# Patient Record
Sex: Male | Born: 1940
Health system: Southern US, Community
[De-identification: ages and names within clinical notes are randomized; demographics above are authoritative.]

## PROBLEM LIST (undated history)

## (undated) DIAGNOSIS — E785 Hyperlipidemia, unspecified: Secondary | ICD-10-CM

## (undated) DIAGNOSIS — I2510 Atherosclerotic heart disease of native coronary artery without angina pectoris: Secondary | ICD-10-CM

## (undated) DIAGNOSIS — Z9221 Personal history of antineoplastic chemotherapy: Secondary | ICD-10-CM

## (undated) DIAGNOSIS — Z9289 Personal history of other medical treatment: Secondary | ICD-10-CM

## (undated) DIAGNOSIS — C3490 Malignant neoplasm of unspecified part of unspecified bronchus or lung: Secondary | ICD-10-CM

## (undated) DIAGNOSIS — Z923 Personal history of irradiation: Secondary | ICD-10-CM

## (undated) DIAGNOSIS — J439 Emphysema, unspecified: Secondary | ICD-10-CM

## (undated) DIAGNOSIS — Z8551 Personal history of malignant neoplasm of bladder: Secondary | ICD-10-CM

## (undated) DIAGNOSIS — M1990 Unspecified osteoarthritis, unspecified site: Secondary | ICD-10-CM

## (undated) HISTORY — PX: OTHER SURGICAL HISTORY: SHX169

## (undated) HISTORY — PX: SHOULDER SURGERY: SHX246

## (undated) HISTORY — DX: Emphysema, unspecified: J43.9

## (undated) HISTORY — PX: MINOR HEMORRHOIDECTOMY: SHX6238

---

## 2003-11-16 DIAGNOSIS — C4492 Squamous cell carcinoma of skin, unspecified: Secondary | ICD-10-CM

## 2003-11-16 HISTORY — DX: Squamous cell carcinoma of skin, unspecified: C44.92

## 2004-05-28 DIAGNOSIS — C4492 Squamous cell carcinoma of skin, unspecified: Secondary | ICD-10-CM

## 2004-05-28 HISTORY — DX: Squamous cell carcinoma of skin, unspecified: C44.92

## 2006-05-13 ENCOUNTER — Encounter (INDEPENDENT_AMBULATORY_CARE_PROVIDER_SITE_OTHER): Payer: Self-pay | Admitting: *Deleted

## 2006-05-13 ENCOUNTER — Ambulatory Visit (HOSPITAL_BASED_OUTPATIENT_CLINIC_OR_DEPARTMENT_OTHER): Admission: RE | Admit: 2006-05-13 | Discharge: 2006-05-13 | Payer: Self-pay | Admitting: Orthopedic Surgery

## 2009-09-26 ENCOUNTER — Encounter: Admission: RE | Admit: 2009-09-26 | Discharge: 2009-09-26 | Payer: Self-pay | Admitting: Internal Medicine

## 2010-05-04 ENCOUNTER — Encounter: Payer: Self-pay | Admitting: Internal Medicine

## 2010-08-29 NOTE — Op Note (Signed)
NAME:  Kevin [name], Kevin [name]              ACCOUNT NO.:  [id number]   MEDICAL RECORD NO.:  [id number]          PATIENT TYPE:  AMB   LOCATION:  DSC                          FACILITY:  [facility]   PHYSICIAN:  Katy Fitch. Sypher, M.D. DATE OF BIRTH:  10-Nov-1940   DATE OF PROCEDURE:  05/13/2006  DATE OF DISCHARGE:                               OPERATIVE REPORT   PREOPERATIVE DIAGNOSIS:  Progressive Dupuytren's contracture involving  left hand in the palm, in the pretendinous fibers to the small finger  and extensive nodular disease of the small finger, the thumb-index web  space and pretendinous fibers to the index finger.   POSTOPERATIVE DIAGNOSIS:  Progressive Dupuytren's contracture involving  left hand in the palm, in the pretendinous fibers to the small finger  and extensive nodular disease of the small finger, the thumb-index web  space and pretendinous fibers to the index finger.   OPERATION:  1. Resection of complex Dupuytren's contracture from left palm and      small finger.  2. Resection of Dupuytren's contracture from left thumb-index webspace      with release of the index finger pretendinous fibers.   OPERATING SURGEON:  Katy Fitch. Sypher, M.D.   ASSISTANT:  Annye Rusk, P.A.-C.   ANESTHESIA:  General by LMA.   SUPERVISING ANESTHESIOLOGIST:  Zenon Mayo, M.D.   INDICATIONS:  Kevin [name] is a 70 year old right-hand-dominant  gentleman referred by Dr. Celine Mans of [place], [place].   He has a history of progressive Dupuytren's palmar fibromatosis  affecting both hands, left worse than right.  He had an extensive  involvement of the pretendinous fibers and nodular disease of the left  palm and small finger and a thumb-index web space contracture with  increased tension in the natatory ligaments and the pretendinous fibers  to his left index finger.   He sought a Hand Surgery consult and requested resection of his  pathologic fascia.   Preoperatively, he was  carefully advised that this is a palliative  surgery.  We do not have the ability to control the biology of  Dupuytren's disease with surgery.   The understands he may have difficult keloid scars following surgery,  could have a sympathetic dystrophy or regional pain-type syndrome  response and may require prolonged therapy.  He understands he will need  to wear a splint for 8 weeks minimum postoperatively while sleeping  after surgery.  Preoperatively, questions were invited and answered.  He  is brought to the operating at this time.   PROCEDURE:  Kevin [name] was brought to the operating room and placed  in a supine position upon the operating table.   Following the induction of general anesthesia by LMA technique, the left  arm was prepped with Betadine soap and solution and sterilely draped.  One gram of Ancef was provided as a prophylactic antibiotic  preoperatively in the holding area.   The left arm was prepped with Betadine soap and solution and sterilely  draped.  Following exsanguination of the left arm with an Esmarch  bandage, an arterial tourniquet on the proximal brachium was inflated to  240  mmHg.  The procedure commenced with planning a Brunner zigzag  incision in the palm and small finger and in the thumb-index webspace, a  transverse incision was fashioned at the midsection of the web.   The skin incisions were taken sharply and skin flaps elevated,  separating the dermis from the pathologic fascia.   After elevation of skin flaps, the complex palmar fibromatosis pathology  was removed from the palm and small finger including resection of the  pretendinous fibers, a lateral fascial sheet on the ulnar aspect of the  small finger, nodular disease and a spiral band that was retrovascular  on the ulnar aspect of the small finger and a central cord distally.  The natatory ligaments were resected between the small finger and ring  finger.   After the fascia was  resected, the MP joint could be hyperextended 30  degrees and the PIP and DIP joint extended to neutral.  There were no  apparent complications.  There were no impairments of the flexor sheath  noted.   Hemostasis was achieved with bipolar cautery and direct compression.  Attention was then directed to the thumb-index webspace.  A transverse  incision was fashioned at the web space, followed by elevation of skin  flaps.  The natatory ligaments were resected and the pretendinous fibers  to the index finger were released.  This relieved the thumb-index web  space contracture.   The wound was then inspected for bleeding points, which were  electrocauterized with bipolar current followed by repair of the skin  with an intradermal 4-0 Prolene at the thumb-index web space and corner  sutures and mattress sutures of 5-0 nylon in the small finger and palm.   There were no apparent complications.   Kevin [name] tolerated surgery and anesthesia well.  He was awakened from  his anesthesia and transferred to the recovery room with stable signs.  He will be discharged with prescriptions for Percocet 5 mg one or two  tablets p.o. q.4-6 h. p.r.n. pain, 30 tablets without refill, also  Keflex 500 mg one p.o. q.8 h. for 4 days as a prophylactic antibiotic.     Dr. Jiles Harold I [place].      Katy Fitch Sypher, M.D.  Electronically Signed     RVS/MEDQ  D:  05/13/2006  T:  05/13/2006  Job:  [id number]   cc:   Jonita Albee, [place] Dr. Celine Mans

## 2012-04-13 HISTORY — PX: TRANSURETHRAL RESECTION OF BLADDER TUMOR: SHX2575

## 2013-09-13 ENCOUNTER — Other Ambulatory Visit: Payer: Self-pay | Admitting: Dermatology

## 2013-12-08 ENCOUNTER — Encounter (INDEPENDENT_AMBULATORY_CARE_PROVIDER_SITE_OTHER): Payer: Self-pay | Admitting: *Deleted

## 2013-12-12 ENCOUNTER — Encounter (INDEPENDENT_AMBULATORY_CARE_PROVIDER_SITE_OTHER): Payer: Self-pay

## 2013-12-13 ENCOUNTER — Telehealth (INDEPENDENT_AMBULATORY_CARE_PROVIDER_SITE_OTHER): Payer: Self-pay | Admitting: *Deleted

## 2013-12-13 NOTE — Telephone Encounter (Signed)
If he is not having any symptoms, he can delay next exam until 06/02/2016

## 2013-12-13 NOTE — Telephone Encounter (Signed)
Patient last TCS was 12/2006, had tubular adenoma removed (both are scnnaed in EPIC)-- he was on my recall and I mailed him letter asking him to call to schedule repeat TCS, patient states Dr Woody Seller told him he wouldn't need one for 10 years after that one -- tried to explain to patient with that type of polyp he needed to have it repeated in 5-7 years --  He wants you to review this again and let him know when he is really due -- very difficult to talk to

## 2013-12-14 NOTE — Telephone Encounter (Signed)
Spoke to patient, he isn't having any problems at this time and wants to put off to 2018, advised if he developes issues to contact his PCP

## 2016-08-04 ENCOUNTER — Ambulatory Visit (INDEPENDENT_AMBULATORY_CARE_PROVIDER_SITE_OTHER): Payer: Medicare Other | Admitting: Urology

## 2016-08-04 DIAGNOSIS — C678 Malignant neoplasm of overlapping sites of bladder: Secondary | ICD-10-CM

## 2016-08-04 DIAGNOSIS — N401 Enlarged prostate with lower urinary tract symptoms: Secondary | ICD-10-CM

## 2016-10-06 ENCOUNTER — Other Ambulatory Visit: Payer: Self-pay | Admitting: Dermatology

## 2016-11-18 ENCOUNTER — Other Ambulatory Visit: Payer: Self-pay | Admitting: Internal Medicine

## 2016-11-18 DIAGNOSIS — G8929 Other chronic pain: Secondary | ICD-10-CM

## 2016-11-18 DIAGNOSIS — M545 Low back pain: Principal | ICD-10-CM

## 2016-11-25 ENCOUNTER — Ambulatory Visit
Admission: RE | Admit: 2016-11-25 | Discharge: 2016-11-25 | Disposition: A | Discharge status: Home / Self Care | Payer: Medicare Other | Source: Ambulatory Visit | Attending: Internal Medicine | Admitting: Internal Medicine

## 2016-11-25 ENCOUNTER — Other Ambulatory Visit: Payer: Self-pay | Admitting: Internal Medicine

## 2016-11-25 DIAGNOSIS — M545 Low back pain: Principal | ICD-10-CM

## 2016-11-25 DIAGNOSIS — G8929 Other chronic pain: Secondary | ICD-10-CM

## 2016-11-25 MED ORDER — METHYLPREDNISOLONE ACETATE 40 MG/ML INJ SUSP (RADIOLOG
120.0000 mg | Freq: Once | INTRAMUSCULAR | Status: AC
  Administered 2016-11-25: 120 mg via EPIDURAL

## 2016-11-25 MED ORDER — IOPAMIDOL (ISOVUE-M 200) INJECTION 41%
1.0000 mL | Freq: Once | INTRAMUSCULAR | Status: AC
  Administered 2016-11-25: 1 mL via EPIDURAL

## 2016-11-25 NOTE — Discharge Instructions (Signed)

## 2016-12-04 ENCOUNTER — Encounter (INDEPENDENT_AMBULATORY_CARE_PROVIDER_SITE_OTHER): Payer: Self-pay | Admitting: *Deleted

## 2017-02-02 ENCOUNTER — Ambulatory Visit (INDEPENDENT_AMBULATORY_CARE_PROVIDER_SITE_OTHER): Payer: Medicare Other | Admitting: Urology

## 2017-02-02 DIAGNOSIS — C678 Malignant neoplasm of overlapping sites of bladder: Secondary | ICD-10-CM | POA: Diagnosis not present

## 2017-02-02 DIAGNOSIS — R351 Nocturia: Secondary | ICD-10-CM | POA: Diagnosis not present

## 2017-02-02 DIAGNOSIS — N401 Enlarged prostate with lower urinary tract symptoms: Secondary | ICD-10-CM

## 2017-04-26 DIAGNOSIS — R69 Illness, unspecified: Secondary | ICD-10-CM | POA: Diagnosis not present

## 2017-05-10 DIAGNOSIS — M159 Polyosteoarthritis, unspecified: Secondary | ICD-10-CM | POA: Diagnosis not present

## 2017-05-10 DIAGNOSIS — E7800 Pure hypercholesterolemia, unspecified: Secondary | ICD-10-CM | POA: Diagnosis not present

## 2017-06-09 DIAGNOSIS — E7800 Pure hypercholesterolemia, unspecified: Secondary | ICD-10-CM | POA: Diagnosis not present

## 2017-06-09 DIAGNOSIS — M159 Polyosteoarthritis, unspecified: Secondary | ICD-10-CM | POA: Diagnosis not present

## 2017-06-24 DIAGNOSIS — E7800 Pure hypercholesterolemia, unspecified: Secondary | ICD-10-CM | POA: Diagnosis not present

## 2017-06-24 DIAGNOSIS — Z1331 Encounter for screening for depression: Secondary | ICD-10-CM | POA: Diagnosis not present

## 2017-06-24 DIAGNOSIS — Z0000 Encounter for general adult medical examination without abnormal findings: Secondary | ICD-10-CM | POA: Diagnosis not present

## 2017-06-24 DIAGNOSIS — C679 Malignant neoplasm of bladder, unspecified: Secondary | ICD-10-CM | POA: Diagnosis not present

## 2017-06-24 DIAGNOSIS — Z6828 Body mass index (BMI) 28.0-28.9, adult: Secondary | ICD-10-CM | POA: Diagnosis not present

## 2017-06-24 DIAGNOSIS — Z1211 Encounter for screening for malignant neoplasm of colon: Secondary | ICD-10-CM | POA: Diagnosis not present

## 2017-06-24 DIAGNOSIS — Z79899 Other long term (current) drug therapy: Secondary | ICD-10-CM | POA: Diagnosis not present

## 2017-06-24 DIAGNOSIS — Z1339 Encounter for screening examination for other mental health and behavioral disorders: Secondary | ICD-10-CM | POA: Diagnosis not present

## 2017-06-24 DIAGNOSIS — Z299 Encounter for prophylactic measures, unspecified: Secondary | ICD-10-CM | POA: Diagnosis not present

## 2017-06-24 DIAGNOSIS — N400 Enlarged prostate without lower urinary tract symptoms: Secondary | ICD-10-CM | POA: Diagnosis not present

## 2017-06-24 DIAGNOSIS — Z7189 Other specified counseling: Secondary | ICD-10-CM | POA: Diagnosis not present

## 2017-06-24 DIAGNOSIS — R5383 Other fatigue: Secondary | ICD-10-CM | POA: Diagnosis not present

## 2017-06-28 DIAGNOSIS — Z87891 Personal history of nicotine dependence: Secondary | ICD-10-CM | POA: Diagnosis not present

## 2017-06-28 DIAGNOSIS — N400 Enlarged prostate without lower urinary tract symptoms: Secondary | ICD-10-CM | POA: Diagnosis not present

## 2017-06-28 DIAGNOSIS — H6120 Impacted cerumen, unspecified ear: Secondary | ICD-10-CM | POA: Diagnosis not present

## 2017-06-28 DIAGNOSIS — Z299 Encounter for prophylactic measures, unspecified: Secondary | ICD-10-CM | POA: Diagnosis not present

## 2017-06-28 DIAGNOSIS — Z6828 Body mass index (BMI) 28.0-28.9, adult: Secondary | ICD-10-CM | POA: Diagnosis not present

## 2017-07-05 DIAGNOSIS — H6120 Impacted cerumen, unspecified ear: Secondary | ICD-10-CM | POA: Diagnosis not present

## 2017-07-05 DIAGNOSIS — Z6828 Body mass index (BMI) 28.0-28.9, adult: Secondary | ICD-10-CM | POA: Diagnosis not present

## 2017-07-05 DIAGNOSIS — Z299 Encounter for prophylactic measures, unspecified: Secondary | ICD-10-CM | POA: Diagnosis not present

## 2017-07-20 ENCOUNTER — Ambulatory Visit (INDEPENDENT_AMBULATORY_CARE_PROVIDER_SITE_OTHER): Payer: Medicare HMO | Admitting: Urology

## 2017-07-20 DIAGNOSIS — R351 Nocturia: Secondary | ICD-10-CM

## 2017-07-20 DIAGNOSIS — N401 Enlarged prostate with lower urinary tract symptoms: Secondary | ICD-10-CM

## 2017-07-20 DIAGNOSIS — C678 Malignant neoplasm of overlapping sites of bladder: Secondary | ICD-10-CM | POA: Diagnosis not present

## 2017-08-03 DIAGNOSIS — M159 Polyosteoarthritis, unspecified: Secondary | ICD-10-CM | POA: Diagnosis not present

## 2017-08-03 DIAGNOSIS — E7800 Pure hypercholesterolemia, unspecified: Secondary | ICD-10-CM | POA: Diagnosis not present

## 2017-08-30 DIAGNOSIS — N401 Enlarged prostate with lower urinary tract symptoms: Secondary | ICD-10-CM | POA: Diagnosis not present

## 2017-08-30 DIAGNOSIS — R351 Nocturia: Secondary | ICD-10-CM | POA: Diagnosis not present

## 2017-09-07 DIAGNOSIS — M159 Polyosteoarthritis, unspecified: Secondary | ICD-10-CM | POA: Diagnosis not present

## 2017-09-07 DIAGNOSIS — E7800 Pure hypercholesterolemia, unspecified: Secondary | ICD-10-CM | POA: Diagnosis not present

## 2017-09-20 DIAGNOSIS — L821 Other seborrheic keratosis: Secondary | ICD-10-CM | POA: Diagnosis not present

## 2017-09-20 DIAGNOSIS — D229 Melanocytic nevi, unspecified: Secondary | ICD-10-CM | POA: Diagnosis not present

## 2017-10-08 DIAGNOSIS — E7800 Pure hypercholesterolemia, unspecified: Secondary | ICD-10-CM | POA: Diagnosis not present

## 2017-10-08 DIAGNOSIS — M159 Polyosteoarthritis, unspecified: Secondary | ICD-10-CM | POA: Diagnosis not present

## 2017-10-12 ENCOUNTER — Ambulatory Visit (INDEPENDENT_AMBULATORY_CARE_PROVIDER_SITE_OTHER): Payer: Medicare HMO | Admitting: Urology

## 2017-10-12 DIAGNOSIS — N400 Enlarged prostate without lower urinary tract symptoms: Secondary | ICD-10-CM

## 2017-11-05 DIAGNOSIS — M159 Polyosteoarthritis, unspecified: Secondary | ICD-10-CM | POA: Diagnosis not present

## 2017-11-05 DIAGNOSIS — E7800 Pure hypercholesterolemia, unspecified: Secondary | ICD-10-CM | POA: Diagnosis not present

## 2017-11-08 DIAGNOSIS — E7800 Pure hypercholesterolemia, unspecified: Secondary | ICD-10-CM | POA: Diagnosis not present

## 2017-11-08 DIAGNOSIS — Z6827 Body mass index (BMI) 27.0-27.9, adult: Secondary | ICD-10-CM | POA: Diagnosis not present

## 2017-11-08 DIAGNOSIS — Z299 Encounter for prophylactic measures, unspecified: Secondary | ICD-10-CM | POA: Diagnosis not present

## 2017-11-08 DIAGNOSIS — Z713 Dietary counseling and surveillance: Secondary | ICD-10-CM | POA: Diagnosis not present

## 2017-11-08 DIAGNOSIS — C679 Malignant neoplasm of bladder, unspecified: Secondary | ICD-10-CM | POA: Diagnosis not present

## 2017-12-02 DIAGNOSIS — E7800 Pure hypercholesterolemia, unspecified: Secondary | ICD-10-CM | POA: Diagnosis not present

## 2017-12-02 DIAGNOSIS — M159 Polyosteoarthritis, unspecified: Secondary | ICD-10-CM | POA: Diagnosis not present

## 2017-12-09 DIAGNOSIS — Z713 Dietary counseling and surveillance: Secondary | ICD-10-CM | POA: Diagnosis not present

## 2017-12-09 DIAGNOSIS — M79672 Pain in left foot: Secondary | ICD-10-CM | POA: Diagnosis not present

## 2017-12-09 DIAGNOSIS — Z299 Encounter for prophylactic measures, unspecified: Secondary | ICD-10-CM | POA: Diagnosis not present

## 2017-12-09 DIAGNOSIS — Z6827 Body mass index (BMI) 27.0-27.9, adult: Secondary | ICD-10-CM | POA: Diagnosis not present

## 2017-12-28 DIAGNOSIS — M159 Polyosteoarthritis, unspecified: Secondary | ICD-10-CM | POA: Diagnosis not present

## 2017-12-28 DIAGNOSIS — E7800 Pure hypercholesterolemia, unspecified: Secondary | ICD-10-CM | POA: Diagnosis not present

## 2018-01-17 DIAGNOSIS — R69 Illness, unspecified: Secondary | ICD-10-CM | POA: Diagnosis not present

## 2018-01-28 DIAGNOSIS — E7800 Pure hypercholesterolemia, unspecified: Secondary | ICD-10-CM | POA: Diagnosis not present

## 2018-01-28 DIAGNOSIS — M159 Polyosteoarthritis, unspecified: Secondary | ICD-10-CM | POA: Diagnosis not present

## 2018-02-03 DIAGNOSIS — M549 Dorsalgia, unspecified: Secondary | ICD-10-CM | POA: Diagnosis not present

## 2018-02-03 DIAGNOSIS — Z299 Encounter for prophylactic measures, unspecified: Secondary | ICD-10-CM | POA: Diagnosis not present

## 2018-02-03 DIAGNOSIS — Z713 Dietary counseling and surveillance: Secondary | ICD-10-CM | POA: Diagnosis not present

## 2018-02-03 DIAGNOSIS — Z6828 Body mass index (BMI) 28.0-28.9, adult: Secondary | ICD-10-CM | POA: Diagnosis not present

## 2018-03-08 DIAGNOSIS — E7800 Pure hypercholesterolemia, unspecified: Secondary | ICD-10-CM | POA: Diagnosis not present

## 2018-03-08 DIAGNOSIS — M159 Polyosteoarthritis, unspecified: Secondary | ICD-10-CM | POA: Diagnosis not present

## 2018-04-07 DIAGNOSIS — E7800 Pure hypercholesterolemia, unspecified: Secondary | ICD-10-CM | POA: Diagnosis not present

## 2018-04-07 DIAGNOSIS — M159 Polyosteoarthritis, unspecified: Secondary | ICD-10-CM | POA: Diagnosis not present

## 2018-04-14 DIAGNOSIS — Z6827 Body mass index (BMI) 27.0-27.9, adult: Secondary | ICD-10-CM | POA: Diagnosis not present

## 2018-04-14 DIAGNOSIS — J320 Chronic maxillary sinusitis: Secondary | ICD-10-CM | POA: Diagnosis not present

## 2018-04-14 DIAGNOSIS — Z299 Encounter for prophylactic measures, unspecified: Secondary | ICD-10-CM | POA: Diagnosis not present

## 2018-04-26 ENCOUNTER — Ambulatory Visit (INDEPENDENT_AMBULATORY_CARE_PROVIDER_SITE_OTHER): Payer: Medicare HMO | Admitting: Urology

## 2018-04-26 DIAGNOSIS — N401 Enlarged prostate with lower urinary tract symptoms: Secondary | ICD-10-CM | POA: Diagnosis not present

## 2018-04-26 DIAGNOSIS — C679 Malignant neoplasm of bladder, unspecified: Secondary | ICD-10-CM | POA: Diagnosis not present

## 2018-04-26 DIAGNOSIS — R351 Nocturia: Secondary | ICD-10-CM | POA: Diagnosis not present

## 2018-05-05 DIAGNOSIS — M159 Polyosteoarthritis, unspecified: Secondary | ICD-10-CM | POA: Diagnosis not present

## 2018-05-05 DIAGNOSIS — E7800 Pure hypercholesterolemia, unspecified: Secondary | ICD-10-CM | POA: Diagnosis not present

## 2018-05-09 DIAGNOSIS — J309 Allergic rhinitis, unspecified: Secondary | ICD-10-CM | POA: Diagnosis not present

## 2018-05-09 DIAGNOSIS — Z299 Encounter for prophylactic measures, unspecified: Secondary | ICD-10-CM | POA: Diagnosis not present

## 2018-05-09 DIAGNOSIS — Z87891 Personal history of nicotine dependence: Secondary | ICD-10-CM | POA: Diagnosis not present

## 2018-05-09 DIAGNOSIS — E7800 Pure hypercholesterolemia, unspecified: Secondary | ICD-10-CM | POA: Diagnosis not present

## 2018-05-09 DIAGNOSIS — Z6827 Body mass index (BMI) 27.0-27.9, adult: Secondary | ICD-10-CM | POA: Diagnosis not present

## 2018-05-09 DIAGNOSIS — C679 Malignant neoplasm of bladder, unspecified: Secondary | ICD-10-CM | POA: Diagnosis not present

## 2018-05-11 ENCOUNTER — Other Ambulatory Visit: Payer: Self-pay | Admitting: Urology

## 2018-05-13 NOTE — Patient Instructions (Signed)
Your procedure is scheduled on: 05/19/2018  Report to [facility] at    6;30 AM.  Call this number if you have problems the morning of surgery: [phone]   Remember:   Do not drink or eat food:After Midnight.  :  Take these medicines the morning of surgery with A SIP OF WATER: Claritin   Do not wear jewelry, make-up or nail polish.  Do not wear lotions, powders, or perfumes. You may wear deodorant.  Do not shave 48 hours prior to surgery. Men may shave face and neck.  Do not bring valuables to the hospital.  Contacts, dentures or bridgework may not be worn into surgery.  Leave suitcase in the car. After surgery it may be brought to your room.  For patients admitted to the hospital, checkout time is 11:00 AM the day of discharge.   Patients discharged the day of surgery will not be allowed to drive home.    Special Instructions: Shower using CHG night before surgery and shower the day of surgery use CHG.  Use special wash - you have one bottle of CHG for all showers.  You should use approximately 1/2 of the bottle for each shower.  Transurethral Resection of Bladder Tumor  Transurethral resection of a bladder tumor is the removal (resection) of a cancerous growth (tumor) on the inside wall of the bladder. The bladder is the organ that holds urine. The tumor is removed through the tube that carries urine out of the body (urethra). In a transurethral resection, a thin telescope with a light, a tiny camera, and an electric cutting edge (resectoscope) is passed through the urethra. In men, the opening of the urethra is at the end of the penis. In women, it is just above the opening of the vagina. Tell a health care provider about:  Any allergies you have.  All medicines you are taking, including vitamins, herbs, eye drops, creams, and over-the-counter medicines.  Any problems you or family members have had with anesthetic medicines.  Any blood disorders you have.  Any surgeries you  have had.  Any medical conditions you have.  Any recent urinary tract infections you have had.  Whether you are pregnant or may be pregnant. What are the risks? Generally, this is a safe procedure. However, problems may occur, including:  Infection.  Bleeding.  Allergic reactions to medicines.  Damage to nearby structures or organs, such as: ? The urethra. ? The tubes that drain urine from the kidneys into the bladder (ureters).  Pain and burning during urination.  Difficulty urinating due to partial blockage of the urethra.  Inability to urinate (urinary retention). What happens before the procedure? Staying hydrated Follow instructions from your health care provider about hydration, which may include:  Up to 2 hours before the procedure - you may continue to drink clear liquids, such as water, clear fruit juice, black coffee, and plain tea.  Eating and drinking restrictions Follow instructions from your health care provider about eating and drinking, which may include:  8 hours before the procedure - stop eating heavy meals or foods, such as meat, fried foods, or fatty foods.  6 hours before the procedure - stop eating light meals or foods, such as toast or cereal.  6 hours before the procedure - stop drinking milk or drinks that contain milk.  2 hours before the procedure - stop drinking clear liquids. Medicines Ask your health care provider about:  Changing or stopping your regular medicines. This is especially  important if you are taking diabetes medicines or blood thinners.  Taking medicines such as aspirin and ibuprofen. These medicines can thin your blood. Do not take these medicines unless your health care provider tells you to take them.  Taking over-the-counter medicines, vitamins, herbs, and supplements. Tests You may have exams or tests, including:  Physical exam.  Blood tests.  Urine tests.  Electrocardiogram (ECG). This test measures the  electrical activity of the heart. General instructions  Plan to have someone take you home from the hospital or clinic.  Ask your health care provider how your surgical site will be marked or identified.  Ask your health care provider what steps will be taken to help prevent infection. These may include: ? Washing skin with a germ-killing soap. ? Taking antibiotic medicine. What happens during the procedure?  An IV will be inserted into one of your veins.  You will be given one or more of the following: ? A medicine to help you relax (sedative). ? A medicine to make you fall asleep (general anesthetic). ? A medicine that is injected into your spine to numb the area below and slightly above the injection site (spinal anesthetic).  Your legs will be placed in foot rests (stirrups) so that your legs are apart and your knees are bent.  The resectoscope will be passed through your urethra and into your bladder.  The part of your bladder that is affected by the tumor will be resected using the cutting edge of the resectoscope.  The resectoscope will be removed.  A thin, flexible tube (catheter) will be passed through your urethra and into your bladder. The catheter will drain urine into a bag outside of your body. ? Fluid may be passed through the catheter to keep the catheter open. The procedure may vary among health care providers and hospitals. What happens after the procedure?  Your blood pressure, heart rate, breathing rate, and blood oxygen level will be monitored until you leave the hospital or clinic.  You may continue to receive fluids and medicines through an IV.  You will have some pain. You will be given pain medicine to relieve pain.  You will have a catheter to drain your urine. ? You will have blood in your urine. Your catheter may be kept in until your urine is clear. ? The amount of urine will be monitored. If necessary, your bladder may be rinsed out (irrigated) by  passing fluid through your catheter.  You will be encouraged to walk around as soon as possible.  You may have to wear compression stockings. These stockings help to prevent blood clots and reduce swelling in your legs.  Do not drive for 24 hours if you were given a sedative during your procedure. Summary  Transurethral resection of a bladder tumor is the removal (resection) of a cancerous growth (tumor) on the inside wall of the bladder.  To do this procedure, your health care provider uses a thin telescope with a light, a tiny camera, and an electric cutting edge (resectoscope).  Follow your health care provider's instructions. You may need to stop or change certain medicines, and you may be told to stop eating and drinking several hours before the procedure.  Your blood pressure, heart rate, breathing rate, and blood oxygen level will be monitored until you leave the hospital or clinic.  You may have to wear compression stockings. These stockings help to prevent blood clots and reduce swelling in your legs. This information is not intended  to replace advice given to you by your health care provider. Make sure you discuss any questions you have with your health care provider. Document Released: 01/24/2009 Document Revised: 10/29/2017 Document Reviewed: 10/29/2017 Elsevier Interactive Patient Education  2019 [employer]  Transurethral Resection of Bladder Tumor, Care After This sheet gives you information about how to care for yourself after your procedure. Your health care provider may also give you more specific instructions. If you have problems or questions, contact your health care provider. What can I expect after the procedure? After the procedure, it is common to have:  A small amount of blood in your urine for up to 2 weeks.  Soreness or mild pain from your catheter. After your catheter is removed, you may have mild soreness, especially when urinating.  Pain in your lower  abdomen. Follow these instructions at home: Medicines   Take over-the-counter and prescription medicines only as told by your health care provider.  If you were prescribed an antibiotic medicine, take it as told by your health care provider. Do not stop taking the antibiotic even if you start to feel better.  Do not drive for 24 hours if you were given a sedative during your procedure.  Ask your health care provider if the medicine prescribed to you: ? Requires you to avoid driving or using heavy machinery. ? Can cause constipation. You may need to take these actions to prevent or treat constipation:  Take over-the-counter or prescription medicines.  Eat foods that are high in fiber, such as beans, whole grains, and fresh fruits and vegetables.  Limit foods that are high in fat and processed sugars, such as fried or sweet foods. Activity  Return to your normal activities as told by your health care provider. Ask your health care provider what activities are safe for you.  Do not lift anything that is heavier than 10 lb (4.5 kg), or the limit that you are told, until your health care provider says that it is safe.  Avoid intense physical activity for as long as told by your health care provider.  Rest as told by your health care provider.  Avoid sitting for a long time without moving. Get up to take short walks every 1-2 hours. This is important to improve blood flow and breathing. Ask for help if you feel weak or unsteady. General instructions   Do not drink alcohol for as long as told by your health care provider. This is especially important if you are taking prescription pain medicines.  Do not take baths, swim, or use a hot tub until your health care provider approves. Ask your health care provider if you may take showers. You may only be allowed to take sponge baths.  If you have a catheter, follow instructions from your health care provider about caring for your catheter  and your drainage bag.  Drink enough fluid to keep your urine pale yellow.  Wear compression stockings as told by your health care provider. These stockings help to prevent blood clots and reduce swelling in your legs.  Keep all follow-up visits as told by your health care provider. This is important. ? You will need to be followed closely with regular checks of your bladder and urethra (cystoscopies) to make sure that the cancer does not come back. Contact a health care provider if:  You have pain that gets worse or does not improve with medicine.  You have blood in your urine for more than 2 weeks.  You  have cloudy or bad-smelling urine.  You become constipated. Signs of constipation may include having: ? Fewer than three bowel movements in a week. ? Difficulty having a bowel movement. ? Stools that are dry, hard, or larger than normal.  You have a fever. Get help right away if:  You have: ? Severe pain. ? Bright red blood in your urine. ? Blood clots in your urine. ? A lot of blood in your urine.  Your catheter has been removed and you are not able to urinate.  You have a catheter in place and the catheter is not draining urine. Summary  After your procedure, it is common to have a small amount of blood in your urine, soreness or mild pain from your catheter, and pain in your lower abdomen.  Take over-the-counter and prescription medicines only as told by your health care provider.  Rest as told by your health care provider. Follow your health care provider's instructions about returning to normal activities. Ask what activities are safe for you.  If you have a catheter, follow instructions from your health care provider about caring for your catheter and your drainage bag.  Get help right away if you cannot urinate, you have severe pain, or you have bright red blood or blood clots in your urine. This information is not intended to replace advice given to you by your  health care provider. Make sure you discuss any questions you have with your health care provider. Document Released: 03/11/2015 Document Revised: 10/28/2017 Document Reviewed: 10/28/2017  General Anesthesia, Adult, Care After This sheet gives you information about how to care for yourself after your procedure. Your health care provider may also give you more specific instructions. If you have problems or questions, contact your health care provider. What can I expect after the procedure? After the procedure, the following side effects are common:  Pain or discomfort at the IV site.  Nausea.  Vomiting.  Sore throat.  Trouble concentrating.  Feeling cold or chills.  Weak or tired.  Sleepiness and fatigue.  Soreness and body aches. These side effects can affect parts of the body that were not involved in surgery. Follow these instructions at home:  For at least 24 hours after the procedure:  Have a responsible adult stay with you. It is important to have someone help care for you until you are awake and alert.  Rest as needed.  Do not: ? Participate in activities in which you could fall or become injured. ? Drive. ? Use heavy machinery. ? Drink alcohol. ? Take sleeping pills or medicines that cause drowsiness. ? Make important decisions or sign legal documents. ? Take care of children on your own. Eating and drinking  Follow any instructions from your health care provider about eating or drinking restrictions.  When you feel hungry, start by eating small amounts of foods that are soft and easy to digest (bland), such as toast. Gradually return to your regular diet.  Drink enough fluid to keep your urine pale yellow.  If you vomit, rehydrate by drinking water, juice, or clear broth. General instructions  If you have sleep apnea, surgery and certain medicines can increase your risk for breathing problems. Follow instructions from your health care provider about wearing  your sleep device: ? Anytime you are sleeping, including during daytime naps. ? While taking prescription pain medicines, sleeping medicines, or medicines that make you drowsy.  Return to your normal activities as told by your health care provider. Ask your  health care provider what activities are safe for you.  Take over-the-counter and prescription medicines only as told by your health care provider.  If you smoke, do not smoke without supervision.  Keep all follow-up visits as told by your health care provider. This is important. Contact a health care provider if:  You have nausea or vomiting that does not get better with medicine.  You cannot eat or drink without vomiting.  You have pain that does not get better with medicine.  You are unable to pass urine.  You develop a skin rash.  You have a fever.  You have redness around your IV site that gets worse. Get help right away if:  You have difficulty breathing.  You have chest pain.  You have blood in your urine or stool, or you vomit blood. Summary  After the procedure, it is common to have a sore throat or nausea. It is also common to feel tired.  Have a responsible adult stay with you for the first 24 hours after general anesthesia. It is important to have someone help care for you until you are awake and alert.  When you feel hungry, start by eating small amounts of foods that are soft and easy to digest (bland), such as toast. Gradually return to your regular diet.  Drink enough fluid to keep your urine pale yellow.  Return to your normal activities as told by your health care provider. Ask your health care provider what activities are safe for you. This information is not intended to replace advice given to you by your health care provider. Make sure you discuss any questions you have with your health care provider. Document Released: 07/06/2000 Document Revised: 11/13/2016 Document Reviewed: 11/13/2016 Elsevier  Interactive Patient Education  2019 [employer].  Chartered certified accountant Patient Education  [employer].

## 2018-05-17 ENCOUNTER — Other Ambulatory Visit: Payer: Self-pay

## 2018-05-17 ENCOUNTER — Encounter (HOSPITAL_COMMUNITY): Payer: Self-pay

## 2018-05-17 ENCOUNTER — Encounter (HOSPITAL_COMMUNITY)
Admission: RE | Admit: 2018-05-17 | Discharge: 2018-05-17 | Disposition: A | Discharge status: Home / Self Care | Payer: Medicare HMO | Source: Ambulatory Visit | Attending: Urology | Admitting: Urology

## 2018-05-17 DIAGNOSIS — C674 Malignant neoplasm of posterior wall of bladder: Secondary | ICD-10-CM | POA: Diagnosis not present

## 2018-05-17 DIAGNOSIS — Z8551 Personal history of malignant neoplasm of bladder: Secondary | ICD-10-CM | POA: Diagnosis not present

## 2018-05-17 DIAGNOSIS — Z01818 Encounter for other preprocedural examination: Secondary | ICD-10-CM | POA: Insufficient documentation

## 2018-05-17 DIAGNOSIS — D494 Neoplasm of unspecified behavior of bladder: Secondary | ICD-10-CM | POA: Diagnosis present

## 2018-05-17 DIAGNOSIS — E785 Hyperlipidemia, unspecified: Secondary | ICD-10-CM | POA: Diagnosis not present

## 2018-05-17 HISTORY — DX: Hyperlipidemia, unspecified: E78.5

## 2018-05-17 LAB — CBC WITH DIFFERENTIAL/PLATELET
ABS IMMATURE GRANULOCYTES: 0.06 10*3/uL (ref 0.00–0.07)
BASOS ABS: 0 10*3/uL (ref 0.0–0.1)
Basophils Relative: 1 %
EOS PCT: 4 %
Eosinophils Absolute: 0.3 10*3/uL (ref 0.0–0.5)
HEMATOCRIT: 44.3 % (ref 39.0–52.0)
Hemoglobin: 14.5 g/dL (ref 13.0–17.0)
Immature Granulocytes: 1 %
LYMPHS ABS: 1.7 10*3/uL (ref 0.7–4.0)
Lymphocytes Relative: 20 %
MCH: 31.8 pg (ref 26.0–34.0)
MCHC: 32.7 g/dL (ref 30.0–36.0)
MCV: 97.1 fL (ref 80.0–100.0)
Monocytes Absolute: 0.9 10*3/uL (ref 0.1–1.0)
Monocytes Relative: 11 %
NEUTROS ABS: 5.3 10*3/uL (ref 1.7–7.7)
NRBC: 0 % (ref 0.0–0.2)
Neutrophils Relative %: 63 %
Platelets: 223 10*3/uL (ref 150–400)
RBC: 4.56 MIL/uL (ref 4.22–5.81)
RDW: 12.9 % (ref 11.5–15.5)
WBC: 8.3 10*3/uL (ref 4.0–10.5)

## 2018-05-17 LAB — BASIC METABOLIC PANEL
ANION GAP: 8 (ref 5–15)
BUN: 21 mg/dL (ref 8–23)
CO2: 25 mmol/L (ref 22–32)
Calcium: 8.9 mg/dL (ref 8.9–10.3)
Chloride: 103 mmol/L (ref 98–111)
Creatinine, Ser: 1.2 mg/dL (ref 0.61–1.24)
GFR calc non Af Amer: 59 mL/min — ABNORMAL LOW (ref >60)
Glucose, Bld: 107 mg/dL — ABNORMAL HIGH (ref 70–99)
POTASSIUM: 3.6 mmol/L (ref 3.5–5.1)
SODIUM: 136 mmol/L (ref 135–145)

## 2018-05-18 NOTE — H&P (Signed)
H&P  Chief Complaint: Bladder tumor  History of Present Illness: This man presents for TURBT and placement of intravesical gemcitobine for mgmt of a recurrent BT,  Past Medical History:  Diagnosis Date  . Hyperlipidemia       Home Medications:  Allergies as of 05/18/2018   No Known Allergies     Medication List    Notice   Cannot display discharge medications because the patient has not yet been admitted.     Allergies: No Known Allergies  No family history on file.  Social History:  reports that he has never smoked. He has never used smokeless tobacco. He reports that he does not drink alcohol or use drugs.  ROS: A complete review of systems was performed.  All systems are negative except for pertinent findings as noted.  Physical Exam:  Vital signs in last 24 hours:   Constitutional:  Alert and oriented, No acute distress Cardiovascular: Regular rate  Respiratory: Normal respiratory effort GI: Abdomen is soft, nontender, nondistended, no abdominal masses. No CVAT.  Genitourinary: Normal male phallus, testes are descended bilaterally and non-tender and without masses, scrotum is normal in appearance without lesions or masses, perineum is normal on inspection. Lymphatic: No lymphadenopathy Neurologic: Grossly intact, no focal deficits Psychiatric: Normal mood and affect  Laboratory Data:  Recent Labs    05/17/18 1525  WBC 8.3  HGB 14.5  HCT 44.3  PLT 223    Recent Labs    05/17/18 1525  NA 136  K 3.6  CL 103  GLUCOSE 107*  BUN 21  CALCIUM 8.9  CREATININE 1.20     No results found for this or any previous visit (from the past 24 hour(s)). No results found for this or any previous visit (from the past 240 hour(s)).  Renal Function: Recent Labs    05/17/18 1525  CREATININE 1.20   Estimated Creatinine Clearance: 59.6 mL/min (by C-G formula based on SCr of 1.2 mg/dL).  Radiologic Imaging: No results found.  Impression/Assessment:  Recurrent  BT   Plan:  TURBT/intravesical gemcitobine

## 2018-05-19 ENCOUNTER — Encounter (HOSPITAL_COMMUNITY): Payer: Self-pay | Admitting: *Deleted

## 2018-05-19 ENCOUNTER — Encounter (HOSPITAL_COMMUNITY)
Admission: RE | Disposition: A | Discharge status: Home / Self Care | Payer: Self-pay | Source: Home / Self Care | Attending: Urology

## 2018-05-19 ENCOUNTER — Ambulatory Visit (HOSPITAL_COMMUNITY)
Admission: RE | Admit: 2018-05-19 | Discharge: 2018-05-19 | Disposition: A | Discharge status: Home / Self Care | Payer: Medicare HMO | Attending: Urology | Admitting: Urology

## 2018-05-19 ENCOUNTER — Ambulatory Visit (HOSPITAL_COMMUNITY): Payer: Medicare HMO | Admitting: Anesthesiology

## 2018-05-19 DIAGNOSIS — C674 Malignant neoplasm of posterior wall of bladder: Secondary | ICD-10-CM | POA: Diagnosis not present

## 2018-05-19 DIAGNOSIS — Z8551 Personal history of malignant neoplasm of bladder: Secondary | ICD-10-CM | POA: Diagnosis not present

## 2018-05-19 DIAGNOSIS — E785 Hyperlipidemia, unspecified: Secondary | ICD-10-CM | POA: Diagnosis not present

## 2018-05-19 DIAGNOSIS — C679 Malignant neoplasm of bladder, unspecified: Secondary | ICD-10-CM | POA: Diagnosis not present

## 2018-05-19 DIAGNOSIS — D494 Neoplasm of unspecified behavior of bladder: Secondary | ICD-10-CM | POA: Diagnosis not present

## 2018-05-19 HISTORY — PX: TRANSURETHRAL RESECTION OF BLADDER TUMOR: SHX2575

## 2018-05-19 SURGERY — TURBT (TRANSURETHRAL RESECTION OF BLADDER TUMOR)
Anesthesia: General | Site: Bladder

## 2018-05-19 MED ORDER — FENTANYL CITRATE (PF) 100 MCG/2ML IJ SOLN
INTRAMUSCULAR | Status: AC
  Filled 2018-05-19: qty 2

## 2018-05-19 MED ORDER — CEFAZOLIN SODIUM-DEXTROSE 2-4 GM/100ML-% IV SOLN
2.0000 g | INTRAVENOUS | Status: AC
  Administered 2018-05-19: 2 g via INTRAVENOUS
  Filled 2018-05-19: qty 100

## 2018-05-19 MED ORDER — KETOROLAC TROMETHAMINE 30 MG/ML IJ SOLN
30.0000 mg | Freq: Once | INTRAMUSCULAR | Status: DC | PRN
  Filled 2018-05-19: qty 1

## 2018-05-19 MED ORDER — FENTANYL CITRATE (PF) 100 MCG/2ML IJ SOLN
INTRAMUSCULAR | Status: DC | PRN
  Administered 2018-05-19 (×2): 25 ug via INTRAVENOUS

## 2018-05-19 MED ORDER — KETOROLAC TROMETHAMINE 30 MG/ML IJ SOLN
15.0000 mg | Freq: Once | INTRAMUSCULAR | Status: AC | PRN
  Administered 2018-05-19: 15 mg via INTRAVENOUS

## 2018-05-19 MED ORDER — PROPOFOL 10 MG/ML IV BOLUS
INTRAVENOUS | Status: AC
  Filled 2018-05-19: qty 20

## 2018-05-19 MED ORDER — ONDANSETRON HCL 4 MG/2ML IJ SOLN: 4.0000 mg | Freq: Once | INTRAMUSCULAR | Status: DC | PRN

## 2018-05-19 MED ORDER — STERILE WATER FOR IRRIGATION IR SOLN
Status: DC | PRN
  Administered 2018-05-19: 500 mL
  Administered 2018-05-19: 3000 mL

## 2018-05-19 MED ORDER — LIDOCAINE HCL 1 % IJ SOLN
INTRAMUSCULAR | Status: DC | PRN
  Administered 2018-05-19: 50 mg via INTRADERMAL

## 2018-05-19 MED ORDER — MIDAZOLAM HCL 2 MG/2ML IJ SOLN
2.0000 mg | INTRAMUSCULAR | Status: DC | PRN
  Administered 2018-05-19: 2 mg via INTRAVENOUS
  Filled 2018-05-19: qty 2

## 2018-05-19 MED ORDER — GEMCITABINE CHEMO FOR BLADDER INSTILLATION 2000 MG
2000.0000 mg | Freq: Once | INTRAVENOUS | Status: AC
  Administered 2018-05-19: 2000 mg via INTRAVESICAL
  Filled 2018-05-19 (×2): qty 52.6

## 2018-05-19 MED ORDER — HYDROCODONE-ACETAMINOPHEN 7.5-325 MG PO TABS: 1.0000 | ORAL_TABLET | Freq: Once | ORAL | Status: DC | PRN

## 2018-05-19 MED ORDER — MIDAZOLAM HCL 2 MG/2ML IJ SOLN
INTRAMUSCULAR | Status: AC
  Filled 2018-05-19: qty 2

## 2018-05-19 MED ORDER — MEPERIDINE HCL 50 MG/ML IJ SOLN: 6.2500 mg | INTRAMUSCULAR | Status: DC | PRN

## 2018-05-19 MED ORDER — PROPOFOL 10 MG/ML IV BOLUS
INTRAVENOUS | Status: DC | PRN
  Administered 2018-05-19: 100 mg via INTRAVENOUS

## 2018-05-19 MED ORDER — HYDROMORPHONE HCL 1 MG/ML IJ SOLN
0.2500 mg | INTRAMUSCULAR | Status: DC | PRN
  Administered 2018-05-19: 0.5 mg via INTRAVENOUS
  Filled 2018-05-19: qty 0.5

## 2018-05-19 MED ORDER — LACTATED RINGERS IV SOLN
INTRAVENOUS | Status: DC
  Administered 2018-05-19: 1000 mL via INTRAVENOUS

## 2018-05-19 MED ORDER — MIDAZOLAM HCL 5 MG/5ML IJ SOLN
INTRAMUSCULAR | Status: DC | PRN
  Administered 2018-05-19: 1 mg via INTRAVENOUS

## 2018-05-19 SURGICAL SUPPLY — 23 items
BAG DRAIN URO TABLE W/ADPT NS (BAG) ×3 IMPLANT
BAG DRN 8 ADPR NS SKTRN CSTL (BAG) ×1
BAG URINE LEG 500ML (DRAIN) ×3 IMPLANT
CATH FOLEY 2WAY SLVR  5CC 18FR (CATHETERS) ×2
CATH FOLEY 2WAY SLVR 5CC 18FR (CATHETERS) IMPLANT
CLOTH BEACON ORANGE TIMEOUT ST (SAFETY) ×3 IMPLANT
ELECT REM PT RETURN 9FT ADLT (ELECTROSURGICAL) ×3
ELECTRODE REM PT RTRN 9FT ADLT (ELECTROSURGICAL) ×1 IMPLANT
GLOVE BIO SURGEON STRL SZ8 (GLOVE) ×3 IMPLANT
GLOVE BIOGEL M 7.0 STRL (GLOVE) ×2 IMPLANT
GLOVE BIOGEL PI IND STRL 7.0 (GLOVE) ×2 IMPLANT
GLOVE BIOGEL PI INDICATOR 7.0 (GLOVE) ×4
GOWN STRL REUS W/TWL LRG LVL3 (GOWN DISPOSABLE) ×3 IMPLANT
GOWN STRL REUS W/TWL XL LVL3 (GOWN DISPOSABLE) ×3 IMPLANT
KIT CHEMO SPILL (MISCELLANEOUS) ×3 IMPLANT
KIT TURNOVER CYSTO (KITS) ×3 IMPLANT
LOOP MONOPOLAR YLW (ELECTROSURGICAL) ×3 IMPLANT
MANIFOLD NEPTUNE II (INSTRUMENTS) ×3 IMPLANT
PACK CYSTO (CUSTOM PROCEDURE TRAY) ×3 IMPLANT
PAD ARMBOARD 7.5X6 YLW CONV (MISCELLANEOUS) ×3 IMPLANT
PLUG CATH AND CAP STER (CATHETERS) ×3 IMPLANT
WATER STERILE IRR 1000ML POUR (IV SOLUTION) ×3 IMPLANT
WATER STERILE IRR 3000ML UROMA (IV SOLUTION) ×3 IMPLANT

## 2018-05-19 NOTE — Anesthesia Procedure Notes (Signed)
Procedure Name: LMA Insertion Date/Time: 05/19/2018 8:21 AM Performed by: Charmaine Downs, CRNA Pre-anesthesia Checklist: Patient identified, Patient being monitored, Emergency Drugs available, Timeout performed and Suction available Patient Re-evaluated:Patient Re-evaluated prior to induction Oxygen Delivery Method: Circle System Utilized Preoxygenation: Pre-oxygenation with 100% oxygen Induction Type: IV induction Ventilation: Mask ventilation without difficulty LMA: LMA inserted LMA Size: 4.0 Number of attempts: 1 Placement Confirmation: positive ETCO2 and breath sounds checked- equal and bilateral Tube secured with: Tape Dental Injury: Teeth and Oropharynx as per pre-operative assessment

## 2018-05-19 NOTE — Transfer of Care (Signed)
Immediate Anesthesia Transfer of Care Note  Patient: Chartered loss adjuster  Procedure(s) Performed: TRANSURETHRAL RESECTION OF BLADDER TUMOR (TURBT) AND (INTRAVESICAL GEMCITABINE INSTILLED IN BLADDER IN PACU)  (N/A Bladder)  Patient Location: PACU  Anesthesia Type:General  Level of Consciousness: drowsy and patient cooperative  Airway & Oxygen Therapy: Patient Spontanous Breathing  Post-op Assessment: Report given to RN, Post -op Vital signs reviewed and stable and Patient moving all extremities  Post vital signs: Reviewed and stable  Last Vitals:  Vitals Value Taken Time  BP 132/78 05/19/2018  8:53 AM  Temp    Pulse 72 05/19/2018  8:55 AM  Resp 14 05/19/2018  8:55 AM  SpO2 95 % 05/19/2018  8:55 AM  Vitals shown include unvalidated device data.  Last Pain:  Vitals:   05/19/18 0733  TempSrc: Oral  PainSc: 0-No pain      Patients Stated Pain Goal: 8 (12/81/18 8677)  Complications: No apparent anesthesia complications

## 2018-05-19 NOTE — Anesthesia Preprocedure Evaluation (Signed)
Anesthesia Evaluation  Patient identified by MRN, date of birth, ID band Patient awake    Reviewed: Allergy & Precautions, H&P , NPO status , Patient's Chart, lab work & pertinent test results  Airway Mallampati: II  TM Distance: >3 FB Neck ROM: full    Dental no notable dental hx.    Pulmonary neg pulmonary ROS,    Pulmonary exam normal breath sounds clear to auscultation       Cardiovascular Exercise Tolerance: Good negative cardio ROS   Rhythm:regular Rate:Normal     Neuro/Psych negative neurological ROS  negative psych ROS   GI/Hepatic negative GI ROS, Neg liver ROS,   Endo/Other  negative endocrine ROS  Renal/GU negative Renal ROS  negative genitourinary   Musculoskeletal   Abdominal   Peds  Hematology negative hematology ROS (+)   Anesthesia Other Findings   Reproductive/Obstetrics negative OB ROS                             Anesthesia Physical Anesthesia Plan  ASA: III  Anesthesia Plan: General   Post-op Pain Management:    Induction:   PONV Risk Score and Plan:   Airway Management Planned:   Additional Equipment:   Intra-op Plan:   Post-operative Plan:   Informed Consent: I have reviewed the patients History and Physical, chart, labs and discussed the procedure including the risks, benefits and alternatives for the proposed anesthesia with the patient or authorized representative who has indicated his/her understanding and acceptance.     Dental Advisory Given  Plan Discussed with: CRNA  Anesthesia Plan Comments:         Anesthesia Quick Evaluation

## 2018-05-19 NOTE — Op Note (Signed)
Preoperative diagnosis: 11 mm right posterior wall bladder tumor  Postoperative diagnosis: 15 mm right posterior bladder wall tumor  Principal procedure: TURBT 15 mm right posterior bladder wall tumor, placement of intravesical gemcitabine  Surgeon: Shaneeka Scarboro  Anesthesia: General with LMA  Complications: None  Drains: 56 French Foley, to leg bag  Specimen: Bladder tumor check pathology  Indications: 78 year old male with recurrent urothelial carcinoma the bladder upon recent cystoscopy.  He presents at this time for TURBT as well as placement of intravesical gemcitabine following the procedure.  I discussed the procedure as well as risks and complications with the patient.  These include but not limited to infection, bleeding, need for catheterization, anesthetic complications, bladder perforation.  He understands these and desires to proceed.  Description of procedure: The patient was properly identified in the holding area.  He received preoperative IV antibiotics.  Was taken to the operating room where general anesthetic was administered with the LMA.  He was placed in the dorsolithotomy position.  Genitalia and perineum were prepped and draped.  Proper timeout was performed.  54 French resectoscope sheath passed using the visual obturator.  Urethra was normal, prostate nonobstructive (status post UroLift in the past).  The bladder was inspected circumferentially.  Ureteral orifices were normal in their location and configuration.  Several areas of the urothelium were consistent with prior resection with scarring.  There was low papillary lesion in the right posterior bladder wall superior to the right ureteral orifice.  This was approximately 15 mm in size.  No other lesions were seen within the bladder.  I then placed the cutting loop.  The lesion was resected into the muscular layers.  3-4 fragments were obtained.  These were sent for pathologic inspection.  The base of the bladder tumor  resection site was cauterized.  There was no evidence of perforation.  There was adequate hemostasis.  The scope was removed.  61 French Foley catheter was placed.  The patient was then awakened and taken to the PACU in stable condition.  He tolerated the procedure well.  In the PACU, 2 g of gemcitabine was then placed within the bladder and appropriate diluent and left indwelling for 1 hour and then drained.

## 2018-05-19 NOTE — Anesthesia Postprocedure Evaluation (Signed)
Anesthesia Post Note  Patient: Chartered loss adjuster  Procedure(s) Performed: TRANSURETHRAL RESECTION OF BLADDER TUMOR (TURBT) AND (INTRAVESICAL GEMCITABINE INSTILLED IN BLADDER IN PACU)  (N/A Bladder)  Patient location during evaluation: PACU Anesthesia Type: General Level of consciousness: awake and alert and patient cooperative Pain management: pain level controlled Vital Signs Assessment: post-procedure vital signs reviewed and stable Respiratory status: spontaneous breathing, nonlabored ventilation and respiratory function stable Cardiovascular status: blood pressure returned to baseline Postop Assessment: no apparent nausea or vomiting Anesthetic complications: no     Last Vitals:  Vitals:   05/19/18 0852 05/19/18 0900  BP: 132/78 (!) 131/92  Pulse: (!) 15 71  Resp: 18 17  Temp: 36.9 C   SpO2: 96% 96%    Last Pain:  Vitals:   05/19/18 0913  TempSrc:   PainSc: 0-No pain                 Ethelbert Thain J

## 2018-05-19 NOTE — Discharge Instructions (Signed)
1. You may see some blood in the urine and may have some burning with urination for 48-72 hours. You also may notice that you have to urinate more frequently or urgently after your procedure which is normal.  2. You should call should you develop an inability urinate, fever > 101, persistent nausea and vomiting that prevents you from eating or drinking to stay hydrated.  3. If you have a catheter, you will be taught how to take care of the catheter by the nursing staff prior to discharge from the hospital.  You may periodically feel a strong urge to void with the catheter in place.  This is a bladder spasm and most often can occur when having a bowel movement or moving around. It is typically self-limited and usually will stop after a few minutes.  You may use some Vaseline or Neosporin around the tip of the catheter to reduce friction at the tip of the penis. You may also see some blood in the urine.  A very small amount of blood can make the urine look quite red.  As long as the catheter is draining well, there usually is not a problem.  However, if the catheter is not draining well and is bloody, you should call the office [phone]) to notify us.  It is okay to remove the catheter on Friday morning as instructed by the nurses.    Transurethral Resection of Bladder Tumor, Care After This sheet gives you information about how to care for yourself after your procedure. Your health care provider may also give you more specific instructions. If you have problems or questions, contact your health care provider. What can I expect after the procedure? After the procedure, it is common to have:  A small amount of blood in your urine for up to 2 weeks.  Soreness or mild pain from your catheter. After your catheter is removed, you may have mild soreness, especially when urinating.  Pain in your lower abdomen. Follow these instructions at home: Medicines   Take over-the-counter and prescription  medicines only as told by your health care provider.  If you were prescribed an antibiotic medicine, take it as told by your health care provider. Do not stop taking the antibiotic even if you start to feel better.  Do not drive for 24 hours if you were given a sedative during your procedure.  Ask your health care provider if the medicine prescribed to you: ? Requires you to avoid driving or using heavy machinery. ? Can cause constipation. You may need to take these actions to prevent or treat constipation:  Take over-the-counter or prescription medicines.  Eat foods that are high in fiber, such as beans, whole grains, and fresh fruits and vegetables.  Limit foods that are high in fat and processed sugars, such as fried or sweet foods. Activity  Return to your normal activities as told by your health care provider. Ask your health care provider what activities are safe for you.  Do not lift anything that is heavier than 10 lb (4.5 kg), or the limit that you are told, until your health care provider says that it is safe.  Avoid intense physical activity for as long as told by your health care provider.  Rest as told by your health care provider.  Avoid sitting for a long time without moving. Get up to take short walks every 1-2 hours. This is important to improve blood flow and breathing. Ask for help if you feel weak  or unsteady. General instructions   Do not drink alcohol for as long as told by your health care provider. This is especially important if you are taking prescription pain medicines.  Do not take baths, swim, or use a hot tub until your health care provider approves. Ask your health care provider if you may take showers. You may only be allowed to take sponge baths.  If you have a catheter, follow instructions from your health care provider about caring for your catheter and your drainage bag.  Drink enough fluid to keep your urine pale yellow.  Wear compression  stockings as told by your health care provider. These stockings help to prevent blood clots and reduce swelling in your legs.  Keep all follow-up visits as told by your health care provider. This is important. ? You will need to be followed closely with regular checks of your bladder and urethra (cystoscopies) to make sure that the cancer does not come back. Contact a health care provider if:  You have pain that gets worse or does not improve with medicine.  You have blood in your urine for more than 2 weeks.  You have cloudy or bad-smelling urine.  You become constipated. Signs of constipation may include having: ? Fewer than three bowel movements in a week. ? Difficulty having a bowel movement. ? Stools that are dry, hard, or larger than normal.  You have a fever. Get help right away if:  You have: ? Severe pain. ? Bright red blood in your urine. ? Blood clots in your urine. ? A lot of blood in your urine.  Your catheter has been removed and you are not able to urinate.  You have a catheter in place and the catheter is not draining urine. Summary  After your procedure, it is common to have a small amount of blood in your urine, soreness or mild pain from your catheter, and pain in your lower abdomen.  Take over-the-counter and prescription medicines only as told by your health care provider.  Rest as told by your health care provider. Follow your health care provider's instructions about returning to normal activities. Ask what activities are safe for you.  If you have a catheter, follow instructions from your health care provider about caring for your catheter and your drainage bag.  Get help right away if you cannot urinate, you have severe pain, or you have bright red blood or blood clots in your urine. This information is not intended to replace advice given to you by your health care provider. Make sure you discuss any questions you have with your health care  provider. Document Released: 03/11/2015 Document Revised: 10/28/2017 Document Reviewed: 10/28/2017 Elsevier Interactive Patient Education  2019 [employer] Anesthesia, Adult, Care After This sheet gives you information about how to care for yourself after your procedure. Your health care provider may also give you more specific instructions. If you have problems or questions, contact your health care provider. What can I expect after the procedure? After the procedure, the following side effects are common:  Pain or discomfort at the IV site.  Nausea.  Vomiting.  Sore throat.  Trouble concentrating.  Feeling cold or chills.  Weak or tired.  Sleepiness and fatigue.  Soreness and body aches. These side effects can affect parts of the body that were not involved in surgery. Follow these instructions at home:  For at least 24 hours after the procedure:  Have a responsible  adult stay with you. It is important to have someone help care for you until you are awake and alert.  Rest as needed.  Do not: ? Participate in activities in which you could fall or become injured. ? Drive. ? Use heavy machinery. ? Drink alcohol. ? Take sleeping pills or medicines that cause drowsiness. ? Make important decisions or sign legal documents. ? Take care of children on your own. Eating and drinking  Follow any instructions from your health care provider about eating or drinking restrictions.  When you feel hungry, start by eating small amounts of foods that are soft and easy to digest (bland), such as toast. Gradually return to your regular diet.  Drink enough fluid to keep your urine pale yellow.  If you vomit, rehydrate by drinking water, juice, or clear broth. General instructions  If you have sleep apnea, surgery and certain medicines can increase your risk for breathing problems. Follow instructions from your health care provider about wearing your sleep  device: ? Anytime you are sleeping, including during daytime naps. ? While taking prescription pain medicines, sleeping medicines, or medicines that make you drowsy.  Return to your normal activities as told by your health care provider. Ask your health care provider what activities are safe for you.  Take over-the-counter and prescription medicines only as told by your health care provider.  If you smoke, do not smoke without supervision.  Keep all follow-up visits as told by your health care provider. This is important. Contact a health care provider if:  You have nausea or vomiting that does not get better with medicine.  You cannot eat or drink without vomiting.  You have pain that does not get better with medicine.  You are unable to pass urine.  You develop a skin rash.  You have a fever.  You have redness around your IV site that gets worse. Get help right away if:  You have difficulty breathing.  You have chest pain.  You have blood in your urine or stool, or you vomit blood. Summary  After the procedure, it is common to have a sore throat or nausea. It is also common to feel tired.  Have a responsible adult stay with you for the first 24 hours after general anesthesia. It is important to have someone help care for you until you are awake and alert.  When you feel hungry, start by eating small amounts of foods that are soft and easy to digest (bland), such as toast. Gradually return to your regular diet.  Drink enough fluid to keep your urine pale yellow.  Return to your normal activities as told by your health care provider. Ask your health care provider what activities are safe for you. This information is not intended to replace advice given to you by your health care provider. Make sure you discuss any questions you have with your health care provider. Document Released: 07/06/2000 Document Revised: 11/13/2016 Document Reviewed: 11/13/2016 Elsevier  Interactive Patient Education  2019 [employer].

## 2018-05-20 ENCOUNTER — Encounter (HOSPITAL_COMMUNITY): Payer: Self-pay | Admitting: Urology

## 2018-05-23 DIAGNOSIS — J069 Acute upper respiratory infection, unspecified: Secondary | ICD-10-CM | POA: Diagnosis not present

## 2018-05-23 DIAGNOSIS — M545 Low back pain: Secondary | ICD-10-CM | POA: Diagnosis not present

## 2018-05-23 DIAGNOSIS — Z6827 Body mass index (BMI) 27.0-27.9, adult: Secondary | ICD-10-CM | POA: Diagnosis not present

## 2018-05-23 DIAGNOSIS — Z299 Encounter for prophylactic measures, unspecified: Secondary | ICD-10-CM | POA: Diagnosis not present

## 2018-05-23 DIAGNOSIS — N400 Enlarged prostate without lower urinary tract symptoms: Secondary | ICD-10-CM | POA: Diagnosis not present

## 2018-05-23 DIAGNOSIS — R413 Other amnesia: Secondary | ICD-10-CM | POA: Diagnosis not present

## 2018-06-06 DIAGNOSIS — E7800 Pure hypercholesterolemia, unspecified: Secondary | ICD-10-CM | POA: Diagnosis not present

## 2018-06-06 DIAGNOSIS — M159 Polyosteoarthritis, unspecified: Secondary | ICD-10-CM | POA: Diagnosis not present

## 2018-06-15 DIAGNOSIS — R69 Illness, unspecified: Secondary | ICD-10-CM | POA: Diagnosis not present

## 2018-06-16 DIAGNOSIS — R413 Other amnesia: Secondary | ICD-10-CM | POA: Diagnosis not present

## 2018-06-23 DIAGNOSIS — R413 Other amnesia: Secondary | ICD-10-CM | POA: Diagnosis not present

## 2018-06-23 DIAGNOSIS — Z299 Encounter for prophylactic measures, unspecified: Secondary | ICD-10-CM | POA: Diagnosis not present

## 2018-06-23 DIAGNOSIS — Z6827 Body mass index (BMI) 27.0-27.9, adult: Secondary | ICD-10-CM | POA: Diagnosis not present

## 2018-06-28 DIAGNOSIS — Z6827 Body mass index (BMI) 27.0-27.9, adult: Secondary | ICD-10-CM | POA: Diagnosis not present

## 2018-06-28 DIAGNOSIS — Z79899 Other long term (current) drug therapy: Secondary | ICD-10-CM | POA: Diagnosis not present

## 2018-06-28 DIAGNOSIS — Z1339 Encounter for screening examination for other mental health and behavioral disorders: Secondary | ICD-10-CM | POA: Diagnosis not present

## 2018-06-28 DIAGNOSIS — Z7189 Other specified counseling: Secondary | ICD-10-CM | POA: Diagnosis not present

## 2018-06-28 DIAGNOSIS — Z1211 Encounter for screening for malignant neoplasm of colon: Secondary | ICD-10-CM | POA: Diagnosis not present

## 2018-06-28 DIAGNOSIS — Z1331 Encounter for screening for depression: Secondary | ICD-10-CM | POA: Diagnosis not present

## 2018-06-28 DIAGNOSIS — Z125 Encounter for screening for malignant neoplasm of prostate: Secondary | ICD-10-CM | POA: Diagnosis not present

## 2018-06-28 DIAGNOSIS — Z299 Encounter for prophylactic measures, unspecified: Secondary | ICD-10-CM | POA: Diagnosis not present

## 2018-06-28 DIAGNOSIS — E7800 Pure hypercholesterolemia, unspecified: Secondary | ICD-10-CM | POA: Diagnosis not present

## 2018-06-28 DIAGNOSIS — R5383 Other fatigue: Secondary | ICD-10-CM | POA: Diagnosis not present

## 2018-06-28 DIAGNOSIS — Z0000 Encounter for general adult medical examination without abnormal findings: Secondary | ICD-10-CM | POA: Diagnosis not present

## 2018-07-04 DIAGNOSIS — E7800 Pure hypercholesterolemia, unspecified: Secondary | ICD-10-CM | POA: Diagnosis not present

## 2018-07-04 DIAGNOSIS — M159 Polyosteoarthritis, unspecified: Secondary | ICD-10-CM | POA: Diagnosis not present

## 2018-08-01 DIAGNOSIS — M159 Polyosteoarthritis, unspecified: Secondary | ICD-10-CM | POA: Diagnosis not present

## 2018-08-01 DIAGNOSIS — E7800 Pure hypercholesterolemia, unspecified: Secondary | ICD-10-CM | POA: Diagnosis not present

## 2018-08-23 ENCOUNTER — Ambulatory Visit (INDEPENDENT_AMBULATORY_CARE_PROVIDER_SITE_OTHER): Payer: Medicare HMO | Admitting: Urology

## 2018-08-23 DIAGNOSIS — N401 Enlarged prostate with lower urinary tract symptoms: Secondary | ICD-10-CM | POA: Diagnosis not present

## 2018-08-23 DIAGNOSIS — C678 Malignant neoplasm of overlapping sites of bladder: Secondary | ICD-10-CM

## 2018-08-23 DIAGNOSIS — R351 Nocturia: Secondary | ICD-10-CM | POA: Diagnosis not present

## 2018-09-02 DIAGNOSIS — M159 Polyosteoarthritis, unspecified: Secondary | ICD-10-CM | POA: Diagnosis not present

## 2018-09-02 DIAGNOSIS — E7800 Pure hypercholesterolemia, unspecified: Secondary | ICD-10-CM | POA: Diagnosis not present

## 2018-09-06 ENCOUNTER — Other Ambulatory Visit: Payer: Self-pay | Admitting: Dermatology

## 2018-09-06 DIAGNOSIS — D229 Melanocytic nevi, unspecified: Secondary | ICD-10-CM | POA: Diagnosis not present

## 2018-09-06 DIAGNOSIS — L720 Epidermal cyst: Secondary | ICD-10-CM | POA: Diagnosis not present

## 2018-09-06 DIAGNOSIS — D0462 Carcinoma in situ of skin of left upper limb, including shoulder: Secondary | ICD-10-CM | POA: Diagnosis not present

## 2018-09-06 DIAGNOSIS — L821 Other seborrheic keratosis: Secondary | ICD-10-CM | POA: Diagnosis not present

## 2018-10-03 DIAGNOSIS — Z0389 Encounter for observation for other suspected diseases and conditions ruled out: Secondary | ICD-10-CM | POA: Diagnosis not present

## 2018-10-03 DIAGNOSIS — I6529 Occlusion and stenosis of unspecified carotid artery: Secondary | ICD-10-CM | POA: Diagnosis not present

## 2018-10-03 DIAGNOSIS — M159 Polyosteoarthritis, unspecified: Secondary | ICD-10-CM | POA: Diagnosis not present

## 2018-10-03 DIAGNOSIS — E7800 Pure hypercholesterolemia, unspecified: Secondary | ICD-10-CM | POA: Diagnosis not present

## 2018-10-31 DIAGNOSIS — E7800 Pure hypercholesterolemia, unspecified: Secondary | ICD-10-CM | POA: Diagnosis not present

## 2018-10-31 DIAGNOSIS — M159 Polyosteoarthritis, unspecified: Secondary | ICD-10-CM | POA: Diagnosis not present

## 2018-12-06 DIAGNOSIS — M159 Polyosteoarthritis, unspecified: Secondary | ICD-10-CM | POA: Diagnosis not present

## 2018-12-06 DIAGNOSIS — E7800 Pure hypercholesterolemia, unspecified: Secondary | ICD-10-CM | POA: Diagnosis not present

## 2018-12-20 DIAGNOSIS — R69 Illness, unspecified: Secondary | ICD-10-CM | POA: Diagnosis not present

## 2018-12-27 ENCOUNTER — Ambulatory Visit (INDEPENDENT_AMBULATORY_CARE_PROVIDER_SITE_OTHER): Payer: Medicare HMO | Admitting: Urology

## 2018-12-27 DIAGNOSIS — C678 Malignant neoplasm of overlapping sites of bladder: Secondary | ICD-10-CM

## 2018-12-27 DIAGNOSIS — N401 Enlarged prostate with lower urinary tract symptoms: Secondary | ICD-10-CM | POA: Diagnosis not present

## 2018-12-27 DIAGNOSIS — R351 Nocturia: Secondary | ICD-10-CM

## 2019-01-04 DIAGNOSIS — R69 Illness, unspecified: Secondary | ICD-10-CM | POA: Diagnosis not present

## 2019-01-17 DIAGNOSIS — M1711 Unilateral primary osteoarthritis, right knee: Secondary | ICD-10-CM | POA: Diagnosis not present

## 2019-01-18 DIAGNOSIS — M159 Polyosteoarthritis, unspecified: Secondary | ICD-10-CM | POA: Diagnosis not present

## 2019-01-18 DIAGNOSIS — E7800 Pure hypercholesterolemia, unspecified: Secondary | ICD-10-CM | POA: Diagnosis not present

## 2019-01-26 ENCOUNTER — Other Ambulatory Visit: Payer: Self-pay | Admitting: Dermatology

## 2019-01-26 DIAGNOSIS — L720 Epidermal cyst: Secondary | ICD-10-CM | POA: Diagnosis not present

## 2019-02-14 DIAGNOSIS — E7800 Pure hypercholesterolemia, unspecified: Secondary | ICD-10-CM | POA: Diagnosis not present

## 2019-02-14 DIAGNOSIS — M159 Polyosteoarthritis, unspecified: Secondary | ICD-10-CM | POA: Diagnosis not present

## 2019-03-14 ENCOUNTER — Other Ambulatory Visit: Payer: Self-pay

## 2019-03-14 DIAGNOSIS — R69 Illness, unspecified: Secondary | ICD-10-CM | POA: Diagnosis not present

## 2019-03-14 DIAGNOSIS — Z20822 Contact with and (suspected) exposure to covid-19: Secondary | ICD-10-CM

## 2019-03-16 LAB — NOVEL CORONAVIRUS, NAA: SARS-CoV-2, NAA: NOT DETECTED

## 2019-03-17 ENCOUNTER — Telehealth: Payer: Self-pay | Admitting: General Practice

## 2019-03-17 NOTE — Telephone Encounter (Signed)
Negative COVID results given. Patient results "NOT Detected." Caller expressed understanding. ° °

## 2019-04-06 DIAGNOSIS — E7800 Pure hypercholesterolemia, unspecified: Secondary | ICD-10-CM | POA: Diagnosis not present

## 2019-04-06 DIAGNOSIS — M159 Polyosteoarthritis, unspecified: Secondary | ICD-10-CM | POA: Diagnosis not present

## 2019-05-05 DIAGNOSIS — M159 Polyosteoarthritis, unspecified: Secondary | ICD-10-CM | POA: Diagnosis not present

## 2019-05-05 DIAGNOSIS — E7800 Pure hypercholesterolemia, unspecified: Secondary | ICD-10-CM | POA: Diagnosis not present

## 2019-06-02 DIAGNOSIS — E7800 Pure hypercholesterolemia, unspecified: Secondary | ICD-10-CM | POA: Diagnosis not present

## 2019-06-02 DIAGNOSIS — M159 Polyosteoarthritis, unspecified: Secondary | ICD-10-CM | POA: Diagnosis not present

## 2019-07-03 DIAGNOSIS — Z1211 Encounter for screening for malignant neoplasm of colon: Secondary | ICD-10-CM | POA: Diagnosis not present

## 2019-07-03 DIAGNOSIS — Z0000 Encounter for general adult medical examination without abnormal findings: Secondary | ICD-10-CM | POA: Diagnosis not present

## 2019-07-03 DIAGNOSIS — E0590 Thyrotoxicosis, unspecified without thyrotoxic crisis or storm: Secondary | ICD-10-CM | POA: Diagnosis not present

## 2019-07-03 DIAGNOSIS — Z1331 Encounter for screening for depression: Secondary | ICD-10-CM | POA: Diagnosis not present

## 2019-07-03 DIAGNOSIS — Z1339 Encounter for screening examination for other mental health and behavioral disorders: Secondary | ICD-10-CM | POA: Diagnosis not present

## 2019-07-03 DIAGNOSIS — Z79899 Other long term (current) drug therapy: Secondary | ICD-10-CM | POA: Diagnosis not present

## 2019-07-03 DIAGNOSIS — E7800 Pure hypercholesterolemia, unspecified: Secondary | ICD-10-CM | POA: Diagnosis not present

## 2019-07-03 DIAGNOSIS — Z7189 Other specified counseling: Secondary | ICD-10-CM | POA: Diagnosis not present

## 2019-07-03 DIAGNOSIS — G4700 Insomnia, unspecified: Secondary | ICD-10-CM | POA: Diagnosis not present

## 2019-07-03 DIAGNOSIS — Z299 Encounter for prophylactic measures, unspecified: Secondary | ICD-10-CM | POA: Diagnosis not present

## 2019-07-03 DIAGNOSIS — Z6827 Body mass index (BMI) 27.0-27.9, adult: Secondary | ICD-10-CM | POA: Diagnosis not present

## 2019-07-03 DIAGNOSIS — Z125 Encounter for screening for malignant neoplasm of prostate: Secondary | ICD-10-CM | POA: Diagnosis not present

## 2019-08-08 DIAGNOSIS — M25531 Pain in right wrist: Secondary | ICD-10-CM | POA: Diagnosis not present

## 2019-08-10 DIAGNOSIS — M159 Polyosteoarthritis, unspecified: Secondary | ICD-10-CM | POA: Diagnosis not present

## 2019-08-10 DIAGNOSIS — E7800 Pure hypercholesterolemia, unspecified: Secondary | ICD-10-CM | POA: Diagnosis not present

## 2019-08-16 DIAGNOSIS — Z299 Encounter for prophylactic measures, unspecified: Secondary | ICD-10-CM | POA: Diagnosis not present

## 2019-08-16 DIAGNOSIS — H6121 Impacted cerumen, right ear: Secondary | ICD-10-CM | POA: Diagnosis not present

## 2019-08-24 DIAGNOSIS — R69 Illness, unspecified: Secondary | ICD-10-CM | POA: Diagnosis not present

## 2019-09-19 DIAGNOSIS — M1711 Unilateral primary osteoarthritis, right knee: Secondary | ICD-10-CM | POA: Diagnosis not present

## 2019-10-06 DIAGNOSIS — R1031 Right lower quadrant pain: Secondary | ICD-10-CM | POA: Diagnosis not present

## 2019-10-06 DIAGNOSIS — K5730 Diverticulosis of large intestine without perforation or abscess without bleeding: Secondary | ICD-10-CM | POA: Diagnosis not present

## 2019-10-06 DIAGNOSIS — I700 Atherosclerosis of aorta: Secondary | ICD-10-CM | POA: Diagnosis not present

## 2019-10-06 DIAGNOSIS — N132 Hydronephrosis with renal and ureteral calculous obstruction: Secondary | ICD-10-CM | POA: Diagnosis not present

## 2019-10-06 DIAGNOSIS — K5792 Diverticulitis of intestine, part unspecified, without perforation or abscess without bleeding: Secondary | ICD-10-CM | POA: Diagnosis not present

## 2019-10-06 DIAGNOSIS — K4090 Unilateral inguinal hernia, without obstruction or gangrene, not specified as recurrent: Secondary | ICD-10-CM | POA: Diagnosis not present

## 2019-10-06 DIAGNOSIS — M4319 Spondylolisthesis, multiple sites in spine: Secondary | ICD-10-CM | POA: Diagnosis not present

## 2019-10-06 DIAGNOSIS — R7989 Other specified abnormal findings of blood chemistry: Secondary | ICD-10-CM | POA: Diagnosis not present

## 2019-10-06 DIAGNOSIS — K8020 Calculus of gallbladder without cholecystitis without obstruction: Secondary | ICD-10-CM | POA: Diagnosis not present

## 2019-10-06 DIAGNOSIS — R110 Nausea: Secondary | ICD-10-CM | POA: Diagnosis not present

## 2019-10-11 DIAGNOSIS — E7800 Pure hypercholesterolemia, unspecified: Secondary | ICD-10-CM | POA: Diagnosis not present

## 2019-10-11 DIAGNOSIS — M159 Polyosteoarthritis, unspecified: Secondary | ICD-10-CM | POA: Diagnosis not present

## 2019-10-17 DIAGNOSIS — M19031 Primary osteoarthritis, right wrist: Secondary | ICD-10-CM | POA: Diagnosis not present

## 2019-10-18 DIAGNOSIS — R69 Illness, unspecified: Secondary | ICD-10-CM | POA: Diagnosis not present

## 2019-11-01 DIAGNOSIS — E7800 Pure hypercholesterolemia, unspecified: Secondary | ICD-10-CM | POA: Diagnosis not present

## 2019-11-01 DIAGNOSIS — E0590 Thyrotoxicosis, unspecified without thyrotoxic crisis or storm: Secondary | ICD-10-CM | POA: Diagnosis not present

## 2019-11-01 DIAGNOSIS — R0683 Snoring: Secondary | ICD-10-CM | POA: Diagnosis not present

## 2019-11-01 DIAGNOSIS — I779 Disorder of arteries and arterioles, unspecified: Secondary | ICD-10-CM | POA: Diagnosis not present

## 2019-11-01 DIAGNOSIS — C679 Malignant neoplasm of bladder, unspecified: Secondary | ICD-10-CM | POA: Diagnosis not present

## 2019-11-10 DIAGNOSIS — M159 Polyosteoarthritis, unspecified: Secondary | ICD-10-CM | POA: Diagnosis not present

## 2019-11-10 DIAGNOSIS — E7800 Pure hypercholesterolemia, unspecified: Secondary | ICD-10-CM | POA: Diagnosis not present

## 2019-12-05 DIAGNOSIS — M159 Polyosteoarthritis, unspecified: Secondary | ICD-10-CM | POA: Diagnosis not present

## 2019-12-05 DIAGNOSIS — E7800 Pure hypercholesterolemia, unspecified: Secondary | ICD-10-CM | POA: Diagnosis not present

## 2020-01-02 DIAGNOSIS — R69 Illness, unspecified: Secondary | ICD-10-CM | POA: Diagnosis not present

## 2020-01-11 DIAGNOSIS — E7800 Pure hypercholesterolemia, unspecified: Secondary | ICD-10-CM | POA: Diagnosis not present

## 2020-01-11 DIAGNOSIS — M159 Polyosteoarthritis, unspecified: Secondary | ICD-10-CM | POA: Diagnosis not present

## 2020-02-06 ENCOUNTER — Encounter: Payer: Self-pay | Admitting: Urology

## 2020-02-06 ENCOUNTER — Other Ambulatory Visit: Payer: Self-pay

## 2020-02-06 ENCOUNTER — Ambulatory Visit (INDEPENDENT_AMBULATORY_CARE_PROVIDER_SITE_OTHER): Payer: Medicare HMO | Admitting: Urology

## 2020-02-06 VITALS — BP 150/65 | HR 77 | Temp 97.9°F

## 2020-02-06 DIAGNOSIS — N401 Enlarged prostate with lower urinary tract symptoms: Secondary | ICD-10-CM | POA: Diagnosis not present

## 2020-02-06 DIAGNOSIS — N138 Other obstructive and reflux uropathy: Secondary | ICD-10-CM

## 2020-02-06 DIAGNOSIS — R351 Nocturia: Secondary | ICD-10-CM

## 2020-02-06 DIAGNOSIS — N210 Calculus in bladder: Secondary | ICD-10-CM

## 2020-02-06 DIAGNOSIS — C678 Malignant neoplasm of overlapping sites of bladder: Secondary | ICD-10-CM | POA: Diagnosis not present

## 2020-02-06 LAB — MICROSCOPIC EXAMINATION
Bacteria, UA: NONE SEEN
Epithelial Cells (non renal): NONE SEEN /hpf (ref 0–10)
Renal Epithel, UA: NONE SEEN /hpf
WBC, UA: NONE SEEN /hpf (ref 0–5)

## 2020-02-06 LAB — URINALYSIS, ROUTINE W REFLEX MICROSCOPIC
Bilirubin, UA: NEGATIVE
Glucose, UA: NEGATIVE
Leukocytes,UA: NEGATIVE
Nitrite, UA: NEGATIVE
Protein,UA: NEGATIVE
Specific Gravity, UA: >1.03 — ABNORMAL HIGH (ref 1.005–1.030)
Urobilinogen, Ur: 0.2 mg/dL (ref 0.2–1.0)
pH, UA: 5.5 (ref 5.0–7.5)

## 2020-02-06 MED ORDER — CIPROFLOXACIN HCL 500 MG PO TABS: 500.0000 mg | ORAL_TABLET | Freq: Once | ORAL | Status: DC

## 2020-02-06 NOTE — Progress Notes (Signed)
History of Present Illness: This man is here today for follow-up of bladder cancer.   4.24.2017: TURBT of a 2 cm low-grade nonmuscle invasive bladder tumor by Dr. Exie Parody.  Repeat resection for recurrence in August 2017.  Mitomycin was placed after each procedure.  2.6.2020: Repeat TURBT/gemcitabine for 15 mm right posterior bladder wall lesion, low-grade nonmuscle invasive on pathology review.  5.19.2019--he underwent UroLift for BPH symptoms.  6 implants were placed.  9.15.2020: Cystoscopy revealed no evident recurrence  10.26.2021: He denies recent hematuria.  Overall, lower urinary tract symptoms tolerable.    Past Medical History:  Diagnosis Date  . Hyperlipidemia     Past Surgical History:  Procedure Laterality Date  . epidural     back pain  . MINOR HEMORRHOIDECTOMY    . SHOULDER SURGERY    . TRANSURETHRAL RESECTION OF BLADDER TUMOR  2014  . TRANSURETHRAL RESECTION OF BLADDER TUMOR N/A 05/19/2018   Procedure: TRANSURETHRAL RESECTION OF BLADDER TUMOR (TURBT) AND (INTRAVESICAL GEMCITABINE INSTILLED IN BLADDER IN PACU) ;  Surgeon: Franchot Gallo, MD;  Location: [facility];  Service: Urology;  Laterality: N/A;  30 MINS    Home Medications:  Allergies as of 02/06/2020   No Known Allergies     Medication List       Accurate as of February 06, 2020 12:46 PM. If you have any questions, ask your nurse or doctor.        aspirin EC 81 MG tablet Take 81 mg by mouth daily.   atorvastatin 20 MG tablet Commonly known as: LIPITOR Take 20 mg by mouth daily.   loratadine 10 MG tablet Commonly known as: CLARITIN Take 10 mg by mouth daily.       Allergies: No Known Allergies  No family history on file.  Social History:  reports that he has never smoked. He has never used smokeless tobacco. He reports that he does not drink alcohol and does not use drugs.  ROS: A complete review of systems was performed.  All systems are negative except for pertinent findings as  noted.  Physical Exam:  Vital signs in last 24 hours: There were no vitals taken for this visit. Constitutional:  Alert and oriented, No acute distress Cardiovascular: Regular rate  Respiratory: Normal respiratory effort Genitourinary: Normal male phallus, testes are descended bilaterally and non-tender and without masses, scrotum is normal in appearance without lesions or masses, perineum is normal on inspection.  Uncircumcised. Lymphatic: No lymphadenopathy Neurologic: Grossly intact, no focal deficits Psychiatric: Normal mood and affect  Cystoscopy Procedure Note:  Indication: Followup of bladder cancer  After informed consent and discussion of the procedure and its risks, Eshaan Hottenstein was positioned and prepped in the standard fashion.  Cystoscopy was performed with a flexible cystoscope.   Findings: Urethra: No lesions/stricture Prostate: Minimally obstructing Bladder neck: No abnormalities Ureteral orifices: Normal bilaterally Bladder: No urothelial abnormalities.  Small bladder calculus, possibly on an implant, I think at the left bladder neck.  Perhaps 3 mm in size.  The patient tolerated the procedure well.  I have reviewed prior pt notes  I have reviewed notes from referring/previous physicians  I have reviewed urinalysis results  I have reviewed prior pathology results   Impression/Assessment:  Urothelial carcinoma the bladder, low-grade, nonmuscle invasive with history of recurrences  BPH, status post UroLift procedure about 2 years ago, tolerable symptoms  Small bladder calculus, asymptomatic  Plan:  I will see him back in a year for next cystoscopy.  I do not think  we need to do anything about the small bladder neck calculus

## 2020-02-06 NOTE — Progress Notes (Signed)
Urological Symptom Review  Patient is experiencing the following symptoms: Get up at night to urinate Stream starts and stops   Review of Systems  Gastrointestinal (upper)  : Negative for upper GI symptoms  Gastrointestinal (lower) : Negative for lower GI symptoms  Constitutional : Negative for symptoms  Skin: Negative for skin symptoms  Eyes: Negative for eye symptoms  Ear/Nose/Throat : Negative for Ear/Nose/Throat symptoms  Hematologic/Lymphatic: Negative for Hematologic/Lymphatic symptoms  Cardiovascular : Negative for cardiovascular symptoms  Respiratory : Negative for respiratory symptoms  Endocrine: Negative for endocrine symptoms  Musculoskeletal: Negative for musculoskeletal symptoms  Neurological: Negative for neurological symptoms  Psychologic: Negative for psychiatric symptoms

## 2020-02-09 DIAGNOSIS — E7800 Pure hypercholesterolemia, unspecified: Secondary | ICD-10-CM | POA: Diagnosis not present

## 2020-02-09 DIAGNOSIS — M159 Polyosteoarthritis, unspecified: Secondary | ICD-10-CM | POA: Diagnosis not present

## 2020-02-13 DIAGNOSIS — S63501D Unspecified sprain of right wrist, subsequent encounter: Secondary | ICD-10-CM | POA: Diagnosis not present

## 2020-02-13 DIAGNOSIS — G5631 Lesion of radial nerve, right upper limb: Secondary | ICD-10-CM | POA: Diagnosis not present

## 2020-02-20 DIAGNOSIS — M25532 Pain in left wrist: Secondary | ICD-10-CM | POA: Diagnosis not present

## 2020-02-20 DIAGNOSIS — R531 Weakness: Secondary | ICD-10-CM | POA: Diagnosis not present

## 2020-02-20 DIAGNOSIS — M79642 Pain in left hand: Secondary | ICD-10-CM | POA: Diagnosis not present

## 2020-02-20 DIAGNOSIS — M25521 Pain in right elbow: Secondary | ICD-10-CM | POA: Diagnosis not present

## 2020-02-20 DIAGNOSIS — M79641 Pain in right hand: Secondary | ICD-10-CM | POA: Diagnosis not present

## 2020-02-20 DIAGNOSIS — M25531 Pain in right wrist: Secondary | ICD-10-CM | POA: Diagnosis not present

## 2020-02-29 DIAGNOSIS — R69 Illness, unspecified: Secondary | ICD-10-CM | POA: Diagnosis not present

## 2020-03-05 DIAGNOSIS — M25531 Pain in right wrist: Secondary | ICD-10-CM | POA: Diagnosis not present

## 2020-03-05 DIAGNOSIS — M79642 Pain in left hand: Secondary | ICD-10-CM | POA: Diagnosis not present

## 2020-03-05 DIAGNOSIS — M79641 Pain in right hand: Secondary | ICD-10-CM | POA: Diagnosis not present

## 2020-03-05 DIAGNOSIS — M7701 Medial epicondylitis, right elbow: Secondary | ICD-10-CM | POA: Diagnosis not present

## 2020-03-12 DIAGNOSIS — E7800 Pure hypercholesterolemia, unspecified: Secondary | ICD-10-CM | POA: Diagnosis not present

## 2020-03-12 DIAGNOSIS — M159 Polyosteoarthritis, unspecified: Secondary | ICD-10-CM | POA: Diagnosis not present

## 2020-03-14 DIAGNOSIS — Z299 Encounter for prophylactic measures, unspecified: Secondary | ICD-10-CM | POA: Diagnosis not present

## 2020-03-14 DIAGNOSIS — R059 Cough, unspecified: Secondary | ICD-10-CM | POA: Diagnosis not present

## 2020-03-14 DIAGNOSIS — J069 Acute upper respiratory infection, unspecified: Secondary | ICD-10-CM | POA: Diagnosis not present

## 2020-03-19 DIAGNOSIS — Z299 Encounter for prophylactic measures, unspecified: Secondary | ICD-10-CM | POA: Diagnosis not present

## 2020-03-19 DIAGNOSIS — G8929 Other chronic pain: Secondary | ICD-10-CM | POA: Diagnosis not present

## 2020-03-19 DIAGNOSIS — G4700 Insomnia, unspecified: Secondary | ICD-10-CM | POA: Diagnosis not present

## 2020-03-19 DIAGNOSIS — M545 Low back pain, unspecified: Secondary | ICD-10-CM | POA: Diagnosis not present

## 2020-03-19 DIAGNOSIS — E0590 Thyrotoxicosis, unspecified without thyrotoxic crisis or storm: Secondary | ICD-10-CM | POA: Diagnosis not present

## 2020-04-04 DIAGNOSIS — Z299 Encounter for prophylactic measures, unspecified: Secondary | ICD-10-CM | POA: Diagnosis not present

## 2020-04-04 DIAGNOSIS — I779 Disorder of arteries and arterioles, unspecified: Secondary | ICD-10-CM | POA: Diagnosis not present

## 2020-04-04 DIAGNOSIS — C679 Malignant neoplasm of bladder, unspecified: Secondary | ICD-10-CM | POA: Diagnosis not present

## 2020-04-04 DIAGNOSIS — N1831 Chronic kidney disease, stage 3a: Secondary | ICD-10-CM | POA: Diagnosis not present

## 2020-04-11 DIAGNOSIS — E7800 Pure hypercholesterolemia, unspecified: Secondary | ICD-10-CM | POA: Diagnosis not present

## 2020-04-11 DIAGNOSIS — M159 Polyosteoarthritis, unspecified: Secondary | ICD-10-CM | POA: Diagnosis not present

## 2020-04-17 DIAGNOSIS — E0590 Thyrotoxicosis, unspecified without thyrotoxic crisis or storm: Secondary | ICD-10-CM | POA: Diagnosis not present

## 2020-04-17 DIAGNOSIS — M545 Low back pain, unspecified: Secondary | ICD-10-CM | POA: Diagnosis not present

## 2020-04-17 DIAGNOSIS — Z299 Encounter for prophylactic measures, unspecified: Secondary | ICD-10-CM | POA: Diagnosis not present

## 2020-04-17 DIAGNOSIS — Z87891 Personal history of nicotine dependence: Secondary | ICD-10-CM | POA: Diagnosis not present

## 2020-04-17 DIAGNOSIS — G8929 Other chronic pain: Secondary | ICD-10-CM | POA: Diagnosis not present

## 2020-04-17 DIAGNOSIS — Z6827 Body mass index (BMI) 27.0-27.9, adult: Secondary | ICD-10-CM | POA: Diagnosis not present

## 2020-04-17 DIAGNOSIS — C679 Malignant neoplasm of bladder, unspecified: Secondary | ICD-10-CM | POA: Diagnosis not present

## 2020-05-01 DIAGNOSIS — M545 Low back pain, unspecified: Secondary | ICD-10-CM | POA: Diagnosis not present

## 2020-05-01 DIAGNOSIS — X58XXXA Exposure to other specified factors, initial encounter: Secondary | ICD-10-CM | POA: Diagnosis not present

## 2020-05-01 DIAGNOSIS — S32049A Unspecified fracture of fourth lumbar vertebra, initial encounter for closed fracture: Secondary | ICD-10-CM | POA: Diagnosis not present

## 2020-05-02 DIAGNOSIS — S32040A Wedge compression fracture of fourth lumbar vertebra, initial encounter for closed fracture: Secondary | ICD-10-CM | POA: Diagnosis not present

## 2020-05-02 DIAGNOSIS — Z299 Encounter for prophylactic measures, unspecified: Secondary | ICD-10-CM | POA: Diagnosis not present

## 2020-05-02 DIAGNOSIS — I779 Disorder of arteries and arterioles, unspecified: Secondary | ICD-10-CM | POA: Diagnosis not present

## 2020-05-02 DIAGNOSIS — C679 Malignant neoplasm of bladder, unspecified: Secondary | ICD-10-CM | POA: Diagnosis not present

## 2020-06-11 ENCOUNTER — Other Ambulatory Visit: Payer: Self-pay | Admitting: Internal Medicine

## 2020-06-14 ENCOUNTER — Other Ambulatory Visit: Payer: Self-pay | Admitting: Internal Medicine

## 2020-06-14 ENCOUNTER — Other Ambulatory Visit (HOSPITAL_COMMUNITY): Payer: Self-pay | Admitting: Interventional Radiology

## 2020-06-14 DIAGNOSIS — S32040A Wedge compression fracture of fourth lumbar vertebra, initial encounter for closed fracture: Secondary | ICD-10-CM

## 2020-06-17 ENCOUNTER — Other Ambulatory Visit: Payer: Self-pay | Admitting: Radiology

## 2020-06-18 ENCOUNTER — Other Ambulatory Visit: Payer: Self-pay

## 2020-06-18 ENCOUNTER — Other Ambulatory Visit (HOSPITAL_COMMUNITY): Payer: Self-pay | Admitting: Interventional Radiology

## 2020-06-18 ENCOUNTER — Encounter (HOSPITAL_COMMUNITY): Payer: Self-pay

## 2020-06-18 ENCOUNTER — Ambulatory Visit (HOSPITAL_COMMUNITY)
Admission: RE | Admit: 2020-06-18 | Discharge: 2020-06-18 | Disposition: A | Discharge status: Home / Self Care | Payer: Medicare HMO | Source: Ambulatory Visit | Attending: Interventional Radiology | Admitting: Interventional Radiology

## 2020-06-18 DIAGNOSIS — S32040K Wedge compression fracture of fourth lumbar vertebra, subsequent encounter for fracture with nonunion: Secondary | ICD-10-CM | POA: Diagnosis not present

## 2020-06-18 DIAGNOSIS — X58XXXA Exposure to other specified factors, initial encounter: Secondary | ICD-10-CM | POA: Diagnosis not present

## 2020-06-18 DIAGNOSIS — M4856XA Collapsed vertebra, not elsewhere classified, lumbar region, initial encounter for fracture: Secondary | ICD-10-CM | POA: Diagnosis not present

## 2020-06-18 DIAGNOSIS — S32040A Wedge compression fracture of fourth lumbar vertebra, initial encounter for closed fracture: Secondary | ICD-10-CM | POA: Diagnosis not present

## 2020-06-18 DIAGNOSIS — Z7982 Long term (current) use of aspirin: Secondary | ICD-10-CM | POA: Insufficient documentation

## 2020-06-18 DIAGNOSIS — Z79899 Other long term (current) drug therapy: Secondary | ICD-10-CM | POA: Insufficient documentation

## 2020-06-18 DIAGNOSIS — S32049A Unspecified fracture of fourth lumbar vertebra, initial encounter for closed fracture: Secondary | ICD-10-CM | POA: Diagnosis not present

## 2020-06-18 HISTORY — PX: IR KYPHO LUMBAR INC FX REDUCE BONE BX UNI/BIL CANNULATION INC/IMAGING: IMG5519

## 2020-06-18 HISTORY — PX: IR CT SPINE LTD: IMG2387

## 2020-06-18 LAB — CBC
HCT: 39.7 % (ref 39.0–52.0)
Hemoglobin: 13.8 g/dL (ref 13.0–17.0)
MCH: 32.4 pg (ref 26.0–34.0)
MCHC: 34.8 g/dL (ref 30.0–36.0)
MCV: 93.2 fL (ref 80.0–100.0)
Platelets: 231 10*3/uL (ref 150–400)
RBC: 4.26 MIL/uL (ref 4.22–5.81)
RDW: 12.4 % (ref 11.5–15.5)
WBC: 6.3 10*3/uL (ref 4.0–10.5)
nRBC: 0 % (ref 0.0–0.2)

## 2020-06-18 LAB — BASIC METABOLIC PANEL
Anion gap: 9 (ref 5–15)
BUN: 13 mg/dL (ref 8–23)
CO2: 21 mmol/L — ABNORMAL LOW (ref 22–32)
Calcium: 9 mg/dL (ref 8.9–10.3)
Chloride: 103 mmol/L (ref 98–111)
Creatinine, Ser: 1.36 mg/dL — ABNORMAL HIGH (ref 0.61–1.24)
GFR, Estimated: 54 mL/min — ABNORMAL LOW (ref >60)
Glucose, Bld: 98 mg/dL (ref 70–99)
Potassium: 4.3 mmol/L (ref 3.5–5.1)
Sodium: 133 mmol/L — ABNORMAL LOW (ref 135–145)

## 2020-06-18 LAB — PROTIME-INR
INR: 1 (ref 0.8–1.2)
Prothrombin Time: 13 seconds (ref 11.4–15.2)

## 2020-06-18 MED ORDER — IOHEXOL 300 MG/ML  SOLN: 50.0000 mL | Freq: Once | INTRAMUSCULAR | Status: DC | PRN

## 2020-06-18 MED ORDER — MIDAZOLAM HCL 2 MG/2ML IJ SOLN
INTRAMUSCULAR | Status: AC | PRN
Start: 2020-06-18 — End: 2020-06-18
  Administered 2020-06-18: 1 mg via INTRAVENOUS
  Administered 2020-06-18: 0.5 mg via INTRAVENOUS

## 2020-06-18 MED ORDER — FENTANYL CITRATE (PF) 100 MCG/2ML IJ SOLN
INTRAMUSCULAR | Status: AC
  Filled 2020-06-18: qty 2

## 2020-06-18 MED ORDER — SODIUM CHLORIDE 0.9 % IV SOLN: INTRAVENOUS | Status: DC

## 2020-06-18 MED ORDER — BUPIVACAINE HCL (PF) 0.5 % IJ SOLN
INTRAMUSCULAR | Status: AC | PRN
  Administered 2020-06-18: 20 mL

## 2020-06-18 MED ORDER — SODIUM CHLORIDE 0.9 % IV SOLN: INTRAVENOUS | Status: AC

## 2020-06-18 MED ORDER — MIDAZOLAM HCL 2 MG/2ML IJ SOLN
INTRAMUSCULAR | Status: AC
  Filled 2020-06-18: qty 2

## 2020-06-18 MED ORDER — BUPIVACAINE HCL (PF) 0.5 % IJ SOLN
INTRAMUSCULAR | Status: AC
  Filled 2020-06-18: qty 30

## 2020-06-18 MED ORDER — FENTANYL CITRATE (PF) 100 MCG/2ML IJ SOLN
INTRAMUSCULAR | Status: AC | PRN
  Administered 2020-06-18: 12.5 ug via INTRAVENOUS
  Administered 2020-06-18: 25 ug via INTRAVENOUS

## 2020-06-18 MED ORDER — TOBRAMYCIN SULFATE 1.2 G IJ SOLR
INTRAMUSCULAR | Status: AC
  Filled 2020-06-18: qty 1.2

## 2020-06-18 MED ORDER — CEFAZOLIN SODIUM-DEXTROSE 2-4 GM/100ML-% IV SOLN: 2.0000 g | INTRAVENOUS | Status: AC

## 2020-06-18 MED ORDER — CEFAZOLIN SODIUM-DEXTROSE 2-4 GM/100ML-% IV SOLN
INTRAVENOUS | Status: AC
  Administered 2020-06-18: 2 g via INTRAVENOUS
  Filled 2020-06-18: qty 100

## 2020-06-18 NOTE — Discharge Instructions (Signed)
KYPHOPLASTY/VERTEBROPLASTY DISCHARGE INSTRUCTIONS  Medications: (check all that apply)     Resume all home medications as before procedure.                  Continue your pain medications as prescribed as needed.  Over the next 3-5 days, decrease your pain medication as tolerated.  Over the counter medications (i.e. Tylenol, ibuprofen, and aleve) may be substituted once severe/moderate pain symptoms have subsided.   Wound Care: - Bandages may be removed the day following your procedure.  You may get your incision wet once bandages are removed.  Bandaids may be used to cover the incisions until scab formation.  Topical ointments are optional.  - If you develop a fever greater than 101 degrees, have increased skin redness at the incision sites or pus-like oozing from incisions occurring within 1 week of the procedure, contact radiology at [phone] or [phone].  - Ice pack to back for 15-20 minutes 2-3 time per day for first 2-3 days post procedure.  The ice will expedite muscle healing and help with the pain from the incisions.   Activity: - Bedrest today with limited activity for 24 hours post procedure.  - No driving for 48 hours.  - Increase your activity as tolerated after bedrest (with assistance if necessary).  - Refrain from any strenuous activity or heavy lifting (greater than 10 lbs.).   Follow up: - Contact radiology at [phone] or [phone] if any questions/concerns.  - A physician assistant from radiology will contact you in approximately 1 week.  - If a biopsy was performed at the time of your procedure, your referring physician should receive the results in usually 2-3 days.        1. No stooping,bending or  lifting weights above 10 LBs for 2 weeks. 2. Use walker to ambulate for 2 weeks  3.No driving for 2 weeks. 4.See PRN 2 weeks. 5.Call referring MD for biopsy results.

## 2020-06-18 NOTE — H&P (Signed)
Chief Complaint: Patient was seen in consultation today for Lumbar 4 Kyphoplasty at the request of Dr Francesco Runner   Supervising Physician: Luanne Bras  Patient Status: [facility] - Out-pt  History of Present Illness: Kevin [name] is a 80 y.o. male   Pt has had off and on back pain at least 2 yrs Has more recently been working on deck at home Notice increased back pain MRI 05/01/20 performed at [facility] revealed acute fracture of Lumbar 4 with 30% loss of height Pain meds no real relief Pain mostly with bending- especially to make bed  Referred to Dr Estanislado Pandy for evaluation and management of acute fx L4.  Pt here today for KP procedure  Past Medical History:  Diagnosis Date  . Hyperlipidemia     Past Surgical History:  Procedure Laterality Date  . epidural     back pain  . MINOR HEMORRHOIDECTOMY    . SHOULDER SURGERY    . TRANSURETHRAL RESECTION OF BLADDER TUMOR  2014  . TRANSURETHRAL RESECTION OF BLADDER TUMOR N/A 05/19/2018   Procedure: TRANSURETHRAL RESECTION OF BLADDER TUMOR (TURBT) AND (INTRAVESICAL GEMCITABINE INSTILLED IN BLADDER IN PACU) ;  Surgeon: Franchot Gallo, MD;  Location: [facility];  Service: Urology;  Laterality: N/A;  30 MINS    Allergies: Patient has no known allergies.  Medications: Prior to Admission medications   Medication Sig Start Date End Date Taking? Authorizing Provider  aspirin EC 81 MG tablet Take 81 mg by mouth daily.   Yes [provider]  atorvastatin (LIPITOR) 20 MG tablet Take 20 mg by mouth daily.   Yes [provider]  ibuprofen (ADVIL) 200 MG tablet Take 200 mg by mouth daily as needed for moderate pain.   Yes [provider]     History reviewed. No pertinent family history.  Social History   Socioeconomic History  . Marital status: Married    Spouse name: Not on file  . Number of children: Not on file  . Years of education: Not on file  . Highest education level: Not on file   Occupational History  . Not on file  Tobacco Use  . Smoking status: Never Smoker  . Smokeless tobacco: Never Used  Substance and Sexual Activity  . Alcohol use: Never  . Drug use: Never  . Sexual activity: Not on file  Other Topics Concern  . Not on file  Social History Narrative  . Not on file   Social Determinants of Health   Financial Resource Strain: Not on file  Food Insecurity: Not on file  Transportation Needs: Not on file  Physical Activity: Not on file  Stress: Not on file  Social Connections: Not on file     Review of Systems: A 12 point ROS discussed and pertinent positives are indicated in the HPI above.  All other systems are negative.  Review of Systems  Constitutional: Positive for activity change. Negative for fatigue and fever.  Respiratory: Negative for cough and shortness of breath.   Cardiovascular: Negative for chest pain.  Gastrointestinal: Negative for abdominal pain.  Musculoskeletal: Positive for back pain. Negative for gait problem.  Neurological: Negative for weakness.  Psychiatric/Behavioral: Negative for behavioral problems and confusion.    Vital Signs: BP 136/76   Pulse 77   Temp 97.6 F (36.4 C) (Oral)   Ht 5\' 10"  (1.778 m)   Wt 190 lb (86.2 kg)   SpO2 99%   BMI 27.26 kg/m   Physical Exam Vitals reviewed.  HENT:  Mouth/Throat:     Mouth: Mucous membranes are moist.  Cardiovascular:     Rate and Rhythm: Normal rate and regular rhythm.     Heart sounds: Normal heart sounds.  Pulmonary:     Effort: Pulmonary effort is normal.     Breath sounds: Normal breath sounds.  Abdominal:     Palpations: Abdomen is soft.  Musculoskeletal:        General: Normal range of motion.     Comments: Low back pain  Skin:    General: Skin is warm.  Neurological:     Mental Status: He is alert and oriented to person, place, and time.  Psychiatric:        Behavior: Behavior normal.     Imaging: No results found.  Labs:  CBC: No  results for input(s): WBC, HGB, HCT, PLT in the last 8760 hours.  COAGS: No results for input(s): INR, APTT in the last 8760 hours.  BMP: No results for input(s): NA, K, CL, CO2, GLUCOSE, BUN, CALCIUM, CREATININE, GFRNONAA, GFRAA in the last 8760 hours.  Invalid input(s): CMP  LIVER FUNCTION TESTS: No results for input(s): BILITOT, AST, ALT, ALKPHOS, PROT, ALBUMIN in the last 8760 hours.  TUMOR MARKERS: No results for input(s): AFPTM, CEA, CA199, CHROMGRNA in the last 8760 hours.  Assessment and Plan:  Low back pain- worsened in last few months Acute L4 fracture seen on MRI 04/2020 Pain meds with no relief of pain Scheduled now for Lumbar 4 Kyphoplasty Risks and benefits of Lumbar 4 Kyphoplasty were discussed with the patient including, but not limited to education regarding the natural healing process of compression fractures without intervention, bleeding, infection, cement migration which may cause spinal cord damage, paralysis, pulmonary embolism or even death.  This interventional procedure involves the use of X-rays and because of the nature of the planned procedure, it is possible that we will have prolonged use of X-ray fluoroscopy.  Potential radiation risks to you include (but are not limited to) the following: - A slightly elevated risk for cancer  several years later in life. This risk is typically less than 0.5% percent. This risk is low in comparison to the normal incidence of human cancer, which is 33% for women and 50% for men according to the [employer]. - Radiation induced injury can include skin redness, resembling a rash, tissue breakdown / ulcers and hair loss (which can be temporary or permanent).   The likelihood of either of these occurring depends on the difficulty of the procedure and whether you are sensitive to radiation due to previous procedures, disease, or genetic conditions.   IF your procedure requires a prolonged use of radiation, you  will be notified and given written instructions for further action.  It is your responsibility to monitor the irradiated area for the 2 weeks following the procedure and to notify your physician if you are concerned that you have suffered a radiation induced injury.    All of the patient's questions were answered, patient is agreeable to proceed.  Consent signed and in chart.  Thank you for this interesting consult.  I greatly enjoyed meeting Kevin [name] and look forward to participating in their care.  A copy of this report was sent to the requesting provider on this date.  Electronically Signed: Lavonia Drafts, PA-C 06/18/2020, 10:13 AM   I spent a total of  30 Minutes   in face to face in clinical consultation, greater than 50% of which was counseling/coordinating care  for L4 KP

## 2020-06-18 NOTE — Procedures (Signed)
S/P L4  Balloon KP . ( slight amount of MMA extrusion noted at the ant border of L4). S.Rusti Arizmendi MD

## 2020-06-21 DIAGNOSIS — I779 Disorder of arteries and arterioles, unspecified: Secondary | ICD-10-CM | POA: Diagnosis not present

## 2020-06-21 DIAGNOSIS — Z299 Encounter for prophylactic measures, unspecified: Secondary | ICD-10-CM | POA: Diagnosis not present

## 2020-06-21 DIAGNOSIS — S32040A Wedge compression fracture of fourth lumbar vertebra, initial encounter for closed fracture: Secondary | ICD-10-CM | POA: Diagnosis not present

## 2020-06-21 DIAGNOSIS — C679 Malignant neoplasm of bladder, unspecified: Secondary | ICD-10-CM | POA: Diagnosis not present

## 2020-06-21 LAB — SURGICAL PATHOLOGY

## 2020-06-25 DIAGNOSIS — E2839 Other primary ovarian failure: Secondary | ICD-10-CM | POA: Diagnosis not present

## 2020-06-25 DIAGNOSIS — S32040A Wedge compression fracture of fourth lumbar vertebra, initial encounter for closed fracture: Secondary | ICD-10-CM | POA: Diagnosis not present

## 2020-07-05 DIAGNOSIS — Z1339 Encounter for screening examination for other mental health and behavioral disorders: Secondary | ICD-10-CM | POA: Diagnosis not present

## 2020-07-05 DIAGNOSIS — E7800 Pure hypercholesterolemia, unspecified: Secondary | ICD-10-CM | POA: Diagnosis not present

## 2020-07-05 DIAGNOSIS — E0590 Thyrotoxicosis, unspecified without thyrotoxic crisis or storm: Secondary | ICD-10-CM | POA: Diagnosis not present

## 2020-07-05 DIAGNOSIS — Z0000 Encounter for general adult medical examination without abnormal findings: Secondary | ICD-10-CM | POA: Diagnosis not present

## 2020-07-05 DIAGNOSIS — Z7189 Other specified counseling: Secondary | ICD-10-CM | POA: Diagnosis not present

## 2020-07-05 DIAGNOSIS — Z125 Encounter for screening for malignant neoplasm of prostate: Secondary | ICD-10-CM | POA: Diagnosis not present

## 2020-07-05 DIAGNOSIS — Z79899 Other long term (current) drug therapy: Secondary | ICD-10-CM | POA: Diagnosis not present

## 2020-07-05 DIAGNOSIS — Z299 Encounter for prophylactic measures, unspecified: Secondary | ICD-10-CM | POA: Diagnosis not present

## 2020-07-05 DIAGNOSIS — Z1331 Encounter for screening for depression: Secondary | ICD-10-CM | POA: Diagnosis not present

## 2020-07-30 ENCOUNTER — Ambulatory Visit: Payer: Medicare HMO | Admitting: Dermatology

## 2020-08-08 DIAGNOSIS — Z23 Encounter for immunization: Secondary | ICD-10-CM | POA: Diagnosis not present

## 2020-09-25 ENCOUNTER — Other Ambulatory Visit: Payer: Self-pay

## 2020-09-25 ENCOUNTER — Ambulatory Visit (INDEPENDENT_AMBULATORY_CARE_PROVIDER_SITE_OTHER): Payer: Medicare HMO | Admitting: Dermatology

## 2020-09-25 DIAGNOSIS — L738 Other specified follicular disorders: Secondary | ICD-10-CM

## 2020-09-25 DIAGNOSIS — L729 Follicular cyst of the skin and subcutaneous tissue, unspecified: Secondary | ICD-10-CM

## 2020-09-25 DIAGNOSIS — L918 Other hypertrophic disorders of the skin: Secondary | ICD-10-CM

## 2020-09-25 DIAGNOSIS — D485 Neoplasm of uncertain behavior of skin: Secondary | ICD-10-CM

## 2020-09-25 DIAGNOSIS — D1801 Hemangioma of skin and subcutaneous tissue: Secondary | ICD-10-CM | POA: Diagnosis not present

## 2020-09-25 DIAGNOSIS — L821 Other seborrheic keratosis: Secondary | ICD-10-CM | POA: Diagnosis not present

## 2020-09-25 DIAGNOSIS — L570 Actinic keratosis: Secondary | ICD-10-CM

## 2020-09-25 DIAGNOSIS — Z1283 Encounter for screening for malignant neoplasm of skin: Secondary | ICD-10-CM | POA: Diagnosis not present

## 2020-09-25 NOTE — Patient Instructions (Signed)

## 2020-09-30 ENCOUNTER — Encounter: Payer: Self-pay | Admitting: Dermatology

## 2020-09-30 NOTE — Progress Notes (Signed)
   Follow-Up Visit   Subjective  Kevin [name] is a 80 y.o. male who presents for the following: Annual Exam (Here for annual skin exam. No concerns.).  Annual examination Location:  Duration:  Quality:  Associated Signs/Symptoms: Modifying Factors:  Severity:  Timing: Context:   Objective  Well appearing patient in no apparent distress; mood and affect are within normal limits. Waist Up No signs of atypical moles, or melanoma.   Torso - Posterior (Back) Brown textured 3 to 6 mm flattopped papules  Right Lower Back 1.5 cm deep dermal noninflamed nodule  Left Upper Arm - Anterior 2 mm pedunculated papule  Right Forearm - Posterior 9 mm waxy pink papule       Left Malar Cheek Delled 62mm flesh-colored papule.  Typical dermoscopy    All skin waist up examined.   Assessment & Plan    Skin exam for malignant neoplasm Waist Up  Yearly skin check  Seborrheic keratosis Torso - Posterior (Back)  Leave if stable  Cyst of skin Right Lower Back  Benign okay to leave unless patient wants removed.   Hemangioma of skin Left Flank  Benign okay to leave.  Skin tag Left Upper Arm - Anterior  Patient may choose to have removed anytime  Neoplasm of uncertain behavior of skin Right Forearm - Posterior  Skin / nail biopsy Type of biopsy: tangential   Informed consent: discussed and consent obtained   Timeout: patient name, date of birth, surgical site, and procedure verified   Procedure prep:  Patient was prepped and draped in usual sterile fashion (Non sterile) Prep type:  Chlorhexidine Anesthesia: the lesion was anesthetized in a standard fashion   Anesthetic:  1% lidocaine w/ epinephrine 1-100,000 local infiltration Instrument used: flexible razor blade   Hemostasis achieved with: ferric subsulfate   Outcome: patient tolerated procedure well   Post-procedure details: sterile dressing applied and wound care instructions given   Dressing type:  bandage and petrolatum    Specimen 1 - Surgical pathology Differential Diagnosis: R/O BCC vs SCC  Check Margins: No  Sebaceous hyperplasia Left Malar Cheek  Leave if stable but told of similar appearance of an early basal cell, so if there is growth or bleeding return for biopsy      I, Lavonna Monarch, MD, have reviewed all documentation for this visit.  The documentation on 09/30/20 for the exam, diagnosis, procedures, and orders are all accurate and complete.

## 2020-10-01 ENCOUNTER — Telehealth: Payer: Self-pay | Admitting: Dermatology

## 2020-10-01 NOTE — Telephone Encounter (Signed)
Left message requesting results, ST

## 2020-10-02 ENCOUNTER — Telehealth: Payer: Self-pay | Admitting: Dermatology

## 2020-10-02 NOTE — Telephone Encounter (Signed)
Unhappy no one called him back yesterday with results. Please do so. (He left message)

## 2020-10-02 NOTE — Telephone Encounter (Signed)
Phone call to patient to let him know pathology report is not back yet.

## 2020-10-15 DIAGNOSIS — Z299 Encounter for prophylactic measures, unspecified: Secondary | ICD-10-CM | POA: Diagnosis not present

## 2020-10-15 DIAGNOSIS — H6122 Impacted cerumen, left ear: Secondary | ICD-10-CM | POA: Diagnosis not present

## 2020-10-29 DIAGNOSIS — S63502A Unspecified sprain of left wrist, initial encounter: Secondary | ICD-10-CM | POA: Diagnosis not present

## 2020-11-06 DIAGNOSIS — N1831 Chronic kidney disease, stage 3a: Secondary | ICD-10-CM | POA: Diagnosis not present

## 2020-11-06 DIAGNOSIS — B351 Tinea unguium: Secondary | ICD-10-CM | POA: Diagnosis not present

## 2020-11-06 DIAGNOSIS — E7800 Pure hypercholesterolemia, unspecified: Secondary | ICD-10-CM | POA: Diagnosis not present

## 2020-11-06 DIAGNOSIS — M8580 Other specified disorders of bone density and structure, unspecified site: Secondary | ICD-10-CM | POA: Diagnosis not present

## 2020-11-06 DIAGNOSIS — Z299 Encounter for prophylactic measures, unspecified: Secondary | ICD-10-CM | POA: Diagnosis not present

## 2020-12-31 DIAGNOSIS — M79671 Pain in right foot: Secondary | ICD-10-CM | POA: Diagnosis not present

## 2020-12-31 DIAGNOSIS — M79672 Pain in left foot: Secondary | ICD-10-CM | POA: Diagnosis not present

## 2020-12-31 DIAGNOSIS — L609 Nail disorder, unspecified: Secondary | ICD-10-CM | POA: Diagnosis not present

## 2020-12-31 DIAGNOSIS — B351 Tinea unguium: Secondary | ICD-10-CM | POA: Diagnosis not present

## 2020-12-31 DIAGNOSIS — M79674 Pain in right toe(s): Secondary | ICD-10-CM | POA: Diagnosis not present

## 2020-12-31 DIAGNOSIS — M79675 Pain in left toe(s): Secondary | ICD-10-CM | POA: Diagnosis not present

## 2021-01-14 DIAGNOSIS — R413 Other amnesia: Secondary | ICD-10-CM | POA: Diagnosis not present

## 2021-01-14 DIAGNOSIS — N400 Enlarged prostate without lower urinary tract symptoms: Secondary | ICD-10-CM | POA: Diagnosis not present

## 2021-01-14 DIAGNOSIS — R351 Nocturia: Secondary | ICD-10-CM | POA: Diagnosis not present

## 2021-01-14 DIAGNOSIS — R059 Cough, unspecified: Secondary | ICD-10-CM | POA: Diagnosis not present

## 2021-01-14 DIAGNOSIS — Z299 Encounter for prophylactic measures, unspecified: Secondary | ICD-10-CM | POA: Diagnosis not present

## 2021-01-15 DIAGNOSIS — Z299 Encounter for prophylactic measures, unspecified: Secondary | ICD-10-CM | POA: Diagnosis not present

## 2021-01-15 DIAGNOSIS — N1831 Chronic kidney disease, stage 3a: Secondary | ICD-10-CM | POA: Diagnosis not present

## 2021-01-15 DIAGNOSIS — I779 Disorder of arteries and arterioles, unspecified: Secondary | ICD-10-CM | POA: Diagnosis not present

## 2021-01-15 DIAGNOSIS — E7800 Pure hypercholesterolemia, unspecified: Secondary | ICD-10-CM | POA: Diagnosis not present

## 2021-01-15 DIAGNOSIS — R21 Rash and other nonspecific skin eruption: Secondary | ICD-10-CM | POA: Diagnosis not present

## 2021-01-15 DIAGNOSIS — Z6826 Body mass index (BMI) 26.0-26.9, adult: Secondary | ICD-10-CM | POA: Diagnosis not present

## 2021-01-15 DIAGNOSIS — B351 Tinea unguium: Secondary | ICD-10-CM | POA: Diagnosis not present

## 2021-01-29 DIAGNOSIS — R413 Other amnesia: Secondary | ICD-10-CM | POA: Diagnosis not present

## 2021-01-30 DIAGNOSIS — M79674 Pain in right toe(s): Secondary | ICD-10-CM | POA: Diagnosis not present

## 2021-01-30 DIAGNOSIS — M79675 Pain in left toe(s): Secondary | ICD-10-CM | POA: Diagnosis not present

## 2021-01-30 DIAGNOSIS — M79671 Pain in right foot: Secondary | ICD-10-CM | POA: Diagnosis not present

## 2021-01-30 DIAGNOSIS — Z23 Encounter for immunization: Secondary | ICD-10-CM | POA: Diagnosis not present

## 2021-01-30 DIAGNOSIS — L609 Nail disorder, unspecified: Secondary | ICD-10-CM | POA: Diagnosis not present

## 2021-01-30 DIAGNOSIS — B351 Tinea unguium: Secondary | ICD-10-CM | POA: Diagnosis not present

## 2021-01-30 DIAGNOSIS — M79672 Pain in left foot: Secondary | ICD-10-CM | POA: Diagnosis not present

## 2021-02-04 ENCOUNTER — Encounter: Payer: Self-pay | Admitting: Urology

## 2021-02-04 ENCOUNTER — Other Ambulatory Visit: Payer: Self-pay

## 2021-02-04 ENCOUNTER — Ambulatory Visit (INDEPENDENT_AMBULATORY_CARE_PROVIDER_SITE_OTHER): Payer: Medicare HMO | Admitting: Urology

## 2021-02-04 VITALS — BP 131/56 | HR 88 | Temp 97.4°F

## 2021-02-04 DIAGNOSIS — R351 Nocturia: Secondary | ICD-10-CM | POA: Diagnosis not present

## 2021-02-04 DIAGNOSIS — N401 Enlarged prostate with lower urinary tract symptoms: Secondary | ICD-10-CM | POA: Diagnosis not present

## 2021-02-04 DIAGNOSIS — N138 Other obstructive and reflux uropathy: Secondary | ICD-10-CM

## 2021-02-04 DIAGNOSIS — Z8551 Personal history of malignant neoplasm of bladder: Secondary | ICD-10-CM

## 2021-02-04 DIAGNOSIS — N210 Calculus in bladder: Secondary | ICD-10-CM

## 2021-02-04 NOTE — Progress Notes (Signed)
History of Present Illness: Here for bladder cancer surveillance.    4.24.2017: TURBT of a 2 cm low-grade nonmuscle invasive bladder tumor by Dr. Exie Parody.  Repeat resection for recurrence in August 2017.  Mitomycin was placed after each procedure.   2.6.2020: Repeat TURBT/gemcitabine for 15 mm right posterior bladder wall lesion, low-grade nonmuscle invasive on pathology review.   5.19.2019--he underwent UroLift for BPH symptoms.  6 implants were placed.  10.25.2022: Now back on tamsulosin because of nocturia x3-4.  No gross hematuria.  Tamsulosin has improved his nighttime voiding.  He does drink coffee in the evening.     Past Medical History:  Diagnosis Date   Hyperlipidemia     Past Surgical History:  Procedure Laterality Date   epidural     back pain   IR CT SPINE LTD  06/18/2020   IR KYPHO LUMBAR INC FX REDUCE BONE BX UNI/BIL CANNULATION INC/IMAGING  06/18/2020   MINOR HEMORRHOIDECTOMY     SHOULDER SURGERY     TRANSURETHRAL RESECTION OF BLADDER TUMOR  2014   TRANSURETHRAL RESECTION OF BLADDER TUMOR N/A 05/19/2018   Procedure: TRANSURETHRAL RESECTION OF BLADDER TUMOR (TURBT) AND (INTRAVESICAL GEMCITABINE INSTILLED IN BLADDER IN PACU) ;  Surgeon: Franchot Gallo, MD;  Location: [facility];  Service: Urology;  Laterality: N/A;  30 MINS    Home Medications:  Allergies as of 02/04/2021   No Known Allergies      Medication List        Accurate as of February 04, 2021  8:47 AM. If you have any questions, ask your nurse or doctor.          aspirin EC 81 MG tablet Take 81 mg by mouth daily.   atorvastatin 20 MG tablet Commonly known as: LIPITOR Take 20 mg by mouth daily.   ibuprofen 200 MG tablet Commonly known as: ADVIL Take 200 mg by mouth daily as needed for moderate pain.        Allergies: No Known Allergies  No family history on file.  Social History:  reports that he has never smoked. He has never used smokeless tobacco. He reports that he does not drink  alcohol and does not use drugs.  ROS: A complete review of systems was performed.  All systems are negative except for pertinent findings as noted.  Physical Exam:  Vital signs in last 24 hours: There were no vitals taken for this visit. Constitutional:  Alert and oriented, No acute distress Cardiovascular: Regular rate  Respiratory: Normal respiratory effort GI: Abdomen is soft, nontender, nondistended, no abdominal masses. No CVAT.  Genitourinary: Normal male phallus, testes are descended bilaterally and non-tender and without masses, scrotum is normal in appearance without lesions or masses, perineum is normal on inspection. Lymphatic: No lymphadenopathy Neurologic: Grossly intact, no focal deficits Psychiatric: Normal mood and affect  I have reviewed prior pt notes  I have reviewed notes from referring/previous physicians  I have reviewed urinalysis results  Cystoscopy Procedure Note:  Indication: bladder cancer surveillance  After informed consent and discussion of the procedure and its risks, Kevin [name] was positioned and prepped in the standard fashion.  Cystoscopy was performed with a flexible cystoscope.   Findings: Urethra: No stricture/lesions Prostate: Bilobar hyperplasia.  Lateral lobes do not coapt. Bladder neck: Open Ureteral orifices: Normal in location, configuration Bladder: Resection scars noted.  No lesions.  Small calculus at the bladder neck on the right  The patient tolerated the procedure well.      Impression/Assessment:  1.  Low-grade, nonmuscle invasive bladder cancer, status post initial resection in 2017.  Repeat resection in 2020.  No evidence of recurrence today  2.  BPH, status post UroLift 2 years ago.  He does have nocturia that may well be secondary to caffeine intake  Plan:  1.  I recommended that he limit his nighttime caffeine.  If that helps his nocturia, come off his tamsulosin  2.  I will see back in 1 year for repeat  cystoscopy  3.  For now, I do not think we need to treat the small stone in his bladder

## 2021-02-04 NOTE — Progress Notes (Signed)
Urological Symptom Review  Patient is experiencing the following symptoms: Get up at night to urinate Stream starts and stops   Review of Systems  Gastrointestinal (upper)  : Negative for upper GI symptoms  Gastrointestinal (lower) : Negative for lower GI symptoms  Constitutional : Negative for symptoms  Skin: Negative for skin symptoms  Eyes: Negative for eye symptoms  Ear/Nose/Throat : Negative for Ear/Nose/Throat symptoms  Hematologic/Lymphatic: Negative for Hematologic/Lymphatic symptoms  Cardiovascular : Negative for cardiovascular symptoms  Respiratory : Negative for respiratory symptoms  Endocrine: Negative for endocrine symptoms  Musculoskeletal: Negative for musculoskeletal symptoms  Neurological: Negative for neurological symptoms  Psychologic: Negative for psychiatric symptoms

## 2021-02-05 LAB — URINALYSIS, ROUTINE W REFLEX MICROSCOPIC
Bilirubin, UA: NEGATIVE
Glucose, UA: NEGATIVE
Ketones, UA: NEGATIVE
Leukocytes,UA: NEGATIVE
Nitrite, UA: NEGATIVE
Protein,UA: NEGATIVE
RBC, UA: NEGATIVE
Specific Gravity, UA: 1.025 (ref 1.005–1.030)
Urobilinogen, Ur: 0.2 mg/dL (ref 0.2–1.0)
pH, UA: 5.5 (ref 5.0–7.5)

## 2021-02-12 DIAGNOSIS — N529 Male erectile dysfunction, unspecified: Secondary | ICD-10-CM | POA: Diagnosis not present

## 2021-02-12 DIAGNOSIS — J320 Chronic maxillary sinusitis: Secondary | ICD-10-CM | POA: Diagnosis not present

## 2021-02-12 DIAGNOSIS — R413 Other amnesia: Secondary | ICD-10-CM | POA: Diagnosis not present

## 2021-02-12 DIAGNOSIS — Z87891 Personal history of nicotine dependence: Secondary | ICD-10-CM | POA: Diagnosis not present

## 2021-02-12 DIAGNOSIS — I6529 Occlusion and stenosis of unspecified carotid artery: Secondary | ICD-10-CM | POA: Diagnosis not present

## 2021-02-12 DIAGNOSIS — Z299 Encounter for prophylactic measures, unspecified: Secondary | ICD-10-CM | POA: Diagnosis not present

## 2021-02-27 DIAGNOSIS — M79674 Pain in right toe(s): Secondary | ICD-10-CM | POA: Diagnosis not present

## 2021-02-27 DIAGNOSIS — M79672 Pain in left foot: Secondary | ICD-10-CM | POA: Diagnosis not present

## 2021-02-27 DIAGNOSIS — M79671 Pain in right foot: Secondary | ICD-10-CM | POA: Diagnosis not present

## 2021-02-27 DIAGNOSIS — B351 Tinea unguium: Secondary | ICD-10-CM | POA: Diagnosis not present

## 2021-02-27 DIAGNOSIS — M79675 Pain in left toe(s): Secondary | ICD-10-CM | POA: Diagnosis not present

## 2021-04-09 DIAGNOSIS — J309 Allergic rhinitis, unspecified: Secondary | ICD-10-CM | POA: Diagnosis not present

## 2021-04-09 DIAGNOSIS — R5383 Other fatigue: Secondary | ICD-10-CM | POA: Diagnosis not present

## 2021-04-09 DIAGNOSIS — R079 Chest pain, unspecified: Secondary | ICD-10-CM | POA: Diagnosis not present

## 2021-04-09 DIAGNOSIS — Z6826 Body mass index (BMI) 26.0-26.9, adult: Secondary | ICD-10-CM | POA: Diagnosis not present

## 2021-04-09 DIAGNOSIS — Z299 Encounter for prophylactic measures, unspecified: Secondary | ICD-10-CM | POA: Diagnosis not present

## 2021-04-21 DIAGNOSIS — R079 Chest pain, unspecified: Secondary | ICD-10-CM | POA: Diagnosis not present

## 2021-05-01 ENCOUNTER — Ambulatory Visit: Payer: Medicare HMO | Admitting: Cardiology

## 2021-05-01 ENCOUNTER — Other Ambulatory Visit: Payer: Self-pay

## 2021-05-01 ENCOUNTER — Encounter: Payer: Self-pay | Admitting: Cardiology

## 2021-05-01 VITALS — BP 133/68 | HR 73 | Temp 98.4°F | Resp 17 | Ht 70.0 in | Wt 184.6 lb

## 2021-05-01 DIAGNOSIS — E785 Hyperlipidemia, unspecified: Secondary | ICD-10-CM | POA: Diagnosis not present

## 2021-05-01 DIAGNOSIS — R9431 Abnormal electrocardiogram [ECG] [EKG]: Secondary | ICD-10-CM | POA: Diagnosis not present

## 2021-05-01 DIAGNOSIS — N1831 Chronic kidney disease, stage 3a: Secondary | ICD-10-CM | POA: Diagnosis not present

## 2021-05-01 DIAGNOSIS — E7800 Pure hypercholesterolemia, unspecified: Secondary | ICD-10-CM | POA: Diagnosis not present

## 2021-05-01 DIAGNOSIS — I200 Unstable angina: Secondary | ICD-10-CM

## 2021-05-01 MED ORDER — METOPROLOL TARTRATE 25 MG PO TABS: 25.0000 mg | ORAL_TABLET | Freq: Two times a day (BID) | ORAL | 2 refills | Status: DC

## 2021-05-01 MED ORDER — ATORVASTATIN CALCIUM 80 MG PO TABS: 80.0000 mg | ORAL_TABLET | Freq: Every day | ORAL | 3 refills | Status: DC

## 2021-05-01 MED ORDER — ISOSORBIDE MONONITRATE ER 60 MG PO TB24: 30.0000 mg | ORAL_TABLET | Freq: Every day | ORAL | 2 refills | Status: DC

## 2021-05-01 NOTE — Progress Notes (Signed)
Primary Physician/Referring:  [name], MD  Patient ID: Kevin [name], male    DOB: 1940/06/13, 81 y.o.   MRN: [id number]  Chief Complaint  Patient presents with   New Patient (Initial Visit)   Chest Pain   HPI:    Kevin [name]  is a 81 y.o. Caucasian male patient referred to me for evaluation of chest pain and abnormal EKG.  Past medical history significant for hyperlipidemia.  Symptoms started 3 weeks ago to 4 weeks ago, with minimal exertional activity, EKG revealed new ST segment depression in the inferolateral leads on 04/09/2021.  Is accompanied by his wife, states that he went last evening while he was just walking few steps uphill he had chest tightness in the form of chest burning in the retrosternal region.  She thinks he is also having shortness of breath although patient denies.  No other associated symptoms.  Past Medical History:  Diagnosis Date   Hyperlipidemia    Past Surgical History:  Procedure Laterality Date   epidural     back pain   IR CT SPINE LTD  06/18/2020   IR KYPHO LUMBAR INC FX REDUCE BONE BX UNI/BIL CANNULATION INC/IMAGING  06/18/2020   MINOR HEMORRHOIDECTOMY     SHOULDER SURGERY     TRANSURETHRAL RESECTION OF BLADDER TUMOR  2014   TRANSURETHRAL RESECTION OF BLADDER TUMOR N/A 05/19/2018   Procedure: TRANSURETHRAL RESECTION OF BLADDER TUMOR (TURBT) AND (INTRAVESICAL GEMCITABINE INSTILLED IN BLADDER IN PACU) ;  Surgeon: Franchot Gallo, MD;  Location: [facility];  Service: Urology;  Laterality: N/A;  58 MINS   Family History  Problem Relation Age of Onset   Alzheimer's disease Mother 2   Congestive Heart Failure Father 59   Heart disease Brother 16   Heart failure Brother 76   Heart failure Brother 57    Social History   Tobacco Use   Smoking status: Former    Packs/day: 1.00    Years: 30.00    Pack years: 30.00    Types: Cigarettes    Quit date: 04/14/1995    Years since quitting: 26.0   Smokeless tobacco: Never  Substance Use Topics    Alcohol use: Never   Marital Status: Married  ROS  Review of Systems  Cardiovascular:  Positive for chest pain. Negative for dyspnea on exertion and leg swelling.  Gastrointestinal:  Negative for melena.  Objective  Blood pressure 133/68, pulse 73, temperature 98.4 F (36.9 C), temperature source Temporal, resp. rate 17, height 5\' 10"  (1.778 m), weight 184 lb 9.6 oz (83.7 kg), SpO2 96 %. Body mass index is 26.49 kg/m.  Vitals with BMI 05/01/2021 02/04/2021 06/18/2020  Height 5\' 10"  - -  Weight 184 lbs 10 oz - -  BMI 37.85 - -  Systolic 885 027 741  Diastolic 68 56 85  Pulse 73 88 65    Physical Exam Neck:     Vascular: No carotid bruit or JVD.  Cardiovascular:     Rate and Rhythm: Normal rate and regular rhythm.     Pulses: Intact distal pulses.     Heart sounds: Normal heart sounds. No murmur heard.   No gallop.  Pulmonary:     Effort: Pulmonary effort is normal.     Breath sounds: Normal breath sounds.  Abdominal:     General: Bowel sounds are normal.     Palpations: Abdomen is soft.  Musculoskeletal:        General: No swelling.     Laboratory examination:  Recent Labs    06/18/20 0957  NA 133*  K 4.3  CL 103  CO2 21*  GLUCOSE 98  BUN 13  CREATININE 1.36*  CALCIUM 9.0  GFRNONAA 54*   CrCl cannot be calculated (Patient's most recent lab result is older than the maximum 21 days allowed.).  CMP Latest Ref Rng & Units 06/18/2020 05/17/2018  Glucose 70 - 99 mg/dL 98 107(H)  BUN 8 - 23 mg/dL 13 21  Creatinine 0.61 - 1.24 mg/dL 1.36(H) 1.20  Sodium 135 - 145 mmol/L 133(L) 136  Potassium 3.5 - 5.1 mmol/L 4.3 3.6  Chloride 98 - 111 mmol/L 103 103  CO2 22 - 32 mmol/L 21(L) 25  Calcium 8.9 - 10.3 mg/dL 9.0 8.9   CBC Latest Ref Rng & Units 06/18/2020 05/17/2018  WBC 4.0 - 10.5 K/uL 6.3 8.3  Hemoglobin 13.0 - 17.0 g/dL 13.8 14.5  Hematocrit 39.0 - 52.0 % 39.7 44.3  Platelets 150 - 400 K/uL 231 223    Lipid Panel No results for input(s): CHOL, TRIG, LDLCALC, VLDL,  HDL, CHOLHDL, LDLDIRECT in the last 8760 hours. Lipid Panel  No results found for: CHOL, TRIG, HDL, CHOLHDL, VLDL, LDLCALC, LDLDIRECT, LABVLDL   HEMOGLOBIN A1C No results found for: HGBA1C, MPG TSH No results for input(s): TSH in the last 8760 hours.  External labs:   Labs 07/05/2020:  Total cholesterol 149, triglycerides 134, HDL 40, LDL 85. Medications and allergies  No Known Allergies   Medication prior to this encounter:   Outpatient Medications Prior to Visit  Medication Sig Dispense Refill   aspirin EC 81 MG tablet Take 81 mg by mouth daily.     Calcium Carbonate-Vit D-Min (CALCIUM 1200 PO) Take 1 tablet by mouth daily.     Cholecalciferol (D-3-5) 125 MCG (5000 UT) capsule Take 5,000 Units by mouth daily.     ibuprofen (ADVIL) 200 MG tablet Take 200 mg by mouth daily as needed for moderate pain.     nitroGLYCERIN (NITROSTAT) 0.4 MG SL tablet Place 1 tablet under the tongue as needed.     atorvastatin (LIPITOR) 20 MG tablet Take 20 mg by mouth daily.     ciprofloxacin (CIPRO) tablet 500 mg      No facility-administered medications prior to visit.     Medication list after today's encounter   Current Outpatient Medications  Medication Instructions   aspirin EC 81 mg, Oral, Daily   atorvastatin (LIPITOR) 80 mg, Oral, Daily   Calcium Carbonate-Vit D-Min (CALCIUM 1200 PO) 1 tablet, Oral, Daily   D-3-5 5,000 Units, Oral, Daily   ibuprofen (ADVIL) 200 mg, Oral, Daily PRN   isosorbide mononitrate (IMDUR) 30 mg, Oral, Daily   metoprolol tartrate (LOPRESSOR) 25 mg, Oral, 2 times daily   nitroGLYCERIN (NITROSTAT) 0.4 MG SL tablet 1 tablet, Sublingual, As needed    Radiology:   No results found.  Cardiac Studies:   PCP echocardiogram 04/21/2021: Normal LV size, LV thickness and diastolic dysfunction noted.  LVEF low normal.  No wall motion abnormality. Mild left atrial enlargement. Mild mitral regurgitation. Pericardium is normal.  No pericardial effusion.  EKG:    EKG 05/01/2021: Normal sinus rhythm at rate of 64 bpm, normal axis, nonspecific T abnormality.  PACs (2).  Compared to 04/09/2021, inferior and lateral ST segment depression suggestive of ischemia no longer present.  Assessment     ICD-10-CM   1. Unstable angina (HCC)  I20.0 EKG 12-Lead    isosorbide mononitrate (IMDUR) 60 MG 24 hr tablet  metoprolol tartrate (LOPRESSOR) 25 MG tablet    CBC    Basic metabolic panel    LDL cholesterol, direct    2. Abnormal EKG  R94.31     3. Hypercholesteremia  E78.00 atorvastatin (LIPITOR) 80 MG tablet    4. Stage 3a chronic kidney disease (HCC)  N18.31        Medications Discontinued During This Encounter  Medication Reason   ciprofloxacin (CIPRO) tablet 500 mg    atorvastatin (LIPITOR) 20 MG tablet Reorder    Meds ordered this encounter  Medications   atorvastatin (LIPITOR) 80 MG tablet    Sig: Take 1 tablet (80 mg total) by mouth daily.    Dispense:  90 tablet    Refill:  3   isosorbide mononitrate (IMDUR) 60 MG 24 hr tablet    Sig: Take 0.5 tablets (30 mg total) by mouth daily.    Dispense:  15 tablet    Refill:  2   metoprolol tartrate (LOPRESSOR) 25 MG tablet    Sig: Take 1 tablet (25 mg total) by mouth 2 (two) times daily.    Dispense:  60 tablet    Refill:  2   Orders Placed This Encounter  Procedures   CBC   Basic metabolic panel   LDL cholesterol, direct   EKG 12-Lead   Recommendations:   Kevin [name] is a 81 y.o. Caucasian male patient referred to me for evaluation of chest pain and abnormal EKG.  Past medical history significant for hyperlipidemia.  Symptoms started 3 weeks ago to 4 weeks ago, with minimal exertional activity, EKG revealed new ST segment depression in the inferolateral leads on 04/09/2021.  Symptoms are very typical for unstable angina pectoris.  Fortunately symptoms are improved and stabilized.  Still very concerned for precipitation of unstable angina and acute coronary event.  Increase  atorvastatin to 80 mg daily, I have added metoprolol tartrate 25 mg p.o. twice daily, advised him to use sublingual nitroglycerin if he has recurrence of chest pain.  I will also add Imdur 60 mg daily. Patient instructed not to do heavy lifting, heavy exertional activity, swimming until evaluation is complete.  Patient instructed to call if symptoms worse or to go to the ED for further evaluation.  Schedule for cardiac catheterization, and possible angioplasty. We discussed regarding risks, benefits, alternatives to this including stress testing, CTA and continued medical therapy. Patient wants to proceed. Understands <1-2% risk of death, stroke, MI, urgent CABG, bleeding, infection, renal failure but not limited to these.  His wife is present at the bedside.  I reviewed the echocardiogram performed by his PCP, no wall motion abnormality although LVEF is low normal.  I will make further recommendation after the test.  He does have underlying chronic kidney disease with stage IIIa, will hydrate him prior to cardiac catheterization.  With preprocedural labs, I will also obtain a direct LDL today as patient has already eaten this morning.  This was a >60-minute office visit encounter with additional 10 minutes discussions regarding catheterization, coordination of care, review of external labs.    [name], MD, [facility] 05/01/2021, 11:24 AM Office: [phone]

## 2021-05-01 NOTE — H&P (View-Only) (Signed)
Primary Physician/Referring:  [name], MD  Patient ID: Kevin [name], male    DOB: 12-13-1940, 81 y.o.   MRN: [id number]  Chief Complaint  Patient presents with   New Patient (Initial Visit)   Chest Pain   HPI:    Kevin [name]  is a 81 y.o. Caucasian male patient referred to me for evaluation of chest pain and abnormal EKG.  Past medical history significant for hyperlipidemia.  Symptoms started 3 weeks ago to 4 weeks ago, with minimal exertional activity, EKG revealed new ST segment depression in the inferolateral leads on 04/09/2021.  Is accompanied by his wife, states that he went last evening while he was just walking few steps uphill he had chest tightness in the form of chest burning in the retrosternal region.  She thinks he is also having shortness of breath although patient denies.  No other associated symptoms.  Past Medical History:  Diagnosis Date   Hyperlipidemia    Past Surgical History:  Procedure Laterality Date   epidural     back pain   IR CT SPINE LTD  06/18/2020   IR KYPHO LUMBAR INC FX REDUCE BONE BX UNI/BIL CANNULATION INC/IMAGING  06/18/2020   MINOR HEMORRHOIDECTOMY     SHOULDER SURGERY     TRANSURETHRAL RESECTION OF BLADDER TUMOR  2014   TRANSURETHRAL RESECTION OF BLADDER TUMOR N/A 05/19/2018   Procedure: TRANSURETHRAL RESECTION OF BLADDER TUMOR (TURBT) AND (INTRAVESICAL GEMCITABINE INSTILLED IN BLADDER IN PACU) ;  Surgeon: Franchot Gallo, MD;  Location: [facility];  Service: Urology;  Laterality: N/A;  39 MINS   Family History  Problem Relation Age of Onset   Alzheimer's disease Mother 45   Congestive Heart Failure Father 55   Heart disease Brother 35   Heart failure Brother 35   Heart failure Brother 54    Social History   Tobacco Use   Smoking status: Former    Packs/day: 1.00    Years: 30.00    Pack years: 30.00    Types: Cigarettes    Quit date: 04/14/1995    Years since quitting: 26.0   Smokeless tobacco: Never  Substance Use Topics    Alcohol use: Never   Marital Status: Married  ROS  Review of Systems  Cardiovascular:  Positive for chest pain. Negative for dyspnea on exertion and leg swelling.  Gastrointestinal:  Negative for melena.  Objective  Blood pressure 133/68, pulse 73, temperature 98.4 F (36.9 C), temperature source Temporal, resp. rate 17, height 5\' 10"  (1.778 m), weight 184 lb 9.6 oz (83.7 kg), SpO2 96 %. Body mass index is 26.49 kg/m.  Vitals with BMI 05/01/2021 02/04/2021 06/18/2020  Height 5\' 10"  - -  Weight 184 lbs 10 oz - -  BMI 25.95 - -  Systolic [phone]  Diastolic 68 56 85  Pulse 73 88 65    Physical Exam Neck:     Vascular: No carotid bruit or JVD.  Cardiovascular:     Rate and Rhythm: Normal rate and regular rhythm.     Pulses: Intact distal pulses.     Heart sounds: Normal heart sounds. No murmur heard.   No gallop.  Pulmonary:     Effort: Pulmonary effort is normal.     Breath sounds: Normal breath sounds.  Abdominal:     General: Bowel sounds are normal.     Palpations: Abdomen is soft.  Musculoskeletal:        General: No swelling.     Laboratory examination:  Recent Labs    06/18/20 0957  NA 133*  K 4.3  CL 103  CO2 21*  GLUCOSE 98  BUN 13  CREATININE 1.36*  CALCIUM 9.0  GFRNONAA 54*   CrCl cannot be calculated (Patient's most recent lab result is older than the maximum 21 days allowed.).  CMP Latest Ref Rng & Units 06/18/2020 05/17/2018  Glucose 70 - 99 mg/dL 98 107(H)  BUN 8 - 23 mg/dL 13 21  Creatinine 0.61 - 1.24 mg/dL 1.36(H) 1.20  Sodium 135 - 145 mmol/L 133(L) 136  Potassium 3.5 - 5.1 mmol/L 4.3 3.6  Chloride 98 - 111 mmol/L 103 103  CO2 22 - 32 mmol/L 21(L) 25  Calcium 8.9 - 10.3 mg/dL 9.0 8.9   CBC Latest Ref Rng & Units 06/18/2020 05/17/2018  WBC 4.0 - 10.5 K/uL 6.3 8.3  Hemoglobin 13.0 - 17.0 g/dL 13.8 14.5  Hematocrit 39.0 - 52.0 % 39.7 44.3  Platelets 150 - 400 K/uL 231 223    Lipid Panel No results for input(s): CHOL, TRIG, LDLCALC, VLDL,  HDL, CHOLHDL, LDLDIRECT in the last 8760 hours. Lipid Panel  No results found for: CHOL, TRIG, HDL, CHOLHDL, VLDL, LDLCALC, LDLDIRECT, LABVLDL   HEMOGLOBIN A1C No results found for: HGBA1C, MPG TSH No results for input(s): TSH in the last 8760 hours.  External labs:   Labs 07/05/2020:  Total cholesterol 149, triglycerides 134, HDL 40, LDL 85. Medications and allergies  No Known Allergies   Medication prior to this encounter:   Outpatient Medications Prior to Visit  Medication Sig Dispense Refill   aspirin EC 81 MG tablet Take 81 mg by mouth daily.     Calcium Carbonate-Vit D-Min (CALCIUM 1200 PO) Take 1 tablet by mouth daily.     Cholecalciferol (D-3-5) 125 MCG (5000 UT) capsule Take 5,000 Units by mouth daily.     ibuprofen (ADVIL) 200 MG tablet Take 200 mg by mouth daily as needed for moderate pain.     nitroGLYCERIN (NITROSTAT) 0.4 MG SL tablet Place 1 tablet under the tongue as needed.     atorvastatin (LIPITOR) 20 MG tablet Take 20 mg by mouth daily.     ciprofloxacin (CIPRO) tablet 500 mg      No facility-administered medications prior to visit.     Medication list after today's encounter   Current Outpatient Medications  Medication Instructions   aspirin EC 81 mg, Oral, Daily   atorvastatin (LIPITOR) 80 mg, Oral, Daily   Calcium Carbonate-Vit D-Min (CALCIUM 1200 PO) 1 tablet, Oral, Daily   D-3-5 5,000 Units, Oral, Daily   ibuprofen (ADVIL) 200 mg, Oral, Daily PRN   isosorbide mononitrate (IMDUR) 30 mg, Oral, Daily   metoprolol tartrate (LOPRESSOR) 25 mg, Oral, 2 times daily   nitroGLYCERIN (NITROSTAT) 0.4 MG SL tablet 1 tablet, Sublingual, As needed    Radiology:   No results found.  Cardiac Studies:   PCP echocardiogram 04/21/2021: Normal LV size, LV thickness and diastolic dysfunction noted.  LVEF low normal.  No wall motion abnormality. Mild left atrial enlargement. Mild mitral regurgitation. Pericardium is normal.  No pericardial effusion.  EKG:    EKG 05/01/2021: Normal sinus rhythm at rate of 64 bpm, normal axis, nonspecific T abnormality.  PACs (2).  Compared to 04/09/2021, inferior and lateral ST segment depression suggestive of ischemia no longer present.  Assessment     ICD-10-CM   1. Unstable angina (HCC)  I20.0 EKG 12-Lead    isosorbide mononitrate (IMDUR) 60 MG 24 hr tablet  metoprolol tartrate (LOPRESSOR) 25 MG tablet    CBC    Basic metabolic panel    LDL cholesterol, direct    2. Abnormal EKG  R94.31     3. Hypercholesteremia  E78.00 atorvastatin (LIPITOR) 80 MG tablet    4. Stage 3a chronic kidney disease (HCC)  N18.31        Medications Discontinued During This Encounter  Medication Reason   ciprofloxacin (CIPRO) tablet 500 mg    atorvastatin (LIPITOR) 20 MG tablet Reorder    Meds ordered this encounter  Medications   atorvastatin (LIPITOR) 80 MG tablet    Sig: Take 1 tablet (80 mg total) by mouth daily.    Dispense:  90 tablet    Refill:  3   isosorbide mononitrate (IMDUR) 60 MG 24 hr tablet    Sig: Take 0.5 tablets (30 mg total) by mouth daily.    Dispense:  15 tablet    Refill:  2   metoprolol tartrate (LOPRESSOR) 25 MG tablet    Sig: Take 1 tablet (25 mg total) by mouth 2 (two) times daily.    Dispense:  60 tablet    Refill:  2   Orders Placed This Encounter  Procedures   CBC   Basic metabolic panel   LDL cholesterol, direct   EKG 12-Lead   Recommendations:   Jacky Dross is a 81 y.o. Caucasian male patient referred to me for evaluation of chest pain and abnormal EKG.  Past medical history significant for hyperlipidemia.  Symptoms started 3 weeks ago to 4 weeks ago, with minimal exertional activity, EKG revealed new ST segment depression in the inferolateral leads on 04/09/2021.  Symptoms are very typical for unstable angina pectoris.  Fortunately symptoms are improved and stabilized.  Still very concerned for precipitation of unstable angina and acute coronary event.  Increase  atorvastatin to 80 mg daily, I have added metoprolol tartrate 25 mg p.o. twice daily, advised him to use sublingual nitroglycerin if he has recurrence of chest pain.  I will also add Imdur 60 mg daily. Patient instructed not to do heavy lifting, heavy exertional activity, swimming until evaluation is complete.  Patient instructed to call if symptoms worse or to go to the ED for further evaluation.  Schedule for cardiac catheterization, and possible angioplasty. We discussed regarding risks, benefits, alternatives to this including stress testing, CTA and continued medical therapy. Patient wants to proceed. Understands <1-2% risk of death, stroke, MI, urgent CABG, bleeding, infection, renal failure but not limited to these.  His wife is present at the bedside.  I reviewed the echocardiogram performed by his PCP, no wall motion abnormality although LVEF is low normal.  I will make further recommendation after the test.  He does have underlying chronic kidney disease with stage IIIa, will hydrate him prior to cardiac catheterization.  With preprocedural labs, I will also obtain a direct LDL today as patient has already eaten this morning.  This was a >60-minute office visit encounter with additional 10 minutes discussions regarding catheterization, coordination of care, review of external labs.    [name], MD, [facility] 05/01/2021, 11:24 AM Office: [phone]

## 2021-05-02 LAB — BASIC METABOLIC PANEL
BUN/Creatinine Ratio: 14 (ref 10–24)
BUN: 20 mg/dL (ref 8–27)
CO2: 23 mmol/L (ref 20–29)
Calcium: 9.7 mg/dL (ref 8.6–10.2)
Chloride: 102 mmol/L (ref 96–106)
Creatinine, Ser: 1.39 mg/dL — ABNORMAL HIGH (ref 0.76–1.27)
Glucose: 90 mg/dL (ref 70–99)
Potassium: 4.6 mmol/L (ref 3.5–5.2)
Sodium: 139 mmol/L (ref 134–144)
eGFR: 52 mL/min/{1.73_m2} — ABNORMAL LOW (ref >59)

## 2021-05-02 LAB — LDL CHOLESTEROL, DIRECT: LDL Direct: 80 mg/dL (ref 0–99)

## 2021-05-02 LAB — CBC
Hematocrit: 41.2 % (ref 37.5–51.0)
Hemoglobin: 14.5 g/dL (ref 13.0–17.7)
MCH: 31.7 pg (ref 26.6–33.0)
MCHC: 35.2 g/dL (ref 31.5–35.7)
MCV: 90 fL (ref 79–97)
Platelets: 236 10*3/uL (ref 150–450)
RBC: 4.57 x10E6/uL (ref 4.14–5.80)
RDW: 11.8 % (ref 11.6–15.4)
WBC: 7.3 10*3/uL (ref 3.4–10.8)

## 2021-05-02 NOTE — Progress Notes (Signed)
Labs stable for coronary angiogram

## 2021-05-05 DIAGNOSIS — I2510 Atherosclerotic heart disease of native coronary artery without angina pectoris: Secondary | ICD-10-CM | POA: Diagnosis present

## 2021-05-05 DIAGNOSIS — I25110 Atherosclerotic heart disease of native coronary artery with unstable angina pectoris: Secondary | ICD-10-CM | POA: Diagnosis present

## 2021-05-06 ENCOUNTER — Other Ambulatory Visit: Payer: Self-pay

## 2021-05-06 ENCOUNTER — Encounter (HOSPITAL_COMMUNITY)
Admission: RE | Disposition: A | Discharge status: Home / Self Care | Payer: Self-pay | Source: Home / Self Care | Attending: Cardiology

## 2021-05-06 ENCOUNTER — Inpatient Hospital Stay (HOSPITAL_COMMUNITY)
Admission: RE | Admit: 2021-05-06 | Discharge: 2021-05-17 | DRG: 234 | Disposition: A | Discharge status: Home / Self Care | Payer: Medicare HMO | Attending: Thoracic Surgery (Cardiothoracic Vascular Surgery) | Admitting: Thoracic Surgery (Cardiothoracic Vascular Surgery)

## 2021-05-06 DIAGNOSIS — R001 Bradycardia, unspecified: Secondary | ICD-10-CM | POA: Diagnosis not present

## 2021-05-06 DIAGNOSIS — E871 Hypo-osmolality and hyponatremia: Secondary | ICD-10-CM | POA: Diagnosis not present

## 2021-05-06 DIAGNOSIS — I2510 Atherosclerotic heart disease of native coronary artery without angina pectoris: Secondary | ICD-10-CM | POA: Diagnosis present

## 2021-05-06 DIAGNOSIS — Z820 Family history of epilepsy and other diseases of the nervous system: Secondary | ICD-10-CM

## 2021-05-06 DIAGNOSIS — Z79899 Other long term (current) drug therapy: Secondary | ICD-10-CM

## 2021-05-06 DIAGNOSIS — D62 Acute posthemorrhagic anemia: Secondary | ICD-10-CM | POA: Diagnosis not present

## 2021-05-06 DIAGNOSIS — N179 Acute kidney failure, unspecified: Secondary | ICD-10-CM | POA: Diagnosis not present

## 2021-05-06 DIAGNOSIS — I517 Cardiomegaly: Secondary | ICD-10-CM | POA: Diagnosis not present

## 2021-05-06 DIAGNOSIS — R000 Tachycardia, unspecified: Secondary | ICD-10-CM | POA: Diagnosis not present

## 2021-05-06 DIAGNOSIS — Z09 Encounter for follow-up examination after completed treatment for conditions other than malignant neoplasm: Secondary | ICD-10-CM

## 2021-05-06 DIAGNOSIS — Z20822 Contact with and (suspected) exposure to covid-19: Secondary | ICD-10-CM | POA: Diagnosis not present

## 2021-05-06 DIAGNOSIS — C3411 Malignant neoplasm of upper lobe, right bronchus or lung: Secondary | ICD-10-CM | POA: Diagnosis not present

## 2021-05-06 DIAGNOSIS — N1831 Chronic kidney disease, stage 3a: Secondary | ICD-10-CM | POA: Diagnosis not present

## 2021-05-06 DIAGNOSIS — E8770 Fluid overload, unspecified: Secondary | ICD-10-CM | POA: Diagnosis not present

## 2021-05-06 DIAGNOSIS — I10 Essential (primary) hypertension: Secondary | ICD-10-CM | POA: Diagnosis not present

## 2021-05-06 DIAGNOSIS — Z9911 Dependence on respirator [ventilator] status: Secondary | ICD-10-CM | POA: Diagnosis not present

## 2021-05-06 DIAGNOSIS — E785 Hyperlipidemia, unspecified: Secondary | ICD-10-CM

## 2021-05-06 DIAGNOSIS — R918 Other nonspecific abnormal finding of lung field: Secondary | ICD-10-CM

## 2021-05-06 DIAGNOSIS — Z7982 Long term (current) use of aspirin: Secondary | ICD-10-CM

## 2021-05-06 DIAGNOSIS — K5900 Constipation, unspecified: Secondary | ICD-10-CM | POA: Diagnosis not present

## 2021-05-06 DIAGNOSIS — Z4682 Encounter for fitting and adjustment of non-vascular catheter: Secondary | ICD-10-CM | POA: Diagnosis not present

## 2021-05-06 DIAGNOSIS — J438 Other emphysema: Secondary | ICD-10-CM | POA: Diagnosis not present

## 2021-05-06 DIAGNOSIS — I700 Atherosclerosis of aorta: Secondary | ICD-10-CM | POA: Diagnosis present

## 2021-05-06 DIAGNOSIS — R0600 Dyspnea, unspecified: Secondary | ICD-10-CM

## 2021-05-06 DIAGNOSIS — J439 Emphysema, unspecified: Secondary | ICD-10-CM | POA: Diagnosis not present

## 2021-05-06 DIAGNOSIS — C3492 Malignant neoplasm of unspecified part of left bronchus or lung: Secondary | ICD-10-CM | POA: Diagnosis not present

## 2021-05-06 DIAGNOSIS — D696 Thrombocytopenia, unspecified: Secondary | ICD-10-CM | POA: Diagnosis not present

## 2021-05-06 DIAGNOSIS — R0609 Other forms of dyspnea: Secondary | ICD-10-CM | POA: Diagnosis present

## 2021-05-06 DIAGNOSIS — I25110 Atherosclerotic heart disease of native coronary artery with unstable angina pectoris: Principal | ICD-10-CM | POA: Diagnosis present

## 2021-05-06 DIAGNOSIS — J9602 Acute respiratory failure with hypercapnia: Secondary | ICD-10-CM | POA: Diagnosis not present

## 2021-05-06 DIAGNOSIS — I6522 Occlusion and stenosis of left carotid artery: Secondary | ICD-10-CM | POA: Diagnosis not present

## 2021-05-06 DIAGNOSIS — J90 Pleural effusion, not elsewhere classified: Secondary | ICD-10-CM

## 2021-05-06 DIAGNOSIS — Z951 Presence of aortocoronary bypass graft: Secondary | ICD-10-CM | POA: Diagnosis not present

## 2021-05-06 DIAGNOSIS — Z01818 Encounter for other preprocedural examination: Secondary | ICD-10-CM | POA: Diagnosis not present

## 2021-05-06 DIAGNOSIS — Z902 Acquired absence of lung [part of]: Secondary | ICD-10-CM

## 2021-05-06 DIAGNOSIS — I129 Hypertensive chronic kidney disease with stage 1 through stage 4 chronic kidney disease, or unspecified chronic kidney disease: Secondary | ICD-10-CM | POA: Diagnosis present

## 2021-05-06 DIAGNOSIS — I25118 Atherosclerotic heart disease of native coronary artery with other forms of angina pectoris: Secondary | ICD-10-CM | POA: Diagnosis not present

## 2021-05-06 DIAGNOSIS — E872 Acidosis, unspecified: Secondary | ICD-10-CM | POA: Diagnosis not present

## 2021-05-06 DIAGNOSIS — J811 Chronic pulmonary edema: Secondary | ICD-10-CM | POA: Diagnosis not present

## 2021-05-06 DIAGNOSIS — E7800 Pure hypercholesterolemia, unspecified: Secondary | ICD-10-CM | POA: Diagnosis not present

## 2021-05-06 DIAGNOSIS — I080 Rheumatic disorders of both mitral and aortic valves: Secondary | ICD-10-CM | POA: Diagnosis not present

## 2021-05-06 DIAGNOSIS — Z8249 Family history of ischemic heart disease and other diseases of the circulatory system: Secondary | ICD-10-CM | POA: Diagnosis not present

## 2021-05-06 DIAGNOSIS — Z87891 Personal history of nicotine dependence: Secondary | ICD-10-CM | POA: Diagnosis not present

## 2021-05-06 DIAGNOSIS — R079 Chest pain, unspecified: Secondary | ICD-10-CM | POA: Diagnosis not present

## 2021-05-06 DIAGNOSIS — R0602 Shortness of breath: Secondary | ICD-10-CM | POA: Diagnosis not present

## 2021-05-06 DIAGNOSIS — E7849 Other hyperlipidemia: Secondary | ICD-10-CM | POA: Diagnosis not present

## 2021-05-06 HISTORY — PX: LEFT HEART CATH AND CORONARY ANGIOGRAPHY: CATH118249

## 2021-05-06 LAB — CBC
HCT: 36.8 % — ABNORMAL LOW (ref 39.0–52.0)
Hemoglobin: 12.9 g/dL — ABNORMAL LOW (ref 13.0–17.0)
MCH: 32.3 pg (ref 26.0–34.0)
MCHC: 35.1 g/dL (ref 30.0–36.0)
MCV: 92.2 fL (ref 80.0–100.0)
Platelets: 191 10*3/uL (ref 150–400)
RBC: 3.99 MIL/uL — ABNORMAL LOW (ref 4.22–5.81)
RDW: 11.9 % (ref 11.5–15.5)
WBC: 6.6 10*3/uL (ref 4.0–10.5)
nRBC: 0 % (ref 0.0–0.2)

## 2021-05-06 LAB — CREATININE, SERUM
Creatinine, Ser: 1.37 mg/dL — ABNORMAL HIGH (ref 0.61–1.24)
GFR, Estimated: 53 mL/min — ABNORMAL LOW (ref >60)

## 2021-05-06 SURGERY — LEFT HEART CATH AND CORONARY ANGIOGRAPHY
Anesthesia: LOCAL

## 2021-05-06 MED ORDER — ATORVASTATIN CALCIUM 80 MG PO TABS
80.0000 mg | ORAL_TABLET | Freq: Every day | ORAL | Status: DC
  Administered 2021-05-07 – 2021-05-17 (×10): 80 mg via ORAL
  Filled 2021-05-06 (×10): qty 1

## 2021-05-06 MED ORDER — NITROGLYCERIN 0.4 MG SL SUBL: 0.4000 mg | SUBLINGUAL_TABLET | SUBLINGUAL | Status: DC | PRN

## 2021-05-06 MED ORDER — METOPROLOL TARTRATE 25 MG PO TABS
25.0000 mg | ORAL_TABLET | Freq: Two times a day (BID) | ORAL | Status: DC
  Administered 2021-05-06 – 2021-05-12 (×11): 25 mg via ORAL
  Filled 2021-05-06 (×12): qty 1

## 2021-05-06 MED ORDER — ASPIRIN EC 81 MG PO TBEC
81.0000 mg | DELAYED_RELEASE_TABLET | Freq: Every day | ORAL | Status: DC
  Administered 2021-05-07 – 2021-05-12 (×6): 81 mg via ORAL
  Filled 2021-05-06 (×6): qty 1

## 2021-05-06 MED ORDER — VERAPAMIL HCL 2.5 MG/ML IV SOLN
INTRAVENOUS | Status: AC
  Filled 2021-05-06: qty 2

## 2021-05-06 MED ORDER — ASPIRIN 81 MG PO CHEW: 81.0000 mg | CHEWABLE_TABLET | ORAL | Status: DC

## 2021-05-06 MED ORDER — HEPARIN SODIUM (PORCINE) 5000 UNIT/ML IJ SOLN
5000.0000 [IU] | Freq: Three times a day (TID) | INTRAMUSCULAR | Status: DC
  Administered 2021-05-06 – 2021-05-12 (×18): 5000 [IU] via SUBCUTANEOUS
  Filled 2021-05-06 (×18): qty 1

## 2021-05-06 MED ORDER — LIDOCAINE HCL (PF) 1 % IJ SOLN
INTRAMUSCULAR | Status: DC | PRN
  Administered 2021-05-06: 2 mL

## 2021-05-06 MED ORDER — LIDOCAINE HCL (PF) 1 % IJ SOLN
INTRAMUSCULAR | Status: AC
  Filled 2021-05-06: qty 30

## 2021-05-06 MED ORDER — IOHEXOL 350 MG/ML SOLN
INTRAVENOUS | Status: DC | PRN
  Administered 2021-05-06: 17:00:00 50 mL

## 2021-05-06 MED ORDER — HEPARIN SODIUM (PORCINE) 1000 UNIT/ML IJ SOLN
INTRAMUSCULAR | Status: DC | PRN
  Administered 2021-05-06: 6000 [IU] via INTRAVENOUS

## 2021-05-06 MED ORDER — SODIUM CHLORIDE 0.9% FLUSH: 3.0000 mL | INTRAVENOUS | Status: DC | PRN

## 2021-05-06 MED ORDER — SODIUM CHLORIDE 0.9% FLUSH
3.0000 mL | Freq: Two times a day (BID) | INTRAVENOUS | Status: DC
Start: 2021-05-06 — End: 2021-05-13
  Administered 2021-05-06 – 2021-05-12 (×4): 3 mL via INTRAVENOUS

## 2021-05-06 MED ORDER — FENTANYL CITRATE (PF) 100 MCG/2ML IJ SOLN
INTRAMUSCULAR | Status: AC
  Filled 2021-05-06: qty 2

## 2021-05-06 MED ORDER — SODIUM CHLORIDE 0.9 % WEIGHT BASED INFUSION: 1.0000 mL/kg/h | INTRAVENOUS | Status: AC

## 2021-05-06 MED ORDER — VERAPAMIL HCL 2.5 MG/ML IV SOLN
INTRAVENOUS | Status: DC | PRN
  Administered 2021-05-06: 17:00:00 5 mL via INTRA_ARTERIAL

## 2021-05-06 MED ORDER — ONDANSETRON HCL 4 MG/2ML IJ SOLN: 4.0000 mg | Freq: Four times a day (QID) | INTRAMUSCULAR | Status: DC | PRN

## 2021-05-06 MED ORDER — SODIUM CHLORIDE 0.9 % IV SOLN: 250.0000 mL | INTRAVENOUS | Status: DC | PRN

## 2021-05-06 MED ORDER — SODIUM CHLORIDE 0.9% FLUSH
3.0000 mL | Freq: Two times a day (BID) | INTRAVENOUS | Status: DC
  Administered 2021-05-07 – 2021-05-12 (×11): 3 mL via INTRAVENOUS

## 2021-05-06 MED ORDER — HEPARIN (PORCINE) IN NACL 1000-0.9 UT/500ML-% IV SOLN
INTRAVENOUS | Status: DC | PRN
  Administered 2021-05-06 (×2): 500 mL

## 2021-05-06 MED ORDER — FENTANYL CITRATE (PF) 100 MCG/2ML IJ SOLN
INTRAMUSCULAR | Status: DC | PRN
  Administered 2021-05-06: 25 ug via INTRAVENOUS

## 2021-05-06 MED ORDER — HEPARIN SODIUM (PORCINE) 1000 UNIT/ML IJ SOLN
INTRAMUSCULAR | Status: AC
  Filled 2021-05-06: qty 10

## 2021-05-06 MED ORDER — SODIUM CHLORIDE 0.9 % WEIGHT BASED INFUSION
3.0000 mL/kg/h | INTRAVENOUS | Status: DC
  Administered 2021-05-06: 12:00:00 3 mL/kg/h via INTRAVENOUS

## 2021-05-06 MED ORDER — ACETAMINOPHEN 325 MG PO TABS: 650.0000 mg | ORAL_TABLET | ORAL | Status: DC | PRN

## 2021-05-06 MED ORDER — ISOSORBIDE MONONITRATE ER 30 MG PO TB24
30.0000 mg | ORAL_TABLET | Freq: Every day | ORAL | Status: DC
  Administered 2021-05-07 – 2021-05-12 (×6): 30 mg via ORAL
  Filled 2021-05-06 (×6): qty 1

## 2021-05-06 MED ORDER — SODIUM CHLORIDE 0.9 % WEIGHT BASED INFUSION: 1.0000 mL/kg/h | INTRAVENOUS | Status: DC

## 2021-05-06 MED ORDER — MIDAZOLAM HCL 2 MG/2ML IJ SOLN
INTRAMUSCULAR | Status: DC | PRN
  Administered 2021-05-06: 1 mg via INTRAVENOUS

## 2021-05-06 MED ORDER — MIDAZOLAM HCL 2 MG/2ML IJ SOLN
INTRAMUSCULAR | Status: AC
  Filled 2021-05-06: qty 2

## 2021-05-06 MED ORDER — HEPARIN (PORCINE) IN NACL 1000-0.9 UT/500ML-% IV SOLN
INTRAVENOUS | Status: AC
  Filled 2021-05-06: qty 1000

## 2021-05-06 SURGICAL SUPPLY — 8 items
CATH OPTITORQUE TIG 4.0 5F (CATHETERS) ×1 IMPLANT
GLIDESHEATH SLEND A-KIT 6F 22G (SHEATH) ×1 IMPLANT
GUIDEWIRE INQWIRE 1.5J.035X260 (WIRE) IMPLANT
INQWIRE 1.5J .035X260CM (WIRE) ×2
KIT HEART LEFT (KITS) ×2 IMPLANT
PACK CARDIAC CATHETERIZATION (CUSTOM PROCEDURE TRAY) ×2 IMPLANT
TRANSDUCER W/STOPCOCK (MISCELLANEOUS) ×2 IMPLANT
TUBING CIL FLEX 10 FLL-RA (TUBING) ×2 IMPLANT

## 2021-05-06 NOTE — Interval H&P Note (Signed)
History and Physical Interval Note:  05/06/2021 4:55 PM  Kevin [name]  has presented today for surgery, with the diagnosis of UNSTABLE ANGINA, ABNORMAL EKG.  The various methods of treatment have been discussed with the patient and family. After consideration of risks, benefits and other options for treatment, the patient has consented to  Procedure(s): LEFT HEART CATH AND CORONARY ANGIOGRAPHY (N/A)  and possible PCI as a surgical intervention.  The patient's history has been reviewed, patient examined, no change in status, stable for surgery.  I have reviewed the patient's chart and labs.  Questions were answered to the patient's satisfaction.    Cath Lab Visit (complete for each Cath Lab visit)  Clinical Evaluation Leading to the Procedure:   ACS: Yes.    Non-ACS:    Anginal Classification: CCS IV  Anti-ischemic medical therapy: Minimal Therapy (1 class of medications)  Non-Invasive Test Results: No non-invasive testing performed  Prior CABG: No previous CABG   Kevin [name]

## 2021-05-06 NOTE — Progress Notes (Signed)
Received pt to room from Altha. Right radial site CDI with TR band in place. PPt with no c/o at this time. Bed alarm applied, call light in reach and family at bedside.

## 2021-05-06 NOTE — Plan of Care (Signed)

## 2021-05-06 NOTE — CV Procedure (Signed)
Left Heart Catheterization 05/06/21:            [name], MD, [facility] 05/06/2021, 5:26 PM Office: [phone] Fax: [phone] Pager: [phone]

## 2021-05-07 ENCOUNTER — Inpatient Hospital Stay (HOSPITAL_COMMUNITY): Payer: Medicare HMO

## 2021-05-07 ENCOUNTER — Encounter (HOSPITAL_COMMUNITY): Payer: Self-pay | Admitting: Cardiology

## 2021-05-07 DIAGNOSIS — I25110 Atherosclerotic heart disease of native coronary artery with unstable angina pectoris: Secondary | ICD-10-CM

## 2021-05-07 LAB — RESP PANEL BY RT-PCR (FLU A&B, COVID) ARPGX2
Influenza A by PCR: NEGATIVE
Influenza B by PCR: NEGATIVE
SARS Coronavirus 2 by RT PCR: NEGATIVE

## 2021-05-07 LAB — ECHOCARDIOGRAM COMPLETE
AR max vel: 2.84 cm2
AV Area VTI: 3.25 cm2
AV Area mean vel: 2.79 cm2
AV Mean grad: 3 mmHg
AV Peak grad: 5.2 mmHg
Ao pk vel: 1.14 m/s
Area-P 1/2: 3.5 cm2
Height: 68 in
S' Lateral: 4.07 cm
Weight: 2867.2 oz

## 2021-05-07 LAB — TSH: TSH: 3.714 u[IU]/mL (ref 0.350–4.500)

## 2021-05-07 MED ORDER — SODIUM CHLORIDE 0.9 % IV SOLN: INTRAVENOUS | Status: DC

## 2021-05-07 MED ORDER — IOHEXOL 300 MG/ML  SOLN
75.0000 mL | Freq: Once | INTRAMUSCULAR | Status: AC | PRN
  Administered 2021-05-07: 75 mL via INTRAVENOUS

## 2021-05-07 NOTE — TOC Progression Note (Signed)
Transition of Care [facility]) - Progression Note    Patient Details  Name: Kevin [name] MRN: [id number] Date of Birth: Jun 01, 1940  Transition of Care [facility]) CM/SW Contact  Zenon Mayo, RN Phone Number: 05/07/2021, 11:55 AM  Clinical Narrative:     Transition of Care [facility]) Screening Note   Patient Details  Name: Kevin [name] Date of Birth: 08/29/1940   Transition of Care [facility]) CM/SW Contact:    Zenon Mayo, RN Phone Number: 05/07/2021, 11:56 AM    Transition of Care Department [facility]) has reviewed patient and no TOC needs have been identified at this time. We will continue to monitor patient advancement through interdisciplinary progression rounds. If new patient transition needs arise, please place a TOC consult.          Expected Discharge Plan and Services                                                 Social Determinants of Health (SDOH) Interventions    Readmission Risk Interventions No flowsheet data found.

## 2021-05-07 NOTE — Consult Note (Signed)
[address]       [place] [address]             [id number]        [place] Medical Record #[id number] Date of Birth: 1940-07-13  Referring: No ref. provider found Primary Care: [name], MD Primary Cardiologist:None  Chief Complaint:   No chief complaint on file.   History of Present Illness:     81 yo male admitted after elective LHC with severe LM disease.  He has been symptomatic over the last several weeks with anginal symptoms with mild exertion.  He currently is chest pain free.    Past Medical History:  Diagnosis Date   Hyperlipidemia     Past Surgical History:  Procedure Laterality Date   epidural     back pain   IR CT SPINE LTD  06/18/2020   IR KYPHO LUMBAR INC FX REDUCE BONE BX UNI/BIL CANNULATION INC/IMAGING  06/18/2020   LEFT HEART CATH AND CORONARY ANGIOGRAPHY N/A 05/06/2021   Procedure: LEFT HEART CATH AND CORONARY ANGIOGRAPHY;  Surgeon: [name], MD;  Location: [place] [facility];  Service: Cardiovascular;  Laterality: N/A;   MINOR HEMORRHOIDECTOMY     SHOULDER SURGERY     TRANSURETHRAL RESECTION OF BLADDER TUMOR  2014   TRANSURETHRAL RESECTION OF BLADDER TUMOR N/A 05/19/2018   Procedure: TRANSURETHRAL RESECTION OF BLADDER TUMOR (TURBT) AND (INTRAVESICAL GEMCITABINE INSTILLED IN BLADDER IN PACU) ;  Surgeon: Franchot Gallo, MD;  Location: [facility];  Service: Urology;  Laterality: N/A;  30 MINS    Social History: Support: lives with wife  Social History   Tobacco Use  Smoking Status Former   Packs/day: 1.00   Years: 30.00   Pack years: 30.00   Types: Cigarettes   Quit date: 04/14/1995   Years since quitting: 26.0  Smokeless Tobacco Never    Social History   Substance and Sexual Activity  Alcohol Use Never     No Known Allergies    Current Facility-Administered Medications  Medication Dose Route Frequency Provider Last Rate Last Admin   0.9 %  sodium chloride infusion  250 mL Intravenous PRN  [name], MD       acetaminophen (TYLENOL) tablet 650 mg  650 mg Oral Q4H PRN [name], MD       aspirin EC tablet 81 mg  81 mg Oral Daily [name], MD       atorvastatin (LIPITOR) tablet 80 mg  80 mg Oral Daily [name], MD       heparin injection 5,000 Units  5,000 Units Subcutaneous Q8H [name], MD   5,000 Units at 05/07/21 0405   isosorbide mononitrate (IMDUR) 24 hr tablet 30 mg  30 mg Oral Daily [name], MD       metoprolol tartrate (LOPRESSOR) tablet 25 mg  25 mg Oral BID [name], MD   25 mg at 05/06/21 2037   nitroGLYCERIN (NITROSTAT) SL tablet 0.4 mg  0.4 mg Sublingual Q5 min PRN [name], MD       ondansetron [facility]) injection 4 mg  4 mg Intravenous Q6H PRN [name], MD       sodium chloride flush (NS) 0.9 % injection 3 mL  3 mL Intravenous Q12H [name], MD   3 mL at 05/06/21 2040   sodium chloride flush (NS) 0.9 % injection 3 mL  3 mL Intravenous Q12H [name], MD   3 mL at  05/07/21 0409   sodium chloride flush (NS) 0.9 % injection 3 mL  3 mL Intravenous PRN [name], MD        Medications Prior to Admission  Medication Sig Dispense Refill Last Dose   aspirin EC 81 MG tablet Take 81 mg by mouth daily.   05/06/2021 at 0700   atorvastatin (LIPITOR) 80 MG tablet Take 1 tablet (80 mg total) by mouth daily. 90 tablet 3 05/06/2021 at 0700   isosorbide mononitrate (IMDUR) 60 MG 24 hr tablet Take 0.5 tablets (30 mg total) by mouth daily. 15 tablet 2 05/06/2021 at 0700   metoprolol tartrate (LOPRESSOR) 25 MG tablet Take 1 tablet (25 mg total) by mouth 2 (two) times daily. 60 tablet 2 05/06/2021 at 0700   nitroGLYCERIN (NITROSTAT) 0.4 MG SL tablet Place 0.4 mg under the tongue every 5 (five) minutes as needed for chest pain.       Family History  Problem Relation Age of Onset   Alzheimer's disease Mother 19   Congestive Heart Failure Father 22   Heart disease Brother 69   Heart failure Brother 2   Heart failure Brother 60     Review of Systems:   Review of  Systems  Constitutional:  Negative for malaise/fatigue.  Respiratory:  Positive for shortness of breath.   Cardiovascular:  Positive for chest pain. Negative for palpitations and leg swelling.     Physical Exam: BP 137/71 (BP Location: Left Arm)    Pulse 68    Temp 97.6 F (36.4 C) (Oral)    Resp 16    Ht 5\' 8"  (1.727 m)    Wt 81.3 kg Comment: scale c   SpO2 98%    BMI 27.25 kg/m  Physical Exam Constitutional:      General: He is not in acute distress.    Appearance: Normal appearance. He is normal weight. He is not ill-appearing.  Cardiovascular:     Rate and Rhythm: Normal rate.  Pulmonary:     Effort: Pulmonary effort is normal. No respiratory distress.  Abdominal:     General: Abdomen is flat. There is no distension.  Musculoskeletal:     Cervical back: Normal range of motion.  Skin:    General: Skin is warm and dry.  Neurological:     General: No focal deficit present.     Mental Status: He is alert and oriented to person, place, and time.      Diagnostic Studies & Laboratory data:    Left Heart Catherization: LM CAD.  Good distal targets.  Non-dominant right Echo: Unable to view EKG: sinue I have independently reviewed the above radiologic studies and discussed with the patient   Recent Lab Findings: Lab Results  Component Value Date   WBC 6.6 05/06/2021   HGB 12.9 (L) 05/06/2021   HCT 36.8 (L) 05/06/2021   PLT 191 05/06/2021   GLUCOSE 90 05/01/2021   LDLDIRECT 80 05/01/2021   NA 139 05/01/2021   K 4.6 05/01/2021   CL 102 05/01/2021   CREATININE 1.37 (H) 05/06/2021   BUN 20 05/01/2021   CO2 23 05/01/2021   INR 1.0 06/18/2020      Assessment / Plan:   81yo male with LM CAD.  Will need to view the echo to assess for valvular disease.  Will plan for 2V CAD.  Will adjust schedule to accomodate.     I  spent 40 minutes counseling the patient face to face.   Lajuana Matte 05/07/2021 7:41  AM

## 2021-05-07 NOTE — Progress Notes (Signed)
Subjective:  Has not had any chest pain.  No complication from cardiac catheterization yesterday.  Intake/Output from previous day:  I/O last 3 completed shifts: In: 434.4 [P.O.:240; I.V.:194.4] Out: 2400 [Urine:2400] No intake/output data recorded.  Blood pressure 137/71, pulse 68, temperature 97.6 F (36.4 C), temperature source Oral, resp. rate 16, height 5\' 8"  (1.727 m), weight 81.3 kg, SpO2 98 %. Physical Exam Constitutional:      Appearance: Normal appearance.  HENT:     Mouth/Throat:     Mouth: Mucous membranes are moist.  Eyes:     Extraocular Movements: Extraocular movements intact.  Neck:     Vascular: No carotid bruit or JVD.  Cardiovascular:     Rate and Rhythm: Normal rate and regular rhythm.     Pulses: Intact distal pulses.     Heart sounds: Normal heart sounds. No murmur heard.   No gallop.  Pulmonary:     Effort: Pulmonary effort is normal.     Breath sounds: Normal breath sounds.  Abdominal:     General: Bowel sounds are normal.     Palpations: Abdomen is soft.  Musculoskeletal:        General: No swelling.  Skin:    Capillary Refill: Capillary refill takes less than 2 seconds.  Neurological:     General: No focal deficit present.     Mental Status: He is alert and oriented to person, place, and time.  Psychiatric:        Mood and Affect: Mood normal.    Lab Results: BMP BNP (last 3 results) No results for input(s): BNP in the last 8760 hours.  ProBNP (last 3 results) No results for input(s): PROBNP in the last 8760 hours. BMP Latest Ref Rng & Units 05/06/2021 05/01/2021 06/18/2020  Glucose 70 - 99 mg/dL - 90 98  BUN 8 - 27 mg/dL - 20 13  Creatinine 0.61 - 1.24 mg/dL 1.37(H) 1.39(H) 1.36(H)  BUN/Creat Ratio 10 - 24 - 14 -  Sodium 134 - 144 mmol/L - 139 133(L)  Potassium 3.5 - 5.2 mmol/L - 4.6 4.3  Chloride 96 - 106 mmol/L - 102 103  CO2 20 - 29 mmol/L - 23 21(L)  Calcium 8.6 - 10.2 mg/dL - 9.7 9.0   No flowsheet data found. CBC Latest Ref  Rng & Units 05/06/2021 05/01/2021 06/18/2020  WBC 4.0 - 10.5 K/uL 6.6 7.3 6.3  Hemoglobin 13.0 - 17.0 g/dL 12.9(L) 14.5 13.8  Hematocrit 39.0 - 52.0 % 36.8(L) 41.2 39.7  Platelets 150 - 400 K/uL 191 236 231   Lipid Panel     Component Value Date/Time   LDLDIRECT 80 05/01/2021 1146   Cardiac Panel (last 3 results) No results for input(s): CKTOTAL, CKMB, TROPONINI, RELINDX in the last 72 hours.  HEMOGLOBIN A1C No results found for: HGBA1C, MPG TSH No results for input(s): TSH in the last 8760 hours. Imaging: CARDIAC CATHETERIZATION  Result Date: 05/06/2021 Left Heart Catheterization 05/06/21: LV: 126/4, EDP 11 mmHg.  Ao 123/46, mean 33 mmHg.  No pressure gradient across aortic valve. LVEF: 40 to 45% with global hypokinesis.  LV is mildly dilated. LM: High-grade, complex, calcified 95% distal left main stenosis. LAD: Proximal segment mild to moderately calcified.  Proximal to mid 60% calcific stenosis.  Gives origin to 3 large diagonals and a fourth large diagonal from the mid to distal segment, mild disease.  No significant calcification mid to distal LAD. CX: Gives origin to high OM1 which is got mild disease, after that there is  a 60% calcific focal stenosis.  Mid to distal circumflex is large, dominant with very mild noncalcific disease. RCA: Nondominant, normal. Recommendation: Patient will need to be admitted to the hospital in view of unstable anginal symptoms and a critical left main disease.  I have already contacted Dr. Melodie Bouillon to consider for CABG.  50 mL contrast utilized.    Cardiac Studies:  Left Heart Catheterization 05/06/21:  LV: 126/4, EDP 11 mmHg.  Ao 123/46, mean 33 mmHg.  No pressure gradient across aortic valve. LVEF: 40 to 45% with global hypokinesis.  LV is mildly dilated. LM: High-grade, complex, calcified 95% distal left main stenosis. LAD: Proximal segment mild to moderately calcified.  Proximal to mid 60% calcific stenosis.  Gives origin to 3 large  diagonals and a fourth large diagonal from the mid to distal segment, mild disease.  No significant calcification mid to distal LAD. CX: Gives origin to high OM1 which is got mild disease, after that there is a 60% calcific focal stenosis.  Mid to distal circumflex is large, dominant with very mild noncalcific disease. RCA: Nondominant, normal.       EKG:   EKG 05/01/2021: Normal sinus rhythm at rate of 64 bpm, normal axis, nonspecific T abnormality.  PACs (2).  Compared to 04/09/2021, inferior and lateral ST segment depression suggestive of ischemia no longer present.   No results found for this or any previous visit (from the past 43800 hour(s)).  Scheduled Meds:  aspirin EC  81 mg Oral Daily   atorvastatin  80 mg Oral Daily   heparin  5,000 Units Subcutaneous Q8H   isosorbide mononitrate  30 mg Oral Daily   metoprolol tartrate  25 mg Oral BID   sodium chloride flush  3 mL Intravenous Q12H   sodium chloride flush  3 mL Intravenous Q12H   Continuous Infusions:  sodium chloride     PRN Meds:.sodium chloride, acetaminophen, nitroGLYCERIN, ondansetron (ZOFRAN) IV, sodium chloride flush  Assessment/Plan:  1.  Unstable angina pectoris with critical left main disease 2.  Primary hypertension 3.  Hypercholesterolemia  Recommendation: We will obtain chest x-ray PA and lateral view as a baseline for his mild dyspnea and also underlying coronary artery disease prior to his CABG.  We will also repeat echocardiogram as the echocardiogram performed in PCP office probably suboptimal and also EF was deemed to be normal however by angiogram, EF appears to be mild to moderately depressed.  Fortunately at bedrest and minimal activity, he has not had any further chest pain, is presently on subcu heparin only.  Continue present medications, awaiting CABG evaluation.   [name], M.D. 05/07/2021, 7:43 AM [facility] Pager: [phone] Office: [phone] If no answer:  [phone]

## 2021-05-07 NOTE — Plan of Care (Signed)
°  Problem: Clinical Measurements: Goal: Will remain free from infection Outcome: Progressing Goal: Diagnostic test results will improve Outcome: Progressing Goal: Respiratory complications will improve Outcome: Progressing Goal: Cardiovascular complication will be avoided Outcome: Progressing   Problem: Coping: Goal: Level of anxiety will decrease Outcome: Progressing   Problem: Elimination: Goal: Will not experience complications related to urinary retention Outcome: Progressing   Problem: Pain Managment: Goal: General experience of comfort will improve Outcome: Progressing   Problem: Safety: Goal: Ability to remain free from injury will improve Outcome: Progressing

## 2021-05-07 NOTE — Progress Notes (Signed)
°  Echocardiogram 2D Echocardiogram has been performed.  Kevin [name] 05/07/2021, 9:56 AM

## 2021-05-08 ENCOUNTER — Other Ambulatory Visit (HOSPITAL_COMMUNITY): Payer: Medicare HMO

## 2021-05-08 NOTE — Progress Notes (Signed)
Pre-operative CABG education completed.  Patient able to is IS correctly @ 1264ml.  Able to demonstrate getting OOB and out of the chair keeping his arms in the tube.  Answered questions from patient and wife. [phone]

## 2021-05-08 NOTE — Plan of Care (Signed)
°  Problem: Education: Goal: Knowledge of General Education information will improve Description: Including pain rating scale, medication(s)/side effects and non-pharmacologic comfort measures Outcome: Progressing   Problem: Clinical Measurements: Goal: Will remain free from infection Outcome: Progressing Goal: Respiratory complications will improve Outcome: Progressing Goal: Cardiovascular complication will be avoided Outcome: Progressing   Problem: Activity: Goal: Risk for activity intolerance will decrease Outcome: Progressing   Problem: Nutrition: Goal: Adequate nutrition will be maintained Outcome: Progressing   Problem: Coping: Goal: Level of anxiety will decrease Outcome: Progressing   Problem: Elimination: Goal: Will not experience complications related to urinary retention Outcome: Progressing   Problem: Pain Managment: Goal: General experience of comfort will improve Outcome: Progressing   Problem: Safety: Goal: Ability to remain free from injury will improve Outcome: Progressing

## 2021-05-08 NOTE — Progress Notes (Signed)
Subjective:  Has not had any chest pain.  No complication from cardiac catheterization yesterday.  Intake/Output from previous day:  I/O last 3 completed shifts: In: 3997.4 [P.O.:1800; I.V.:2197.4] Out: 4000 [Urine:4000] No intake/output data recorded.  Blood pressure (!) 146/81, pulse 62, temperature 97.9 F (36.6 C), temperature source Oral, resp. rate 14, height 5\' 8"  (1.727 m), weight 81.4 kg, SpO2 96 %. Physical Exam Constitutional:      Appearance: Normal appearance.  HENT:     Mouth/Throat:     Mouth: Mucous membranes are moist.  Eyes:     Extraocular Movements: Extraocular movements intact.  Neck:     Vascular: No carotid bruit or JVD.  Cardiovascular:     Rate and Rhythm: Normal rate and regular rhythm.     Pulses: Intact distal pulses.     Heart sounds: Normal heart sounds. No murmur heard.   No gallop.  Pulmonary:     Effort: Pulmonary effort is normal.     Breath sounds: Normal breath sounds.  Abdominal:     General: Bowel sounds are normal.     Palpations: Abdomen is soft.  Musculoskeletal:        General: No swelling.  Skin:    Capillary Refill: Capillary refill takes less than 2 seconds.  Neurological:     General: No focal deficit present.     Mental Status: He is alert and oriented to person, place, and time.  Psychiatric:        Mood and Affect: Mood normal.    Lab Results: BMP BNP (last 3 results) No results for input(s): BNP in the last 8760 hours.  ProBNP (last 3 results) No results for input(s): PROBNP in the last 8760 hours. BMP Latest Ref Rng & Units 05/06/2021 05/01/2021 06/18/2020  Glucose 70 - 99 mg/dL - 90 98  BUN 8 - 27 mg/dL - 20 13  Creatinine 0.61 - 1.24 mg/dL 1.37(H) 1.39(H) 1.36(H)  BUN/Creat Ratio 10 - 24 - 14 -  Sodium 134 - 144 mmol/L - 139 133(L)  Potassium 3.5 - 5.2 mmol/L - 4.6 4.3  Chloride 96 - 106 mmol/L - 102 103  CO2 20 - 29 mmol/L - 23 21(L)  Calcium 8.6 - 10.2 mg/dL - 9.7 9.0   No flowsheet data found. CBC  Latest Ref Rng & Units 05/06/2021 05/01/2021 06/18/2020  WBC 4.0 - 10.5 K/uL 6.6 7.3 6.3  Hemoglobin 13.0 - 17.0 g/dL 12.9(L) 14.5 13.8  Hematocrit 39.0 - 52.0 % 36.8(L) 41.2 39.7  Platelets 150 - 400 K/uL 191 236 231   Lipid Panel     Component Value Date/Time   LDLDIRECT 80 05/01/2021 1146   Cardiac Panel (last 3 results) No results for input(s): CKTOTAL, CKMB, TROPONINI, RELINDX in the last 72 hours.  HEMOGLOBIN A1C No results found for: HGBA1C, MPG TSH Recent Labs    05/07/21 0915  TSH 3.714   Imaging:  CT angiogram chest 05/07/2021: 1. Right upper lobe spiculated lesions consistent with malignancy. Multidisciplinary consult is advised.  2. No evidence of metastatic disease.  3. Aortic Atherosclerosis (ICD10-I70.0) and Emphysema (ICD10-J43.9).   CXR 05/07/2021: 1. 4.5 cm opacity posteriorly and superiorly in the right lung. While this may represent atelectasis, infection or malignancy is not excluded. Recommend CT chest with contrast for further evaluation. 2. Aortic atherosclerosis   Cardiac Studies:  Left Heart Catheterization 05/06/21:  LV: 126/4, EDP 11 mmHg.  Ao 123/46, mean 33 mmHg.  No pressure gradient across aortic valve. LVEF: 40 to 45% with  global hypokinesis.  LV is mildly dilated. LM: High-grade, complex, calcified 95% distal left main stenosis. LAD: Proximal segment mild to moderately calcified.  Proximal to mid 60% calcific stenosis.  Gives origin to 3 large diagonals and a fourth large diagonal from the mid to distal segment, mild disease.  No significant calcification mid to distal LAD. CX: Gives origin to high OM1 which is got mild disease, after that there is a 60% calcific focal stenosis.  Mid to distal circumflex is large, dominant with very mild noncalcific disease. RCA: Nondominant, normal.   LM calcific 95% stenosis and Dominant Cx mid 60% after OM-1  Proximal to mid LAD calcific 60% stenosis after D-1  Echocardiogram 05/07/2021:   1. Left  ventricular ejection fraction, by estimation, is 40 to 45%. The left ventricle has mildly decreased function. The left ventricle  demonstrates global hypokinesis. Left ventricular diastolic parameters were normal.   2. Right ventricular systolic function is normal. The right ventricular size is normal.   3. The mitral valve is normal in structure. Trivial mitral valve regurgitation. No evidence of mitral stenosis.   4. The aortic valve is normal in structure. Aortic valve regurgitation is not visualized. No aortic stenosis is present.    EKG:   EKG 05/01/2021: Normal sinus rhythm at rate of 64 bpm, normal axis, nonspecific T abnormality.  PACs (2).  Compared to 04/09/2021, inferior and lateral ST segment depression suggestive of ischemia no longer present.  Scheduled Meds:  aspirin EC  81 mg Oral Daily   atorvastatin  80 mg Oral Daily   heparin  5,000 Units Subcutaneous Q8H   isosorbide mononitrate  30 mg Oral Daily   metoprolol tartrate  25 mg Oral BID   sodium chloride flush  3 mL Intravenous Q12H   sodium chloride flush  3 mL Intravenous Q12H   Continuous Infusions:  sodium chloride     sodium chloride Stopped (05/08/21 0534)   PRN Meds:.sodium chloride, acetaminophen, nitroGLYCERIN, ondansetron (ZOFRAN) IV, sodium chloride flush  Assessment/Plan:  1.  Unstable angina pectoris with critical left main disease 2.  Primary hypertension 3.  Hypercholesterolemia 4. Primary lung cancer  Recommendation:   I have placed urgent consultation with oncology regarding management of the tumor.  Patient from cardiac standpoint fortunately has remained stable without recurrence of angina pectoris, presently on subcu heparin for DVT prophylaxis and aspirin along with low-dose beta-blocker and Imdur.  Will increase metoprolol tartrate to 50 mg twice daily as his blood pressure is up.  Continue high intensity, high-dose statin.  Will request hospitalist service to manage the patient while he  is in-house as he is remained stable from cardiac standpoint and I can continue to follow him on the sidelines until CABG which is planned for next week is performed.   [name], MD, [facility] 05/08/2021, 7:11 AM Office: [phone] Fax: [phone] Pager: [phone]

## 2021-05-08 NOTE — TOC Progression Note (Signed)
Transition of Care [facility]) - Progression Note    Patient Details  Name: Kevin [name] MRN: [id number] Date of Birth: 1940-11-07  Transition of Care [facility]) CM/SW Contact  Zenon Mayo, RN Phone Number: 05/08/2021, 3:42 PM  Clinical Narrative:    from home with wife, dyspnea, cxr shows opacity, (malignant area), oncology consulted. For CABG on 1/31 and needle bx.  TOC will continue to follow for dc needs.         Expected Discharge Plan and Services                                                 Social Determinants of Health (SDOH) Interventions    Readmission Risk Interventions No flowsheet data found.

## 2021-05-08 NOTE — Progress Notes (Signed)
°   °  [address]       Kevin [name],[name] [address]             [phone]       CT chest reviewed RUL pulmonary mass and satellite lesion concerning to primary lung cancer.  There is also significant emphysematous changes throughout both lung fields.  Will plan for CABG 2 on 1/31, and needle biopsy of the RUL mass.  [name]

## 2021-05-09 ENCOUNTER — Inpatient Hospital Stay (HOSPITAL_COMMUNITY): Payer: Medicare HMO

## 2021-05-09 DIAGNOSIS — E7849 Other hyperlipidemia: Secondary | ICD-10-CM | POA: Diagnosis not present

## 2021-05-09 NOTE — Care Management Important Message (Signed)
Important Message  Patient Details  Name: Jayant Kriz MRN: [id number] Date of Birth: 1940/11/05   Medicare Important Message Given:  Yes     Shelda Altes 05/09/2021, 10:07 AM

## 2021-05-09 NOTE — Progress Notes (Signed)
Pre-CABG study completed.   Please see CV Proc for preliminary results.   Darlin Coco, RDMS, RVT

## 2021-05-09 NOTE — Progress Notes (Signed)
Subjective:  Patient seen and examined at approximately 8:15 AM. Patient resting comfortably in bed.  Denies chest pain, shortness of breath. Has ambulated in the room without issue   Intake/Output from previous day:  I/O last 3 completed shifts: In: 2751.3 [P.O.:1062; I.V.:1689.3] Out: 3100 [Urine:3100] Total I/O In: 240 [P.O.:240] Out: 350 [Urine:350]  Blood pressure 131/66, pulse 64, temperature 97.9 F (36.6 C), temperature source Oral, resp. rate 17, height 5\' 8"  (1.727 m), weight 81.1 kg, SpO2 98 %. Physical Exam Vitals reviewed.  Constitutional:      Appearance: Normal appearance.  HENT:     Mouth/Throat:     Mouth: Mucous membranes are moist.  Neck:     Vascular: No carotid bruit or JVD.  Cardiovascular:     Rate and Rhythm: Normal rate and regular rhythm.     Pulses: Intact distal pulses.     Heart sounds: Normal heart sounds. No murmur heard.   No gallop.  Pulmonary:     Effort: Pulmonary effort is normal.     Breath sounds: Normal breath sounds.  Abdominal:     General: Bowel sounds are normal.     Palpations: Abdomen is soft.  Musculoskeletal:     Right lower leg: No edema.     Left lower leg: No edema.  Skin:    Capillary Refill: Capillary refill takes less than 2 seconds.  Neurological:     General: No focal deficit present.     Mental Status: He is alert and oriented to person, place, and time.  Psychiatric:        Mood and Affect: Mood normal.    Lab Results: BMP BNP (last 3 results) No results for input(s): BNP in the last 8760 hours.  ProBNP (last 3 results) No results for input(s): PROBNP in the last 8760 hours. BMP Latest Ref Rng & Units 05/06/2021 05/01/2021 06/18/2020  Glucose 70 - 99 mg/dL - 90 98  BUN 8 - 27 mg/dL - 20 13  Creatinine 0.61 - 1.24 mg/dL 1.37(H) 1.39(H) 1.36(H)  BUN/Creat Ratio 10 - 24 - 14 -  Sodium 134 - 144 mmol/L - 139 133(L)  Potassium 3.5 - 5.2 mmol/L - 4.6 4.3  Chloride 96 - 106 mmol/L - 102 103  CO2 20 - 29 mmol/L  - 23 21(L)  Calcium 8.6 - 10.2 mg/dL - 9.7 9.0   No flowsheet data found. CBC Latest Ref Rng & Units 05/06/2021 05/01/2021 06/18/2020  WBC 4.0 - 10.5 K/uL 6.6 7.3 6.3  Hemoglobin 13.0 - 17.0 g/dL 12.9(L) 14.5 13.8  Hematocrit 39.0 - 52.0 % 36.8(L) 41.2 39.7  Platelets 150 - 400 K/uL 191 236 231   Lipid Panel     Component Value Date/Time   LDLDIRECT 80 05/01/2021 1146   Cardiac Panel (last 3 results) No results for input(s): CKTOTAL, CKMB, TROPONINI, RELINDX in the last 72 hours.  HEMOGLOBIN A1C No results found for: HGBA1C, MPG TSH Recent Labs    05/07/21 0915  TSH 3.714   Imaging:  CT CHEST W CONTRAST 05/07/2021 1. Right upper lobe spiculated lesions consistent with malignancy. Multidisciplinary consult is advised.  2. No evidence of metastatic disease.  3. Aortic Atherosclerosis (ICD10-I70.0) and Emphysema (ICD10-J43.9).  Chest x-ray 05/07/2021:  1. 4.5 cm opacity posteriorly and superiorly in the right lung. While this may represent atelectasis, infection or malignancy is not excluded. Recommend CT chest with contrast for further evaluation. 2. Aortic atherosclerosis  Cardiac Studies: Echocardiogram 05/07/2021:   1. Left ventricular ejection fraction, by  estimation, is 40 to 45%. The  left ventricle has mildly decreased function. The left ventricle  demonstrates global hypokinesis. Left ventricular diastolic parameters  were normal.   2. Right ventricular systolic function is normal. The right ventricular  size is normal.   3. The mitral valve is normal in structure. Trivial mitral valve  regurgitation. No evidence of mitral stenosis.   4. The aortic valve is normal in structure. Aortic valve regurgitation is  not visualized. No aortic stenosis is present.  Left Heart Catheterization 05/06/21:  LV: 126/4, EDP 11 mmHg.  Ao 123/46, mean 33 mmHg.  No pressure gradient across aortic valve. LVEF: 40 to 45% with global hypokinesis.  LV is mildly dilated. LM: High-grade,  complex, calcified 95% distal left main stenosis. LAD: Proximal segment mild to moderately calcified.  Proximal to mid 60% calcific stenosis.  Gives origin to 3 large diagonals and a fourth large diagonal from the mid to distal segment, mild disease.  No significant calcification mid to distal LAD. CX: Gives origin to high OM1 which is got mild disease, after that there is a 60% calcific focal stenosis.  Mid to distal circumflex is large, dominant with very mild noncalcific disease. RCA: Nondominant, normal.       EKG:   05/01/2021: Normal sinus rhythm at rate of 64 bpm, normal axis, nonspecific T abnormality.  PACs (2).  Compared to 04/09/2021, inferior and lateral ST segment depression suggestive of ischemia no longer present.   Scheduled Meds:  aspirin EC  81 mg Oral Daily   atorvastatin  80 mg Oral Daily   heparin  5,000 Units Subcutaneous Q8H   isosorbide mononitrate  30 mg Oral Daily   metoprolol tartrate  25 mg Oral BID   sodium chloride flush  3 mL Intravenous Q12H   sodium chloride flush  3 mL Intravenous Q12H   Continuous Infusions:  sodium chloride     sodium chloride Stopped (05/08/21 0534)   PRN Meds:.sodium chloride, acetaminophen, nitroGLYCERIN, ondansetron (ZOFRAN) IV, sodium chloride flush  Assessment/Plan:  1.  Unstable angina pectoris with critical left main disease 2.  Primary hypertension 3.  Hypercholesterolemia 4.  Right lung upper lobe lesion - consistent with malignancy   Recommendation:  Patient has remained stable form a cardiac standpoint without recurrence of angina or dyspnea.  Repeat echocardiogram during admission revealed LVEF 40-45%, global hypokinesis, no significant valvular abnormality  Continue aspirin, atorvastatin, Imdur, Lopressor, and PRN nitroglycerin  Continue subcutaneous heparin for DVT prophylaxis  In regard to right lung lesions, oncology consult is pending   Patient is awaiting CABG which is planned for next week.   Patient  was seen in collaboration with Dr. Einar Gip. He also reviewed patient's chart and examined the patient. Dr. Einar Gip is in agreement of the plan.    Alethia Berthold, PA-C 05/09/2021, 9:59 AM Office: [phone]

## 2021-05-09 NOTE — Progress Notes (Signed)
°   °  [address]       [place] [address]             [phone]       No events. Doing well. Discussed results with patient's wife. No significant carotid disease on ultrasound. For CABG with right lung biopsy this coming Tuesday  Kevin [name]

## 2021-05-10 LAB — SURGICAL PCR SCREEN
MRSA, PCR: NEGATIVE
Staphylococcus aureus: NEGATIVE

## 2021-05-10 NOTE — Plan of Care (Signed)
  Problem: Education: Goal: Knowledge of General Education information will improve Description: Including pain rating scale, medication(s)/side effects and non-pharmacologic comfort measures Outcome: Progressing   Problem: Clinical Measurements: Goal: Ability to maintain clinical measurements within normal limits will improve Outcome: Progressing Goal: Diagnostic test results will improve Outcome: Progressing   

## 2021-05-10 NOTE — Progress Notes (Signed)
Obtained surgical PCR for CABG pre-op. Surgical PCR sent to lab.

## 2021-05-11 ENCOUNTER — Encounter (HOSPITAL_COMMUNITY): Payer: Self-pay | Admitting: Cardiology

## 2021-05-11 LAB — COMPREHENSIVE METABOLIC PANEL
ALT: 16 U/L (ref 0–44)
AST: 15 U/L (ref 15–41)
Albumin: 3.6 g/dL (ref 3.5–5.0)
Alkaline Phosphatase: 63 U/L (ref 38–126)
Anion gap: 6 (ref 5–15)
BUN: 15 mg/dL (ref 8–23)
CO2: 26 mmol/L (ref 22–32)
Calcium: 8.8 mg/dL — ABNORMAL LOW (ref 8.9–10.3)
Chloride: 104 mmol/L (ref 98–111)
Creatinine, Ser: 1.57 mg/dL — ABNORMAL HIGH (ref 0.61–1.24)
GFR, Estimated: 45 mL/min — ABNORMAL LOW (ref >60)
Glucose, Bld: 109 mg/dL — ABNORMAL HIGH (ref 70–99)
Potassium: 4.4 mmol/L (ref 3.5–5.1)
Sodium: 136 mmol/L (ref 135–145)
Total Bilirubin: 1.1 mg/dL (ref 0.3–1.2)
Total Protein: 6.5 g/dL (ref 6.5–8.1)

## 2021-05-11 LAB — CBC
HCT: 38.7 % — ABNORMAL LOW (ref 39.0–52.0)
Hemoglobin: 13.4 g/dL (ref 13.0–17.0)
MCH: 32.1 pg (ref 26.0–34.0)
MCHC: 34.6 g/dL (ref 30.0–36.0)
MCV: 92.8 fL (ref 80.0–100.0)
Platelets: 204 10*3/uL (ref 150–400)
RBC: 4.17 MIL/uL — ABNORMAL LOW (ref 4.22–5.81)
RDW: 12.3 % (ref 11.5–15.5)
WBC: 4.9 10*3/uL (ref 4.0–10.5)
nRBC: 0 % (ref 0.0–0.2)

## 2021-05-11 NOTE — Plan of Care (Signed)
°  Problem: Education: Goal: Knowledge of General Education information will improve Description: Including pain rating scale, medication(s)/side effects and non-pharmacologic comfort measures Outcome: Progressing   Problem: Clinical Measurements: Goal: Ability to maintain clinical measurements within normal limits will improve Outcome: Progressing   Problem: Activity: Goal: Risk for activity intolerance will decrease Outcome: Progressing   Problem: Clinical Measurements: Goal: Cardiovascular complication will be avoided Outcome: Progressing

## 2021-05-11 NOTE — Progress Notes (Addendum)
Subjective:  Patient seen and examined at bedside at approximately 10:45 AM.  Patient resting comfortably in bed.  He has had no recurrence of angina. Denies shortness of breath, orthopnea, PND.  Preoperative evaluation did reveal left CIA stenosis 40-59% and the left subclavian artery reports stenosis.   Intake/Output from previous day:  I/O last 3 completed shifts: In: 580 [P.O.:580] Out: 1250 [Urine:1250] Total I/O In: 240 [P.O.:240] Out: -   Blood pressure 131/62, pulse 66, temperature 97.6 F (36.4 C), resp. rate 18, height 5\' 8"  (1.727 m), weight 81.2 kg, SpO2 96 %. Physical Exam Vitals reviewed.  Constitutional:      Appearance: Normal appearance.  HENT:     Mouth/Throat:     Mouth: Mucous membranes are moist.  Neck:     Vascular: No carotid bruit or JVD.  Cardiovascular:     Rate and Rhythm: Normal rate and regular rhythm.     Pulses: Intact distal pulses.     Heart sounds: Normal heart sounds. No murmur heard.   No gallop.  Pulmonary:     Effort: Pulmonary effort is normal.     Breath sounds: Normal breath sounds.  Abdominal:     General: Bowel sounds are normal.     Palpations: Abdomen is soft.  Musculoskeletal:     Right lower leg: No edema.     Left lower leg: No edema.  Skin:    Capillary Refill: Capillary refill takes less than 2 seconds.  Neurological:     General: No focal deficit present.     Mental Status: He is alert and oriented to person, place, and time.  Psychiatric:        Mood and Affect: Mood normal.    Lab Results: BMP BNP (last 3 results) No results for input(s): BNP in the last 8760 hours.  ProBNP (last 3 results) No results for input(s): PROBNP in the last 8760 hours. BMP Latest Ref Rng & Units 05/06/2021 05/01/2021 06/18/2020  Glucose 70 - 99 mg/dL - 90 98  BUN 8 - 27 mg/dL - 20 13  Creatinine 0.61 - 1.24 mg/dL 1.37(H) 1.39(H) 1.36(H)  BUN/Creat Ratio 10 - 24 - 14 -  Sodium 134 - 144 mmol/L - 139 133(L)  Potassium 3.5 - 5.2  mmol/L - 4.6 4.3  Chloride 96 - 106 mmol/L - 102 103  CO2 20 - 29 mmol/L - 23 21(L)  Calcium 8.6 - 10.2 mg/dL - 9.7 9.0   No flowsheet data found. CBC Latest Ref Rng & Units 05/06/2021 05/01/2021 06/18/2020  WBC 4.0 - 10.5 K/uL 6.6 7.3 6.3  Hemoglobin 13.0 - 17.0 g/dL 12.9(L) 14.5 13.8  Hematocrit 39.0 - 52.0 % 36.8(L) 41.2 39.7  Platelets 150 - 400 K/uL 191 236 231   Lipid Panel     Component Value Date/Time   LDLDIRECT 80 05/01/2021 1146   Cardiac Panel (last 3 results) No results for input(s): CKTOTAL, CKMB, TROPONINI, RELINDX in the last 72 hours.  HEMOGLOBIN A1C No results found for: HGBA1C, MPG TSH Recent Labs    05/07/21 0915  TSH 3.714    Imaging: VAS US DOPPLER PRE CABG  Result Date: 05/11/2021 PREOPERATIVE VASCULAR EVALUATION Patient Name:  Kevin [name]  Date of Exam:   05/09/2021 Medical Rec #: [id number]       Accession #:    [id number] Date of Birth: May 25, 1940       Patient Gender: M Patient Age:   81 years Exam Location:  [facility] Procedure:  VAS US DOPPLER PRE CABG Referring Phys: HARRELL LIGHTFOOT --------------------------------------------------------------------------------  Risk Factors: Hyperlipidemia, past history of smoking, coronary artery disease. Performing Technologist: Darlin Coco RDMS RVT  Examination Guidelines: A complete evaluation includes B-mode imaging, spectral Doppler, color Doppler, and power Doppler as needed of all accessible portions of each vessel. Bilateral testing is considered an integral part of a complete examination. Limited examinations for reoccurring indications may be performed as noted.  Right Carotid Findings: +----------+--------+--------+--------+------------------------+--------+             PSV cm/s EDV cm/s Stenosis Describe                 Comments  +----------+--------+--------+--------+------------------------+--------+  CCA Prox   106      9                                                     +----------+--------+--------+--------+------------------------+--------+  CCA Distal 121      12                diffuse and heterogenous           +----------+--------+--------+--------+------------------------+--------+  ICA Prox   92       15                heterogenous mild                  +----------+--------+--------+--------+------------------------+--------+  ICA Distal 61       15                                                   +----------+--------+--------+--------+------------------------+--------+  ECA        100                                                           +----------+--------+--------+--------+------------------------+--------+ +----------+--------+-------+---------+------------+             PSV cm/s EDV cms Describe  Arm Pressure  +----------+--------+-------+---------+------------+  Subclavian 214              Turbulent 120           +----------+--------+-------+---------+------------+ +---------+--------+--+--------+--+---------+  Vertebral PSV cm/s 25 EDV cm/s 10 Antegrade  +---------+--------+--+--------+--+---------+ Left Carotid Findings: +----------+--------+--------+--------+-------------------------+---------+             PSV cm/s EDV cm/s Stenosis Describe                  Comments   +----------+--------+--------+--------+-------------------------+---------+  CCA Prox   118      14                                                     +----------+--------+--------+--------+-------------------------+---------+  CCA Distal 118      9                 calcific and focal                   +----------+--------+--------+--------+-------------------------+---------+  ICA Prox   214      24       40-59%   calcific and heterogenous            +----------+--------+--------+--------+-------------------------+---------+  ICA Mid    153      23                                          turbulent  +----------+--------+--------+--------+-------------------------+---------+  ICA Distal 68        14                                                     +----------+--------+--------+--------+-------------------------+---------+  ECA        167                                                             +----------+--------+--------+--------+-------------------------+---------+  +----------+--------+--------+--------+------------+  Subclavian PSV cm/s EDV cm/s Describe Arm Pressure  +----------+--------+--------+--------+------------+             432               Stenotic 103           +----------+--------+--------+--------+------------+ +---------+--------+--+--------+--+---------+  Vertebral PSV cm/s 43 EDV cm/s 10 Antegrade  +---------+--------+--+--------+--+---------+  ABI Findings: +--------+------------------+-----+---------+--------+  Right    Rt Pressure (mmHg) Index Waveform  Comment   +--------+------------------+-----+---------+--------+  Brachial 120                      triphasic           +--------+------------------+-----+---------+--------+  PTA      148                1.23  triphasic           +--------+------------------+-----+---------+--------+  DP       137                1.14  triphasic           +--------+------------------+-----+---------+--------+ +--------+------------------+-----+---------+-------+  Left     Lt Pressure (mmHg) Index Waveform  Comment  +--------+------------------+-----+---------+-------+  Brachial 103                      triphasic          +--------+------------------+-----+---------+-------+  PTA      134                1.12  triphasic          +--------+------------------+-----+---------+-------+  DP       134                1.12  triphasic          +--------+------------------+-----+---------+-------+  Right Doppler Findings: +--------+--------+-----+---------+--------+  Site     Pressure Index Doppler   Comments  +--------+--------+-----+---------+--------+  Brachial 120            triphasic           +--------+--------+-----+---------+--------+  Radial  triphasic           +--------+--------+-----+---------+--------+  Ulnar                   triphasic           +--------+--------+-----+---------+--------+  Left Doppler Findings: +--------+--------+-----+---------+--------+  Site     Pressure Index Doppler   Comments  +--------+--------+-----+---------+--------+  Brachial 103            triphasic           +--------+--------+-----+---------+--------+  Radial                  triphasic           +--------+--------+-----+---------+--------+  Ulnar                   triphasic           +--------+--------+-----+---------+--------+  Summary: Right Carotid: The extracranial vessels were near-normal with only minimal wall                thickening or plaque. Left Carotid: Velocities in the left ICA are consistent with a 40-59% stenosis. Vertebrals:  Bilateral vertebral arteries demonstrate antegrade flow. Subclavians: Left subclavian artery was stenotic. Right subclavian artery flow              was disturbed. Right ABI: Resting right ankle-brachial index is within normal range. No evidence of significant right lower extremity arterial disease. Left ABI: Resting left ankle-brachial index is within normal range. No evidence of significant left lower extremity arterial disease. Right Upper Extremity: Doppler waveforms remain within normal limits with right radial compression. Doppler waveforms remain within normal limits with right ulnar compression. Left Upper Extremity: Doppler waveforms remain within normal limits with left radial compression. Doppler waveforms remain within normal limits with left ulnar compression.  Electronically signed by Servando Snare MD on 05/11/2021 at 9:27:01 AM.    Final     CT CHEST W CONTRAST 05/07/2021 1. Right upper lobe spiculated lesions consistent with malignancy. Multidisciplinary consult is advised.  2. No evidence of metastatic disease.  3. Aortic Atherosclerosis (ICD10-I70.0) and Emphysema (ICD10-J43.9).  Chest x-ray  05/07/2021:  1. 4.5 cm opacity posteriorly and superiorly in the right lung. While this may represent atelectasis, infection or malignancy is not excluded. Recommend CT chest with contrast for further evaluation. 2. Aortic atherosclerosis  Cardiac Studies: Echocardiogram 05/07/2021:   1. Left ventricular ejection fraction, by estimation, is 40 to 45%. The  left ventricle has mildly decreased function. The left ventricle  demonstrates global hypokinesis. Left ventricular diastolic parameters  were normal.   2. Right ventricular systolic function is normal. The right ventricular  size is normal.   3. The mitral valve is normal in structure. Trivial mitral valve  regurgitation. No evidence of mitral stenosis.   4. The aortic valve is normal in structure. Aortic valve regurgitation is  not visualized. No aortic stenosis is present.  Left Heart Catheterization 05/06/21:  LV: 126/4, EDP 11 mmHg.  Ao 123/46, mean 33 mmHg.  No pressure gradient across aortic valve. LVEF: 40 to 45% with global hypokinesis.  LV is mildly dilated. LM: High-grade, complex, calcified 95% distal left main stenosis. LAD: Proximal segment mild to moderately calcified.  Proximal to mid 60% calcific stenosis.  Gives origin to 3 large diagonals and a fourth large diagonal from the mid to distal segment, mild disease.  No significant calcification mid to distal LAD. CX: Gives origin to high OM1 which is got mild  disease, after that there is a 60% calcific focal stenosis.  Mid to distal circumflex is large, dominant with very mild noncalcific disease. RCA: Nondominant, normal.          Vascular Doppler pre CABG 05/11/2021 Summary:  Right Carotid: The extracranial vessels were near-normal with only minimal wall thickening or plaque.   Left Carotid: Velocities in the left ICA are consistent with a 40-59% stenosis.  Vertebrals:  Bilateral vertebral arteries demonstrate antegrade flow.  Subclavians: Left subclavian artery  was stenotic. Right subclavian artery flow was disturbed.   Right ABI: Resting right ankle-brachial index is within normal range. No evidence of significant right lower extremity arterial disease.  Left ABI: Resting left ankle-brachial index is within normal range. No evidence of significant left lower extremity arterial disease.   Right Upper Extremity: Doppler waveforms remain within normal limits with right radial compression. Doppler waveforms remain within normal limits  with right ulnar compression.  Left Upper Extremity: Doppler waveforms remain within normal limits with left radial compression. Doppler waveforms remain within normal limits  with left ulnar compression.   EKG:   05/01/2021: Normal sinus rhythm at rate of 64 bpm, normal axis, nonspecific T abnormality.  PACs (2).  Compared to 04/09/2021, inferior and lateral ST segment depression suggestive of ischemia no longer present.  Telemetry:  Sinus rhythm, PACs  Scheduled Meds:  aspirin EC  81 mg Oral Daily   atorvastatin  80 mg Oral Daily   heparin  5,000 Units Subcutaneous Q8H   isosorbide mononitrate  30 mg Oral Daily   metoprolol tartrate  25 mg Oral BID   sodium chloride flush  3 mL Intravenous Q12H   sodium chloride flush  3 mL Intravenous Q12H   Continuous Infusions:  sodium chloride     sodium chloride Stopped (05/08/21 0534)   PRN Meds:.sodium chloride, acetaminophen, nitroGLYCERIN, ondansetron (ZOFRAN) IV, sodium chloride flush  Assessment/Plan:  1.  Unstable angina pectoris with critical left main disease 2.  Primary hypertension 3.  Hypercholesterolemia 4.  Right lung upper lobe lesion - consistent with malignancy   Recommendation:  Patient has remained stable form a cardiac standpoint without recurrence of angina or dyspnea. His physical exam remains unchanged.  Echocardiogram during admission revealed LVEF 40-45%, global hypokinesis, no significant valvular abnormality Pre-operative evaluation  revealed left ICA stenosis 40-59%, will plan to follow outpatient Continue aspirin, atorvastatin, Imdur, Lopressor, and PRN nitroglycerin  Continue subcutaneous heparin for DVT prophylaxis   Patient is currently scheduled for CABG and right lung biopsy Tuesday. Given critical left main disease and pending procedures will keep patient inpatient.   Patient was seen in collaboration with Dr. Terri Skains. He also reviewed patient's chart and examined the patient. Dr. Terri Skains is in agreement of the plan.    Alethia Berthold, PA-C 05/11/2021, 10:39 AM Office: [phone]   ADDENDUM: Patient seen and examined independently. Note reviewed with the Lawerance Cruel, PA and relevant changes were made to her note if needed.  I personally reviewed laboratory data, imaging studies and relevant notes.  I independently examined the patient and formulated the important aspects of the plan.  I have personally discussed the plan with the patient and his wife who was present at bedside.  Comments or changes to the note/plan are indicated below.  Patient denies angina pectoris. Hemodynamics remain relatively stable  My Exam:  Vitals with BMI 05/11/2021 05/11/2021 05/11/2021  Height - - -  Weight - - -  BMI - - -  Systolic 998 338 -  Diastolic 66 69 -  Pulse 54 56 66    CONSTITUTIONAL: Well-developed and well-nourished. No acute distress.  SKIN: Skin is warm and dry. No rash noted. No cyanosis. No pallor. No jaundice HEAD: Normocephalic and atraumatic.  EYES: No scleral icterus MOUTH/THROAT: Moist oral membranes.  NECK: No JVD present. No thyromegaly noted. No carotid bruits  LYMPHATIC: No visible cervical adenopathy.  CHEST Normal respiratory effort. No intercostal retractions  LUNGS: Clear to auscultation bilaterally.  No stridor. No wheezes. No rales.  CARDIOVASCULAR: Regular rate and rhythm, positive S1-S2, no murmurs rubs or gallops appreciated ABDOMINAL: No apparent ascites.  EXTREMITIES: No  peripheral edema  HEMATOLOGIC: No significant bruising NEUROLOGIC: Oriented to person, place, and time. Nonfocal. Normal muscle tone.  PSYCHIATRIC: Normal mood and affect. Normal behavior. Cooperative   Assessment & Plan:  Unstable angina with medical left main disease Currently scheduled for surgical revascularization later this week. Presurgical vascular ultrasound notes left ICA stenosis and findings to suggest left subclavian stenosis. Remains anginal free. Telemetry reviewed-sinus rhythm without dysrhythmias  This note was created using a voice recognition software as a result there may be grammatical errors inadvertently enclosed that do not reflect the nature of this encounter. Every attempt is made to correct such errors.  Total time spent: 25 minutes  Signed, Mechele Claude [facility]  Pager: [phone] Office: [phone] 05/11/2021 8:59 PM

## 2021-05-12 ENCOUNTER — Telehealth: Payer: Self-pay

## 2021-05-12 LAB — PREPARE RBC (CROSSMATCH)

## 2021-05-12 LAB — ABO/RH: ABO/RH(D): O POS

## 2021-05-12 LAB — BLOOD GAS, ARTERIAL
Acid-base deficit: 0.1 mmol/L (ref 0.0–2.0)
Bicarbonate: 23.9 mmol/L (ref 20.0–28.0)
Drawn by: 418751
FIO2: 21
O2 Saturation: 94.6 %
Patient temperature: 37
pCO2 arterial: 38.6 mmHg (ref 32.0–48.0)
pH, Arterial: 7.409 (ref 7.350–7.450)
pO2, Arterial: 75.4 mmHg — ABNORMAL LOW (ref 83.0–108.0)

## 2021-05-12 LAB — URINALYSIS, ROUTINE W REFLEX MICROSCOPIC
Bilirubin Urine: NEGATIVE
Glucose, UA: NEGATIVE mg/dL
Hgb urine dipstick: NEGATIVE
Ketones, ur: NEGATIVE mg/dL
Leukocytes,Ua: NEGATIVE
Nitrite: NEGATIVE
Protein, ur: NEGATIVE mg/dL
Specific Gravity, Urine: 1.015 (ref 1.005–1.030)
pH: 6 (ref 5.0–8.0)

## 2021-05-12 MED ORDER — PLASMA-LYTE A IV SOLN
INTRAVENOUS | Status: DC
  Filled 2021-05-12: qty 5

## 2021-05-12 MED ORDER — POTASSIUM CHLORIDE 2 MEQ/ML IV SOLN
80.0000 meq | INTRAVENOUS | Status: DC
  Filled 2021-05-12: qty 40

## 2021-05-12 MED ORDER — TRANEXAMIC ACID 1000 MG/10ML IV SOLN
1.5000 mg/kg/h | INTRAVENOUS | Status: AC
  Administered 2021-05-13: 1.5 mg/kg/h via INTRAVENOUS
  Filled 2021-05-12: qty 25

## 2021-05-12 MED ORDER — CHLORHEXIDINE GLUCONATE CLOTH 2 % EX PADS
6.0000 | MEDICATED_PAD | Freq: Once | CUTANEOUS | Status: AC
  Administered 2021-05-12: 6 via TOPICAL

## 2021-05-12 MED ORDER — CEFAZOLIN SODIUM-DEXTROSE 2-4 GM/100ML-% IV SOLN
2.0000 g | INTRAVENOUS | Status: AC
  Administered 2021-05-13: 2 g via INTRAVENOUS
  Filled 2021-05-12: qty 100

## 2021-05-12 MED ORDER — MANNITOL 20 % IV SOLN
INTRAVENOUS | Status: DC
  Filled 2021-05-12: qty 13

## 2021-05-12 MED ORDER — CHLORHEXIDINE GLUCONATE 0.12 % MT SOLN
15.0000 mL | Freq: Once | OROMUCOSAL | Status: AC
  Administered 2021-05-13: 15 mL via OROMUCOSAL
  Filled 2021-05-12: qty 15

## 2021-05-12 MED ORDER — MILRINONE LACTATE IN DEXTROSE 20-5 MG/100ML-% IV SOLN
0.3000 ug/kg/min | INTRAVENOUS | Status: DC
  Filled 2021-05-12: qty 100

## 2021-05-12 MED ORDER — NOREPINEPHRINE 4 MG/250ML-% IV SOLN
0.0000 ug/min | INTRAVENOUS | Status: DC
  Filled 2021-05-12: qty 250

## 2021-05-12 MED ORDER — TEMAZEPAM 15 MG PO CAPS: 15.0000 mg | ORAL_CAPSULE | Freq: Once | ORAL | Status: DC | PRN

## 2021-05-12 MED ORDER — TRANEXAMIC ACID (OHS) BOLUS VIA INFUSION
15.0000 mg/kg | INTRAVENOUS | Status: AC
  Administered 2021-05-13: 1213.5 mg via INTRAVENOUS
  Filled 2021-05-12: qty 1214

## 2021-05-12 MED ORDER — VANCOMYCIN HCL 1500 MG/300ML IV SOLN
1500.0000 mg | INTRAVENOUS | Status: AC
  Administered 2021-05-13: 1500 mg via INTRAVENOUS
  Filled 2021-05-12: qty 300

## 2021-05-12 MED ORDER — NITROGLYCERIN IN D5W 200-5 MCG/ML-% IV SOLN
2.0000 ug/min | INTRAVENOUS | Status: DC
  Filled 2021-05-12: qty 250

## 2021-05-12 MED ORDER — INSULIN REGULAR(HUMAN) IN NACL 100-0.9 UT/100ML-% IV SOLN
INTRAVENOUS | Status: AC
  Administered 2021-05-13: .8 [IU]/h via INTRAVENOUS
  Filled 2021-05-12: qty 100

## 2021-05-12 MED ORDER — CHLORHEXIDINE GLUCONATE CLOTH 2 % EX PADS
6.0000 | MEDICATED_PAD | Freq: Once | CUTANEOUS | Status: AC
  Administered 2021-05-13: 6 via TOPICAL

## 2021-05-12 MED ORDER — EPINEPHRINE HCL 5 MG/250ML IV SOLN IN NS
0.0000 ug/min | INTRAVENOUS | Status: DC
  Filled 2021-05-12: qty 250

## 2021-05-12 MED ORDER — PHENYLEPHRINE HCL-NACL 20-0.9 MG/250ML-% IV SOLN
30.0000 ug/min | INTRAVENOUS | Status: AC
  Administered 2021-05-13: 30 ug/min via INTRAVENOUS
  Filled 2021-05-12: qty 250

## 2021-05-12 MED ORDER — HEPARIN 30,000 UNITS/1000 ML (OHS) CELLSAVER SOLUTION
Status: DC
  Filled 2021-05-12: qty 1000

## 2021-05-12 MED ORDER — TRANEXAMIC ACID (OHS) PUMP PRIME SOLUTION
2.0000 mg/kg | INTRAVENOUS | Status: DC
  Filled 2021-05-12: qty 1.62

## 2021-05-12 MED ORDER — DEXMEDETOMIDINE HCL IN NACL 400 MCG/100ML IV SOLN
0.1000 ug/kg/h | INTRAVENOUS | Status: AC
  Administered 2021-05-13: .7 ug/kg/h via INTRAVENOUS
  Filled 2021-05-12: qty 100

## 2021-05-12 MED ORDER — BISACODYL 5 MG PO TBEC
5.0000 mg | DELAYED_RELEASE_TABLET | Freq: Once | ORAL | Status: AC
  Administered 2021-05-12: 5 mg via ORAL
  Filled 2021-05-12: qty 1

## 2021-05-12 MED ORDER — METOPROLOL TARTRATE 12.5 MG HALF TABLET
12.5000 mg | ORAL_TABLET | Freq: Once | ORAL | Status: AC
  Administered 2021-05-13: 12.5 mg via ORAL
  Filled 2021-05-12: qty 1

## 2021-05-12 NOTE — Progress Notes (Signed)
Subjective:  Patient seen and examined at bedside at approximately 8:00 AM  Patient up at the sink doing ADLs independently Patient remains asymptomatic without recurrence of angina or shortness of breath since admission.  CBC is stable. Creatinine minimal increased from 1.3-1.4 baseline at 1.57 today.  Patient had episodes of asymptomatic sinus bradycardia overnight   Intake/Output from previous day:  I/O last 3 completed shifts: In: 960 [P.O.:960] Out: 1750 [Urine:1750] No intake/output data recorded.  Blood pressure 131/72, pulse (!) 58, temperature 98.3 F (36.8 C), temperature source Oral, resp. rate 17, height 5' 8"  (1.727 m), weight 80.9 kg, SpO2 95 %.  Physical Exam Vitals reviewed.  Constitutional:      Appearance: Normal appearance.  Neck:     Vascular: No carotid bruit or JVD.  Cardiovascular:     Rate and Rhythm: Normal rate and regular rhythm.     Pulses: Intact distal pulses.     Heart sounds: Normal heart sounds. No murmur heard.   No gallop.  Pulmonary:     Effort: Pulmonary effort is normal.     Breath sounds: Normal breath sounds.  Abdominal:     General: Bowel sounds are normal.     Palpations: Abdomen is soft.  Musculoskeletal:     Right lower leg: No edema.     Left lower leg: No edema.  Skin:    Capillary Refill: Capillary refill takes less than 2 seconds.  Neurological:     General: No focal deficit present.     Mental Status: He is alert and oriented to person, place, and time.  Psychiatric:        Mood and Affect: Mood normal.    Lab Results: BMP BNP (last 3 results) No results for input(s): BNP in the last 8760 hours.  ProBNP (last 3 results) No results for input(s): PROBNP in the last 8760 hours. BMP Latest Ref Rng & Units 05/11/2021 05/06/2021 05/01/2021  Glucose 70 - 99 mg/dL 109(H) - 90  BUN 8 - 23 mg/dL 15 - 20  Creatinine 0.61 - 1.24 mg/dL 1.57(H) 1.37(H) 1.39(H)  BUN/Creat Ratio 10 - 24 - - 14  Sodium 135 - 145 mmol/L 136 - 139   Potassium 3.5 - 5.1 mmol/L 4.4 - 4.6  Chloride 98 - 111 mmol/L 104 - 102  CO2 22 - 32 mmol/L 26 - 23  Calcium 8.9 - 10.3 mg/dL 8.8(L) - 9.7   Hepatic Function Latest Ref Rng & Units 05/11/2021  Total Protein 6.5 - 8.1 g/dL 6.5  Albumin 3.5 - 5.0 g/dL 3.6  AST 15 - 41 U/L 15  ALT 0 - 44 U/L 16  Alk Phosphatase 38 - 126 U/L 63  Total Bilirubin 0.3 - 1.2 mg/dL 1.1   CBC Latest Ref Rng & Units 05/11/2021 05/06/2021 05/01/2021  WBC 4.0 - 10.5 K/uL 4.9 6.6 7.3  Hemoglobin 13.0 - 17.0 g/dL 13.4 12.9(L) 14.5  Hematocrit 39.0 - 52.0 % 38.7(L) 36.8(L) 41.2  Platelets 150 - 400 K/uL 204 191 236   Lipid Panel     Component Value Date/Time   LDLDIRECT 80 05/01/2021 1146   Cardiac Panel (last 3 results) No results for input(s): CKTOTAL, CKMB, TROPONINI, RELINDX in the last 72 hours.  HEMOGLOBIN A1C No results found for: HGBA1C, MPG TSH Recent Labs    05/07/21 0915  TSH 3.714    Imaging: No results found.  CT CHEST W CONTRAST 05/07/2021 1. Right upper lobe spiculated lesions consistent with malignancy. Multidisciplinary consult is advised.  2. No  evidence of metastatic disease.  3. Aortic Atherosclerosis (ICD10-I70.0) and Emphysema (ICD10-J43.9).  Chest x-ray 05/07/2021:  1. 4.5 cm opacity posteriorly and superiorly in the right lung. While this may represent atelectasis, infection or malignancy is not excluded. Recommend CT chest with contrast for further evaluation. 2. Aortic atherosclerosis  Cardiac Studies: Echocardiogram 05/07/2021:   1. Left ventricular ejection fraction, by estimation, is 40 to 45%. The  left ventricle has mildly decreased function. The left ventricle  demonstrates global hypokinesis. Left ventricular diastolic parameters  were normal.   2. Right ventricular systolic function is normal. The right ventricular  size is normal.   3. The mitral valve is normal in structure. Trivial mitral valve  regurgitation. No evidence of mitral stenosis.   4. The aortic  valve is normal in structure. Aortic valve regurgitation is  not visualized. No aortic stenosis is present.  Left Heart Catheterization 05/06/21:  LV: 126/4, EDP 11 mmHg.  Ao 123/46, mean 33 mmHg.  No pressure gradient across aortic valve. LVEF: 40 to 45% with global hypokinesis.  LV is mildly dilated. LM: High-grade, complex, calcified 95% distal left main stenosis. LAD: Proximal segment mild to moderately calcified.  Proximal to mid 60% calcific stenosis.  Gives origin to 3 large diagonals and a fourth large diagonal from the mid to distal segment, mild disease.  No significant calcification mid to distal LAD. CX: Gives origin to high OM1 which is got mild disease, after that there is a 60% calcific focal stenosis.  Mid to distal circumflex is large, dominant with very mild noncalcific disease. RCA: Nondominant, normal.          Vascular Doppler pre CABG 05/11/2021 Summary:  Right Carotid: The extracranial vessels were near-normal with only minimal wall thickening or plaque.   Left Carotid: Velocities in the left ICA are consistent with a 40-59% stenosis.  Vertebrals:  Bilateral vertebral arteries demonstrate antegrade flow.  Subclavians: Left subclavian artery was stenotic. Right subclavian artery flow was disturbed.   Right ABI: Resting right ankle-brachial index is within normal range. No evidence of significant right lower extremity arterial disease.  Left ABI: Resting left ankle-brachial index is within normal range. No evidence of significant left lower extremity arterial disease.   Right Upper Extremity: Doppler waveforms remain within normal limits with right radial compression. Doppler waveforms remain within normal limits  with right ulnar compression.  Left Upper Extremity: Doppler waveforms remain within normal limits with left radial compression. Doppler waveforms remain within normal limits  with left ulnar compression.   EKG:   05/01/2021: Normal sinus rhythm at  rate of 64 bpm, normal axis, nonspecific T abnormality.  PACs (2).  Compared to 04/09/2021, inferior and lateral ST segment depression suggestive of ischemia no longer present.  Telemetry:  Sinus rhythm, PACs, sinus bradycardia overnight   Scheduled Meds:  aspirin EC  81 mg Oral Daily   atorvastatin  80 mg Oral Daily   heparin  5,000 Units Subcutaneous Q8H   isosorbide mononitrate  30 mg Oral Daily   metoprolol tartrate  25 mg Oral BID   sodium chloride flush  3 mL Intravenous Q12H   sodium chloride flush  3 mL Intravenous Q12H   Continuous Infusions:  sodium chloride     sodium chloride Stopped (05/08/21 0534)   PRN Meds:.sodium chloride, acetaminophen, nitroGLYCERIN, ondansetron (ZOFRAN) IV, sodium chloride flush  Assessment/Plan:  1.  Unstable angina pectoris with critical left main disease 2.  Primary hypertension 3.  Hypercholesterolemia 4.  Right lung upper lobe  lesion - consistent with malignancy   Recommendation:  Scheduled for surgical revascularization and right lung biopsy tomorrow  Remains anginal free Telemetry revealed episodes of sinus bradycardia overnight which were asymptomatic. Follow-up creatinine has mildly trended up, but is overall relatively stable. Presurgical vascular ultrasound notes left ICA stenosis and findings to suggest left subclavian stenosis. Echocardiogram during admission revealed LVEF 40-45%, global hypokinesis, no significant valvular abnormality Continue aspirin, atorvastatin, Imdur, Lopressor, and PRN nitroglycerin  Continue subcutaneous heparin for DVT prophylaxis  Continue to monitor BMP post-op   Patient was seen in collaboration with Dr. Einar Gip and he is in agreement with the plan.     Alethia Berthold, PA-C 05/12/2021, 8:24 AM Office: [phone]

## 2021-05-12 NOTE — Progress Notes (Signed)
Heart Failure Navigator Progress Note  Assessed for Heart & Vascular TOC clinic readiness.  Plan for CABG this hospitalization, pt follows with [facility] and Dr. Einar Gip.   EF: 40-45%  Navigator available for educational resources.  Pricilla Holm, MSN, RN Heart Failure Nurse Navigator [phone]

## 2021-05-12 NOTE — Care Management Important Message (Signed)
Important Message  Patient Details  Name: Marwin Primmer MRN: [id number] Date of Birth: 09-03-1940   Medicare Important Message Given:  Yes     Shelda Altes 05/12/2021, 7:56 AM

## 2021-05-12 NOTE — TOC Progression Note (Signed)
Transition of Care [facility]) - Progression Note    Patient Details  Name: Kevin [name] MRN: [id number] Date of Birth: [birth year]  Transition of [facility]) CM/SW Contact  Zenon Mayo, RN Phone Number: 05/12/2021, 11:10 AM  Clinical Narrative:    For CABG Right Lung BX, tomorrow. TOC will continue to follow for dc needs.         Expected Discharge Plan and Services                                                 Social Determinants of Health (SDOH) Interventions    Readmission Risk Interventions No flowsheet data found.

## 2021-05-12 NOTE — Telephone Encounter (Signed)
Please let her know that according to the policy, only one person is allowed, but due to her anxiety, we will allow one more person with her

## 2021-05-13 ENCOUNTER — Inpatient Hospital Stay (HOSPITAL_COMMUNITY): Payer: Medicare HMO | Admitting: Anesthesiology

## 2021-05-13 ENCOUNTER — Encounter (HOSPITAL_COMMUNITY): Payer: Self-pay | Admitting: Cardiology

## 2021-05-13 ENCOUNTER — Inpatient Hospital Stay (HOSPITAL_COMMUNITY): Payer: Medicare HMO

## 2021-05-13 ENCOUNTER — Inpatient Hospital Stay (HOSPITAL_COMMUNITY)
Admission: RE | Disposition: A | Discharge status: Home / Self Care | Payer: Self-pay | Source: Home / Self Care | Attending: Cardiology

## 2021-05-13 DIAGNOSIS — I25110 Atherosclerotic heart disease of native coronary artery with unstable angina pectoris: Principal | ICD-10-CM

## 2021-05-13 DIAGNOSIS — Z9911 Dependence on respirator [ventilator] status: Secondary | ICD-10-CM | POA: Diagnosis not present

## 2021-05-13 DIAGNOSIS — R918 Other nonspecific abnormal finding of lung field: Secondary | ICD-10-CM

## 2021-05-13 HISTORY — PX: CORONARY ARTERY BYPASS GRAFT: SHX141

## 2021-05-13 HISTORY — PX: TEE WITHOUT CARDIOVERSION: SHX5443

## 2021-05-13 HISTORY — PX: ENDOVEIN HARVEST OF GREATER SAPHENOUS VEIN: SHX5059

## 2021-05-13 HISTORY — PX: LUNG BIOPSY: SHX5088

## 2021-05-13 LAB — POCT I-STAT 7, (LYTES, BLD GAS, ICA,H+H)
Acid-Base Excess: 2 mmol/L (ref 0.0–2.0)
Acid-Base Excess: 4 mmol/L — ABNORMAL HIGH (ref 0.0–2.0)
Acid-Base Excess: 0 mmol/L (ref 0.0–2.0)
Acid-Base Excess: 2 mmol/L (ref 0.0–2.0)
Acid-base deficit: 2 mmol/L (ref 0.0–2.0)
Acid-base deficit: 3 mmol/L — ABNORMAL HIGH (ref 0.0–2.0)
Acid-base deficit: 3 mmol/L — ABNORMAL HIGH (ref 0.0–2.0)
Acid-base deficit: 7 mmol/L — ABNORMAL HIGH (ref 0.0–2.0)
Acid-base deficit: 5 mmol/L — ABNORMAL HIGH (ref 0.0–2.0)
Acid-base deficit: 4 mmol/L — ABNORMAL HIGH (ref 0.0–2.0)
Bicarbonate: 24.5 mmol/L (ref 20.0–28.0)
Bicarbonate: 27 mmol/L (ref 20.0–28.0)
Bicarbonate: 28.6 mmol/L — ABNORMAL HIGH (ref 20.0–28.0)
Bicarbonate: 24.7 mmol/L (ref 20.0–28.0)
Bicarbonate: 25.8 mmol/L (ref 20.0–28.0)
Bicarbonate: 22.9 mmol/L (ref 20.0–28.0)
Bicarbonate: 23.3 mmol/L (ref 20.0–28.0)
Bicarbonate: 18.9 mmol/L — ABNORMAL LOW (ref 20.0–28.0)
Bicarbonate: 19.7 mmol/L — ABNORMAL LOW (ref 20.0–28.0)
Bicarbonate: 21 mmol/L (ref 20.0–28.0)
Calcium, Ion: 1.25 mmol/L (ref 1.15–1.40)
Calcium, Ion: 1 mmol/L — ABNORMAL LOW (ref 1.15–1.40)
Calcium, Ion: 1.06 mmol/L — ABNORMAL LOW (ref 1.15–1.40)
Calcium, Ion: 1.01 mmol/L — ABNORMAL LOW (ref 1.15–1.40)
Calcium, Ion: 1.02 mmol/L — ABNORMAL LOW (ref 1.15–1.40)
Calcium, Ion: 1.29 mmol/L (ref 1.15–1.40)
Calcium, Ion: 1.24 mmol/L (ref 1.15–1.40)
Calcium, Ion: 1.1 mmol/L — ABNORMAL LOW (ref 1.15–1.40)
Calcium, Ion: 1.11 mmol/L — ABNORMAL LOW (ref 1.15–1.40)
Calcium, Ion: 1.12 mmol/L — ABNORMAL LOW (ref 1.15–1.40)
HCT: 38 % — ABNORMAL LOW (ref 39.0–52.0)
HCT: 28 % — ABNORMAL LOW (ref 39.0–52.0)
HCT: 28 % — ABNORMAL LOW (ref 39.0–52.0)
HCT: 27 % — ABNORMAL LOW (ref 39.0–52.0)
HCT: 28 % — ABNORMAL LOW (ref 39.0–52.0)
HCT: 29 % — ABNORMAL LOW (ref 39.0–52.0)
HCT: 33 % — ABNORMAL LOW (ref 39.0–52.0)
HCT: 27 % — ABNORMAL LOW (ref 39.0–52.0)
HCT: 28 % — ABNORMAL LOW (ref 39.0–52.0)
HCT: 28 % — ABNORMAL LOW (ref 39.0–52.0)
Hemoglobin: 12.9 g/dL — ABNORMAL LOW (ref 13.0–17.0)
Hemoglobin: 9.5 g/dL — ABNORMAL LOW (ref 13.0–17.0)
Hemoglobin: 9.5 g/dL — ABNORMAL LOW (ref 13.0–17.0)
Hemoglobin: 9.2 g/dL — ABNORMAL LOW (ref 13.0–17.0)
Hemoglobin: 9.5 g/dL — ABNORMAL LOW (ref 13.0–17.0)
Hemoglobin: 9.9 g/dL — ABNORMAL LOW (ref 13.0–17.0)
Hemoglobin: 11.2 g/dL — ABNORMAL LOW (ref 13.0–17.0)
Hemoglobin: 9.2 g/dL — ABNORMAL LOW (ref 13.0–17.0)
Hemoglobin: 9.5 g/dL — ABNORMAL LOW (ref 13.0–17.0)
Hemoglobin: 9.5 g/dL — ABNORMAL LOW (ref 13.0–17.0)
O2 Saturation: 100 %
O2 Saturation: 100 %
O2 Saturation: 100 %
O2 Saturation: 100 %
O2 Saturation: 100 %
O2 Saturation: 96 %
O2 Saturation: 94 %
O2 Saturation: 95 %
O2 Saturation: 95 %
O2 Saturation: 95 %
Patient temperature: 37.1
Patient temperature: 37.1
Patient temperature: 37.3
Patient temperature: 37.4
Potassium: 4.6 mmol/L (ref 3.5–5.1)
Potassium: 5 mmol/L (ref 3.5–5.1)
Potassium: 6.4 mmol/L (ref 3.5–5.1)
Potassium: 6.7 mmol/L (ref 3.5–5.1)
Potassium: 7 mmol/L (ref 3.5–5.1)
Potassium: 5.9 mmol/L — ABNORMAL HIGH (ref 3.5–5.1)
Potassium: 5.3 mmol/L — ABNORMAL HIGH (ref 3.5–5.1)
Potassium: 4.3 mmol/L (ref 3.5–5.1)
Potassium: 4.3 mmol/L (ref 3.5–5.1)
Potassium: 4.3 mmol/L (ref 3.5–5.1)
Sodium: 137 mmol/L (ref 135–145)
Sodium: 132 mmol/L — ABNORMAL LOW (ref 135–145)
Sodium: 134 mmol/L — ABNORMAL LOW (ref 135–145)
Sodium: 131 mmol/L — ABNORMAL LOW (ref 135–145)
Sodium: 132 mmol/L — ABNORMAL LOW (ref 135–145)
Sodium: 134 mmol/L — ABNORMAL LOW (ref 135–145)
Sodium: 135 mmol/L (ref 135–145)
Sodium: 137 mmol/L (ref 135–145)
Sodium: 137 mmol/L (ref 135–145)
Sodium: 136 mmol/L (ref 135–145)
TCO2: 26 mmol/L (ref 22–32)
TCO2: 28 mmol/L (ref 22–32)
TCO2: 30 mmol/L (ref 22–32)
TCO2: 26 mmol/L (ref 22–32)
TCO2: 27 mmol/L (ref 22–32)
TCO2: 24 mmol/L (ref 22–32)
TCO2: 25 mmol/L (ref 22–32)
TCO2: 20 mmol/L — ABNORMAL LOW (ref 22–32)
TCO2: 21 mmol/L — ABNORMAL LOW (ref 22–32)
TCO2: 22 mmol/L (ref 22–32)
pCO2 arterial: 45.6 mmHg (ref 32.0–48.0)
pCO2 arterial: 40.7 mmHg (ref 32.0–48.0)
pCO2 arterial: 44.8 mmHg (ref 32.0–48.0)
pCO2 arterial: 37.9 mmHg (ref 32.0–48.0)
pCO2 arterial: 38.3 mmHg (ref 32.0–48.0)
pCO2 arterial: 45.4 mmHg (ref 32.0–48.0)
pCO2 arterial: 47.5 mmHg (ref 32.0–48.0)
pCO2 arterial: 36.3 mmHg (ref 32.0–48.0)
pCO2 arterial: 36.4 mmHg (ref 32.0–48.0)
pCO2 arterial: 39.1 mmHg (ref 32.0–48.0)
pH, Arterial: 7.338 — ABNORMAL LOW (ref 7.350–7.450)
pH, Arterial: 7.388 (ref 7.350–7.450)
pH, Arterial: 7.454 — ABNORMAL HIGH (ref 7.350–7.450)
pH, Arterial: 7.418 (ref 7.350–7.450)
pH, Arterial: 7.441 (ref 7.350–7.450)
pH, Arterial: 7.311 — ABNORMAL LOW (ref 7.350–7.450)
pH, Arterial: 7.3 — ABNORMAL LOW (ref 7.350–7.450)
pH, Arterial: 7.324 — ABNORMAL LOW (ref 7.350–7.450)
pH, Arterial: 7.343 — ABNORMAL LOW (ref 7.350–7.450)
pH, Arterial: 7.34 — ABNORMAL LOW (ref 7.350–7.450)
pO2, Arterial: 394 mmHg — ABNORMAL HIGH (ref 83.0–108.0)
pO2, Arterial: 332 mmHg — ABNORMAL HIGH (ref 83.0–108.0)
pO2, Arterial: 354 mmHg — ABNORMAL HIGH (ref 83.0–108.0)
pO2, Arterial: 228 mmHg — ABNORMAL HIGH (ref 83.0–108.0)
pO2, Arterial: 250 mmHg — ABNORMAL HIGH (ref 83.0–108.0)
pO2, Arterial: 89 mmHg (ref 83.0–108.0)
pO2, Arterial: 82 mmHg — ABNORMAL LOW (ref 83.0–108.0)
pO2, Arterial: 79 mmHg — ABNORMAL LOW (ref 83.0–108.0)
pO2, Arterial: 79 mmHg — ABNORMAL LOW (ref 83.0–108.0)
pO2, Arterial: 83 mmHg (ref 83.0–108.0)

## 2021-05-13 LAB — CBC
HCT: 39.5 % (ref 39.0–52.0)
HCT: 33.6 % — ABNORMAL LOW (ref 39.0–52.0)
HCT: 29.6 % — ABNORMAL LOW (ref 39.0–52.0)
Hemoglobin: 13.6 g/dL (ref 13.0–17.0)
Hemoglobin: 11.3 g/dL — ABNORMAL LOW (ref 13.0–17.0)
Hemoglobin: 10.4 g/dL — ABNORMAL LOW (ref 13.0–17.0)
MCH: 31.9 pg (ref 26.0–34.0)
MCH: 31.9 pg (ref 26.0–34.0)
MCH: 32.9 pg (ref 26.0–34.0)
MCHC: 34.4 g/dL (ref 30.0–36.0)
MCHC: 33.6 g/dL (ref 30.0–36.0)
MCHC: 35.1 g/dL (ref 30.0–36.0)
MCV: 92.7 fL (ref 80.0–100.0)
MCV: 94.9 fL (ref 80.0–100.0)
MCV: 93.7 fL (ref 80.0–100.0)
Platelets: 217 10*3/uL (ref 150–400)
Platelets: 142 10*3/uL — ABNORMAL LOW (ref 150–400)
Platelets: 122 10*3/uL — ABNORMAL LOW (ref 150–400)
RBC: 4.26 MIL/uL (ref 4.22–5.81)
RBC: 3.54 MIL/uL — ABNORMAL LOW (ref 4.22–5.81)
RBC: 3.16 MIL/uL — ABNORMAL LOW (ref 4.22–5.81)
RDW: 12.6 % (ref 11.5–15.5)
RDW: 12.4 % (ref 11.5–15.5)
RDW: 12.4 % (ref 11.5–15.5)
WBC: 6.1 10*3/uL (ref 4.0–10.5)
WBC: 8.9 10*3/uL (ref 4.0–10.5)
WBC: 9.1 10*3/uL (ref 4.0–10.5)
nRBC: 0 % (ref 0.0–0.2)
nRBC: 0 % (ref 0.0–0.2)
nRBC: 0 % (ref 0.0–0.2)

## 2021-05-13 LAB — BASIC METABOLIC PANEL
Anion gap: 8 (ref 5–15)
Anion gap: 7 (ref 5–15)
BUN: 20 mg/dL (ref 8–23)
BUN: 17 mg/dL (ref 8–23)
CO2: 23 mmol/L (ref 22–32)
CO2: 22 mmol/L (ref 22–32)
Calcium: 9.3 mg/dL (ref 8.9–10.3)
Calcium: 7.8 mg/dL — ABNORMAL LOW (ref 8.9–10.3)
Chloride: 105 mmol/L (ref 98–111)
Chloride: 105 mmol/L (ref 98–111)
Creatinine, Ser: 1.57 mg/dL — ABNORMAL HIGH (ref 0.61–1.24)
Creatinine, Ser: 1.35 mg/dL — ABNORMAL HIGH (ref 0.61–1.24)
GFR, Estimated: 45 mL/min — ABNORMAL LOW (ref >60)
GFR, Estimated: 54 mL/min — ABNORMAL LOW (ref >60)
Glucose, Bld: 95 mg/dL (ref 70–99)
Glucose, Bld: 138 mg/dL — ABNORMAL HIGH (ref 70–99)
Potassium: 4.4 mmol/L (ref 3.5–5.1)
Potassium: 4.8 mmol/L (ref 3.5–5.1)
Sodium: 136 mmol/L (ref 135–145)
Sodium: 134 mmol/L — ABNORMAL LOW (ref 135–145)

## 2021-05-13 LAB — HEMOGLOBIN A1C
Hgb A1c MFr Bld: 5.2 % (ref 4.8–5.6)
Mean Plasma Glucose: 102.54 mg/dL

## 2021-05-13 LAB — POCT I-STAT, CHEM 8
BUN: 18 mg/dL (ref 8–23)
BUN: 20 mg/dL (ref 8–23)
BUN: 19 mg/dL (ref 8–23)
BUN: 19 mg/dL (ref 8–23)
BUN: 18 mg/dL (ref 8–23)
Calcium, Ion: 1.2 mmol/L (ref 1.15–1.40)
Calcium, Ion: 1.28 mmol/L (ref 1.15–1.40)
Calcium, Ion: 1 mmol/L — ABNORMAL LOW (ref 1.15–1.40)
Calcium, Ion: 1.01 mmol/L — ABNORMAL LOW (ref 1.15–1.40)
Calcium, Ion: 1.29 mmol/L (ref 1.15–1.40)
Chloride: 104 mmol/L (ref 98–111)
Chloride: 102 mmol/L (ref 98–111)
Chloride: 98 mmol/L (ref 98–111)
Chloride: 101 mmol/L (ref 98–111)
Chloride: 103 mmol/L (ref 98–111)
Creatinine, Ser: 1.4 mg/dL — ABNORMAL HIGH (ref 0.61–1.24)
Creatinine, Ser: 1.3 mg/dL — ABNORMAL HIGH (ref 0.61–1.24)
Creatinine, Ser: 1.2 mg/dL (ref 0.61–1.24)
Creatinine, Ser: 1.2 mg/dL (ref 0.61–1.24)
Creatinine, Ser: 1.2 mg/dL (ref 0.61–1.24)
Glucose, Bld: 99 mg/dL (ref 70–99)
Glucose, Bld: 105 mg/dL — ABNORMAL HIGH (ref 70–99)
Glucose, Bld: 102 mg/dL — ABNORMAL HIGH (ref 70–99)
Glucose, Bld: 116 mg/dL — ABNORMAL HIGH (ref 70–99)
Glucose, Bld: 128 mg/dL — ABNORMAL HIGH (ref 70–99)
HCT: 37 % — ABNORMAL LOW (ref 39.0–52.0)
HCT: 38 % — ABNORMAL LOW (ref 39.0–52.0)
HCT: 26 % — ABNORMAL LOW (ref 39.0–52.0)
HCT: 26 % — ABNORMAL LOW (ref 39.0–52.0)
HCT: 26 % — ABNORMAL LOW (ref 39.0–52.0)
Hemoglobin: 12.6 g/dL — ABNORMAL LOW (ref 13.0–17.0)
Hemoglobin: 12.9 g/dL — ABNORMAL LOW (ref 13.0–17.0)
Hemoglobin: 8.8 g/dL — ABNORMAL LOW (ref 13.0–17.0)
Hemoglobin: 8.8 g/dL — ABNORMAL LOW (ref 13.0–17.0)
Hemoglobin: 8.8 g/dL — ABNORMAL LOW (ref 13.0–17.0)
Potassium: 4.6 mmol/L (ref 3.5–5.1)
Potassium: 5.1 mmol/L (ref 3.5–5.1)
Potassium: 6.4 mmol/L (ref 3.5–5.1)
Potassium: 6.4 mmol/L (ref 3.5–5.1)
Potassium: 5.9 mmol/L — ABNORMAL HIGH (ref 3.5–5.1)
Sodium: 137 mmol/L (ref 135–145)
Sodium: 136 mmol/L (ref 135–145)
Sodium: 134 mmol/L — ABNORMAL LOW (ref 135–145)
Sodium: 133 mmol/L — ABNORMAL LOW (ref 135–145)
Sodium: 134 mmol/L — ABNORMAL LOW (ref 135–145)
TCO2: 27 mmol/L (ref 22–32)
TCO2: 27 mmol/L (ref 22–32)
TCO2: 29 mmol/L (ref 22–32)
TCO2: 27 mmol/L (ref 22–32)
TCO2: 25 mmol/L (ref 22–32)

## 2021-05-13 LAB — HEMOGLOBIN AND HEMATOCRIT, BLOOD
HCT: 26.2 % — ABNORMAL LOW (ref 39.0–52.0)
Hemoglobin: 9.3 g/dL — ABNORMAL LOW (ref 13.0–17.0)

## 2021-05-13 LAB — APTT
aPTT: 36 seconds (ref 24–36)
aPTT: 30 seconds (ref 24–36)

## 2021-05-13 LAB — GLUCOSE, CAPILLARY
Glucose-Capillary: 117 mg/dL — ABNORMAL HIGH (ref 70–99)
Glucose-Capillary: 136 mg/dL — ABNORMAL HIGH (ref 70–99)
Glucose-Capillary: 111 mg/dL — ABNORMAL HIGH (ref 70–99)
Glucose-Capillary: 119 mg/dL — ABNORMAL HIGH (ref 70–99)
Glucose-Capillary: 129 mg/dL — ABNORMAL HIGH (ref 70–99)
Glucose-Capillary: 142 mg/dL — ABNORMAL HIGH (ref 70–99)
Glucose-Capillary: 150 mg/dL — ABNORMAL HIGH (ref 70–99)
Glucose-Capillary: 168 mg/dL — ABNORMAL HIGH (ref 70–99)
Glucose-Capillary: 150 mg/dL — ABNORMAL HIGH (ref 70–99)
Glucose-Capillary: 158 mg/dL — ABNORMAL HIGH (ref 70–99)

## 2021-05-13 LAB — POCT I-STAT EG7
Acid-base deficit: 3 mmol/L — ABNORMAL HIGH (ref 0.0–2.0)
Bicarbonate: 22.7 mmol/L (ref 20.0–28.0)
Calcium, Ion: 0.97 mmol/L — ABNORMAL LOW (ref 1.15–1.40)
HCT: 24 % — ABNORMAL LOW (ref 39.0–52.0)
Hemoglobin: 8.2 g/dL — ABNORMAL LOW (ref 13.0–17.0)
O2 Saturation: 84 %
Potassium: 6.4 mmol/L (ref 3.5–5.1)
Sodium: 133 mmol/L — ABNORMAL LOW (ref 135–145)
TCO2: 24 mmol/L (ref 22–32)
pCO2, Ven: 41.9 mmHg — ABNORMAL LOW (ref 44.0–60.0)
pH, Ven: 7.342 (ref 7.250–7.430)
pO2, Ven: 52 mmHg — ABNORMAL HIGH (ref 32.0–45.0)

## 2021-05-13 LAB — ECHO INTRAOPERATIVE TEE
Height: 68 in
Weight: 2853.63 oz

## 2021-05-13 LAB — PROTIME-INR
INR: 1 (ref 0.8–1.2)
INR: 1.3 — ABNORMAL HIGH (ref 0.8–1.2)
Prothrombin Time: 13 seconds (ref 11.4–15.2)
Prothrombin Time: 15.8 seconds — ABNORMAL HIGH (ref 11.4–15.2)

## 2021-05-13 LAB — PLATELET COUNT: Platelets: 167 10*3/uL (ref 150–400)

## 2021-05-13 LAB — MAGNESIUM: Magnesium: 3.1 mg/dL — ABNORMAL HIGH (ref 1.7–2.4)

## 2021-05-13 SURGERY — CORONARY ARTERY BYPASS GRAFTING (CABG)
Anesthesia: General | Site: Chest | Laterality: Right

## 2021-05-13 MED ORDER — PHENYLEPHRINE 40 MCG/ML (10ML) SYRINGE FOR IV PUSH (FOR BLOOD PRESSURE SUPPORT)
PREFILLED_SYRINGE | INTRAVENOUS | Status: AC
  Filled 2021-05-13: qty 10

## 2021-05-13 MED ORDER — SODIUM CHLORIDE 0.9 % IV SOLN: 250.0000 mL | INTRAVENOUS | Status: DC

## 2021-05-13 MED ORDER — 0.9 % SODIUM CHLORIDE (POUR BTL) OPTIME
TOPICAL | Status: DC | PRN
  Administered 2021-05-13: 5000 mL

## 2021-05-13 MED ORDER — HEPARIN SODIUM (PORCINE) 1000 UNIT/ML IJ SOLN
INTRAMUSCULAR | Status: AC
  Filled 2021-05-13: qty 30

## 2021-05-13 MED ORDER — SODIUM CHLORIDE 0.9% FLUSH: 10.0000 mL | INTRAVENOUS | Status: DC | PRN

## 2021-05-13 MED ORDER — LACTATED RINGERS IV SOLN: 500.0000 mL | Freq: Once | INTRAVENOUS | Status: DC | PRN

## 2021-05-13 MED ORDER — ORAL CARE MOUTH RINSE
15.0000 mL | Freq: Two times a day (BID) | OROMUCOSAL | Status: DC
  Administered 2021-05-14 – 2021-05-16 (×4): 15 mL via OROMUCOSAL

## 2021-05-13 MED ORDER — BISACODYL 10 MG RE SUPP: 10.0000 mg | Freq: Every day | RECTAL | Status: DC

## 2021-05-13 MED ORDER — LACTATED RINGERS IV SOLN: INTRAVENOUS | Status: DC

## 2021-05-13 MED ORDER — NITROGLYCERIN IN D5W 200-5 MCG/ML-% IV SOLN: 0.0000 ug/min | INTRAVENOUS | Status: DC

## 2021-05-13 MED ORDER — EPINEPHRINE HCL 5 MG/250ML IV SOLN IN NS: 0.0000 ug/min | INTRAVENOUS | Status: DC

## 2021-05-13 MED ORDER — ALBUMIN HUMAN 5 % IV SOLN
250.0000 mL | INTRAVENOUS | Status: AC | PRN
  Administered 2021-05-13 (×4): 12.5 g via INTRAVENOUS
  Filled 2021-05-13 (×2): qty 250

## 2021-05-13 MED ORDER — ASPIRIN 81 MG PO CHEW: 324.0000 mg | CHEWABLE_TABLET | Freq: Every day | ORAL | Status: DC

## 2021-05-13 MED ORDER — ARTIFICIAL TEARS OPHTHALMIC OINT
TOPICAL_OINTMENT | OPHTHALMIC | Status: DC | PRN
  Administered 2021-05-13: 1 via OPHTHALMIC

## 2021-05-13 MED ORDER — PROPOFOL 10 MG/ML IV BOLUS
INTRAVENOUS | Status: AC
  Filled 2021-05-13: qty 20

## 2021-05-13 MED ORDER — PHENYLEPHRINE 40 MCG/ML (10ML) SYRINGE FOR IV PUSH (FOR BLOOD PRESSURE SUPPORT)
PREFILLED_SYRINGE | INTRAVENOUS | Status: DC | PRN
  Administered 2021-05-13 (×2): 40 ug via INTRAVENOUS
  Administered 2021-05-13: 120 ug via INTRAVENOUS
  Administered 2021-05-13: 40 ug via INTRAVENOUS

## 2021-05-13 MED ORDER — METOPROLOL TARTRATE 5 MG/5ML IV SOLN: 2.5000 mg | INTRAVENOUS | Status: DC | PRN

## 2021-05-13 MED ORDER — OXYCODONE HCL 5 MG PO TABS
5.0000 mg | ORAL_TABLET | ORAL | Status: DC | PRN
  Administered 2021-05-13: 5 mg via ORAL
  Administered 2021-05-14 (×2): 10 mg via ORAL
  Administered 2021-05-14: 5 mg via ORAL
  Administered 2021-05-14 (×2): 10 mg via ORAL
  Filled 2021-05-13 (×3): qty 2
  Filled 2021-05-13 (×2): qty 1
  Filled 2021-05-13 (×2): qty 2

## 2021-05-13 MED ORDER — LACTATED RINGERS IV SOLN: INTRAVENOUS | Status: DC | PRN

## 2021-05-13 MED ORDER — ONDANSETRON HCL 4 MG/2ML IJ SOLN: 4.0000 mg | Freq: Four times a day (QID) | INTRAMUSCULAR | Status: DC | PRN

## 2021-05-13 MED ORDER — CHLORHEXIDINE GLUCONATE 0.12% ORAL RINSE (MEDLINE KIT)
15.0000 mL | Freq: Two times a day (BID) | OROMUCOSAL | Status: DC
  Administered 2021-05-13 – 2021-05-14 (×2): 15 mL via OROMUCOSAL

## 2021-05-13 MED ORDER — CHLORHEXIDINE GLUCONATE 0.12 % MT SOLN
15.0000 mL | OROMUCOSAL | Status: AC
  Administered 2021-05-13: 15 mL via OROMUCOSAL

## 2021-05-13 MED ORDER — CEFAZOLIN SODIUM-DEXTROSE 2-4 GM/100ML-% IV SOLN
2.0000 g | Freq: Three times a day (TID) | INTRAVENOUS | Status: AC
  Administered 2021-05-13 – 2021-05-15 (×6): 2 g via INTRAVENOUS
  Filled 2021-05-13 (×6): qty 100

## 2021-05-13 MED ORDER — DEXTROSE 50 % IV SOLN: 0.0000 mL | INTRAVENOUS | Status: DC | PRN

## 2021-05-13 MED ORDER — ACETAMINOPHEN 500 MG PO TABS
1000.0000 mg | ORAL_TABLET | Freq: Four times a day (QID) | ORAL | Status: DC
  Administered 2021-05-13 – 2021-05-17 (×10): 1000 mg via ORAL
  Filled 2021-05-13 (×10): qty 2

## 2021-05-13 MED ORDER — FAMOTIDINE IN NACL 20-0.9 MG/50ML-% IV SOLN
20.0000 mg | Freq: Two times a day (BID) | INTRAVENOUS | Status: DC
  Administered 2021-05-13: 20 mg via INTRAVENOUS
  Filled 2021-05-13: qty 50

## 2021-05-13 MED ORDER — MIDAZOLAM HCL (PF) 5 MG/ML IJ SOLN
INTRAMUSCULAR | Status: DC | PRN
  Administered 2021-05-13 (×2): .5 mg via INTRAVENOUS
  Administered 2021-05-13: 1.5 mg via INTRAVENOUS
  Administered 2021-05-13: .5 mg via INTRAVENOUS

## 2021-05-13 MED ORDER — DEXMEDETOMIDINE HCL IN NACL 400 MCG/100ML IV SOLN
0.0000 ug/kg/h | INTRAVENOUS | Status: DC
  Administered 2021-05-14: 0.1 ug/kg/h via INTRAVENOUS
  Filled 2021-05-13: qty 100

## 2021-05-13 MED ORDER — NOREPINEPHRINE 4 MG/250ML-% IV SOLN: 0.0000 ug/min | INTRAVENOUS | Status: DC

## 2021-05-13 MED ORDER — MIDAZOLAM HCL 2 MG/2ML IJ SOLN: 2.0000 mg | INTRAMUSCULAR | Status: DC | PRN

## 2021-05-13 MED ORDER — TRAMADOL HCL 50 MG PO TABS
50.0000 mg | ORAL_TABLET | ORAL | Status: DC | PRN
  Administered 2021-05-14 – 2021-05-15 (×4): 100 mg via ORAL
  Filled 2021-05-13 (×4): qty 2

## 2021-05-13 MED ORDER — METOCLOPRAMIDE HCL 5 MG/ML IJ SOLN
10.0000 mg | Freq: Four times a day (QID) | INTRAMUSCULAR | Status: AC
  Administered 2021-05-13 – 2021-05-14 (×4): 10 mg via INTRAVENOUS
  Filled 2021-05-13 (×4): qty 2

## 2021-05-13 MED ORDER — FENTANYL CITRATE (PF) 250 MCG/5ML IJ SOLN
INTRAMUSCULAR | Status: AC
  Filled 2021-05-13: qty 5

## 2021-05-13 MED ORDER — MILRINONE LACTATE IN DEXTROSE 20-5 MG/100ML-% IV SOLN: 0.3000 ug/kg/min | INTRAVENOUS | Status: DC

## 2021-05-13 MED ORDER — LACTATED RINGERS IV SOLN: INTRAVENOUS | Status: AC

## 2021-05-13 MED ORDER — DOCUSATE SODIUM 100 MG PO CAPS
200.0000 mg | ORAL_CAPSULE | Freq: Every day | ORAL | Status: DC
  Administered 2021-05-14 – 2021-05-15 (×2): 200 mg via ORAL
  Filled 2021-05-13 (×2): qty 2

## 2021-05-13 MED ORDER — ROCURONIUM BROMIDE 10 MG/ML (PF) SYRINGE
PREFILLED_SYRINGE | INTRAVENOUS | Status: AC
  Filled 2021-05-13: qty 10

## 2021-05-13 MED ORDER — NICARDIPINE HCL IN NACL 20-0.86 MG/200ML-% IV SOLN: 3.0000 mg/h | INTRAVENOUS | Status: DC

## 2021-05-13 MED ORDER — PROTAMINE SULFATE 10 MG/ML IV SOLN
INTRAVENOUS | Status: DC | PRN
Start: 2021-05-13 — End: 2021-05-13
  Administered 2021-05-13: 270 mg via INTRAVENOUS

## 2021-05-13 MED ORDER — POTASSIUM CHLORIDE 10 MEQ/50ML IV SOLN: 10.0000 meq | INTRAVENOUS | Status: AC

## 2021-05-13 MED ORDER — MIDAZOLAM HCL (PF) 10 MG/2ML IJ SOLN
INTRAMUSCULAR | Status: AC
  Filled 2021-05-13: qty 2

## 2021-05-13 MED ORDER — SODIUM CHLORIDE 0.9% FLUSH
10.0000 mL | Freq: Two times a day (BID) | INTRAVENOUS | Status: DC
  Administered 2021-05-13 – 2021-05-15 (×4): 10 mL

## 2021-05-13 MED ORDER — INSULIN REGULAR(HUMAN) IN NACL 100-0.9 UT/100ML-% IV SOLN: INTRAVENOUS | Status: DC

## 2021-05-13 MED ORDER — SODIUM CHLORIDE 0.45 % IV SOLN: INTRAVENOUS | Status: DC | PRN

## 2021-05-13 MED ORDER — HEPARIN SODIUM (PORCINE) 1000 UNIT/ML IJ SOLN
INTRAMUSCULAR | Status: DC | PRN
Start: 2021-05-13 — End: 2021-05-13
  Administered 2021-05-13: 27000 [IU] via INTRAVENOUS

## 2021-05-13 MED ORDER — BISACODYL 5 MG PO TBEC
10.0000 mg | DELAYED_RELEASE_TABLET | Freq: Every day | ORAL | Status: DC
  Administered 2021-05-14 – 2021-05-15 (×2): 10 mg via ORAL
  Filled 2021-05-13 (×2): qty 2

## 2021-05-13 MED ORDER — PROPOFOL 10 MG/ML IV BOLUS
INTRAVENOUS | Status: DC | PRN
  Administered 2021-05-13: 120 mg via INTRAVENOUS

## 2021-05-13 MED ORDER — FENTANYL CITRATE (PF) 250 MCG/5ML IJ SOLN
INTRAMUSCULAR | Status: DC | PRN
  Administered 2021-05-13: 100 ug via INTRAVENOUS
  Administered 2021-05-13 (×3): 50 ug via INTRAVENOUS
  Administered 2021-05-13: 200 ug via INTRAVENOUS
  Administered 2021-05-13: 100 ug via INTRAVENOUS
  Administered 2021-05-13: 50 ug via INTRAVENOUS
  Administered 2021-05-13: 100 ug via INTRAVENOUS
  Administered 2021-05-13 (×2): 50 ug via INTRAVENOUS

## 2021-05-13 MED ORDER — ROCURONIUM BROMIDE 10 MG/ML (PF) SYRINGE
PREFILLED_SYRINGE | INTRAVENOUS | Status: DC | PRN
  Administered 2021-05-13 (×2): 100 mg via INTRAVENOUS

## 2021-05-13 MED ORDER — SODIUM CHLORIDE 0.9 % IV SOLN: INTRAVENOUS | Status: DC

## 2021-05-13 MED ORDER — VASOPRESSIN 20 UNIT/ML IV SOLN
INTRAVENOUS | Status: AC
  Filled 2021-05-13: qty 1

## 2021-05-13 MED ORDER — SODIUM CHLORIDE 0.9% FLUSH: 3.0000 mL | INTRAVENOUS | Status: DC | PRN

## 2021-05-13 MED ORDER — SODIUM CHLORIDE (PF) 0.9 % IJ SOLN
OROMUCOSAL | Status: DC | PRN
  Administered 2021-05-13 (×2): 4 mL via TOPICAL

## 2021-05-13 MED ORDER — PANTOPRAZOLE SODIUM 40 MG PO TBEC
40.0000 mg | DELAYED_RELEASE_TABLET | Freq: Every day | ORAL | Status: DC
  Administered 2021-05-15 – 2021-05-17 (×3): 40 mg via ORAL
  Filled 2021-05-13 (×3): qty 1

## 2021-05-13 MED ORDER — ACETAMINOPHEN 650 MG RE SUPP
650.0000 mg | Freq: Once | RECTAL | Status: AC
  Administered 2021-05-13: 650 mg via RECTAL

## 2021-05-13 MED ORDER — EPHEDRINE SULFATE-NACL 50-0.9 MG/10ML-% IV SOSY
PREFILLED_SYRINGE | INTRAVENOUS | Status: DC | PRN
  Administered 2021-05-13: 5 mg via INTRAVENOUS

## 2021-05-13 MED ORDER — LIDOCAINE 2% (20 MG/ML) 5 ML SYRINGE
INTRAMUSCULAR | Status: AC
  Filled 2021-05-13: qty 5

## 2021-05-13 MED ORDER — VANCOMYCIN HCL IN DEXTROSE 1-5 GM/200ML-% IV SOLN
1000.0000 mg | Freq: Once | INTRAVENOUS | Status: AC
  Administered 2021-05-13: 1000 mg via INTRAVENOUS
  Filled 2021-05-13: qty 200

## 2021-05-13 MED ORDER — CHLORHEXIDINE GLUCONATE CLOTH 2 % EX PADS
6.0000 | MEDICATED_PAD | Freq: Every day | CUTANEOUS | Status: DC
  Administered 2021-05-13 – 2021-05-15 (×3): 6 via TOPICAL

## 2021-05-13 MED ORDER — METOPROLOL TARTRATE 12.5 MG HALF TABLET
12.5000 mg | ORAL_TABLET | Freq: Two times a day (BID) | ORAL | Status: DC
  Administered 2021-05-14: 12.5 mg via ORAL
  Filled 2021-05-13: qty 1

## 2021-05-13 MED ORDER — VASOPRESSIN 20 UNIT/ML IV SOLN
INTRAVENOUS | Status: DC | PRN
  Administered 2021-05-13: 1 [IU] via INTRAVENOUS

## 2021-05-13 MED ORDER — ACETAMINOPHEN 160 MG/5ML PO SOLN: 1000.0000 mg | Freq: Four times a day (QID) | ORAL | Status: DC

## 2021-05-13 MED ORDER — PHENYLEPHRINE HCL-NACL 20-0.9 MG/250ML-% IV SOLN: 0.0000 ug/min | INTRAVENOUS | Status: DC

## 2021-05-13 MED ORDER — SODIUM CHLORIDE (PF) 0.9 % IJ SOLN
INTRAMUSCULAR | Status: AC
  Filled 2021-05-13: qty 10

## 2021-05-13 MED ORDER — EPHEDRINE 5 MG/ML INJ
INTRAVENOUS | Status: AC
  Filled 2021-05-13: qty 5

## 2021-05-13 MED ORDER — SODIUM CHLORIDE 0.9% FLUSH
3.0000 mL | Freq: Two times a day (BID) | INTRAVENOUS | Status: DC
  Administered 2021-05-14 – 2021-05-16 (×4): 3 mL via INTRAVENOUS

## 2021-05-13 MED ORDER — MAGNESIUM SULFATE 4 GM/100ML IV SOLN
4.0000 g | Freq: Once | INTRAVENOUS | Status: AC
  Administered 2021-05-13: 4 g via INTRAVENOUS
  Filled 2021-05-13: qty 100

## 2021-05-13 MED ORDER — ACETAMINOPHEN 160 MG/5ML PO SOLN: 650.0000 mg | Freq: Once | ORAL | Status: AC

## 2021-05-13 MED ORDER — MORPHINE SULFATE (PF) 2 MG/ML IV SOLN
1.0000 mg | INTRAVENOUS | Status: DC | PRN
  Administered 2021-05-13 – 2021-05-14 (×9): 2 mg via INTRAVENOUS
  Filled 2021-05-13 (×9): qty 1

## 2021-05-13 MED ORDER — PLASMA-LYTE A IV SOLN
INTRAVENOUS | Status: DC | PRN
  Administered 2021-05-13: 1000 mL

## 2021-05-13 MED ORDER — ASPIRIN EC 325 MG PO TBEC
325.0000 mg | DELAYED_RELEASE_TABLET | Freq: Every day | ORAL | Status: DC
  Administered 2021-05-14 – 2021-05-17 (×4): 325 mg via ORAL
  Filled 2021-05-13 (×4): qty 1

## 2021-05-13 MED ORDER — METOPROLOL TARTRATE 25 MG/10 ML ORAL SUSPENSION: 12.5000 mg | Freq: Two times a day (BID) | ORAL | Status: DC

## 2021-05-13 MED ORDER — ORAL CARE MOUTH RINSE
15.0000 mL | OROMUCOSAL | Status: DC
  Administered 2021-05-13: 15 mL via OROMUCOSAL

## 2021-05-13 SURGICAL SUPPLY — 114 items
ADH SKN CLS APL DERMABOND .7 (GAUZE/BANDAGES/DRESSINGS) ×4
BAG DECANTER FOR FLEXI CONT (MISCELLANEOUS) ×5 IMPLANT
BLADE CLIPPER SURG (BLADE) ×5 IMPLANT
BLADE STERNUM SYSTEM 6 (BLADE) ×5 IMPLANT
BLADE SURG 11 STRL SS (BLADE) ×1 IMPLANT
BNDG ELASTIC 4X5.8 VLCR STR LF (GAUZE/BANDAGES/DRESSINGS) ×5 IMPLANT
BNDG ELASTIC 6X5.8 VLCR STR LF (GAUZE/BANDAGES/DRESSINGS) ×5 IMPLANT
BNDG GAUZE ELAST 4 BULKY (GAUZE/BANDAGES/DRESSINGS) ×5 IMPLANT
CABLE SURGICAL S-101-97-12 (CABLE) ×5 IMPLANT
CANISTER SUCT 3000ML PPV (MISCELLANEOUS) ×5 IMPLANT
CANNULA MC2 2 STG 29/37 NON-V (CANNULA) ×4 IMPLANT
CANNULA MC2 TWO STAGE (CANNULA) ×5
CANNULA NON VENT 20FR 12 (CANNULA) ×5 IMPLANT
CATH ROBINSON RED A/P 18FR (CATHETERS) ×10 IMPLANT
CLIP RETRACTION 3.0MM CORONARY (MISCELLANEOUS) ×4 IMPLANT
CLIP TI MEDIUM 24 (CLIP) ×1 IMPLANT
CLIP VESOCCLUDE MED 24/CT (CLIP) IMPLANT
CLIP VESOCCLUDE SM WIDE 24/CT (CLIP) IMPLANT
CNTNR URN SCR LID CUP LEK RST (MISCELLANEOUS) IMPLANT
CONN ST 1/2X1/2  BEN (MISCELLANEOUS) ×5
CONN ST 1/2X1/2 BEN (MISCELLANEOUS) ×4 IMPLANT
CONNECTOR BLAKE 2:1 CARIO BLK (MISCELLANEOUS) ×5 IMPLANT
CONT SPEC 4OZ STRL OR WHT (MISCELLANEOUS) ×10
CONTAINER PROTECT SURGISLUSH (MISCELLANEOUS) ×10 IMPLANT
DERMABOND ADVANCED (GAUZE/BANDAGES/DRESSINGS) ×1
DERMABOND ADVANCED .7 DNX12 (GAUZE/BANDAGES/DRESSINGS) IMPLANT
DRAIN CHANNEL 19F RND (DRAIN) ×15 IMPLANT
DRAIN CONNECTOR BLAKE 1:1 (MISCELLANEOUS) ×5 IMPLANT
DRAPE CARDIOVASCULAR INCISE (DRAPES) ×5
DRAPE INCISE IOBAN 66X45 STRL (DRAPES) IMPLANT
DRAPE SRG 135X102X78XABS (DRAPES) ×4 IMPLANT
DRAPE WARM FLUID 44X44 (DRAPES) ×5 IMPLANT
DRESSING AQUACEL AG SP 3.5X10 (GAUZE/BANDAGES/DRESSINGS) IMPLANT
DRSG AQUACEL AG ADV 3.5X10 (GAUZE/BANDAGES/DRESSINGS) ×5 IMPLANT
DRSG AQUACEL AG SP 3.5X10 (GAUZE/BANDAGES/DRESSINGS) ×5
DRSG COVADERM 4X14 (GAUZE/BANDAGES/DRESSINGS) ×5 IMPLANT
ELECT BLADE 4.0 EZ CLEAN MEGAD (MISCELLANEOUS) ×5
ELECT REM PT RETURN 9FT ADLT (ELECTROSURGICAL) ×10
ELECTRODE BLDE 4.0 EZ CLN MEGD (MISCELLANEOUS) ×4 IMPLANT
ELECTRODE REM PT RTRN 9FT ADLT (ELECTROSURGICAL) ×8 IMPLANT
FELT TEFLON 1X6 (MISCELLANEOUS) ×9 IMPLANT
GAUZE 4X4 16PLY ~~LOC~~+RFID DBL (SPONGE) ×5 IMPLANT
GAUZE SPONGE 4X4 12PLY STRL (GAUZE/BANDAGES/DRESSINGS) ×10 IMPLANT
GAUZE SPONGE 4X4 12PLY STRL LF (GAUZE/BANDAGES/DRESSINGS) ×2 IMPLANT
GLOVE SURG ENC MOIS LTX SZ6 (GLOVE) ×1 IMPLANT
GLOVE SURG ENC MOIS LTX SZ7 (GLOVE) ×10 IMPLANT
GLOVE SURG ENC TEXT LTX SZ7.5 (GLOVE) ×10 IMPLANT
GLOVE SURG MICRO LTX SZ6 (GLOVE) ×4 IMPLANT
GLOVE SURG MICRO LTX SZ6.5 (GLOVE) ×2 IMPLANT
GLOVE SURG UNDER POLY LF SZ6.5 (GLOVE) ×4 IMPLANT
GLOVE SURG UNDER POLY LF SZ9 (GLOVE) ×1 IMPLANT
GOWN STRL REUS W/ TWL LRG LVL3 (GOWN DISPOSABLE) ×16 IMPLANT
GOWN STRL REUS W/ TWL XL LVL3 (GOWN DISPOSABLE) ×8 IMPLANT
GOWN STRL REUS W/TWL LRG LVL3 (GOWN DISPOSABLE) ×50
GOWN STRL REUS W/TWL XL LVL3 (GOWN DISPOSABLE) ×15
HANDLE STAPLE ENDO GIA SHORT (STAPLE) ×1
HEMOSTAT POWDER SURGIFOAM 1G (HEMOSTASIS) ×14 IMPLANT
INSERT SUTURE HOLDER (MISCELLANEOUS) ×5 IMPLANT
KIT BASIN OR (CUSTOM PROCEDURE TRAY) ×5 IMPLANT
KIT SUCTION CATH 14FR (SUCTIONS) ×5 IMPLANT
KIT TURNOVER KIT B (KITS) ×5 IMPLANT
KIT VASOVIEW HEMOPRO 2 VH 4000 (KITS) ×5 IMPLANT
LEAD PACING MYOCARDI (MISCELLANEOUS) ×6 IMPLANT
MARKER GRAFT CORONARY BYPASS (MISCELLANEOUS) ×15 IMPLANT
NDL BIOPSY 14X6 SOFT TISS (NEEDLE) IMPLANT
NEEDLE BIOPSY 14X6 SOFT TISS (NEEDLE) ×5 IMPLANT
NS IRRIG 1000ML POUR BTL (IV SOLUTION) ×25 IMPLANT
PACK ACCESSORY CANNULA KIT (KITS) ×5 IMPLANT
PACK E OPEN HEART (SUTURE) ×5 IMPLANT
PACK OPEN HEART (CUSTOM PROCEDURE TRAY) ×5 IMPLANT
PAD ARMBOARD 7.5X6 YLW CONV (MISCELLANEOUS) ×10 IMPLANT
PAD ELECT DEFIB RADIOL ZOLL (MISCELLANEOUS) ×5 IMPLANT
PENCIL BUTTON HOLSTER BLD 10FT (ELECTRODE) ×5 IMPLANT
POSITIONER HEAD DONUT 9IN (MISCELLANEOUS) ×5 IMPLANT
PUNCH AORTIC ROTATE 4.0MM (MISCELLANEOUS) ×5 IMPLANT
RELOAD EGIA 45 MED/THCK PURPLE (STAPLE) ×1 IMPLANT
RELOAD EGIA BLACK ROTIC 45MM (STAPLE) ×20 IMPLANT
RELOAD STAPLE 45 BLK XTHK (STAPLE) IMPLANT
RELOAD STAPLE 60 BLK XTHK ART (STAPLE) IMPLANT
RELOAD TRI 2.0 60 XTHK VAS SUL (STAPLE) ×20 IMPLANT
SET MPS 3-ND DEL (MISCELLANEOUS) ×1 IMPLANT
SOL ANTI FOG 6CC (MISCELLANEOUS) IMPLANT
SOL PREP POV-IOD 4OZ 10% (MISCELLANEOUS) ×1 IMPLANT
SOL SCRUB PVP POV-IOD 4OZ 7.5% (MISCELLANEOUS) ×5
SOLUTION ANTI FOG 6CC (MISCELLANEOUS) ×1
SOLUTION SCRB POV-IOD 4OZ 7.5% (MISCELLANEOUS) IMPLANT
SPONGE T-LAP 18X18 ~~LOC~~+RFID (SPONGE) ×20 IMPLANT
STAPLER ENDO GIA 12 SHRT THIN (STAPLE) IMPLANT
STAPLER ENDO GIA 12MM SHORT (STAPLE) ×4 IMPLANT
SUPPORT HEART JANKE-BARRON (MISCELLANEOUS) ×5 IMPLANT
SUT BONE WAX W31G (SUTURE) ×5 IMPLANT
SUT ETHIBOND X763 2 0 SH 1 (SUTURE) ×10 IMPLANT
SUT ETHILON 3 0 PS 1 (SUTURE) ×1 IMPLANT
SUT MNCRL AB 3-0 PS2 18 (SUTURE) ×10 IMPLANT
SUT MNCRL AB 4-0 PS2 18 (SUTURE) ×1 IMPLANT
SUT PDS AB 1 CTX 36 (SUTURE) ×10 IMPLANT
SUT PROLENE 4 0 RB 1 (SUTURE) ×10
SUT PROLENE 4 0 SH DA (SUTURE) ×5 IMPLANT
SUT PROLENE 4-0 RB1 .5 CRCL 36 (SUTURE) IMPLANT
SUT PROLENE 5 0 C 1 36 (SUTURE) ×15 IMPLANT
SUT PROLENE 7 0 BV1 MDA (SUTURE) ×6 IMPLANT
SUT STEEL 6MS V (SUTURE) ×10 IMPLANT
SUT VIC AB 2-0 CT1 27 (SUTURE) ×5
SUT VIC AB 2-0 CT1 TAPERPNT 27 (SUTURE) IMPLANT
SYSTEM SAHARA CHEST DRAIN ATS (WOUND CARE) ×5 IMPLANT
TAPE CLOTH SURG 4X10 WHT LF (GAUZE/BANDAGES/DRESSINGS) ×2 IMPLANT
TAPE PAPER 2X10 WHT MICROPORE (GAUZE/BANDAGES/DRESSINGS) ×1 IMPLANT
TOWEL GREEN STERILE (TOWEL DISPOSABLE) ×5 IMPLANT
TOWEL GREEN STERILE FF (TOWEL DISPOSABLE) ×5 IMPLANT
TRAY FOLEY SLVR 16FR TEMP STAT (SET/KITS/TRAYS/PACK) ×5 IMPLANT
TUBE SUCTION CARDIAC 10FR (CANNULA) ×1 IMPLANT
TUBING LAP HI FLOW INSUFFLATIO (TUBING) ×5 IMPLANT
UNDERPAD 30X36 HEAVY ABSORB (UNDERPADS AND DIAPERS) ×5 IMPLANT
WATER STERILE IRR 1000ML POUR (IV SOLUTION) ×10 IMPLANT

## 2021-05-13 NOTE — Anesthesia Postprocedure Evaluation (Signed)
Anesthesia Post Note  Patient: Chartered loss adjuster  Procedure(s) Performed: CORONARY ARTERY BYPASS GRAFTING (CABG) TIMES 3  , ON PUMP, USING LEFT INTERNAL MAMMARY ARTERY AND RIGHT GREATER SAPHENOUS VEIN HARVESTED ENDOSCOPICALLY (Chest) TRANSESOPHAGEAL ECHOCARDIOGRAM (TEE) APPLICATION OF CELL SAVER ENDOVEIN HARVEST OF GREATER SAPHENOUS VEIN (Right) RIGHT LUNG BIOPSY (Right)     Patient location during evaluation: SICU Anesthesia Type: General Level of consciousness: sedated Pain management: pain level controlled Vital Signs Assessment: post-procedure vital signs reviewed and stable Respiratory status: patient remains intubated per anesthesia plan Cardiovascular status: stable Postop Assessment: no apparent nausea or vomiting Anesthetic complications: no   No notable events documented.  Last Vitals:  Vitals:   05/13/21 1445 05/13/21 1500  BP: (!) 92/56 (!) 146/82  Pulse: 71 76  Resp: 15 20  Temp: 37.3 C 37.3 C  SpO2: 96% 98%    Last Pain:  Vitals:   05/13/21 1300  TempSrc: Bladder  PainSc:                  Effie Berkshire

## 2021-05-13 NOTE — Anesthesia Preprocedure Evaluation (Addendum)
Anesthesia Evaluation  Patient identified by MRN, date of birth, ID band Patient awake    Reviewed: Allergy & Precautions, NPO status , Patient's Chart, lab work & pertinent test results  Airway Mallampati: II  TM Distance: >3 FB Neck ROM: Full    Dental  (+) Teeth Intact, Dental Advisory Given   Pulmonary former smoker,    breath sounds clear to auscultation       Cardiovascular + CAD   Rhythm:Regular Rate:Normal  Echo: 1. Left ventricular ejection fraction, by estimation, is 40 to 45%. The  left ventricle has mildly decreased function. The left ventricle  demonstrates global hypokinesis. Left ventricular diastolic parameters  were normal.  2. Right ventricular systolic function is normal. The right ventricular  size is normal.  3. The mitral valve is normal in structure. Trivial mitral valve  regurgitation. No evidence of mitral stenosis.  4. The aortic valve is normal in structure. Aortic valve regurgitation is  not visualized. No aortic stenosis is present.    Neuro/Psych negative neurological ROS  negative psych ROS   GI/Hepatic negative GI ROS, Neg liver ROS,   Endo/Other  negative endocrine ROS  Renal/GU negative Renal ROS     Musculoskeletal negative musculoskeletal ROS (+)   Abdominal Normal abdominal exam  (+)   Peds  Hematology negative hematology ROS (+)   Anesthesia Other Findings   Reproductive/Obstetrics                            Anesthesia Physical Anesthesia Plan  ASA: 4  Anesthesia Plan: General   Post-op Pain Management:    Induction: Intravenous  PONV Risk Score and Plan: 3 and Ondansetron  Airway Management Planned: Oral ETT  Additional Equipment: Arterial line, CVP, TEE and Ultrasound Guidance Line Placement  Intra-op Plan:   Post-operative Plan: Post-operative intubation/ventilation  Informed Consent: I have reviewed the patients History and  Physical, chart, labs and discussed the procedure including the risks, benefits and alternatives for the proposed anesthesia with the patient or authorized representative who has indicated his/her understanding and acceptance.     Dental advisory given  Plan Discussed with: CRNA  Anesthesia Plan Comments:        Anesthesia Quick Evaluation

## 2021-05-13 NOTE — Progress Notes (Signed)
°   °  [address]       [place] [address]             [phone]       No event  Vitals:   05/12/21 2140 05/13/21 0451  BP:  (!) 118/53  Pulse: 60   Resp:  17  Temp:  (!) 97.5 F (36.4 C)  SpO2:  98%   Alert NAD Sinus  EWOB   OR today for CABG 3 and Right lung biopsy  Kevin [name] [name]

## 2021-05-13 NOTE — Consult Note (Signed)
Kevin [name], MRN:  [id number], DOB:  19-Sep-1940, LOS: 7 ADMISSION DATE:  05/06/2021, CONSULTATION DATE:  05/13/21 REFERRING MD:  Kipp Brood, CHIEF COMPLAINT:   post-op management  History of Present Illness:  Kevin [name] is a 81 y.o. M with PMH of HL and CAD withrecent chest pain who has recently had 3-4 weeks of exertional dyspnea and presented to cardiology where he was found to have inferolateral ST depressions on EKG.  He presented for to the OR today for CABGx3, LIMA to LAD, SVG to Left PDA SVG to OM.  The procedure was uncomplicated and was transferred to intensive care post-op on Neosynephrine and mechanically ventilated.  Also had recent CT chest 1/25 with RUL spiculated lesions.  RUL bx also completed today  Pertinent  Medical History   has a past medical history of Hyperlipidemia.   Significant Hospital Events: Including procedures, antibiotic start and stop dates in addition to other pertinent events   1/31 Admitted to Cardiothoracic Surgery for CABG x3 and RUL bx, transferred to ICU  Interim History / Subjective:  Pt arrived to the ICU intubated and sedated on Precedex, TXA, Nitroglycerin and phenylephrine,  BP improving.   Objective   Blood pressure (!) 125/57, pulse (!) 49, temperature (!) 97.5 F (36.4 C), temperature source Oral, resp. rate (!) 9, height 5\' 8"  (1.727 m), weight 80.9 kg, SpO2 99 %.        Intake/Output Summary (Last 24 hours) at 05/13/2021 1244 Last data filed at 05/13/2021 1208 Gross per 24 hour  Intake 2045 ml  Output 1565 ml  Net 480 ml   Filed Weights   05/11/21 0330 05/12/21 0340 05/13/21 0154  Weight: 81.2 kg 80.9 kg 80.9 kg    General:  pale, elderly M, intubated and sedated HEENT: MM pink/moist, ETT in place Neuro: sedated on precedex, not responsive  CV: s1s2 rrr, no m/r/g PULM:  mechanically ventilated, minimal vent setting Extremities: warm/dry, no edema Skin: no rashes or lesions, skin mottling   Resolved Hospital  Problem list     Assessment & Plan:    Unstable Angina and CAD with CABGx3 Expected post-op ventilator dependence LIMA to LAD, SVG to Left PDA, SVG to OM -post-op management per cardiothoracic surgery -continue full vent support with rapid wean protocol as appropriate -albumin and wean pressors as able --titrate Vent setting to maintain SpO2 greater than or equal to 90%. -HOB elevated 30 degrees. -Plateau pressures less than 30 cm H20.  -Follow chest x-ray, ABG prn.   -Bronchial hygiene and RT/bronchodilator protocol. -PCCM will follow along and is available for any concerns      Best Practice (right click and "Reselect all SmartList Selections" daily)   Diet/type: NPO DVT prophylaxis: SCD GI prophylaxis: PPI Lines: Central line Foley:  Yes, and it is still needed Code Status:  full code Last date of multidisciplinary goals of care discussion [per primary]  Labs   CBC: Recent Labs  Lab 05/06/21 1939 05/11/21 1307 05/13/21 0501 05/13/21 0756 05/13/21 1022 05/13/21 1046 05/13/21 1100 05/13/21 1113 05/13/21 1147 05/13/21 1151  WBC 6.6 4.9 6.1  --   --   --   --   --   --   --   HGB 12.9* 13.4 13.6   < > 9.3* 9.5* 9.2* 8.8* 8.8* 9.9*  HCT 36.8* 38.7* 39.5   < > 26.2* 28.0* 27.0* 26.0* 26.0* 29.0*  MCV 92.2 92.8 92.7  --   --   --   --   --   --   --  PLT 191 204 217  --  167  --   --   --   --   --    < > = values in this interval not displayed.    Basic Metabolic Panel: Recent Labs  Lab 05/11/21 1307 05/13/21 0501 05/13/21 0756 05/13/21 0759 05/13/21 0920 05/13/21 0947 05/13/21 1013 05/13/21 1016 05/13/21 1046 05/13/21 1100 05/13/21 1113 05/13/21 1147 05/13/21 1151  NA 136 136 137   < > 136   < > 134*   < > 131* 132* 133* 134* 134*  K 4.4 4.4 4.6   < > 5.1   < > 6.4*   < > 7.0* 6.7* 6.4* 5.9* 5.9*  CL 104 105 104  --  102  --  98  --   --   --  101 103  --   CO2 26 23  --   --   --   --   --   --   --   --   --   --   --   GLUCOSE 109* 95 99   --  105*  --  102*  --   --   --  116* 128*  --   BUN 15 20 18   --  20  --  19  --   --   --  19 18  --   CREATININE 1.57* 1.57* 1.40*  --  1.30*  --  1.20  --   --   --  1.20 1.20  --   CALCIUM 8.8* 9.3  --   --   --   --   --   --   --   --   --   --   --    < > = values in this interval not displayed.   GFR: Estimated Creatinine Clearance: 49.1 mL/min (by C-G formula based on SCr of 1.2 mg/dL). Recent Labs  Lab 05/06/21 1939 05/11/21 1307 05/13/21 0501  WBC 6.6 4.9 6.1    Liver Function Tests: Recent Labs  Lab 05/11/21 1307  AST 15  ALT 16  ALKPHOS 63  BILITOT 1.1  PROT 6.5  ALBUMIN 3.6   No results for input(s): LIPASE, AMYLASE in the last 168 hours. No results for input(s): AMMONIA in the last 168 hours.  ABG    Component Value Date/Time   PHART 7.311 (L) 05/13/2021 1151   PCO2ART 45.4 05/13/2021 1151   PO2ART 89 05/13/2021 1151   HCO3 22.9 05/13/2021 1151   TCO2 24 05/13/2021 1151   ACIDBASEDEF 3.0 (H) 05/13/2021 1151   O2SAT 96.0 05/13/2021 1151     Coagulation Profile: Recent Labs  Lab 05/13/21 0501  INR 1.0    Cardiac Enzymes: No results for input(s): CKTOTAL, CKMB, CKMBINDEX, TROPONINI in the last 168 hours.  HbA1C: Hgb A1c MFr Bld  Date/Time Value Ref Range Status  05/13/2021 05:01 AM 5.2 4.8 - 5.6 % Final    Comment:    (NOTE) Pre diabetes:          5.7%-6.4%  Diabetes:              >6.4%  Glycemic control for   <7.0% adults with diabetes     CBG: No results for input(s): GLUCAP in the last 168 hours.  Review of Systems:   Unable to obtain secondary to intubation  Past Medical History:  He,  has a past medical history of Hyperlipidemia.   Surgical History:   Past Surgical History:  Procedure Laterality Date   epidural     back pain   IR CT SPINE LTD  06/18/2020   IR KYPHO LUMBAR INC FX REDUCE BONE BX UNI/BIL CANNULATION INC/IMAGING  06/18/2020   LEFT HEART CATH AND CORONARY ANGIOGRAPHY N/A 05/06/2021   Procedure: LEFT HEART  CATH AND CORONARY ANGIOGRAPHY;  Surgeon: [name], MD;  Location: [facility];  Service: Cardiovascular;  Laterality: N/A;   MINOR HEMORRHOIDECTOMY     SHOULDER SURGERY     TRANSURETHRAL RESECTION OF BLADDER TUMOR  2014   TRANSURETHRAL RESECTION OF BLADDER TUMOR N/A 05/19/2018   Procedure: TRANSURETHRAL RESECTION OF BLADDER TUMOR (TURBT) AND (INTRAVESICAL GEMCITABINE INSTILLED IN BLADDER IN PACU) ;  Surgeon: Franchot Gallo, MD;  Location: [facility];  Service: Urology;  Laterality: N/A;  30 MINS     Social History:   reports that he quit smoking about 26 years ago. His smoking use included cigarettes. He has a 30.00 pack-year smoking history. He has never used smokeless tobacco. He reports that he does not drink alcohol and does not use drugs.   Family History:  His family history includes Alzheimer's disease (age of onset: 68) in his mother; Congestive Heart Failure (age of onset: 35) in his father; Heart disease (age of onset: 4) in his brother; Heart failure (age of onset: 20) in his brother; Heart failure (age of onset: 27) in his brother.   Allergies No Known Allergies   Home Medications  Prior to Admission medications   Medication Sig Start Date End Date Taking? Authorizing Provider  aspirin EC 81 MG tablet Take 81 mg by mouth daily.   Yes [provider]  atorvastatin (LIPITOR) 80 MG tablet Take 1 tablet (80 mg total) by mouth daily. 05/01/21  Yes [name], MD  isosorbide mononitrate (IMDUR) 60 MG 24 hr tablet Take 0.5 tablets (30 mg total) by mouth daily. 05/01/21 07/30/21 Yes [name], MD  metoprolol tartrate (LOPRESSOR) 25 MG tablet Take 1 tablet (25 mg total) by mouth 2 (two) times daily. 05/01/21 07/30/21 Yes [name], MD  nitroGLYCERIN (NITROSTAT) 0.4 MG SL tablet Place 0.4 mg under the tongue every 5 (five) minutes as needed for chest pain. 04/09/21  Yes [provider]     Critical care time:  35 minutes     CRITICAL CARE Performed by: Otilio Carpen  Lacy Taglieri   Total critical care time: 35 minutes  Critical care time was exclusive of separately billable procedures and treating other patients.  Critical care was necessary to treat or prevent imminent or life-threatening deterioration.  Critical care was time spent personally by me on the following activities: development of treatment plan with patient and/or surrogate as well as nursing, discussions with consultants, evaluation of patient's response to treatment, examination of patient, obtaining history from patient or surrogate, ordering and performing treatments and interventions, ordering and review of laboratory studies, ordering and review of radiographic studies, pulse oximetry and re-evaluation of patient's condition.  Otilio Carpen Jeymi Hepp, PA-C [facility] See Amion for pager If no response to pager , please call [phone] until 7pm After 7:00 pm call Elink  [phone]

## 2021-05-13 NOTE — Hospital Course (Addendum)
History of Present Illness:  Kevin [name] is a 81 yo male who was referred to Dr. Einar Gip with complaints of chest pain and abnormal EKG.  He has known history of HLD.  His symptoms had been occurring for about a month with minimal exertional activity.  EKG was obtained and showed ST segment depression in the inferolateral leads.  The patient denied associated shortness of breath, nausea, and vomiting.  His medication regimen was adjusted and he was set up for cardiac catheterization.  This was performed on 05/06/2021 and showed Left main disease.  It was felt the patient would require coronary bypass grafting and he was admitted to the hospital for further care.     Hospital Course:  The patient was evaluated by Dr. Kipp Brood who felt patient would benefit from coronary artery bypass grafting.  The risks and benefits of the procedure were explained to the patient and was agreeable to proceed.  CXR obtained as part of preoperative workup showed evidence of a right upper lung mass.  CT of the chest was obtained and this area was concerning for primary lung cancer.  It was felt we could still proceed with surgery and lung biopsy for diagnosis could be performed at the time of coronary bypass.   The patient was taken to the operating room and underwent CABG x 3 utilizing LIMA to LAD, SVG to OM, and SVG to left PDA.  He also underwent endoscopic harvest of greater saphenous vein from his right leg.  He also underwent wedge resection of the right upper lobe mass.  He tolerated the procedure without difficulty and was taken to the SICU in stable condition.  The patient was extubated the evening of surgery.  During his stay in the SICU he did not require inotropic support.  He was in Sinus Tachycardia and beta blocker therapy was initiated.  He underwent went removal of arterial line and swan ganz catheter on POD #1. The patient's chest tubes were removed without difficulty.  The patient was started on diuretics for  volume overloaded state.  He was weaned off the Insulin drip. His pre op HGA1C was 5.2. Accu checks and SS PRN were stopped. He was maintaining NSR and transferred to the progressive floor unit on 05/15/2021.  He presented with ACS and DAPT therapy will be initiated prior to discharge. He had expected post op blood loss anemia. He did not require a post op transfusion. He is ambulating on room air with good oxygenation. All wounds are clean, dry, and healing without signs of infection. He is tolerating a diet and has had a bowel movement. Will start Plavix at discharge. As discussed with Dr. Kipp Brood, he is felt surgically stable for discharge today.

## 2021-05-13 NOTE — Progress Notes (Signed)
°  Echocardiogram Echocardiogram Transesophageal has been performed.  Fidel Levy 05/13/2021, 8:34 AM

## 2021-05-13 NOTE — Anesthesia Procedure Notes (Signed)
Central Venous Catheter Insertion Performed by: Effie Berkshire, MD, anesthesiologist Start/End1/31/2023 7:00 AM, 05/13/2021 7:10 AM Patient location: Pre-op. Preanesthetic checklist: patient identified, IV checked, site marked, risks and benefits discussed, surgical consent, monitors and equipment checked, pre-op evaluation, timeout performed and anesthesia consent Position: Trendelenburg Lidocaine 1% used for infiltration and patient sedated Hand hygiene performed , maximum sterile barriers used  and Seldinger technique used Catheter size: 8.5 Fr Total catheter length 10. Central line was placed.Sheath introducer Swan type:thermodilution PA Cath depth:50 Procedure performed using ultrasound guided technique. Ultrasound Notes:anatomy identified, needle tip was noted to be adjacent to the nerve/plexus identified, no ultrasound evidence of intravascular and/or intraneural injection and image(s) printed for medical record Attempts: 1 Following insertion, line sutured and dressing applied. Post procedure assessment: blood return through all ports, free fluid flow and no air  Patient tolerated the procedure well with no immediate complications.

## 2021-05-13 NOTE — Transfer of Care (Signed)
Immediate Anesthesia Transfer of Care Note  Patient: Kevin [name]  Procedure(s) Performed: CORONARY ARTERY BYPASS GRAFTING (CABG) TIMES 3  , ON PUMP, USING LEFT INTERNAL MAMMARY ARTERY AND RIGHT GREATER SAPHENOUS VEIN HARVESTED ENDOSCOPICALLY (Chest) TRANSESOPHAGEAL ECHOCARDIOGRAM (TEE) APPLICATION OF CELL SAVER ENDOVEIN HARVEST OF GREATER SAPHENOUS VEIN (Right) RIGHT LUNG BIOPSY (Right)  Patient Location: SICU  Anesthesia Type:General  Level of Consciousness: Patient remains intubated per anesthesia plan  Airway & Oxygen Therapy: Patient remains intubated per anesthesia plan and Patient placed on Ventilator (see vital sign flow sheet for setting)  Post-op Assessment: Report given to RN and Post -op Vital signs reviewed and stable  Post vital signs: Reviewed and stable  Last Vitals:  Vitals Value Taken Time  BP 113/65   Temp    Pulse 72 05/13/21 1247  Resp 15 05/13/21 1247  SpO2 95 % 05/13/21 1247  Vitals shown include unvalidated device data.  Last Pain:  Vitals:   05/13/21 0451  TempSrc: Oral  PainSc:          Complications: No notable events documented.

## 2021-05-13 NOTE — Progress Notes (Signed)
Rapid wean started at 2002.

## 2021-05-13 NOTE — Progress Notes (Signed)
Rapid Heart Ventilator Wean protocol started at 1625.

## 2021-05-13 NOTE — Procedures (Signed)
Extubation Procedure Note  Patient Details:   Name: Cadel Stairs DOB: [birth year] MRN: [id number]   Airway Documentation:    Vent end date: 05/13/21 Vent end time: 2050   Evaluation  O2 sats: stable throughout Complications: No apparent complications Patient did tolerate procedure well. Bilateral Breath Sounds: Clear, Diminished   Yes  Graciella Freer 05/13/2021, 8:54 PM  Pt extubated per rapid wean protocol. Pt was able to perform NIF of -21 and VC of 956ml. Pt had positive cuff leak and was placed on 4L [place] at this time.

## 2021-05-13 NOTE — Anesthesia Procedure Notes (Signed)
Arterial Line Insertion Start/End1/31/2023 6:55 AM, 05/13/2021 6:59 AM Performed by: Wilburn Cornelia, CRNA, CRNA  Patient location: Pre-op. Preanesthetic checklist: patient identified, IV checked, site marked, risks and benefits discussed, surgical consent, monitors and equipment checked, pre-op evaluation, timeout performed and anesthesia consent Lidocaine 1% used for infiltration and patient sedated Left, radial was placed Catheter size: 20 G Hand hygiene performed  and maximum sterile barriers used  Allen's test indicative of satisfactory collateral circulation Attempts: 1 Procedure performed without using ultrasound guided technique. Following insertion, Biopatch and dressing applied. Post procedure assessment: normal  Patient tolerated the procedure well with no immediate complications.

## 2021-05-13 NOTE — Anesthesia Procedure Notes (Signed)
Procedure Name: Intubation Date/Time: 05/13/2021 7:44 AM Performed by: Wilburn Cornelia, CRNA Pre-anesthesia Checklist: Patient identified, Emergency Drugs available, Suction available, Patient being monitored and Timeout performed Patient Re-evaluated:Patient Re-evaluated prior to induction Oxygen Delivery Method: Circle system utilized Preoxygenation: Pre-oxygenation with 100% oxygen Induction Type: IV induction Ventilation: Mask ventilation without difficulty Laryngoscope Size: Mac and 4 Grade View: Grade I Tube size: 8.0 mm Number of attempts: 1 Airway Equipment and Method: Stylet Placement Confirmation: ETT inserted through vocal cords under direct vision, positive ETCO2, breath sounds checked- equal and bilateral and CO2 detector Secured at: 22 cm Tube secured with: Tape Dental Injury: Teeth and Oropharynx as per pre-operative assessment

## 2021-05-13 NOTE — Progress Notes (Signed)
CT surgery p.m. Rounds  Patient going through second attempt at extubation with pressure support vent wean.  Earlier attempt unsuccessful due to sedation and weakness Hemodynamic stable Minimal chest tube output Postop labs reviewed and are satisfactory  Blood pressure 120/68, pulse 76, temperature 99 F (37.2 C), temperature source Bladder, resp. rate 14, height 5\' 8"  (1.727 m), weight 80.9 kg, SpO2 98 %.

## 2021-05-13 NOTE — Op Note (Signed)
[address]       [place] [address]             [phone]                                          05/13/2021 Patient:  Kevin [name] Pre-Op Dx: Coronary artery disease   Unstable angina   Right upper lobe pulmonary mass Post-op Dx: Same Procedure: CABG X 3.  LIMA to LAD, reverse saphenous vein graft to obtuse marginal 1, and left PDA. Core needle biopsy of the right upper lobe mass Right upper lobe wedge resection Endoscopic greater saphenous vein harvest on the right   Surgeon and Role:      * Tahjir Silveria, Lucile Crater, MD - Primary    *E. Barrett, PA-C- assisting An experienced assistant was required given the complexity of this surgery and the standard of surgical care. The assistant was needed for exposure, dissection, suctioning, retraction of delicate tissues and sutures, instrument exchange and for overall help during this procedure.    Anesthesia  general EBL: 500 ml Blood Administration: None Xclamp Time: 44 min Pump Time: 111 min  Drains: 19 F blake drain: R, L, mediastinal  Wires: Ventricular Counts: correct   Indications: This is a 81 year old gentleman this admitted following elective left heart cath for unstable angina.  Showed left main disease, and a mild stenosis in circumflex system.  His chest x-ray also showed a large right upper lobe pulmonary mass which was redemonstrated on CT scan.  He was taken to the operating theater for coronary artery bypass grafting and biopsy of this right upper lobe mass.  Findings: We initially performed a core needle biopsy of the right upper lobe mass, and the frozen section was inconclusive.  We then performed a wedge resection of the lesion, and it was consistent with non-small cell lung cancer.  Good LIMA.  The LAD was deeply intramyocardial.  We were able to find a good target in her distal LAD.  The obtuse marginal and left PDA were both moderate sized.  Operative Technique: All invasive  lines were placed in pre-op holding.  After the risks, benefits and alternatives were thoroughly discussed, the patient was brought to the operative theatre.  Anesthesia was induced, and the patient was prepped and draped in normal sterile fashion.  An appropriate surgical pause was performed, and pre-operative antibiotics were dosed accordingly.  We began with simultaneous incisions along the right leg for harvesting of the greater saphenous vein and the chest for the sternotomy.  In regards to the sternotomy, this was carried down with bovie cautery, and the sternum was divided with a reciprocating saw.  Meticulous hemostasis was obtained.  The left pleural space was then opened, and we identified the right-sided pulmonary nodule.  I performed several core needle biopsies of the lesion.  The frozen sections were inconclusive.  By the time these had resulted we had already heparinized the patient after harvesting the mammary.  The left internal thoracic artery was exposed and harvested in in pedicled fashion.  The patient was systemically heparinized, and the artery was divided distally, and placed in a papaverine sponge.    The sternal elevator was removed, and a retractor was placed.  The pericardium was divided in the midline and fashioned into a cradle with pericardial stitches.   After we confirmed an  appropriate ACT, the ascending aorta was cannulated in standard fashion.  The right atrial appendage was used for venous cannulation site.  Cardiopulmonary bypass was initiated, and the heart retractor was placed. The cross clamp was applied, and a dose of anterograde cardioplegia was given with good arrest of the heart.  We moved to the posterior wall of the heart, and found a good target on the LPDA.  An arteriotomy was made, and the vein graft was anastomosed to it in an end to side fashion.  Next we exposed the lateral wall, and found a good target on the obtuse marginal #1.  An end to side anastomosis  with the vein graft was then created.  Finally, we exposed a good target on the LAD, and fashioned an end to side anastomosis between it and the LITA.  We began to re-warm, and a re-animation dose of cardioplegia was given.  The heart was de-aired, and the cross clamp was removed.  Meticulous hemostasis was obtained.    A partial occludding clamp was then placed on the ascending aorta, and we created an end to side anastomosis between it and the proximal vein grafts.  Rings were placed on the proximal anastomosis.   . We then focused our attention back to the right upper lobe.  I performed a generous wedge resection of the pulmonary mass between several fires of an Endo GIA stapler.  This was passed off the specimen.  Hemostasis was obtained, and we separated from cardiopulmonary bypass without event.  The heparin was reversed with protamine.  Chest tubes and wires were placed, and the sternum was re-approximated with sternal wires.  The soft tissue and skin were re-approximated wth absorbable suture.    The patient tolerated the procedure without any immediate complications, and was transferred to the ICU in guarded condition.  Kevin [name] Kevin [name]

## 2021-05-13 NOTE — Brief Op Note (Signed)
05/06/2021 - 05/13/2021  8:04 AM  PATIENT:  Kevin [name]  81 y.o. male  PRE-OPERATIVE DIAGNOSIS:  CORONARY ARTERY DISEASE LEFT MAIN DISEASE  POST-OPERATIVE DIAGNOSIS:  CORONARY ARTERY DISEASE LEFT MAIN DISEASE  PROCEDURE:  Procedure(s):  CORONARY ARTERY BYPASS GRAFTING x 3 -LIMA to LAD -SVG to Left PDA -SVG to OM  GREATER SAPHENOUS VEIN HARVESTED ENDOSCOPICALLY (N/A) -Right Leg (35/15)  TRANSESOPHAGEAL ECHOCARDIOGRAM (TEE) (N/A)  APPLICATION OF CELL SAVER  RIGHT LUNG BIOPSY (Right)  SURGEON:  Surgeon(s) and Role:    * Lightfoot, Lucile Crater, MD - Primary  PHYSICIAN ASSISTANT: Ellwood Handler PA-C   ASSISTANTS: Ara Kussmaul RNFA   ANESTHESIA:   general  EBL:  495 mL   BLOOD ADMINISTERED:   CC CELLSAVER  DRAINS:  Right and Left Pleural Chest Tube, Mediastinal Chest Drain    LOCAL MEDICATIONS USED:  NONE  SPECIMEN:  Source of Specimen:  Right Lung Biopsy  DISPOSITION OF SPECIMEN:  PATHOLOGY  COUNTS:  YES  TOURNIQUET:  * No tourniquets in log *  DICTATION: .Dragon Dictation  PLAN OF CARE: Admit to inpatient   PATIENT DISPOSITION:  ICU - intubated and hemodynamically stable.   Delay start of Pharmacological VTE agent (>24hrs) due to surgical blood loss or risk of bleeding: yes

## 2021-05-13 NOTE — Plan of Care (Signed)

## 2021-05-13 NOTE — OR Nursing (Signed)
Pt wedding band yellow in color given to wife Opal Sidles.

## 2021-05-14 ENCOUNTER — Inpatient Hospital Stay (HOSPITAL_COMMUNITY): Payer: Medicare HMO

## 2021-05-14 ENCOUNTER — Encounter (HOSPITAL_COMMUNITY): Payer: Self-pay | Admitting: Thoracic Surgery (Cardiothoracic Vascular Surgery)

## 2021-05-14 DIAGNOSIS — Z09 Encounter for follow-up examination after completed treatment for conditions other than malignant neoplasm: Secondary | ICD-10-CM

## 2021-05-14 DIAGNOSIS — E785 Hyperlipidemia, unspecified: Secondary | ICD-10-CM

## 2021-05-14 DIAGNOSIS — Z951 Presence of aortocoronary bypass graft: Secondary | ICD-10-CM | POA: Diagnosis not present

## 2021-05-14 DIAGNOSIS — I25110 Atherosclerotic heart disease of native coronary artery with unstable angina pectoris: Secondary | ICD-10-CM | POA: Diagnosis not present

## 2021-05-14 DIAGNOSIS — R918 Other nonspecific abnormal finding of lung field: Secondary | ICD-10-CM | POA: Diagnosis not present

## 2021-05-14 DIAGNOSIS — Z9911 Dependence on respirator [ventilator] status: Secondary | ICD-10-CM | POA: Diagnosis not present

## 2021-05-14 DIAGNOSIS — E7800 Pure hypercholesterolemia, unspecified: Secondary | ICD-10-CM

## 2021-05-14 LAB — BASIC METABOLIC PANEL
Anion gap: 8 (ref 5–15)
Anion gap: 11 (ref 5–15)
BUN: 16 mg/dL (ref 8–23)
BUN: 20 mg/dL (ref 8–23)
CO2: 22 mmol/L (ref 22–32)
CO2: 19 mmol/L — ABNORMAL LOW (ref 22–32)
Calcium: 8 mg/dL — ABNORMAL LOW (ref 8.9–10.3)
Calcium: 8.6 mg/dL — ABNORMAL LOW (ref 8.9–10.3)
Chloride: 103 mmol/L (ref 98–111)
Chloride: 101 mmol/L (ref 98–111)
Creatinine, Ser: 1.36 mg/dL — ABNORMAL HIGH (ref 0.61–1.24)
Creatinine, Ser: 1.61 mg/dL — ABNORMAL HIGH (ref 0.61–1.24)
GFR, Estimated: 53 mL/min — ABNORMAL LOW (ref >60)
GFR, Estimated: 44 mL/min — ABNORMAL LOW (ref >60)
Glucose, Bld: 144 mg/dL — ABNORMAL HIGH (ref 70–99)
Glucose, Bld: 147 mg/dL — ABNORMAL HIGH (ref 70–99)
Potassium: 4.4 mmol/L (ref 3.5–5.1)
Potassium: 4.4 mmol/L (ref 3.5–5.1)
Sodium: 133 mmol/L — ABNORMAL LOW (ref 135–145)
Sodium: 131 mmol/L — ABNORMAL LOW (ref 135–145)

## 2021-05-14 LAB — CBC
HCT: 30.2 % — ABNORMAL LOW (ref 39.0–52.0)
HCT: 32.4 % — ABNORMAL LOW (ref 39.0–52.0)
Hemoglobin: 10.3 g/dL — ABNORMAL LOW (ref 13.0–17.0)
Hemoglobin: 10.7 g/dL — ABNORMAL LOW (ref 13.0–17.0)
MCH: 32.1 pg (ref 26.0–34.0)
MCH: 32 pg (ref 26.0–34.0)
MCHC: 34.1 g/dL (ref 30.0–36.0)
MCHC: 33 g/dL (ref 30.0–36.0)
MCV: 94.1 fL (ref 80.0–100.0)
MCV: 97 fL (ref 80.0–100.0)
Platelets: 154 10*3/uL (ref 150–400)
Platelets: 179 10*3/uL (ref 150–400)
RBC: 3.21 MIL/uL — ABNORMAL LOW (ref 4.22–5.81)
RBC: 3.34 MIL/uL — ABNORMAL LOW (ref 4.22–5.81)
RDW: 12.6 % (ref 11.5–15.5)
RDW: 12.9 % (ref 11.5–15.5)
WBC: 13.2 10*3/uL — ABNORMAL HIGH (ref 4.0–10.5)
WBC: 15.4 10*3/uL — ABNORMAL HIGH (ref 4.0–10.5)
nRBC: 0 % (ref 0.0–0.2)
nRBC: 0 % (ref 0.0–0.2)

## 2021-05-14 LAB — GLUCOSE, CAPILLARY
Glucose-Capillary: 134 mg/dL — ABNORMAL HIGH (ref 70–99)
Glucose-Capillary: 135 mg/dL — ABNORMAL HIGH (ref 70–99)
Glucose-Capillary: 138 mg/dL — ABNORMAL HIGH (ref 70–99)
Glucose-Capillary: 142 mg/dL — ABNORMAL HIGH (ref 70–99)
Glucose-Capillary: 144 mg/dL — ABNORMAL HIGH (ref 70–99)
Glucose-Capillary: 145 mg/dL — ABNORMAL HIGH (ref 70–99)
Glucose-Capillary: 147 mg/dL — ABNORMAL HIGH (ref 70–99)
Glucose-Capillary: 151 mg/dL — ABNORMAL HIGH (ref 70–99)
Glucose-Capillary: 160 mg/dL — ABNORMAL HIGH (ref 70–99)
Glucose-Capillary: 167 mg/dL — ABNORMAL HIGH (ref 70–99)
Glucose-Capillary: 167 mg/dL — ABNORMAL HIGH (ref 70–99)
Glucose-Capillary: 174 mg/dL — ABNORMAL HIGH (ref 70–99)

## 2021-05-14 LAB — MAGNESIUM
Magnesium: 2.4 mg/dL (ref 1.7–2.4)
Magnesium: 2.3 mg/dL (ref 1.7–2.4)

## 2021-05-14 MED ORDER — ENOXAPARIN SODIUM 40 MG/0.4ML IJ SOSY
40.0000 mg | PREFILLED_SYRINGE | Freq: Every day | INTRAMUSCULAR | Status: DC
  Administered 2021-05-14 – 2021-05-16 (×3): 40 mg via SUBCUTANEOUS
  Filled 2021-05-14 (×3): qty 0.4

## 2021-05-14 MED ORDER — PHENYLEPHRINE HCL-NACL 20-0.9 MG/250ML-% IV SOLN
INTRAVENOUS | Status: AC
  Filled 2021-05-14: qty 500

## 2021-05-14 MED ORDER — INSULIN ASPART 100 UNIT/ML IJ SOLN
0.0000 [IU] | INTRAMUSCULAR | Status: DC
  Administered 2021-05-14 – 2021-05-15 (×5): 2 [IU] via SUBCUTANEOUS

## 2021-05-14 MED ORDER — INSULIN ASPART 100 UNIT/ML IJ SOLN
0.0000 [IU] | INTRAMUSCULAR | Status: DC
  Administered 2021-05-14: 2 [IU] via SUBCUTANEOUS

## 2021-05-14 MED ORDER — METOPROLOL TARTRATE 25 MG PO TABS
25.0000 mg | ORAL_TABLET | Freq: Two times a day (BID) | ORAL | Status: DC
  Administered 2021-05-14 – 2021-05-17 (×5): 25 mg via ORAL
  Filled 2021-05-14 (×5): qty 1

## 2021-05-14 NOTE — Progress Notes (Signed)
NAMEJaivon [name], MRN:  [id number], DOB:  [birth year], LOS: 8 ADMISSION DATE:  05/06/2021, CONSULTATION DATE:  05/14/21 REFERRING MD:  Kipp Brood, CHIEF COMPLAINT:   post-op management  History of Present Illness:  Kevin [name] is a 81 y.o. M with PMH of HL and CAD withrecent chest pain who has recently had 3-4 weeks of exertional dyspnea and presented to cardiology where he was found to have inferolateral ST depressions on EKG.  He presented for to the OR today for CABGx3, LIMA to LAD, SVG to Left PDA SVG to OM.  The procedure was uncomplicated and was transferred to intensive care post-op on Neosynephrine and mechanically ventilated.  Also had recent CT chest 1/25 with RUL spiculated lesions.  RUL bx also completed today  Pertinent  Medical History   has a past medical history of Hyperlipidemia.   Significant Hospital Events: Including procedures, antibiotic start and stop dates in addition to other pertinent events   1/31 Admitted to Cardiothoracic Surgery for CABG x3 and RUL bx, transferred to ICU; extubated   Interim History / Subjective:  Patient extubated overnight Doing well on 3L [place] this morning Denies any pain  Objective   Blood pressure (!) 107/50, pulse 80, temperature 99.3 F (37.4 C), resp. rate (!) 9, height 5\' 8"  (1.727 m), weight 86.6 kg, SpO2 96 %. CVP:  [1 mmHg-19 mmHg] 10 mmHg  Vent Mode: PSV;CPAP FiO2 (%):  [40 %-50 %] 40 % Set Rate:  [4 bmp-16 bmp] 4 bmp Vt Set:  [540 mL] 540 mL PEEP:  [5 cmH20] 5 cmH20 Pressure Support:  [10 cmH20] 10 cmH20 Plateau Pressure:  [18 cmH20] 18 cmH20   Intake/Output Summary (Last 24 hours) at 05/14/2021 0708 Last data filed at 05/14/2021 0700 Gross per 24 hour  Intake 4410.05 ml  Output 4731 ml  Net -320.95 ml    Filed Weights   05/12/21 0340 05/13/21 0154 05/14/21 0500  Weight: 80.9 kg 80.9 kg 86.6 kg    General:   NAD HEENT: MM pink/moist; Leslie in place Neuro: Aox3; MAE CV: s1s2, no m/r/g PULM:  dim clear BS  bilaterally; [place] 3L GI: soft, bsx4 active  Extremities: warm/dry, no edema  Skin: no rashes or lesions appreciated  Resolved Hospital Problem list   Expected post-op ventilator dependence  Assessment & Plan:  Unstable Angina and CAD with CABGx3: LIMA to LAD, SVG to Left PDA, SVG to OM HLD P: -TCTS primary; appreciate recs -extubated overnight -Pulm toiletry: IS -prn oxy and tramadol for pain -cont ASA and statin  Right upper lobe lung mass probable non-small cell lung cancer P: -will need further follow up outpatient on biopsy results with pulmonary  Mild Hyponatremia P: -trend bmp  AKI P: -Trend BMP / urinary output -Replace electrolytes as indicated -Avoid nephrotoxic agents, ensure adequate renal perfusion   Best Practice (right click and "Reselect all SmartList Selections" daily)   Diet/type: Regular consistency (see orders) DVT prophylaxis: SCD GI prophylaxis: PPI Lines: Central line Foley:  Yes, and it is still needed Code Status:  full code Last date of multidisciplinary goals of care discussion [per primary]  Labs   CBC: Recent Labs  Lab 05/11/21 1307 05/13/21 0501 05/13/21 0756 05/13/21 1022 05/13/21 1046 05/13/21 1300 05/13/21 1717 05/13/21 1827 05/13/21 2045 05/13/21 2155 05/14/21 0458  WBC 4.9 6.1  --   --   --  8.9  --  9.1  --   --  13.2*  HGB 13.4 13.6   < > 9.3*   < >  11.3* 9.2* 10.4* 9.5* 9.5* 10.3*  HCT 38.7* 39.5   < > 26.2*   < > 33.6* 27.0* 29.6* 28.0* 28.0* 30.2*  MCV 92.8 92.7  --   --   --  94.9  --  93.7  --   --  94.1  PLT 204 217  --  167  --  142*  --  122*  --   --  154   < > = values in this interval not displayed.     Basic Metabolic Panel: Recent Labs  Lab 05/11/21 1307 05/13/21 0501 05/13/21 0756 05/13/21 1013 05/13/21 1016 05/13/21 1113 05/13/21 1147 05/13/21 1151 05/13/21 1717 05/13/21 1827 05/13/21 2045 05/13/21 2155 05/14/21 0458  NA 136 136   < > 134*   < > 133* 134*   < > 137 134* 137 136 133*  K  4.4 4.4   < > 6.4*   < > 6.4* 5.9*   < > 4.3 4.8 4.3 4.3 4.4  CL 104 105   < > 98  --  101 103  --   --  105  --   --  103  CO2 26 23  --   --   --   --   --   --   --  22  --   --  22  GLUCOSE 109* 95   < > 102*  --  116* 128*  --   --  138*  --   --  144*  BUN 15 20   < > 19  --  19 18  --   --  17  --   --  16  CREATININE 1.57* 1.57*   < > 1.20  --  1.20 1.20  --   --  1.35*  --   --  1.36*  CALCIUM 8.8* 9.3  --   --   --   --   --   --   --  7.8*  --   --  8.0*  MG  --   --   --   --   --   --   --   --   --  3.1*  --   --  2.4   < > = values in this interval not displayed.    GFR: Estimated Creatinine Clearance: 47.9 mL/min (A) (by C-G formula based on SCr of 1.36 mg/dL (H)). Recent Labs  Lab 05/13/21 0501 05/13/21 1300 05/13/21 1827 05/14/21 0458  WBC 6.1 8.9 9.1 13.2*     Liver Function Tests: Recent Labs  Lab 05/11/21 1307  AST 15  ALT 16  ALKPHOS 63  BILITOT 1.1  PROT 6.5  ALBUMIN 3.6    No results for input(s): LIPASE, AMYLASE in the last 168 hours. No results for input(s): AMMONIA in the last 168 hours.  ABG    Component Value Date/Time   PHART 7.340 (L) 05/13/2021 2155   PCO2ART 39.1 05/13/2021 2155   PO2ART 83 05/13/2021 2155   HCO3 21.0 05/13/2021 2155   TCO2 22 05/13/2021 2155   ACIDBASEDEF 4.0 (H) 05/13/2021 2155   O2SAT 95.0 05/13/2021 2155      Coagulation Profile: Recent Labs  Lab 05/13/21 0501 05/13/21 1300  INR 1.0 1.3*     Cardiac Enzymes: No results for input(s): CKTOTAL, CKMB, CKMBINDEX, TROPONINI in the last 168 hours.  HbA1C: Hgb A1c MFr Bld  Date/Time Value Ref Range Status  05/13/2021 05:01 AM 5.2 4.8 - 5.6 %  Final    Comment:    (NOTE) Pre diabetes:          5.7%-6.4%  Diabetes:              >6.4%  Glycemic control for   <7.0% adults with diabetes     CBG: Recent Labs  Lab 05/14/21 0004 05/14/21 0055 05/14/21 0159 05/14/21 0300 05/14/21 0454  GLUCAP 145* 174* 167* 160* 147*    Review of Systems:    Unable to obtain secondary to intubation  Past Medical History:  He,  has a past medical history of Hyperlipidemia.   Surgical History:   Past Surgical History:  Procedure Laterality Date   epidural     back pain   IR CT SPINE LTD  06/18/2020   IR KYPHO LUMBAR INC FX REDUCE BONE BX UNI/BIL CANNULATION INC/IMAGING  06/18/2020   LEFT HEART CATH AND CORONARY ANGIOGRAPHY N/A 05/06/2021   Procedure: LEFT HEART CATH AND CORONARY ANGIOGRAPHY;  Surgeon: [name], MD;  Location: [facility];  Service: Cardiovascular;  Laterality: N/A;   MINOR HEMORRHOIDECTOMY     SHOULDER SURGERY     TRANSURETHRAL RESECTION OF BLADDER TUMOR  2014   TRANSURETHRAL RESECTION OF BLADDER TUMOR N/A 05/19/2018   Procedure: TRANSURETHRAL RESECTION OF BLADDER TUMOR (TURBT) AND (INTRAVESICAL GEMCITABINE INSTILLED IN BLADDER IN PACU) ;  Surgeon: Franchot Gallo, MD;  Location: [facility];  Service: Urology;  Laterality: N/A;  30 MINS     Social History:   reports that he quit smoking about 26 years ago. His smoking use included cigarettes. He has a 30.00 pack-year smoking history. He has never used smokeless tobacco. He reports that he does not drink alcohol and does not use drugs.   Family History:  His family history includes Alzheimer's disease (age of onset: 57) in his mother; Congestive Heart Failure (age of onset: 75) in his father; Heart disease (age of onset: 77) in his brother; Heart failure (age of onset: 34) in his brother; Heart failure (age of onset: 39) in his brother.   Allergies No Known Allergies   Home Medications  Prior to Admission medications   Medication Sig Start Date End Date Taking? Authorizing Provider  aspirin EC 81 MG tablet Take 81 mg by mouth daily.   Yes [provider]  atorvastatin (LIPITOR) 80 MG tablet Take 1 tablet (80 mg total) by mouth daily. 05/01/21  Yes [name], MD  isosorbide mononitrate (IMDUR) 60 MG 24 hr tablet Take 0.5 tablets (30 mg total) by mouth daily.  05/01/21 07/30/21 Yes [name], MD  metoprolol tartrate (LOPRESSOR) 25 MG tablet Take 1 tablet (25 mg total) by mouth 2 (two) times daily. 05/01/21 07/30/21 Yes [name], MD  nitroGLYCERIN (NITROSTAT) 0.4 MG SL tablet Place 0.4 mg under the tongue every 5 (five) minutes as needed for chest pain. 04/09/21  Yes [provider]          JD Rexene Agent  Pulmonary & Critical Care 05/14/2021, 7:33 AM  Please see [email or web address] for pager details.  From 7A-7P if no response, please call [phone]. After hours, please call ELink [phone].

## 2021-05-14 NOTE — Discharge Summary (Signed)
[address]       [place] [address]             [phone]    Physician Discharge Summary  Patient ID: Kevin [name] MRN: [id number] DOB/AGE: [birth year] 81 y.o.  Admit date: 05/06/2021 Discharge date: 05/17/2021  Admission Diagnoses:  Patient Active Problem List   Diagnosis Date Noted   Hyperlipidemia    Mass of upper lobe of right lung    Ventilator dependence ([place])    Coronary artery disease involving native coronary artery of native heart with unstable angina pectoris ([facility]) 05/05/2021   Discharge Diagnoses:  Patient Active Problem List   Diagnosis Date Noted   S/P Wedge Resection Right Upper lobe 05/14/2021   S/P CABG x 3    Hyperlipidemia    Mass of upper lobe of right lung    Ventilator dependence ([place])    Coronary artery disease involving native coronary artery of native heart with unstable angina pectoris ([place]) 05/05/2021   Discharged Condition: good  History of Present Illness:  Kevin [name] is a 81 yo male who was referred to Dr. Einar Gip with complaints of chest pain and abnormal EKG.  He has known history of HLD.  His symptoms had been occurring for about a month with minimal exertional activity.  EKG was obtained and showed ST segment depression in the inferolateral leads.  The patient denied associated shortness of breath, nausea, and vomiting.  His medication regimen was adjusted and he was set up for cardiac catheterization.  This was performed on 05/06/2021 and showed Left main disease.  It was felt the patient would require coronary bypass grafting and he was admitted to the hospital for further care.     Hospital Course:  The patient was evaluated by Dr. Kipp Brood who felt patient would benefit from coronary artery bypass grafting.  The risks and benefits of the procedure were explained to the patient and was agreeable to proceed.  CXR obtained as part of preoperative workup showed evidence of a right upper lung mass.  CT of the chest  was obtained and this area was concerning for primary lung cancer.  It was felt we could still proceed with surgery and lung biopsy for diagnosis could be performed at the time of coronary bypass.   The patient was taken to the operating room and underwent CABG x 3 utilizing LIMA to LAD, SVG to OM, and SVG to left PDA.  He also underwent endoscopic harvest of greater saphenous vein from his right leg.  He also underwent wedge resection of the right upper lobe mass.  He tolerated the procedure without difficulty and was taken to the SICU in stable condition.  The patient was extubated the evening of surgery.  During his stay in the SICU he did not require inotropic support.  He was in Sinus Tachycardia and beta blocker therapy was initiated.  He underwent went removal of arterial line and swan ganz catheter on POD #1. The patient's chest tubes were removed without difficulty.  The patient was started on diuretics for volume overloaded state.  He was weaned off the Insulin drip. His pre op HGA1C was 5.2. Accu checks and SS PRN were stopped. He was maintaining NSR and transferred to the progressive floor unit on 05/15/2021.  He presented with ACS and DAPT therapy will be initiated prior to discharge. He had expected post op blood loss anemia. He did not require a post op transfusion. He is ambulating on  room air with good oxygenation. All wounds are clean, dry, and healing without signs of infection. He is tolerating a diet and has had a bowel movement. Will start Plavix at discharge. As discussed with Dr. Kipp Brood, he is felt surgically stable for discharge today.  Consults: None  Significant Diagnostic Studies: radiology:   CT scan:    FINDINGS: Cardiovascular: There is no cardiomegaly or pericardial effusion. There is coronary vascular calcification. Advanced atherosclerotic calcification of the thoracic aorta. No aneurysmal dilatation or dissection. The origins of the great vessels of the aortic  arch appear patent as visualized. The central pulmonary arteries appear patent.   Mediastinum/Nodes: No hilar or mediastinal adenopathy. The esophagus is grossly unremarkable. No mediastinal fluid collection.   Lungs/Pleura: Background of severe paraseptal emphysema. Two adjacent lesions in the right upper lobe with spiculated margins measuring 4.0 x 3.5 cm along the major fissure and 2.0 x 2.2 cm adjacent to the right upper lobe bronchus. There is thickening of the peribronchial tissues and occlusion of the right upper lobe bronchus. Findings most consistent with malignancy. Clinical correlation and multidisciplinary consult is advised. There is no pleural effusion pneumothorax.   Upper Abdomen: No acute abnormality.   Musculoskeletal: Degenerative changes of the spine. No acute osseous pathology.   IMPRESSION: 1. Right upper lobe spiculated lesions consistent with malignancy. Multidisciplinary consult is advised. 2. No evidence of metastatic disease. 3. Aortic Atherosclerosis (ICD10-I70.0) and Emphysema (ICD10-J43.9).     Electronically Signed   By: Anner Crete M.D.   On: 05/07/2021 23:50  Angiography:   Left Heart Catheterization 05/06/21:  LV: 126/4, EDP 11 mmHg.  Ao 123/46, mean 33 mmHg.  No pressure gradient across aortic valve. LVEF: 40 to 45% with global hypokinesis.  LV is mildly dilated. LM: High-grade, complex, calcified 95% distal left main stenosis. LAD: Proximal segment mild to moderately calcified.  Proximal to mid 60% calcific stenosis.  Gives origin to 3 large diagonals and a fourth large diagonal from the mid to distal segment, mild disease.  No significant calcification mid to distal LAD. CX: Gives origin to high OM1 which is got mild disease, after that there is a 60% calcific focal stenosis.  Mid to distal circumflex is large, dominant with very mild noncalcific disease. RCA: Nondominant, normal.  Recommendation: Patient will need to be admitted to  the hospital in view of unstable anginal symptoms and a critical left main disease.  I have already contacted Dr. Melodie Bouillon to consider for CABG.  50 mL contrast utilized.  Treatments: surgery:   05/13/2021 Patient:  Kevin [name] Pre-Op Dx: Coronary artery disease                         Unstable angina                         Right upper lobe pulmonary mass Post-op Dx: Same Procedure: CABG X 3.  LIMA to LAD, reverse saphenous vein graft to obtuse marginal 1, and left PDA. Core needle biopsy of the right upper lobe mass Right upper lobe wedge resection Endoscopic greater saphenous vein harvest on the right    Surgeon and Role:      * Lightfoot, Lucile Crater, MD - Primary    *E. Barrett, PA-C- assisting An experienced assistant was required given the complexity of this surgery and the standard of surgical care. The assistant was needed for exposure, dissection, suctioning, retraction of delicate tissues and sutures,  instrument exchange and for overall help during this procedure.    Discharge Exam: Blood pressure (!) 100/52, pulse 90, temperature 98.8 F (37.1 C), temperature source Oral, resp. rate 20, height 5\' 8"  (1.727 m), weight 87.4 kg, SpO2 97 %.  Cardiovascular: RRR Pulmonary: Clear to auscultation bilaterally Abdomen: Soft, non tender, bowel sounds present. Extremities: Mild bilateral lower extremity edema. Wounds: Clean and dry.  No erythema or signs of infection.     Discharge Medications:  The patient has been discharged on:   1.Beta Blocker:  Yes [ X  ]                              No   [   ]                              If No, reason:  2.Ace Inhibitor/ARB: Yes [   ]                                     No  [  x  ]                                     If No, reason:Labile BP  3.Statin:   Yes [ X  ]                  No  [   ]                  If No, reason:  4.Ecasa:  Yes  [ X  ]                  No   [   ]                  If No, reason:  Patient  had ACS upon admission:  Plavix/P2Y12 inhibitor: Yes [ X  ]                                      No  [   ]     Discharge Instructions     Amb Referral to Cardiac Rehabilitation   Complete by: As directed    Diagnosis: CABG   CABG X ___: 3   After initial evaluation and assessments completed: Virtual Based Care may be provided alone or in conjunction with Phase 2 Cardiac Rehab based on patient barriers.: Yes      Allergies as of 05/17/2021   No Known Allergies      Medication List     STOP taking these medications    isosorbide mononitrate 60 MG 24 hr tablet Commonly known as: IMDUR       TAKE these medications    aspirin EC 81 MG tablet Take 81 mg by mouth daily.   atorvastatin 80 MG tablet Commonly known as: LIPITOR Take 1 tablet (80 mg total) by mouth daily.   clopidogrel 75 MG tablet Commonly known as: Plavix Take 1 tablet (75 mg total) by mouth daily.   furosemide 40 MG tablet Commonly known as: LASIX Take 1 tablet (40 mg total) by  mouth daily. For 5 days then stop.   metoprolol tartrate 25 MG tablet Commonly known as: LOPRESSOR Take 1 tablet (25 mg total) by mouth 2 (two) times daily.   nitroGLYCERIN 0.4 MG SL tablet Commonly known as: NITROSTAT Place 0.4 mg under the tongue every 5 (five) minutes as needed for chest pain.   potassium chloride SA 20 MEQ tablet Commonly known as: KLOR-CON M Take 1 tablet (20 mEq total) by mouth daily. For 5 days then stop.   traMADol 50 MG tablet Commonly known as: ULTRAM Take 1 tablet (50 mg total) by mouth every 6 (six) hours as needed for moderate pain.        Follow-up Information     Lajuana Matte, MD Follow up on 05/30/2021.   Specialty: Cardiothoracic Surgery Why: This appointment is via TELEPHONE. Please do NOT go to the office. Dr. Kipp Brood will call you  at 2:40 pm Contact information: [address] [place] [address] [phone]         [name], MD Follow up on  05/28/2021.   Specialty: Cardiology Why: 05/28/21 @3 :30 PM. Arrive 15 min early. Bring all medications Contact information: [place] [address] [phone]                 Signed: Lars Pinks PA-C 05/19/2021, 7:51 AM

## 2021-05-14 NOTE — Discharge Instructions (Signed)

## 2021-05-14 NOTE — Progress Notes (Addendum)
[address]       [place] [address]             [phone]      1 Day Post-Op Procedure(s) (LRB): CORONARY ARTERY BYPASS GRAFTING (CABG) TIMES 3  , ON PUMP, USING LEFT INTERNAL MAMMARY ARTERY AND RIGHT GREATER SAPHENOUS VEIN HARVESTED ENDOSCOPICALLY (N/A) TRANSESOPHAGEAL ECHOCARDIOGRAM (TEE) (N/A) APPLICATION OF CELL SAVER ENDOVEIN HARVEST OF GREATER SAPHENOUS VEIN (Right) RIGHT LUNG BIOPSY (Right)  Subjective:  Patient states he feels bad  Having pain anytime he moves around or trys to take a deep breath.  Denies N/V.  Objective: Vital signs in last 24 hours: Temp:  [98.6 F (37 C)-99.5 F (37.5 C)] 99.3 F (37.4 C) (02/01 0800) Pulse Rate:  [69-98] 86 (02/01 0800) Cardiac Rhythm: Normal sinus rhythm (02/01 0800) Resp:  [8-26] 15 (02/01 0800) BP: (91-162)/(47-88) 132/59 (02/01 0800) SpO2:  [90 %-100 %] 94 % (02/01 0800) Arterial Line BP: (90-306)/(39-295) 122/52 (02/01 0800) FiO2 (%):  [40 %-50 %] 40 % (01/31 2020) Weight:  [86.6 kg] 86.6 kg (02/01 0500)  Hemodynamic parameters for last 24 hours: CVP:  [1 mmHg-19 mmHg] 8 mmHg  Intake/Output from previous day: 01/31 0701 - 02/01 0700 In: 4410.1 [I.V.:3156.6; Blood:305; IV Piggyback:948.5] Out: 6270 [Urine:3610; Blood:495; Chest Tube:626] Intake/Output this shift: Total I/O In: 31.9 [I.V.:31.9] Out: 50 [Urine:50]  General appearance: alert, cooperative, and no distress Heart: regular rate and rhythm Lungs: diminished breath sounds bilaterally Abdomen: soft, non-tender; bowel sounds normal; no masses,  no organomegaly Extremities: edema trace Wound: clean and dry  Lab Results: Recent Labs    05/13/21 1827 05/13/21 2045 05/13/21 2155 05/14/21 0458  WBC 9.1  --   --  13.2*  HGB 10.4*   < > 9.5* 10.3*  HCT 29.6*   < > 28.0* 30.2*  PLT 122*  --   --  154   < > = values in this interval not displayed.   BMET:  Recent Labs    05/13/21 1827 05/13/21 2045 05/13/21 2155  05/14/21 0458  NA 134*   < > 136 133*  K 4.8   < > 4.3 4.4  CL 105  --   --  103  CO2 22  --   --  22  GLUCOSE 138*  --   --  144*  BUN 17  --   --  16  CREATININE 1.35*  --   --  1.36*  CALCIUM 7.8*  --   --  8.0*   < > = values in this interval not displayed.    PT/INR:  Recent Labs    05/13/21 1300  LABPROT 15.8*  INR 1.3*   ABG    Component Value Date/Time   PHART 7.340 (L) 05/13/2021 2155   HCO3 21.0 05/13/2021 2155   TCO2 22 05/13/2021 2155   ACIDBASEDEF 4.0 (H) 05/13/2021 2155   O2SAT 95.0 05/13/2021 2155   CBG (last 3)  Recent Labs    05/14/21 0300 05/14/21 0454 05/14/21 0650  GLUCAP 160* 147* 151*    Assessment/Plan: S/P Procedure(s) (LRB): CORONARY ARTERY BYPASS GRAFTING (CABG) TIMES 3  , ON PUMP, USING LEFT INTERNAL MAMMARY ARTERY AND RIGHT GREATER SAPHENOUS VEIN HARVESTED ENDOSCOPICALLY (N/A) TRANSESOPHAGEAL ECHOCARDIOGRAM (TEE) (N/A) APPLICATION OF CELL SAVER ENDOVEIN HARVEST OF GREATER SAPHENOUS VEIN (Right) RIGHT LUNG BIOPSY (Right)  CV- Sinus Tach, BP stable- will start low dose Lopressor today, will need Plavix prior to discharge for ACS Pulm- s/p wedge resection  RUL, CT output 626 since surgery, no air leak present, will place right pleural tube to waters seal... CXR w/o pneumothorax, + atelectasis 3.  Renal- creatinine stable at 1.36, lytes are okay.. minimal edema, will                    Hold off on lasix for ow 4. Expected post operative blood loss anemia- 10.3 5. CBGs mostly controlled, continue SSIP 6. Dispo- patient stable, POD #1 progression orders, place right pleural tube on water seal    LOS: 8 days   Ellwood Handler, PA-C 05/14/2021  Agree with above No events overnight Will remove wires and foley Will keep [place]

## 2021-05-15 ENCOUNTER — Inpatient Hospital Stay (HOSPITAL_COMMUNITY): Payer: Medicare HMO

## 2021-05-15 DIAGNOSIS — R918 Other nonspecific abnormal finding of lung field: Secondary | ICD-10-CM | POA: Diagnosis not present

## 2021-05-15 DIAGNOSIS — I25110 Atherosclerotic heart disease of native coronary artery with unstable angina pectoris: Secondary | ICD-10-CM | POA: Diagnosis not present

## 2021-05-15 DIAGNOSIS — Z951 Presence of aortocoronary bypass graft: Secondary | ICD-10-CM | POA: Diagnosis not present

## 2021-05-15 LAB — BASIC METABOLIC PANEL WITH GFR
Anion gap: 8 (ref 5–15)
BUN: 20 mg/dL (ref 8–23)
CO2: 25 mmol/L (ref 22–32)
Calcium: 8.2 mg/dL — ABNORMAL LOW (ref 8.9–10.3)
Chloride: 97 mmol/L — ABNORMAL LOW (ref 98–111)
Creatinine, Ser: 1.39 mg/dL — ABNORMAL HIGH (ref 0.61–1.24)
GFR, Estimated: 52 mL/min — ABNORMAL LOW
Glucose, Bld: 120 mg/dL — ABNORMAL HIGH (ref 70–99)
Potassium: 4.1 mmol/L (ref 3.5–5.1)
Sodium: 130 mmol/L — ABNORMAL LOW (ref 135–145)

## 2021-05-15 LAB — CBC
HCT: 28.6 % — ABNORMAL LOW (ref 39.0–52.0)
Hemoglobin: 9.5 g/dL — ABNORMAL LOW (ref 13.0–17.0)
MCH: 31.7 pg (ref 26.0–34.0)
MCHC: 33.2 g/dL (ref 30.0–36.0)
MCV: 95.3 fL (ref 80.0–100.0)
Platelets: 142 10*3/uL — ABNORMAL LOW (ref 150–400)
RBC: 3 MIL/uL — ABNORMAL LOW (ref 4.22–5.81)
RDW: 13 % (ref 11.5–15.5)
WBC: 11.7 10*3/uL — ABNORMAL HIGH (ref 4.0–10.5)
nRBC: 0 % (ref 0.0–0.2)

## 2021-05-15 LAB — GLUCOSE, CAPILLARY
Glucose-Capillary: 115 mg/dL — ABNORMAL HIGH (ref 70–99)
Glucose-Capillary: 131 mg/dL — ABNORMAL HIGH (ref 70–99)
Glucose-Capillary: 125 mg/dL — ABNORMAL HIGH (ref 70–99)

## 2021-05-15 MED ORDER — ~~LOC~~ CARDIAC SURGERY, PATIENT & FAMILY EDUCATION: Freq: Once | Status: AC

## 2021-05-15 MED ORDER — SODIUM CHLORIDE 0.9% FLUSH
3.0000 mL | INTRAVENOUS | Status: DC | PRN
  Administered 2021-05-16: 3 mL via INTRAVENOUS

## 2021-05-15 MED ORDER — SODIUM CHLORIDE 0.9% FLUSH
3.0000 mL | Freq: Two times a day (BID) | INTRAVENOUS | Status: DC
  Administered 2021-05-15 – 2021-05-16 (×3): 3 mL via INTRAVENOUS

## 2021-05-15 MED ORDER — FUROSEMIDE 10 MG/ML IJ SOLN
40.0000 mg | Freq: Once | INTRAMUSCULAR | Status: AC
  Administered 2021-05-15: 40 mg via INTRAVENOUS
  Filled 2021-05-15: qty 4

## 2021-05-15 MED ORDER — SODIUM CHLORIDE 0.9 % IV SOLN: 250.0000 mL | INTRAVENOUS | Status: DC | PRN

## 2021-05-15 NOTE — Plan of Care (Signed)

## 2021-05-15 NOTE — Progress Notes (Signed)
° °  NAMEMarvis [name], MRN:  [id number], DOB:  1940/06/06, LOS: 9 ADMISSION DATE:  05/06/2021, CONSULTATION DATE:  05/15/21 REFERRING MD:  Kipp Brood, CHIEF COMPLAINT:   post-op management  History of Present Illness:  Kevin [name] is a 81 y.o. M with PMH of HL and CAD withrecent chest pain who has recently had 3-4 weeks of exertional dyspnea and presented to cardiology where he was found to have inferolateral ST depressions on EKG.  He presented for to the OR today for CABGx3, LIMA to LAD, SVG to Left PDA SVG to OM.  The procedure was uncomplicated and was transferred to intensive care post-op on Neosynephrine and mechanically ventilated.  Also had recent CT chest 1/25 with RUL spiculated lesions.  RUL bx also completed today  Pertinent  Medical History   has a past medical history of Hyperlipidemia.   Significant Hospital Events: Including procedures, antibiotic start and stop dates in addition to other pertinent events   1/31 Admitted to Cardiothoracic Surgery for CABG x3 and RUL bx, transferred to ICU; extubated   Interim History / Subjective:  POD 2 post CABG/RUL biopsy  Objective   Blood pressure (!) 117/57, pulse 88, temperature 98.7 F (37.1 C), resp. rate (!) 23, height 5\' 8"  (1.727 m), weight 86 kg, SpO2 97 %.        Intake/Output Summary (Last 24 hours) at 05/15/2021 0300 Last data filed at 05/15/2021 0800 Gross per 24 hour  Intake 1390.1 ml  Output 575 ml  Net 815.1 ml    Filed Weights   05/13/21 0154 05/14/21 0500 05/15/21 0500  Weight: 80.9 kg 86.6 kg 86 kg    General:  NAD HEENT: MM pink/moist; [place] in place Neuro: Aox3; MAE CV: s1s2, no m/r/g PULM:  dim clear BS bilaterally; [place] 3L GI: soft, bsx4 active  Extremities: warm/dry, no edema  Skin: no rashes or lesions appreciated  Resolved Hospital Problem list   Expected post-op ventilator dependence  Assessment & Plan:  Unstable Angina and CAD with CABGx3: LIMA to LAD, SVG to Left PDA, SVG to  OM HLD P: -TCTS primary; appreciate recs -POD 2 -Pulm toiletry: IS -prn oxy and tramadol for pain -cont ASA and statin  Right upper lobe lung mass probable non-small cell lung cancer P: -will need further follow up outpatient on biopsy results with pulmonary -follow pathology results  Mild Hyponatremia P: -trend bmp  AKI: slowly improving P: -Trend BMP / urinary output -Replace electrolytes as indicated -Avoid nephrotoxic agents, ensure adequate renal perfusion   Best Practice (right click and "Reselect all SmartList Selections" daily)   Diet/type: Regular consistency (see orders) DVT prophylaxis: SCD GI prophylaxis: PPI Lines: Central line Foley:  Yes, and it is still needed Code Status:  full code Last date of multidisciplinary goals of care discussion [per primary]         JD Rexene Agent [facility] 05/15/2021, 8:28 AM  Please see [email or web address] for pager details.  From 7A-7P if no response, please call [phone]. After hours, please call ELink [phone].

## 2021-05-15 NOTE — Progress Notes (Signed)
° °   °  [address]       [place] [address]             [phone]                 2 Days Post-Op Procedure(s) (LRB): CORONARY ARTERY BYPASS GRAFTING (CABG) TIMES 3  , ON PUMP, USING LEFT INTERNAL MAMMARY ARTERY AND RIGHT GREATER SAPHENOUS VEIN HARVESTED ENDOSCOPICALLY (N/A) TRANSESOPHAGEAL ECHOCARDIOGRAM (TEE) (N/A) APPLICATION OF CELL SAVER ENDOVEIN HARVEST OF GREATER SAPHENOUS VEIN (Right) RIGHT LUNG BIOPSY (Right)   Events: Some confusion overnight _______________________________________________________________ Vitals: BP (!) 117/57    Pulse 88    Temp (!) 97.5 F (36.4 C) (Oral)    Resp (!) 23    Ht 5\' 8"  (1.727 m)    Wt 86 kg    SpO2 97%    BMI 28.83 kg/m  Filed Weights   05/13/21 0154 05/14/21 0500 05/15/21 0500  Weight: 80.9 kg 86.6 kg 86 kg     - Neuro: alert NAD  - Cardiovascular: sinus  Drips: none.   CVP:  [8 mmHg] 8 mmHg  - Pulm: EWOB    ABG    Component Value Date/Time   PHART 7.340 (L) 05/13/2021 2155   PCO2ART 39.1 05/13/2021 2155   PO2ART 83 05/13/2021 2155   HCO3 21.0 05/13/2021 2155   TCO2 22 05/13/2021 2155   ACIDBASEDEF 4.0 (H) 05/13/2021 2155   O2SAT 95.0 05/13/2021 2155    - Abd: ND - Extremity: warm  .Intake/Output      02/01 0701 02/02 0700 02/02 0701 02/03 0700   P.O. 930    I.V. (mL/kg) 72 (0.8)    Blood     IV Piggyback 200    Total Intake(mL/kg) 1202 (14)    Urine (mL/kg/hr) 185 (0.1)    Emesis/NG output     Blood     Chest Tube 440    Total Output 625    Net +577            _______________________________________________________________ Labs: CBC Latest Ref Rng & Units 05/15/2021 05/14/2021 05/14/2021  WBC 4.0 - 10.5 K/uL 11.7(H) 15.4(H) 13.2(H)  Hemoglobin 13.0 - 17.0 g/dL 9.5(L) 10.7(L) 10.3(L)  Hematocrit 39.0 - 52.0 % 28.6(L) 32.4(L) 30.2(L)  Platelets 150 - 400 K/uL 142(L) 179 154   CMP Latest Ref Rng & Units 05/15/2021 05/14/2021 05/14/2021  Glucose 70 - 99 mg/dL 120(H) 147(H) 144(H)  BUN 8 -  23 mg/dL 20 20 16   Creatinine 0.61 - 1.24 mg/dL 1.39(H) 1.61(H) 1.36(H)  Sodium 135 - 145 mmol/L 130(L) 131(L) 133(L)  Potassium 3.5 - 5.1 mmol/L 4.1 4.4 4.4  Chloride 98 - 111 mmol/L 97(L) 101 103  CO2 22 - 32 mmol/L 25 19(L) 22  Calcium 8.9 - 10.3 mg/dL 8.2(L) 8.6(L) 8.0(L)  Total Protein 6.5 - 8.1 g/dL - - -  Total Bilirubin 0.3 - 1.2 mg/dL - - -  Alkaline Phos 38 - 126 U/L - - -  AST 15 - 41 U/L - - -  ALT 0 - 44 U/L - - -    CXR: PV congestion  _______________________________________________________________  Assessment and Plan: POD 2 s/p CABG, RUL biopsy  Neuro: pain controlled CV: on A/S/BB Pulm: will remove CTs Renal: creat trending down.  Will diurese today GI: on diet Heme: stable ID: afebrile Endo: SSI Dispo: floor today   Lajuana Matte 05/15/2021 7:42 AM

## 2021-05-15 NOTE — Progress Notes (Signed)
05/15/2021 1500 Received pt to room 4E-03, transfer from [place].  Pt is A&O, no C/O voiced.  Tele monitor applied and CCMD notified.  CHG bath given.  Oriented to room, call light and bed.  Call bell in reach, family at bedside. [place]

## 2021-05-16 ENCOUNTER — Inpatient Hospital Stay (HOSPITAL_COMMUNITY): Payer: Medicare HMO

## 2021-05-16 LAB — CBC
HCT: 28.7 % — ABNORMAL LOW (ref 39.0–52.0)
Hemoglobin: 9.7 g/dL — ABNORMAL LOW (ref 13.0–17.0)
MCH: 31.8 pg (ref 26.0–34.0)
MCHC: 33.8 g/dL (ref 30.0–36.0)
MCV: 94.1 fL (ref 80.0–100.0)
Platelets: 142 10*3/uL — ABNORMAL LOW (ref 150–400)
RBC: 3.05 MIL/uL — ABNORMAL LOW (ref 4.22–5.81)
RDW: 12.9 % (ref 11.5–15.5)
WBC: 9.8 10*3/uL (ref 4.0–10.5)
nRBC: 0 % (ref 0.0–0.2)

## 2021-05-16 LAB — BASIC METABOLIC PANEL
Anion gap: 9 (ref 5–15)
BUN: 21 mg/dL (ref 8–23)
CO2: 25 mmol/L (ref 22–32)
Calcium: 8.3 mg/dL — ABNORMAL LOW (ref 8.9–10.3)
Chloride: 97 mmol/L — ABNORMAL LOW (ref 98–111)
Creatinine, Ser: 1.33 mg/dL — ABNORMAL HIGH (ref 0.61–1.24)
GFR, Estimated: 55 mL/min — ABNORMAL LOW (ref >60)
Glucose, Bld: 113 mg/dL — ABNORMAL HIGH (ref 70–99)
Potassium: 4 mmol/L (ref 3.5–5.1)
Sodium: 131 mmol/L — ABNORMAL LOW (ref 135–145)

## 2021-05-16 LAB — TYPE AND SCREEN
ABO/RH(D): O POS
Antibody Screen: NEGATIVE
Unit division: 0
Unit division: 0

## 2021-05-16 LAB — BPAM RBC
Blood Product Expiration Date: 202302252359
Blood Product Expiration Date: 202302272359
ISSUE DATE / TIME: 202301231115
Unit Type and Rh: 5100
Unit Type and Rh: 5100

## 2021-05-16 LAB — MAGNESIUM: Magnesium: 1.9 mg/dL (ref 1.7–2.4)

## 2021-05-16 MED ORDER — FUROSEMIDE 40 MG PO TABS
40.0000 mg | ORAL_TABLET | Freq: Every day | ORAL | Status: DC
  Administered 2021-05-16 – 2021-05-17 (×2): 40 mg via ORAL
  Filled 2021-05-16 (×2): qty 1

## 2021-05-16 MED ORDER — SORBITOL 70 % SOLN
30.0000 mL | Freq: Once | Status: AC
  Administered 2021-05-16: 30 mL via ORAL
  Filled 2021-05-16: qty 30

## 2021-05-16 MED ORDER — POTASSIUM CHLORIDE CRYS ER 20 MEQ PO TBCR
20.0000 meq | EXTENDED_RELEASE_TABLET | Freq: Every day | ORAL | Status: DC
  Administered 2021-05-16 – 2021-05-17 (×2): 20 meq via ORAL
  Filled 2021-05-16 (×2): qty 1

## 2021-05-16 NOTE — Progress Notes (Signed)
Mobility Specialist: Progress Note   05/16/21 1102  Mobility  Activity Ambulated independently in hallway  Level of Assistance Independent  Assistive Device None  Distance Ambulated (ft) 470 ft  Activity Response Tolerated well  $Mobility charge 1 Mobility   Pre-Mobility: 92 HR, 146/76 BP Post-Mobility: 107 HR, 155/73 BP, 98% SpO2  Received pt in bed having no complaints and agreeable to mobility. Asymptomatic throughout ambulation, returned back to bed w/ call bell in reach and all needs met.  [facility] Kevin [name] Mobility Specialist Mobility Specialist 4 [place]: [phone] Mobility Specialist 2 [place] and [place]: [phone]

## 2021-05-16 NOTE — Progress Notes (Addendum)
° °   °  [address]       [place] [address]             [phone]        3 Days Post-Op Procedure(s) (LRB): CORONARY ARTERY BYPASS GRAFTING (CABG) TIMES 3  , ON PUMP, USING LEFT INTERNAL MAMMARY ARTERY AND RIGHT GREATER SAPHENOUS VEIN HARVESTED ENDOSCOPICALLY (N/A) TRANSESOPHAGEAL ECHOCARDIOGRAM (TEE) (N/A) APPLICATION OF CELL SAVER ENDOVEIN HARVEST OF GREATER SAPHENOUS VEIN (Right) RIGHT LUNG BIOPSY (Right)  Subjective: Patient with not much appetite;denies abdominal pain, nausea, or vomiting. He has not had bowel movement yet.  Objective: Vital signs in last 24 hours: Temp:  [97.1 F (36.2 C)-98.7 F (37.1 C)] 97.9 F (36.6 C) (02/03 0320) Pulse Rate:  [79-86] 83 (02/03 0320) Cardiac Rhythm: Normal sinus rhythm (02/02 1917) Resp:  [8-18] 12 (02/03 0320) BP: (99-137)/(50-72) 135/72 (02/03 0320) SpO2:  [96 %-98 %] 96 % (02/03 0320) Weight:  [87.5 kg] 87.5 kg (02/03 0320)  Pre op weight  80.9 kg Current Weight  05/16/21 87.5 kg       Intake/Output from previous day: 02/02 0701 - 02/03 0700 In: 340 [P.O.:240; IV Piggyback:100] Out: 1185 [Urine:1075; Chest Tube:110]   Physical Exam:  Cardiovascular: RRR Pulmonary: Clear to auscultation bilaterally Abdomen: Soft, non tender, bowel sounds present. Extremities: Mild bilateral lower extremity edema. Wounds: Clean and dry.  No erythema or signs of infection.  Lab Results: CBC: Recent Labs    05/15/21 0425 05/16/21 0230  WBC 11.7* 9.8  HGB 9.5* 9.7*  HCT 28.6* 28.7*  PLT 142* 142*   BMET:  Recent Labs    05/15/21 0425 05/16/21 0230  NA 130* 131*  K 4.1 4.0  CL 97* 97*  CO2 25 25  GLUCOSE 120* 113*  BUN 20 21  CREATININE 1.39* 1.33*  CALCIUM 8.2* 8.3*    PT/INR:  Lab Results  Component Value Date   INR 1.3 (H) 05/13/2021   INR 1.0 05/13/2021   INR 1.0 06/18/2020   ABG:  INR: Will add last result for INR, ABG once components are confirmed Will add last 4 CBG results once  components are confirmed  Assessment/Plan:  1. CV - SR. On Lopressor 25 mg bid 2.  Pulmonary - On room air this am, but was on 4 liters of oxygen via [place].  Check PA/LAT CXR. Await final pathology of right lung mass. Encourage incentive spirometer. 3. Volume Overload - Above pre op weight and creatinine 1.33 this am. As discussed with Dr. Kipp Brood, diurese with oral Lasix 4.  Expected post op acute blood loss anemia - H and H this am stable at 9.7 and 28.7 5. AKI-Creatinine this am slightly decreased to 1.33. Creatinine upon admission was 1.57. 6. Mild thrombocytopenia-platelets this am remain 142,000 7. LOC constipation  Donielle M ZimmermanPA-C 05/16/2021,7:53 AM    Agree with above. Will diurese today. [place]

## 2021-05-16 NOTE — Care Management Important Message (Signed)
Important Message  Patient Details  Name: Kevin [name] MRN: [id number] Date of Birth: 1941/03/03   Medicare Important Message Given:  Yes     Shelda Altes 05/16/2021, 8:53 AM

## 2021-05-16 NOTE — Progress Notes (Signed)
CARDIAC REHAB PHASE I   PRE:  Rate/Rhythm: 80 SR    BP: sitting 121/60    SaO2: 94 RA  MODE:  Ambulation: 430 ft   POST:  Rate/Rhythm: 97 SR with PVC    BP: sitting 140/72     SaO2: 94 RA  Moving well. Ambulated without assist but did need direction for location (tried to go in someone else's room). Return to bed as pt declined recliner. VSS. Discussed with pt and wife IS, sternal precautions, diet, exercise, and CRPII. Voiced reception. 600 ml IS, encouraged more use. Will refer to [place].  [place], ACSM 05/16/2021 2:16 PM

## 2021-05-17 LAB — BASIC METABOLIC PANEL
Anion gap: 9 (ref 5–15)
BUN: 17 mg/dL (ref 8–23)
CO2: 24 mmol/L (ref 22–32)
Calcium: 8.2 mg/dL — ABNORMAL LOW (ref 8.9–10.3)
Chloride: 98 mmol/L (ref 98–111)
Creatinine, Ser: 1.33 mg/dL — ABNORMAL HIGH (ref 0.61–1.24)
GFR, Estimated: 55 mL/min — ABNORMAL LOW (ref >60)
Glucose, Bld: 96 mg/dL (ref 70–99)
Potassium: 3.4 mmol/L — ABNORMAL LOW (ref 3.5–5.1)
Sodium: 131 mmol/L — ABNORMAL LOW (ref 135–145)

## 2021-05-17 MED ORDER — POTASSIUM CHLORIDE CRYS ER 20 MEQ PO TBCR: 20.0000 meq | EXTENDED_RELEASE_TABLET | Freq: Every day | ORAL | 0 refills | Status: DC

## 2021-05-17 MED ORDER — TRAMADOL HCL 50 MG PO TABS: 50.0000 mg | ORAL_TABLET | Freq: Four times a day (QID) | ORAL | 0 refills | Status: DC | PRN

## 2021-05-17 MED ORDER — CLOPIDOGREL BISULFATE 75 MG PO TABS: 75.0000 mg | ORAL_TABLET | Freq: Every day | ORAL | 1 refills | Status: DC

## 2021-05-17 MED ORDER — FUROSEMIDE 40 MG PO TABS: 40.0000 mg | ORAL_TABLET | Freq: Every day | ORAL | 0 refills | Status: DC

## 2021-05-17 NOTE — Progress Notes (Signed)
CARDIAC REHAB PHASE I   Pt denies questions or concerns. Anxious to d/c today. Referred to CRP II [place].  [id number] Rufina Falco, RN BSN 05/17/2021 10:18 AM

## 2021-05-17 NOTE — Progress Notes (Addendum)
° °   °  [address]       [place] [address]             [phone]        4 Days Post-Op Procedure(s) (LRB): CORONARY ARTERY BYPASS GRAFTING (CABG) TIMES 3  , ON PUMP, USING LEFT INTERNAL MAMMARY ARTERY AND RIGHT GREATER SAPHENOUS VEIN HARVESTED ENDOSCOPICALLY (N/A) TRANSESOPHAGEAL ECHOCARDIOGRAM (TEE) (N/A) APPLICATION OF CELL SAVER ENDOVEIN HARVEST OF GREATER SAPHENOUS VEIN (Right) RIGHT LUNG BIOPSY (Right)  Subjective: Patient has had bowel movement yet.  Objective: Vital signs in last 24 hours: Temp:  [97.9 F (36.6 C)-99.4 F (37.4 C)] 98.8 F (37.1 C) (02/04 0439) Pulse Rate:  [78-95] 78 (02/04 0439) Cardiac Rhythm: Normal sinus rhythm (02/03 2000) Resp:  [15-19] 19 (02/04 0439) BP: (97-155)/(61-77) 124/77 (02/04 0439) SpO2:  [96 %-98 %] 96 % (02/04 0439) Weight:  [87.4 kg] 87.4 kg (02/04 0439)  Pre op weight  80.9 kg Current Weight  05/17/21 87.4 kg       Intake/Output from previous day: 02/03 0701 - 02/04 0700 In: 240 [P.O.:240] Out: 1250 [Urine:1250]   Physical Exam:  Cardiovascular: RRR Pulmonary: Clear to auscultation bilaterally Abdomen: Soft, non tender, bowel sounds present. Extremities: Mild bilateral lower extremity edema. Wounds: Clean and dry.  No erythema or signs of infection.  Lab Results: CBC: Recent Labs    05/15/21 0425 05/16/21 0230  WBC 11.7* 9.8  HGB 9.5* 9.7*  HCT 28.6* 28.7*  PLT 142* 142*    BMET:  Recent Labs    05/16/21 0230 05/17/21 0132  NA 131* 131*  K 4.0 3.4*  CL 97* 98  CO2 25 24  GLUCOSE 113* 96  BUN 21 17  CREATININE 1.33* 1.33*  CALCIUM 8.3* 8.2*     PT/INR:  Lab Results  Component Value Date   INR 1.3 (H) 05/13/2021   INR 1.0 05/13/2021   INR 1.0 06/18/2020   ABG:  INR: Will add last result for INR, ABG once components are confirmed Will add last 4 CBG results once components are confirmed  Assessment/Plan:  1. CV - SR, PVCs. On Lopressor 25 mg bid 2.  Pulmonary  - On room air this am.  Await final pathology of right lung mass. Encourage incentive spirometer. 3. Volume Overload - Above pre op weight and creatinine 1.33 this am. As discussed with Dr. Kipp Brood, diurese with oral Lasix 4.  Expected post op acute blood loss anemia - H and H this am stable at 9.7 and 28.7 5. AKI-Creatinine this am remains 1.33. Creatinine upon admission was 1.57. 6. Mild thrombocytopenia-last platelets 142,000 7. Likely discharge  Donielle M ZimmermanPA-C 05/17/2021,7:33 AM    Agree with above  Home today  [place]

## 2021-05-19 ENCOUNTER — Ambulatory Visit: Payer: Medicare HMO | Admitting: Cardiology

## 2021-05-19 LAB — SURGICAL PATHOLOGY

## 2021-05-19 MED FILL — Mannitol IV Soln 20%: INTRAVENOUS | Qty: 500 | Status: AC

## 2021-05-19 MED FILL — Electrolyte-R (PH 7.4) Solution: INTRAVENOUS | Qty: 4000 | Status: AC

## 2021-05-19 MED FILL — Sodium Chloride IV Soln 0.9%: INTRAVENOUS | Qty: 3000 | Status: AC

## 2021-05-19 MED FILL — Calcium Chloride Inj 10%: INTRAVENOUS | Qty: 10 | Status: AC

## 2021-05-19 MED FILL — Potassium Chloride Inj 2 mEq/ML: INTRAVENOUS | Qty: 40 | Status: AC

## 2021-05-19 MED FILL — Sodium Bicarbonate IV Soln 8.4%: INTRAVENOUS | Qty: 50 | Status: AC

## 2021-05-19 MED FILL — Heparin Sodium (Porcine) Inj 1000 Unit/ML: Qty: 1000 | Status: AC

## 2021-05-19 MED FILL — Heparin Sodium (Porcine) Inj 1000 Unit/ML: INTRAMUSCULAR | Qty: 10 | Status: AC

## 2021-05-19 MED FILL — Lidocaine HCl Local Preservative Free (PF) Inj 2%: INTRAMUSCULAR | Qty: 15 | Status: AC

## 2021-05-20 MED FILL — Heparin Sodium (Porcine) Inj 1000 Unit/ML: INTRAMUSCULAR | Qty: 5000 | Status: AC

## 2021-05-21 DIAGNOSIS — C679 Malignant neoplasm of bladder, unspecified: Secondary | ICD-10-CM | POA: Diagnosis not present

## 2021-05-21 DIAGNOSIS — I25119 Atherosclerotic heart disease of native coronary artery with unspecified angina pectoris: Secondary | ICD-10-CM | POA: Diagnosis not present

## 2021-05-21 DIAGNOSIS — I779 Disorder of arteries and arterioles, unspecified: Secondary | ICD-10-CM | POA: Diagnosis not present

## 2021-05-21 DIAGNOSIS — Z09 Encounter for follow-up examination after completed treatment for conditions other than malignant neoplasm: Secondary | ICD-10-CM | POA: Diagnosis not present

## 2021-05-21 DIAGNOSIS — I77810 Thoracic aortic ectasia: Secondary | ICD-10-CM | POA: Diagnosis not present

## 2021-05-23 ENCOUNTER — Other Ambulatory Visit: Payer: Self-pay

## 2021-05-23 ENCOUNTER — Ambulatory Visit (INDEPENDENT_AMBULATORY_CARE_PROVIDER_SITE_OTHER): Payer: Self-pay | Admitting: Thoracic Surgery (Cardiothoracic Vascular Surgery)

## 2021-05-23 VITALS — BP 95/61 | HR 80 | Resp 20 | Ht 70.0 in | Wt 172.0 lb

## 2021-05-23 DIAGNOSIS — I2510 Atherosclerotic heart disease of native coronary artery without angina pectoris: Secondary | ICD-10-CM

## 2021-05-23 DIAGNOSIS — Z951 Presence of aortocoronary bypass graft: Secondary | ICD-10-CM

## 2021-05-24 ENCOUNTER — Other Ambulatory Visit: Payer: Self-pay | Admitting: Cardiology

## 2021-05-24 DIAGNOSIS — I200 Unstable angina: Secondary | ICD-10-CM

## 2021-05-26 NOTE — Progress Notes (Signed)
° °   °  [address]       [place] [address]             [phone]        [facility] Medical Record #[id number] Date of Birth: 06-18-1940  Referring: [name], MD Primary Care: [name], MD Primary Cardiologist:None  Reason for visit:   follow-up  History of Present Illness:     81 year old male status post CABG and wedge resection of a right upper lobe cancer comes in for his 1 week follow-up appointment.  Overall he is doing well.  Physical Exam: BP 95/61    Pulse 80    Resp 20    Ht 5\' 10"  (1.778 m)    Wt 172 lb (78 kg)    SpO2 97% Comment: RA   BMI 24.68 kg/m   Alert NAD Incision clean.  Sternum stable Abdomen soft, ND No peripheral edema   Diagnostic Studies & Laboratory data:  Path:   FINAL MICROSCOPIC DIAGNOSIS:   A. LUNG, RIGHT, BIOPSY:  - Microscopic focus of adenocarcinoma, see comment   B. LUNG, RIGHT UPPER LOBE, BIOPSY:  - Invasive moderately differentiated adenocarcinoma, acinar predominant,  3.3 cm  - Carcinoma invades into the visceral pleural surface  - Resection margin is focally less than 0.1 cm from the tumor     Assessment / Plan:   81 year old male status post CABG, and wedge resection of a right upper lobe adenocarcinoma.  He is recovering well from the CABG.  I will see him back in a month.  At that point, we will order PET/CT and complete his evaluation from an oncologic standpoint.   Lajuana Matte 05/26/2021 9:33 AM

## 2021-05-28 ENCOUNTER — Other Ambulatory Visit: Payer: Self-pay

## 2021-05-28 ENCOUNTER — Ambulatory Visit: Payer: Medicare HMO | Admitting: Cardiology

## 2021-05-28 ENCOUNTER — Encounter: Payer: Self-pay | Admitting: Cardiology

## 2021-05-28 VITALS — BP 115/59 | HR 71 | Temp 98.0°F | Resp 17 | Ht 70.0 in | Wt 174.0 lb

## 2021-05-28 DIAGNOSIS — Z951 Presence of aortocoronary bypass graft: Secondary | ICD-10-CM | POA: Diagnosis not present

## 2021-05-28 DIAGNOSIS — C3491 Malignant neoplasm of unspecified part of right bronchus or lung: Secondary | ICD-10-CM | POA: Diagnosis not present

## 2021-05-28 DIAGNOSIS — E7800 Pure hypercholesterolemia, unspecified: Secondary | ICD-10-CM | POA: Diagnosis not present

## 2021-05-28 DIAGNOSIS — I2510 Atherosclerotic heart disease of native coronary artery without angina pectoris: Secondary | ICD-10-CM | POA: Diagnosis not present

## 2021-05-28 NOTE — Progress Notes (Signed)
Primary Physician/Referring:  [name], MD  Patient ID: Kevin [name], male    DOB: 1940-07-26, 81 y.o.   MRN: [id number]  Chief Complaint  Patient presents with   Hospitalization Follow-up   HPI:    Kevin [name]  is a 81 y.o. Caucasian male with history of hyperlipidemia, 30-pack-year history of smoking quit in 1987, who was referred to our office for evaluation of chest pain and abnormal EKG.  Patient underwent cardiac catheterization 05/01/2021 revealing multivessel disease including critical left main disease, subsequently underwent CABG x3 on 05/13/2021.  During admission patient diagnosed with right upper lung adenocarcinoma, and underwent lung wedge resection. He is following with CT surgery and oncology for further management. Echocardiogram 04/2021 revealed LVEF 40-45%.   Patient now present for follow up.  States that he is doing very well, he has not had any shortness of breath, no chest pain.  He has resumed most of his activities at home without any significant discomfort.  He is being extremely careful with regard to his diet.  Past Medical History:  Diagnosis Date   Hyperlipidemia    Past Surgical History:  Procedure Laterality Date   CORONARY ARTERY BYPASS GRAFT N/A 05/13/2021   Procedure: CORONARY ARTERY BYPASS GRAFTING (CABG) TIMES 3  , ON PUMP, USING LEFT INTERNAL MAMMARY ARTERY AND RIGHT GREATER SAPHENOUS VEIN HARVESTED ENDOSCOPICALLY;  Surgeon: Lajuana Matte, MD;  Location: [facility];  Service: Open Heart Surgery;  Laterality: N/A;   ENDOVEIN HARVEST OF GREATER SAPHENOUS VEIN Right 05/13/2021   Procedure: ENDOVEIN HARVEST OF GREATER SAPHENOUS VEIN;  Surgeon: Lajuana Matte, MD;  Location: [facility];  Service: Open Heart Surgery;  Laterality: Right;   epidural     back pain   IR CT SPINE LTD  06/18/2020   IR KYPHO LUMBAR INC FX REDUCE BONE BX UNI/BIL CANNULATION INC/IMAGING  06/18/2020   LEFT HEART CATH AND CORONARY ANGIOGRAPHY N/A 05/06/2021   Procedure: LEFT  HEART CATH AND CORONARY ANGIOGRAPHY;  Surgeon: [name], MD;  Location: [facility];  Service: Cardiovascular;  Laterality: N/A;   LUNG BIOPSY Right 05/13/2021   Procedure: RIGHT LUNG BIOPSY;  Surgeon: Lajuana Matte, MD;  Location: [facility];  Service: Open Heart Surgery;  Laterality: Right;   MINOR HEMORRHOIDECTOMY     SHOULDER SURGERY     TEE WITHOUT CARDIOVERSION N/A 05/13/2021   Procedure: TRANSESOPHAGEAL ECHOCARDIOGRAM (TEE);  Surgeon: Lajuana Matte, MD;  Location: [facility];  Service: Open Heart Surgery;  Laterality: N/A;   TRANSURETHRAL RESECTION OF BLADDER TUMOR  2014   TRANSURETHRAL RESECTION OF BLADDER TUMOR N/A 05/19/2018   Procedure: TRANSURETHRAL RESECTION OF BLADDER TUMOR (TURBT) AND (INTRAVESICAL GEMCITABINE INSTILLED IN BLADDER IN PACU) ;  Surgeon: Franchot Gallo, MD;  Location: [facility];  Service: Urology;  Laterality: N/A;  84 MINS   Family History  Problem Relation Age of Onset   Alzheimer's disease Mother 43   Congestive Heart Failure Father 71   Heart disease Brother 67   Heart failure Brother 81   Heart failure Brother 63    Social History   Tobacco Use   Smoking status: Former    Packs/day: 1.00    Years: 30.00    Pack years: 30.00    Types: Cigarettes    Quit date: 04/14/1995    Years since quitting: 26.1   Smokeless tobacco: Never  Substance Use Topics   Alcohol use: Never   Marital Status: Married  ROS  Review of Systems  Cardiovascular:  Negative for chest pain, dyspnea on exertion and leg swelling.  Gastrointestinal:  Negative for melena.   Objective  Blood pressure (!) 115/59, pulse 71, temperature 98 F (36.7 C), temperature source Temporal, resp. rate 17, height 5\' 10"  (1.778 m), weight 174 lb (78.9 kg), SpO2 98 %. Body mass index is 24.97 kg/m.  Vitals with BMI 05/28/2021 05/23/2021 05/17/2021  Height 5\' 10"  5\' 10"  -  Weight 174 lbs 172 lbs -  BMI 09.32 67.12 -  Systolic 458 95 099  Diastolic 59 61 52  Pulse 71 80 90     Physical Exam Neck:     Vascular: No carotid bruit or JVD.  Cardiovascular:     Rate and Rhythm: Normal rate and regular rhythm.     Pulses: Intact distal pulses.     Heart sounds: Normal heart sounds. No murmur heard.   No gallop.  Pulmonary:     Effort: Pulmonary effort is normal.     Breath sounds: Normal breath sounds.  Abdominal:     General: Bowel sounds are normal.     Palpations: Abdomen is soft.  Musculoskeletal:        General: No swelling.    Laboratory examination:   CMP Latest Ref Rng & Units 05/17/2021 05/16/2021 05/15/2021  Glucose 70 - 99 mg/dL 96 113(H) 120(H)  BUN 8 - 23 mg/dL 17 21 20   Creatinine 0.61 - 1.24 mg/dL 1.33(H) 1.33(H) 1.39(H)  Sodium 135 - 145 mmol/L 131(L) 131(L) 130(L)  Potassium 3.5 - 5.1 mmol/L 3.4(L) 4.0 4.1  Chloride 98 - 111 mmol/L 98 97(L) 97(L)  CO2 22 - 32 mmol/L 24 25 25   Calcium 8.9 - 10.3 mg/dL 8.2(L) 8.3(L) 8.2(L)  Total Protein 6.5 - 8.1 g/dL - - -  Total Bilirubin 0.3 - 1.2 mg/dL - - -  Alkaline Phos 38 - 126 U/L - - -  AST 15 - 41 U/L - - -  ALT 0 - 44 U/L - - -   CBC Latest Ref Rng & Units 05/16/2021 05/15/2021 05/14/2021  WBC 4.0 - 10.5 K/uL 9.8 11.7(H) 15.4(H)  Hemoglobin 13.0 - 17.0 g/dL 9.7(L) 9.5(L) 10.7(L)  Hematocrit 39.0 - 52.0 % 28.7(L) 28.6(L) 32.4(L)  Platelets 150 - 400 K/uL 142(L) 142(L) 179   Lipid Panel     Component Value Date/Time   LDLDIRECT 80 05/01/2021 1146   HEMOGLOBIN A1C Lab Results  Component Value Date   HGBA1C 5.2 05/13/2021   MPG 102.54 05/13/2021   TSH Recent Labs    05/07/21 0915  TSH 3.714   External labs:   07/05/2020: Total cholesterol 149, triglycerides 134, HDL 40, LDL 85.  Allergies  No Known Allergies   Medication list after today's encounter    Current Outpatient Medications:    aspirin EC 81 MG tablet, Take 81 mg by mouth daily., Disp: , Rfl:    atorvastatin (LIPITOR) 80 MG tablet, Take 1 tablet (80 mg total) by mouth daily., Disp: 90 tablet, Rfl: 3   clopidogrel  (PLAVIX) 75 MG tablet, Take 1 tablet (75 mg total) by mouth daily., Disp: 30 tablet, Rfl: 1   metoprolol tartrate (LOPRESSOR) 25 MG tablet, TAKE 1 TABLET BY MOUTH TWICE A DAY, Disp: 180 tablet, Rfl: 0   nitroGLYCERIN (NITROSTAT) 0.4 MG SL tablet, Place 0.4 mg under the tongue every 5 (five) minutes as needed for chest pain., Disp: , Rfl:    traMADol (ULTRAM) 50 MG tablet, Take 1 tablet (50 mg total) by mouth every 6 (six) hours as  needed for moderate pain., Disp: 30 tablet, Rfl: 0  Radiology:   CT chest 05/07/2021: 1. Right upper lobe spiculated lesions consistent with malignancy. Multidisciplinary consult is advised. 2. No evidence of metastatic disease. 3. Aortic Atherosclerosis (ICD10-I70.0) and Emphysema (ICD10-J43.9).  Chest x-ray 05/16/2021: 1. Post removal of chest support tubes and RIGHT IJ central venous line. No visible pneumothorax. 2. Improved aeration in the chest with trace LEFT effusion. 3. Diminished opacity in the RIGHT upper lobe adjacent to surgical changes at the site of previous mass may reflect resolving pleural fluid and or atelectasis in this area, close attention on follow-up.  Cardiac Studies:   Left Heart Catheterization 05/06/21:  LV: 126/4, EDP 11 mmHg.  Ao 123/46, mean 33 mmHg.  No pressure gradient across aortic valve. LVEF: 40 to 45% with global hypokinesis.  LV is mildly dilated. LM: High-grade, complex, calcified 95% distal left main stenosis. LAD: Proximal segment mild to moderately calcified.  Proximal to mid 60% calcific stenosis.  Gives origin to 3 large diagonals and a fourth large diagonal from the mid to distal segment, mild disease.  No significant calcification mid to distal LAD. CX: Gives origin to high OM1 which is got mild disease, after that there is a 60% calcific focal stenosis.  Mid to distal circumflex is large, dominant with very mild noncalcific disease. RCA: Nondominant, normal.   Echocardiogram 05/07/2021:  1. Left ventricular ejection  fraction, by estimation, is 40 to 45%. The left ventricle has mildly decreased function. The left ventricle demonstrates global hypokinesis. Left ventricular diastolic parameters were normal.   2. Right ventricular systolic function is normal. The right ventricular size is normal.   3. The mitral valve is normal in structure. Trivial mitral valve regurgitation. No evidence of mitral stenosis.   4. The aortic valve is normal in structure. Aortic valve regurgitation is not visualized. No aortic stenosis is present.  CABG x3 05/13/2021:  LIMA to LAD, reverse SVG to OM- 1 and left PDA. Core needle biopsy of the right upper lobe mass unsuccessful and hence underwent right upper lobe wedge resection  EKG:   EKG 05/28/2021: Normal sinus rhythm at rate of 68 bpm, normal axis, LVH with repolarization abnormality, cannot exclude inferior lateral ischemia.  PAC (3).   Assessment     ICD-10-CM   1. Coronary artery disease involving native coronary artery of native heart, unspecified whether angina present  I25.10 EKG 12-Lead    CANCELED: EKG 12-Lead    2. CABG x3 05/13/2021: LIMA to LAD, reverse SVG to OM- 1 and left PDA.  Z95.1     3. Hypercholesteremia  E78.00     4. Adenocarcinoma of right lung [facility])  C34.91 Ambulatory referral to Hematology / Oncology      There are no discontinued medications.   No orders of the defined types were placed in this encounter.  Orders Placed This Encounter  Procedures   Ambulatory referral to Hematology / Oncology    Referral Priority:   Routine    Referral Type:   Consultation    Referral Reason:   Specialty Services Required    Requested Specialty:   Oncology    Number of Visits Requested:   1   EKG 12-Lead   Recommendations:   Finis Cathey is a 81 y.o. Caucasian male with history of hyperlipidemia, 30-pack-year history of smoking quit in 1987, who was referred to our office for evaluation of chest pain and abnormal EKG.  Patient underwent cardiac  catheterization 05/01/2021 revealing multivessel disease  including critical left main disease, subsequently underwent CABG x3 on 05/13/2021.  During admission patient diagnosed with right upper lung adenocarcinoma, and underwent lung wedge resection. He is following with CT surgery and oncology for further management. Echocardiogram 04/2021 revealed LVEF 40-45%.  He has recuperated remarkably well.  I am very pleased with his progress.  No clinical evidence of heart failure, sternotomy scar has seated very well.  No leg edema, no clinical evidence of heart failure.  He is on low-dose beta-blocker, continue the same.  He is also on high intensity statins, continue the same.  For now continue dual antiplatelet therapy with aspirin and Plavix.  I will make a referral for oncology for evaluation of right lung mass.  He is being also followed by Dr. Kipp Brood regarding this.  Advised him not to drive until cleared by Dr. Kipp Brood.  I will see him back in 4 to 6 weeks for follow-up.  He will eventually need follow-up lipids and also CBC but I am sure with ongoing hematologic issues and oncology issues, he will have this performed regularly.  Advised him that he should contact me if he does not hear from oncology soon.  His calcium supplements were discontinued due to coronary calcification while in the hospital, however he has severe osteoporosis, I do not have any contraindication for him to resume taking vitamin D and calcium supplements.   [name], MD, [facility] 05/28/2021, 3:49 PM Office: [phone] Fax: [phone] Pager: [phone]

## 2021-05-29 ENCOUNTER — Encounter: Payer: Self-pay | Admitting: *Deleted

## 2021-05-29 ENCOUNTER — Telehealth: Payer: Self-pay | Admitting: Internal Medicine

## 2021-05-29 ENCOUNTER — Other Ambulatory Visit: Payer: Self-pay | Admitting: *Deleted

## 2021-05-29 NOTE — Telephone Encounter (Signed)
Scheduled appt per 2/15 referral. Pt is aware of appt date and time. Pt is aware to arrive 15 mins prior to appt time and to bring and updated insurance card. Pt is aware of appt location.

## 2021-05-29 NOTE — Progress Notes (Signed)
The proposed treatment discussed in cancer conference 05/29/21 is for discussion purpose only and is not a binding recommendation.  The patient was not physically examined nor present for their treatment options.  Therefore, final treatment plans cannot be decided.

## 2021-05-30 ENCOUNTER — Telehealth: Payer: Medicare HMO | Admitting: Thoracic Surgery (Cardiothoracic Vascular Surgery)

## 2021-06-02 ENCOUNTER — Telehealth: Payer: Self-pay

## 2021-06-02 NOTE — Telephone Encounter (Signed)
Pt called and stated that his pharmacy wanted to know if he should be taking the isosorbide. I see where it was added, but it was not on his med list and I do not see when it was taken off. Should pt be on this medication. Please advise.

## 2021-06-03 DIAGNOSIS — Z299 Encounter for prophylactic measures, unspecified: Secondary | ICD-10-CM | POA: Diagnosis not present

## 2021-06-03 DIAGNOSIS — Z789 Other specified health status: Secondary | ICD-10-CM | POA: Diagnosis not present

## 2021-06-03 DIAGNOSIS — G4700 Insomnia, unspecified: Secondary | ICD-10-CM | POA: Diagnosis not present

## 2021-06-03 DIAGNOSIS — C3491 Malignant neoplasm of unspecified part of right bronchus or lung: Secondary | ICD-10-CM | POA: Diagnosis not present

## 2021-06-03 DIAGNOSIS — N1831 Chronic kidney disease, stage 3a: Secondary | ICD-10-CM | POA: Diagnosis not present

## 2021-06-03 NOTE — Telephone Encounter (Signed)
Called and spoke to pt, pt voiced understanding.

## 2021-06-03 NOTE — Telephone Encounter (Signed)
He can stop it

## 2021-06-08 ENCOUNTER — Other Ambulatory Visit: Payer: Self-pay | Admitting: Physician Assistant

## 2021-06-17 ENCOUNTER — Other Ambulatory Visit: Payer: Self-pay | Admitting: Medical Oncology

## 2021-06-17 DIAGNOSIS — C3490 Malignant neoplasm of unspecified part of unspecified bronchus or lung: Secondary | ICD-10-CM

## 2021-06-18 ENCOUNTER — Inpatient Hospital Stay: Payer: Medicare HMO

## 2021-06-18 ENCOUNTER — Inpatient Hospital Stay: Payer: Medicare HMO | Attending: Internal Medicine | Admitting: Internal Medicine

## 2021-06-18 ENCOUNTER — Other Ambulatory Visit: Payer: Self-pay

## 2021-06-18 ENCOUNTER — Encounter: Payer: Self-pay | Admitting: Internal Medicine

## 2021-06-18 VITALS — BP 112/62 | HR 69 | Temp 97.6°F | Resp 18 | Ht 70.0 in | Wt 178.3 lb

## 2021-06-18 DIAGNOSIS — Z7982 Long term (current) use of aspirin: Secondary | ICD-10-CM | POA: Diagnosis not present

## 2021-06-18 DIAGNOSIS — Z79899 Other long term (current) drug therapy: Secondary | ICD-10-CM | POA: Insufficient documentation

## 2021-06-18 DIAGNOSIS — Z902 Acquired absence of lung [part of]: Secondary | ICD-10-CM | POA: Diagnosis not present

## 2021-06-18 DIAGNOSIS — C3490 Malignant neoplasm of unspecified part of unspecified bronchus or lung: Secondary | ICD-10-CM

## 2021-06-18 DIAGNOSIS — Z951 Presence of aortocoronary bypass graft: Secondary | ICD-10-CM | POA: Diagnosis not present

## 2021-06-18 DIAGNOSIS — C3491 Malignant neoplasm of unspecified part of right bronchus or lung: Secondary | ICD-10-CM

## 2021-06-18 DIAGNOSIS — C3411 Malignant neoplasm of upper lobe, right bronchus or lung: Secondary | ICD-10-CM | POA: Insufficient documentation

## 2021-06-18 DIAGNOSIS — Z87891 Personal history of nicotine dependence: Secondary | ICD-10-CM | POA: Insufficient documentation

## 2021-06-18 DIAGNOSIS — I2510 Atherosclerotic heart disease of native coronary artery without angina pectoris: Secondary | ICD-10-CM | POA: Insufficient documentation

## 2021-06-18 DIAGNOSIS — E785 Hyperlipidemia, unspecified: Secondary | ICD-10-CM | POA: Diagnosis not present

## 2021-06-18 LAB — CBC WITH DIFFERENTIAL (CANCER CENTER ONLY)
Abs Immature Granulocytes: 0.02 10*3/uL (ref 0.00–0.07)
Basophils Absolute: 0.1 10*3/uL (ref 0.0–0.1)
Basophils Relative: 1 %
Eosinophils Absolute: 0.4 10*3/uL (ref 0.0–0.5)
Eosinophils Relative: 6 %
HCT: 34.5 % — ABNORMAL LOW (ref 39.0–52.0)
Hemoglobin: 11.1 g/dL — ABNORMAL LOW (ref 13.0–17.0)
Immature Granulocytes: 0 %
Lymphocytes Relative: 35 %
Lymphs Abs: 2.2 10*3/uL (ref 0.7–4.0)
MCH: 30.1 pg (ref 26.0–34.0)
MCHC: 32.2 g/dL (ref 30.0–36.0)
MCV: 93.5 fL (ref 80.0–100.0)
Monocytes Absolute: 0.6 10*3/uL (ref 0.1–1.0)
Monocytes Relative: 10 %
Neutro Abs: 3 10*3/uL (ref 1.7–7.7)
Neutrophils Relative %: 48 %
Platelet Count: 209 10*3/uL (ref 150–400)
RBC: 3.69 MIL/uL — ABNORMAL LOW (ref 4.22–5.81)
RDW: 13.1 % (ref 11.5–15.5)
WBC Count: 6.2 10*3/uL (ref 4.0–10.5)
nRBC: 0 % (ref 0.0–0.2)

## 2021-06-18 LAB — CMP (CANCER CENTER ONLY)
ALT: 12 U/L (ref 0–44)
AST: 15 U/L (ref 15–41)
Albumin: 3.8 g/dL (ref 3.5–5.0)
Alkaline Phosphatase: 91 U/L (ref 38–126)
Anion gap: 7 (ref 5–15)
BUN: 20 mg/dL (ref 8–23)
CO2: 27 mmol/L (ref 22–32)
Calcium: 9.1 mg/dL (ref 8.9–10.3)
Chloride: 102 mmol/L (ref 98–111)
Creatinine: 1.32 mg/dL — ABNORMAL HIGH (ref 0.61–1.24)
GFR, Estimated: 55 mL/min — ABNORMAL LOW (ref >60)
Glucose, Bld: 89 mg/dL (ref 70–99)
Potassium: 4 mmol/L (ref 3.5–5.1)
Sodium: 136 mmol/L (ref 135–145)
Total Bilirubin: 0.7 mg/dL (ref 0.3–1.2)
Total Protein: 7.2 g/dL (ref 6.5–8.1)

## 2021-06-18 NOTE — Progress Notes (Signed)
[place] [phone]   [phone]  CONSULT NOTE  REFERRING PHYSICIAN: Dr. Melodie Bouillon  REASON FOR CONSULTATION:  81 years old white male recently diagnosed with lung cancer.  HPI Kevin [name] is a 81 y.o. male with past medical history significant for dyslipidemia, coronary artery disease status post CABG.  The patient has a history of smoking but quit 25 years ago.  He mentioned that in the middle of January he has been complaining of shortness of breath as well as burning sensation in his chest while working in the yard.  This happened 3-4 times.  He reached out to his primary care physician and initial EKG was concerning for heart disease.  The patient was referred to cardiology Dr. Einar Gip and work-up including cardiac catheterization was consistent with coronary artery disease.  The patient was seen by Dr. Kipp Brood for consideration of CABG.  Preoperative chest x-ray on May 07, 2021 showed incidental finding of 4.5 cm opacity in the right lung.  This was followed by CT scan of the chest on the same day and that showed 2 adjacent lesions in the right upper lobe with spiculated margins measuring 4.0 x 3.5 cm along the major fissure and 2.0 x 2.2 cm adjacent to the right upper lobe bronchus.  The findings were most consistent with malignancy.  There was no evidence of metastatic disease in the chest.  On May 13, 2021 the patient underwent CABG x3 in addition to core needle biopsy of the right upper lobe mass with right upper lobe wedge resection. The final pathology ([id number]) from the right upper lobe biopsy as well as the right upper lobe wedge resection showed invasive moderately differentiated adenocarcinoma, acinar predominant measuring 3.3 cm with carcinoma invades into the visceral pleural surface and the resection margin is focally less than 0.1 cm from the tumor.  There was no lymph node dissection. The patient is recovering well  from his surgery and he was referred by Dr. Kipp Brood to me today for evaluation and recommendation regarding his condition. When seen today he is feeling fine with no concerning complaints except for mild cough.  He has no chest pain, shortness of breath or hemoptysis.  He lost around 20 pounds in the last few months.  He has no nausea, vomiting, diarrhea, abdominal pain but has constipation.  He has no headache or visual changes. Family history significant for mother with Alzheimer.  Father died from congestive heart failure.  3 brother also died from heart disease 2 of them were secondary to alcoholic complication. The patient is married and has 2 children and son Hasting and daughter Amy.  He was accompanied by his wife Opal Sidles and his daughter Warren Lacy was available by FaceTime during the visit.  He used to work in [employer].  He has a history of smoking for around 30 years and quit 25 years ago.  He drinks alcohol occasionally and no history of drug abuse.   HPI  Past Medical History:  Diagnosis Date   Hyperlipidemia     Past Surgical History:  Procedure Laterality Date   CORONARY ARTERY BYPASS GRAFT N/A 05/13/2021   Procedure: CORONARY ARTERY BYPASS GRAFTING (CABG) TIMES 3  , ON PUMP, USING LEFT INTERNAL MAMMARY ARTERY AND RIGHT GREATER SAPHENOUS VEIN HARVESTED ENDOSCOPICALLY;  Surgeon: Lajuana Matte, MD;  Location: [place];  Service: Open Heart Surgery;  Laterality: N/A;   ENDOVEIN HARVEST OF GREATER SAPHENOUS VEIN Right 05/13/2021   Procedure: ENDOVEIN HARVEST OF  GREATER SAPHENOUS VEIN;  Surgeon: Lajuana Matte, MD;  Location: [place];  Service: Open Heart Surgery;  Laterality: Right;   epidural     back pain   IR CT SPINE LTD  06/18/2020   IR KYPHO LUMBAR INC FX REDUCE BONE BX UNI/BIL CANNULATION INC/IMAGING  06/18/2020   LEFT HEART CATH AND CORONARY ANGIOGRAPHY N/A 05/06/2021   Procedure: LEFT HEART CATH AND CORONARY ANGIOGRAPHY;  Surgeon: [name], MD;  Location: [facility];  Service: Cardiovascular;  Laterality: N/A;   LUNG BIOPSY Right 05/13/2021   Procedure: RIGHT LUNG BIOPSY;  Surgeon: Lajuana Matte, MD;  Location: [facility];  Service: Open Heart Surgery;  Laterality: Right;   MINOR HEMORRHOIDECTOMY     SHOULDER SURGERY     TEE WITHOUT CARDIOVERSION N/A 05/13/2021   Procedure: TRANSESOPHAGEAL ECHOCARDIOGRAM (TEE);  Surgeon: Lajuana Matte, MD;  Location: [place];  Service: Open Heart Surgery;  Laterality: N/A;   TRANSURETHRAL RESECTION OF BLADDER TUMOR  2014   TRANSURETHRAL RESECTION OF BLADDER TUMOR N/A 05/19/2018   Procedure: TRANSURETHRAL RESECTION OF BLADDER TUMOR (TURBT) AND (INTRAVESICAL GEMCITABINE INSTILLED IN BLADDER IN PACU) ;  Surgeon: Franchot Gallo, MD;  Location: [facility];  Service: Urology;  Laterality: N/A;  51 MINS    Family History  Problem Relation Age of Onset   Alzheimer's disease Mother 3   Congestive Heart Failure Father 39   Heart disease Brother 25   Heart failure Brother 100   Heart failure Brother 43    Social History Social History   Tobacco Use   Smoking status: Former    Packs/day: 1.00    Years: 30.00    Pack years: 30.00    Types: Cigarettes    Quit date: 04/14/1995    Years since quitting: 26.1   Smokeless tobacco: Never  Vaping Use   Vaping Use: Never used  Substance Use Topics   Alcohol use: Never   Drug use: Never    No Known Allergies  Current Outpatient Medications  Medication Sig Dispense Refill   aspirin EC 81 MG tablet Take 81 mg by mouth daily.     atorvastatin (LIPITOR) 80 MG tablet Take 1 tablet (80 mg total) by mouth daily. 90 tablet 3   clopidogrel (PLAVIX) 75 MG tablet Take 1 tablet (75 mg total) by mouth daily. 30 tablet 1   metoprolol tartrate (LOPRESSOR) 25 MG tablet TAKE 1 TABLET BY MOUTH TWICE A DAY 180 tablet 0   nitroGLYCERIN (NITROSTAT) 0.4 MG SL tablet Place 0.4 mg under the tongue every 5 (five) minutes as needed for chest pain.     traMADol (ULTRAM) 50 MG tablet Take 1  tablet (50 mg total) by mouth every 6 (six) hours as needed for moderate pain. 30 tablet 0   No current facility-administered medications for this visit.    Review of Systems  Constitutional: positive for fatigue and weight loss Eyes: negative Ears, nose, mouth, throat, and face: negative Respiratory: positive for cough Cardiovascular: negative Gastrointestinal: negative Genitourinary:negative Integument/breast: negative Hematologic/lymphatic: negative Musculoskeletal:negative Neurological: negative Behavioral/Psych: negative Endocrine: negative Allergic/Immunologic: negative  Physical Exam  ZOX:WRUEA, healthy, no distress, well nourished, and well developed SKIN: skin color, texture, turgor are normal, no rashes or significant lesions HEAD: Normocephalic, No masses, lesions, tenderness or abnormalities EYES: normal, PERRLA, Conjunctiva are pink and non-injected EARS: External ears normal, Canals clear OROPHARYNX:no exudate, no erythema, and lips, buccal mucosa, and tongue normal  NECK: supple, no adenopathy, no JVD LYMPH:  no palpable  lymphadenopathy, no hepatosplenomegaly LUNGS: clear to auscultation , and palpation HEART: regular rate & rhythm, no murmurs, and no gallops ABDOMEN:abdomen soft, non-tender, normal bowel sounds, and no masses or organomegaly BACK: Back symmetric, no curvature., No CVA tenderness EXTREMITIES:no joint deformities, effusion, or inflammation, no edema  NEURO: alert & oriented x 3 with fluent speech, no focal motor/sensory deficits  PERFORMANCE STATUS: ECOG 1  LABORATORY DATA: Lab Results  Component Value Date   WBC 6.2 06/18/2021   HGB 11.1 (L) 06/18/2021   HCT 34.5 (L) 06/18/2021   MCV 93.5 06/18/2021   PLT 209 06/18/2021      Chemistry      Component Value Date/Time   NA 131 (L) 05/17/2021 0132   NA 139 05/01/2021 1146   K 3.4 (L) 05/17/2021 0132   CL 98 05/17/2021 0132   CO2 24 05/17/2021 0132   BUN 17 05/17/2021 0132   BUN  20 05/01/2021 1146   CREATININE 1.33 (H) 05/17/2021 0132      Component Value Date/Time   CALCIUM 8.2 (L) 05/17/2021 0132   ALKPHOS 63 05/11/2021 1307   AST 15 05/11/2021 1307   ALT 16 05/11/2021 1307   BILITOT 1.1 05/11/2021 1307       RADIOGRAPHIC STUDIES: No results found.  ASSESSMENT: This is a very pleasant 81 years old white male diagnosed with at least stage Ib (T2 a, N0, M0) non-small cell lung cancer, adenocarcinoma presented with right upper lobe lung mass status post right upper lobe wedge resection but no lymph node dissection.  The tumor measured 3.3 cm with visceral pleural involvement and very close resection margin less than 0.1 cm from the tumor.  This was performed on May 13, 2021 in the same setting of CABG under the care of Dr. Kipp Brood.   PLAN: I had a lengthy discussion with the patient and his family today about his current disease stage, prognosis and treatment options. I personally and independently reviewed the scan images and discussed the result and showed the images to the patient and his family. I explained to the patient that because of the emergent situation her surgery was not performed on the ideal way with right upper lobectomy and lymph node dissection.  His tumor also has visceral pleural involvement and very close resection margin. I recommended for the patient to complete the staging work-up by ordering a PET scan as well as MRI of the brain to rule out any other metastatic disease. If the patient has no residual or metastatic disease, I will continue to monitor him closely with observation and repeat CT scan of the chest in around 3 months for restaging of his disease. The patient and his family are in agreement with the current plan. He was advised to call immediately if he has any other concerning symptoms in the interval.  The patient voices understanding of current disease status and treatment options and is in agreement with the current  care plan.  All questions were answered. The patient knows to call the clinic with any problems, questions or concerns. We can certainly see the patient much sooner if necessary.  Thank you so much for allowing me to participate in the care of [place]. I will continue to follow up the patient with you and assist in his care. The total time spent in the appointment was 60 minutes.  Disclaimer: This note was dictated with voice recognition software. Similar sounding words can inadvertently be transcribed and may not be corrected upon review.  Eilleen Kempf June 18, 2021, 2:18 PM

## 2021-06-24 DIAGNOSIS — M79671 Pain in right foot: Secondary | ICD-10-CM | POA: Diagnosis not present

## 2021-06-24 DIAGNOSIS — L609 Nail disorder, unspecified: Secondary | ICD-10-CM | POA: Diagnosis not present

## 2021-06-24 DIAGNOSIS — M79675 Pain in left toe(s): Secondary | ICD-10-CM | POA: Diagnosis not present

## 2021-06-24 DIAGNOSIS — B351 Tinea unguium: Secondary | ICD-10-CM | POA: Diagnosis not present

## 2021-06-24 DIAGNOSIS — M79672 Pain in left foot: Secondary | ICD-10-CM | POA: Diagnosis not present

## 2021-06-24 DIAGNOSIS — M79674 Pain in right toe(s): Secondary | ICD-10-CM | POA: Diagnosis not present

## 2021-06-25 ENCOUNTER — Other Ambulatory Visit: Payer: Self-pay | Admitting: Thoracic Surgery (Cardiothoracic Vascular Surgery)

## 2021-06-25 DIAGNOSIS — Z951 Presence of aortocoronary bypass graft: Secondary | ICD-10-CM

## 2021-06-25 DIAGNOSIS — C3491 Malignant neoplasm of unspecified part of right bronchus or lung: Secondary | ICD-10-CM

## 2021-06-27 ENCOUNTER — Ambulatory Visit
Admission: RE | Admit: 2021-06-27 | Discharge: 2021-06-27 | Disposition: A | Discharge status: Home / Self Care | Payer: Medicare HMO | Source: Ambulatory Visit | Attending: Thoracic Surgery (Cardiothoracic Vascular Surgery) | Admitting: Thoracic Surgery (Cardiothoracic Vascular Surgery)

## 2021-06-27 ENCOUNTER — Ambulatory Visit (INDEPENDENT_AMBULATORY_CARE_PROVIDER_SITE_OTHER): Payer: Self-pay | Admitting: Thoracic Surgery (Cardiothoracic Vascular Surgery)

## 2021-06-27 ENCOUNTER — Other Ambulatory Visit: Payer: Self-pay

## 2021-06-27 VITALS — BP 140/73 | HR 73 | Resp 20 | Ht 70.0 in | Wt 180.0 lb

## 2021-06-27 DIAGNOSIS — C3491 Malignant neoplasm of unspecified part of right bronchus or lung: Secondary | ICD-10-CM

## 2021-06-27 DIAGNOSIS — Z951 Presence of aortocoronary bypass graft: Secondary | ICD-10-CM

## 2021-06-27 DIAGNOSIS — J90 Pleural effusion, not elsewhere classified: Secondary | ICD-10-CM | POA: Diagnosis not present

## 2021-06-27 DIAGNOSIS — I2510 Atherosclerotic heart disease of native coronary artery without angina pectoris: Secondary | ICD-10-CM

## 2021-06-27 DIAGNOSIS — J929 Pleural plaque without asbestos: Secondary | ICD-10-CM | POA: Diagnosis not present

## 2021-06-27 DIAGNOSIS — C3411 Malignant neoplasm of upper lobe, right bronchus or lung: Secondary | ICD-10-CM | POA: Diagnosis not present

## 2021-06-27 NOTE — Progress Notes (Signed)
? ?   ?  [address] ?      [place] [address] ?            [phone]       ? ?Kevin [name] ?[facility] Record #[id number] ?Date of Birth: 1940-10-31 ? ?Referring: Kevin Prows, MD ?Primary Care: Kevin Chroman, MD ?Primary Cardiologist:None ? ?Reason for visit:   follow-up ? ?History of Present Illness:     ?81 year old male presents for his 1 month follow-up appointment after undergoing a coronary artery bypass grafting and wedge resection of a right upper lobe lung cancer.  Overall he is doing well. ? ?Physical Exam: ?BP 140/73   Pulse 73   Resp 20   Ht $R'5\' 10"'QG$  (1.778 m)   Wt 180 lb (81.6 kg)   SpO2 95% Comment: RA  BMI 25.83 kg/m?  ? ?Alert NAD ?Incision clean.  Sternum stable ?Abdomen, ND ?No peripheral edema ? ? ?Diagnostic Studies & Laboratory data: ?CXR: Clear ? ?  ? ?Assessment / Plan:   ?81 year old male status post CABG and wedge resection of right upper lobe lung cancer.  He is already met with Dr. Julien [name] for oncologic evaluation.  From a cardiac standpoint he is cleared to resume all activity.  I will follow-up on his lung cancer evaluation. ? ? ?Kevin [name] ?06/27/2021 12:38 PM ? ? ? ? ? ? ?

## 2021-07-07 ENCOUNTER — Other Ambulatory Visit: Payer: Self-pay

## 2021-07-07 ENCOUNTER — Ambulatory Visit (HOSPITAL_COMMUNITY)
Admission: RE | Admit: 2021-07-07 | Discharge: 2021-07-07 | Disposition: A | Discharge status: Home / Self Care | Payer: Medicare HMO | Source: Ambulatory Visit | Attending: Internal Medicine | Admitting: Internal Medicine

## 2021-07-07 DIAGNOSIS — C3490 Malignant neoplasm of unspecified part of unspecified bronchus or lung: Secondary | ICD-10-CM | POA: Insufficient documentation

## 2021-07-07 DIAGNOSIS — I6782 Cerebral ischemia: Secondary | ICD-10-CM | POA: Diagnosis not present

## 2021-07-07 DIAGNOSIS — R911 Solitary pulmonary nodule: Secondary | ICD-10-CM | POA: Diagnosis not present

## 2021-07-07 DIAGNOSIS — G319 Degenerative disease of nervous system, unspecified: Secondary | ICD-10-CM | POA: Diagnosis not present

## 2021-07-07 LAB — GLUCOSE, CAPILLARY: Glucose-Capillary: 93 mg/dL (ref 70–99)

## 2021-07-07 MED ORDER — FLUDEOXYGLUCOSE F - 18 (FDG) INJECTION
9.0000 | Freq: Once | INTRAVENOUS | Status: AC | PRN
  Administered 2021-07-07: 8.42 via INTRAVENOUS

## 2021-07-07 MED ORDER — GADOBUTROL 1 MMOL/ML IV SOLN
8.0000 mL | Freq: Once | INTRAVENOUS | Status: AC | PRN
  Administered 2021-07-07: 8 mL via INTRAVENOUS

## 2021-07-09 ENCOUNTER — Ambulatory Visit: Payer: Medicare HMO | Admitting: Cardiology

## 2021-07-09 ENCOUNTER — Other Ambulatory Visit: Payer: Self-pay

## 2021-07-09 ENCOUNTER — Telehealth: Payer: Self-pay | Admitting: Internal Medicine

## 2021-07-09 ENCOUNTER — Encounter: Payer: Self-pay | Admitting: *Deleted

## 2021-07-09 ENCOUNTER — Other Ambulatory Visit: Payer: Self-pay | Admitting: Physician Assistant

## 2021-07-09 ENCOUNTER — Encounter: Payer: Self-pay | Admitting: Physician Assistant

## 2021-07-09 ENCOUNTER — Encounter: Payer: Self-pay | Admitting: Cardiology

## 2021-07-09 ENCOUNTER — Inpatient Hospital Stay (HOSPITAL_BASED_OUTPATIENT_CLINIC_OR_DEPARTMENT_OTHER): Payer: Medicare HMO | Admitting: Physician Assistant

## 2021-07-09 VITALS — BP 113/63 | HR 65 | Temp 98.7°F | Resp 16 | Ht 70.0 in | Wt 180.4 lb

## 2021-07-09 DIAGNOSIS — Z79899 Other long term (current) drug therapy: Secondary | ICD-10-CM | POA: Diagnosis not present

## 2021-07-09 DIAGNOSIS — C3411 Malignant neoplasm of upper lobe, right bronchus or lung: Secondary | ICD-10-CM | POA: Diagnosis not present

## 2021-07-09 DIAGNOSIS — Z951 Presence of aortocoronary bypass graft: Secondary | ICD-10-CM

## 2021-07-09 DIAGNOSIS — Z87891 Personal history of nicotine dependence: Secondary | ICD-10-CM | POA: Diagnosis not present

## 2021-07-09 DIAGNOSIS — I2510 Atherosclerotic heart disease of native coronary artery without angina pectoris: Secondary | ICD-10-CM | POA: Diagnosis not present

## 2021-07-09 DIAGNOSIS — Z7982 Long term (current) use of aspirin: Secondary | ICD-10-CM | POA: Diagnosis not present

## 2021-07-09 DIAGNOSIS — C3490 Malignant neoplasm of unspecified part of unspecified bronchus or lung: Secondary | ICD-10-CM | POA: Insufficient documentation

## 2021-07-09 DIAGNOSIS — C3491 Malignant neoplasm of unspecified part of right bronchus or lung: Secondary | ICD-10-CM | POA: Diagnosis not present

## 2021-07-09 DIAGNOSIS — Z7189 Other specified counseling: Secondary | ICD-10-CM | POA: Diagnosis not present

## 2021-07-09 DIAGNOSIS — E7800 Pure hypercholesterolemia, unspecified: Secondary | ICD-10-CM | POA: Diagnosis not present

## 2021-07-09 DIAGNOSIS — E785 Hyperlipidemia, unspecified: Secondary | ICD-10-CM | POA: Diagnosis not present

## 2021-07-09 DIAGNOSIS — Z902 Acquired absence of lung [part of]: Secondary | ICD-10-CM | POA: Diagnosis not present

## 2021-07-09 MED ORDER — ATORVASTATIN CALCIUM 80 MG PO TABS: 80.0000 mg | ORAL_TABLET | Freq: Every day | ORAL | 3 refills | Status: DC

## 2021-07-09 MED ORDER — METOPROLOL TARTRATE 25 MG PO TABS: 25.0000 mg | ORAL_TABLET | Freq: Two times a day (BID) | ORAL | 3 refills | Status: DC

## 2021-07-09 MED ORDER — CLOPIDOGREL BISULFATE 75 MG PO TABS: 75.0000 mg | ORAL_TABLET | Freq: Every day | ORAL | 2 refills | Status: DC

## 2021-07-09 NOTE — Telephone Encounter (Signed)
.  Called pt per 3/29 inbasket , Patient was unavailable, a message with appt time and date was left with number on file.  ? ?

## 2021-07-09 NOTE — Patient Instructions (Addendum)
-  There are two main categories of lung cancer, they are named based on the size of the cancer cell. One is called Non-Small cell lung cancer. The other type is Small Cell Lung Cancer ?-The sample (biopsy) that they took of your tumor was consistent with a subtype of Non-small cell lung cancer called Adenocarcinoma  ?-We covered a lot of important information at your appointment today regarding what the treatment plan is moving forward. Here are the the main points that were discussed at your office visit with [place] today:  ?-The treatment that you will receive consists of two chemotherapy drugs called Carboplatin and Paclitaxel (also called Taxol) ?-We are planning on starting your treatment next week on 07/21/21 but before your start your treatment, I would like you to attend a Chemotherapy Education Class. This involves having you sit down with one of our nurse educators. She will discuss with you one-on-one more details about your treatment as well as general information about resources here at the [facility].  ?-Your treatment will be given every week for about 6 weeks or so (as long as you are also receiving radiation). We will check your labs (blood work) once a week . Dr. Julien Nordmann or I will see you every other treatment just to make sure that you are doing well and that everything is on track. ?-We will get a CT scan about 3 weeks after you complete your treatment ? ?Medications:  ?-Compazine was sent to your pharmacy. This medication is for nausea. You may take this every 6 hours as needed if you feel nausous.  ? ?Referrals ?-I will place the referral to radiation oncology to meet with you to discuss starting radiation treatment. Please be on the lookout for a phone call from them to schedule a consultation.  ? ?Follow Up:  ?-We will see you back for a follow up visit ~4/17 before week 2 of treatment ? ?

## 2021-07-09 NOTE — Progress Notes (Signed)
[place] ?OFFICE PROGRESS NOTE ? ?Kevin Chroman, MD ?[address] ?[place] [address] ? ?DIAGNOSIS: Stage IIIIB (T3, N2, M0) non-small cell lung cancer, adenocarcinoma presented with right upper lobe lung mass status post right upper lobe wedge resection but no lymph node dissection.  The tumor measured 3.3 cm with visceral pleural involvement and very close resection margin less than 0.1 cm from the tumor.  This was performed on May 13, 2021 in the same setting of CABG under the care of Dr. Kipp Brood.  PET scan performed in March 2023 showed a right upper lobe nodule adjacent to the wedge resection site small hypermetabolic right hilar, right subcarinal, and left infrahilar lymph nodes which could be reactive but are suspicious for nodal involvement. ? ?Molecular studies are pending ? ?PRIOR THERAPY: Right upper lobe wedge resection under the care of Dr. Kipp Brood on 05/13/2021 ? ?CURRENT THERAPY: If not a surgical candidate, concurrent chemoradiation with carboplatin for an AUC of 2 and paclitaxel 45 mg per metered squared.  First dose expected on 07/21/2021 ? ?INTERVAL HISTORY: ?Kevin [name] 81 y.o. male returns to the clinic today for a follow-up visit accompanied by his wife.  The patient was first seen in clinic for consultation on 06/18/2021.  In January 2023, the patient was found to have a right upper lobe lung mass which was noted on preop x-ray before undergoing a CABG.  The patient subsequently had a CT scan.  The patient underwent CABG x3 in addition to needle core biopsy of the right upper lobe lung mass which resulted in a right upper lobe resection under the care of Dr. Kipp Brood on 05/13/2021.  The final pathology was consistent with adenocarcinoma. ? ?The patient had a PET scan performed as part of his staging work-up a few days ago on 07/08/21 which unfortunately showed a right upper lobe nodule adjacent to the wedge resection site as well as small hypermetabolic right hilar,  subcarinal, and left infrahilar lymph nodes which may be reactive but are suspicious for nodal involvement.  The patient is being seen back in the clinic today for further evaluation and discussion regarding these findings. ? ?Overall, the patient is feeling fairly well today. He is expected to start cardiac rehab in [place] tomorrow. He denies any fever, chills, night sweats.  He had some weight loss over the last few months and following his surgery but reports his appetite is coming back.  Denies shortness of breath as he admits he has not been very active.  Denies any cough, chest pain, or hemoptysis.  Denies any nausea, vomiting, diarrhea, or constipation.  Denies any headache or visual changes.  The patient is here today for evaluation and more detailed discussion about his current condition and recommended treatment options. ? ?MEDICAL HISTORY: ?Past Medical History:  ?Diagnosis Date  ? Hyperlipidemia   ? ? ?ALLERGIES:  has No Known Allergies. ? ?MEDICATIONS:  ?Current Outpatient Medications  ?Medication Sig Dispense Refill  ? aspirin EC 81 MG tablet Take 81 mg by mouth daily.    ? atorvastatin (LIPITOR) 80 MG tablet Take 1 tablet (80 mg total) by mouth daily. 90 tablet 3  ? Calcium Carb-Cholecalciferol (CALCIUM 600 + D PO) Take 1 tablet by mouth daily.    ? Cholecalciferol (DIALYVITE VITAMIN D 5000) 125 MCG (5000 UT) capsule Take 5,000 Units by mouth daily.    ? clopidogrel (PLAVIX) 75 MG tablet Take 1 tablet (75 mg total) by mouth daily. 90 tablet 2  ? metoprolol  tartrate (LOPRESSOR) 25 MG tablet Take 1 tablet (25 mg total) by mouth 2 (two) times daily. 180 tablet 3  ? nitroGLYCERIN (NITROSTAT) 0.4 MG SL tablet Place 0.4 mg under the tongue every 5 (five) minutes as needed for chest pain.    ? traZODone (DESYREL) 50 MG tablet Take 25-50 mg by mouth at bedtime as needed for sleep.    ? ?No current facility-administered medications for this visit.  ? ? ?SURGICAL HISTORY:  ?Past Surgical History:   ?Procedure Laterality Date  ? CORONARY ARTERY BYPASS GRAFT N/A 05/13/2021  ? Procedure: CORONARY ARTERY BYPASS GRAFTING (CABG) TIMES 3  , ON PUMP, USING LEFT INTERNAL MAMMARY ARTERY AND RIGHT GREATER SAPHENOUS VEIN HARVESTED ENDOSCOPICALLY;  Surgeon: Lajuana Matte, MD;  Location: [facility];  Service: Open Heart Surgery;  Laterality: N/A;  ? ENDOVEIN HARVEST OF GREATER SAPHENOUS VEIN Right 05/13/2021  ? Procedure: ENDOVEIN HARVEST OF GREATER SAPHENOUS VEIN;  Surgeon: Lajuana Matte, MD;  Location: [facility];  Service: Open Heart Surgery;  Laterality: Right;  ? epidural    ? back pain  ? IR CT SPINE LTD  06/18/2020  ? IR KYPHO LUMBAR INC FX REDUCE BONE BX UNI/BIL CANNULATION INC/IMAGING  06/18/2020  ? LEFT HEART CATH AND CORONARY ANGIOGRAPHY N/A 05/06/2021  ? Procedure: LEFT HEART CATH AND CORONARY ANGIOGRAPHY;  Surgeon: [name], MD;  Location: [facility];  Service: Cardiovascular;  Laterality: N/A;  ? LUNG BIOPSY Right 05/13/2021  ? Procedure: RIGHT LUNG BIOPSY;  Surgeon: Lajuana Matte, MD;  Location: [facility];  Service: Open Heart Surgery;  Laterality: Right;  ? MINOR HEMORRHOIDECTOMY    ? SHOULDER SURGERY    ? TEE WITHOUT CARDIOVERSION N/A 05/13/2021  ? Procedure: TRANSESOPHAGEAL ECHOCARDIOGRAM (TEE);  Surgeon: Lajuana Matte, MD;  Location: [facility];  Service: Open Heart Surgery;  Laterality: N/A;  ? TRANSURETHRAL RESECTION OF BLADDER TUMOR  2014  ? TRANSURETHRAL RESECTION OF BLADDER TUMOR N/A 05/19/2018  ? Procedure: TRANSURETHRAL RESECTION OF BLADDER TUMOR (TURBT) AND (INTRAVESICAL GEMCITABINE INSTILLED IN BLADDER IN PACU) ;  Surgeon: Franchot Gallo, MD;  Location[facility];  Service: Urology;  Laterality: N/A;  30 MINS  ? ? ?REVIEW OF SYSTEMS:   ?Review of Systems  ?Constitutional: Negative for appetite change, chills, fatigue, fever and unexpected weight change.  ?HENT: Negative for mouth sores, nosebleeds, sore throat and trouble swallowing.   ?Eyes: Negative for eye problems and icterus.   ?Respiratory: Negative for cough, hemoptysis, shortness of breath and wheezing.   ?Cardiovascular: Negative for chest pain and leg swelling.  ?Gastrointestinal: Negative for abdominal pain, constipation, diarrhea, nausea and vomiting.  ?Genitourinary: Negative for bladder incontinence, difficulty urinating, dysuria, frequency and hematuria.   ?Musculoskeletal: Negative for back pain, gait problem, neck pain and neck stiffness.  ?Skin: Negative for itching and rash.  ?Neurological: Negative for dizziness, extremity weakness, gait problem, headaches, light-headedness and seizures.  ?Hematological: Negative for adenopathy. Does not bruise/bleed easily.  ?Psychiatric/Behavioral: Negative for confusion, depression and sleep disturbance. The patient is not nervous/anxious.   ? ? ?PHYSICAL EXAMINATION:  ?There were no vitals taken for this visit. ? ?ECOG PERFORMANCE STATUS: 1 ? ?Physical Exam  ?Constitutional: Oriented to person, place, and time and well-developed, well-nourished, and in no distress.   ?HENT:  ?Head: Normocephalic and atraumatic.  ?Mouth/Throat: Oropharynx is clear and moist. No oropharyngeal exudate.  ?Eyes: Conjunctivae are normal. Right eye exhibits no discharge. Left eye exhibits no discharge. No scleral icterus.  ?Neck: Normal range of motion. Neck  supple.  ?Cardiovascular: Normal rate, regular rhythm, normal heart sounds and intact distal pulses.   ?Pulmonary/Chest: Effort normal and breath sounds normal. No respiratory distress. No wheezes. No rales.  ?Abdominal: Soft. Bowel sounds are normal. Exhibits no distension and no mass. There is no tenderness.  ?Musculoskeletal: Normal range of motion. Exhibits no edema.  ?Lymphadenopathy:  ?  No cervical adenopathy.  ?Neurological: Alert and oriented to person, place, and time. Exhibits normal muscle tone. Gait normal. Coordination normal.  ?Skin: Skin is warm and dry. No rash noted. Not diaphoretic. No erythema. No pallor.  ?Psychiatric: Mood, memory and  judgment normal.  ?Vitals reviewed. ? ?LABORATORY DATA: ?Lab Results  ?Component Value Date  ? WBC 6.2 06/18/2021  ? HGB 11.1 (L) 06/18/2021  ? HCT 34.5 (L) 06/18/2021  ? MCV 93.5 06/18/2021  ? PLT 209 06/18/2021  ? ? ?

## 2021-07-09 NOTE — Progress Notes (Signed)
? ?Primary Physician/Referring:  [name], MD ? ?Patient ID: Kevin [name], male    DOB: 08/21/1940, 81 y.o.   MRN: [id number] ? ?Chief Complaint  ?Patient presents with  ? Coronary Artery Disease  ? ?HPI:   ? ?Kevin [name]  is a 80 y.o. Caucasian male with history of hyperlipidemia, 30-pack-year history of smoking quit in 1987, who was referred to our office for evaluation of chest pain and abnormal EKG.  Patient underwent cardiac catheterization 05/01/2021 revealing multivessel disease including critical left main disease, subsequently underwent CABG x3 on 05/13/2021.  During admission patient diagnosed with right upper lung adenocarcinoma, and underwent lung wedge resection. He is following with CT surgery and oncology for further management.   ? ?He remains asymptomatic.  Patient now present for 3 month follow up.  States that he is doing very well, he has not had any shortness of breath, no chest pain.  He has resumed most of his activities at home without any significant discomfort.  He is being extremely careful with regard to his diet. ? ?Past Medical History:  ?Diagnosis Date  ? Hyperlipidemia   ? ? ?Social History  ? ?Tobacco Use  ? Smoking status: Former  ?  Packs/day: 1.00  ?  Years: 30.00  ?  Pack years: 30.00  ?  Types: Cigarettes  ?  Quit date: 04/14/1995  ?  Years since quitting: 26.2  ? Smokeless tobacco: Never  ?Substance Use Topics  ? Alcohol use: Never  ? ?Marital Status: Married  ?ROS  ?Review of Systems  ?Cardiovascular:  Negative for chest pain, dyspnea on exertion and leg swelling.  ?Gastrointestinal:  Negative for melena.  ? ?Objective  ?Blood pressure 113/63, pulse 65, temperature 98.7 ?F (37.1 ?C), temperature source Temporal, resp. rate 16, height 5\' 10"  (1.778 m), weight 180 lb 6.4 oz (81.8 kg), SpO2 98 %. Body mass index is 25.88 kg/m?.  ? ?  07/09/2021  ?  2:36 PM 06/27/2021  ? 11:11 AM 06/18/2021  ?  2:06 PM  ?Vitals with BMI  ?Height 5\' 10"  5\' 10"  5\' 10"   ?Weight 180 lbs 6 oz 180 lbs  178 lbs 5 oz  ?BMI 25.88 25.83 25.58  ?Systolic 409 811 914  ?Diastolic 63 73 62  ?Pulse 65 73 69  ?  ?Physical Exam ?Neck:  ?   Vascular: No carotid bruit or JVD.  ?Cardiovascular:  ?   Rate and Rhythm: Normal rate and regular rhythm.  ?   Pulses: Intact distal pulses.  ?   Heart sounds: Normal heart sounds. No murmur heard. ?  No gallop.  ?Pulmonary:  ?   Effort: Pulmonary effort is normal.  ?   Breath sounds: Normal breath sounds.  ?Abdominal:  ?   General: Bowel sounds are normal.  ?   Palpations: Abdomen is soft.  ?Musculoskeletal:     ?   General: No swelling.  ?  ?Laboratory examination:  ? ? ?  Latest Ref Rng & Units 06/18/2021  ?  1:51 PM 05/17/2021  ?  1:32 AM 05/16/2021  ?  2:30 AM  ?CMP  ?Glucose 70 - 99 mg/dL 89   96   113    ?BUN 8 - 23 mg/dL 20   17   21     ?Creatinine 0.61 - 1.24 mg/dL 1.32   1.33   1.33    ?Sodium 135 - 145 mmol/L 136   131   131    ?Potassium 3.5 - 5.1 mmol/L 4.0  3.4   4.0    ?Chloride 98 - 111 mmol/L 102   98   97    ?CO2 22 - 32 mmol/L 27   24   25     ?Calcium 8.9 - 10.3 mg/dL 9.1   8.2   8.3    ?Total Protein 6.5 - 8.1 g/dL 7.2      ?Total Bilirubin 0.3 - 1.2 mg/dL 0.7      ?Alkaline Phos 38 - 126 U/L 91      ?AST 15 - 41 U/L 15      ?ALT 0 - 44 U/L 12      ? ? ?  Latest Ref Rng & Units 06/18/2021  ?  1:51 PM 05/16/2021  ?  2:30 AM 05/15/2021  ?  4:25 AM  ?CBC  ?WBC 4.0 - 10.5 K/uL 6.2   9.8   11.7    ?Hemoglobin 13.0 - 17.0 g/dL 11.1   9.7   9.5    ?Hematocrit 39.0 - 52.0 % 34.5   28.7   28.6    ?Platelets 150 - 400 K/uL 209   142   142    ? ? ?Lipid Panel  ?   ?Component Value Date/Time  ? LDLDIRECT 80 05/01/2021 1146  ? ?HEMOGLOBIN A1C ?Lab Results  ?Component Value Date  ? HGBA1C 5.2 05/13/2021  ? MPG 102.54 05/13/2021  ? ?TSH ?Recent Labs  ?  05/07/21 ?0915  ?TSH 3.714  ? ?External labs:  ? ?07/05/2020: ?Total cholesterol 149, triglycerides 134, HDL 40, LDL 85. ? ?Allergies  ?No Known Allergies  ? ?Medication list after today's encounter  ? ? ?Current Outpatient Medications:  ?   aspirin EC 81 MG tablet, Take 81 mg by mouth daily., Disp: , Rfl:  ?  Calcium Carb-Cholecalciferol (CALCIUM 600 + D PO), Take 1 tablet by mouth daily., Disp: , Rfl:  ?  Cholecalciferol (DIALYVITE VITAMIN D 5000) 125 MCG (5000 UT) capsule, Take 5,000 Units by mouth daily., Disp: , Rfl:  ?  nitroGLYCERIN (NITROSTAT) 0.4 MG SL tablet, Place 0.4 mg under the tongue every 5 (five) minutes as needed for chest pain., Disp: , Rfl:  ?  traZODone (DESYREL) 50 MG tablet, Take 25-50 mg by mouth at bedtime as needed for sleep., Disp: , Rfl:  ?  atorvastatin (LIPITOR) 80 MG tablet, Take 1 tablet (80 mg total) by mouth daily., Disp: 90 tablet, Rfl: 3 ?  clopidogrel (PLAVIX) 75 MG tablet, Take 1 tablet (75 mg total) by mouth daily., Disp: 90 tablet, Rfl: 2 ?  metoprolol tartrate (LOPRESSOR) 25 MG tablet, Take 1 tablet (25 mg total) by mouth 2 (two) times daily., Disp: 180 tablet, Rfl: 3 ? ?Radiology:  ? ?CT chest 05/07/2021: ?1. Right upper lobe spiculated lesions consistent with malignancy. Multidisciplinary consult is advised. ?2. No evidence of metastatic disease. ?3. Aortic Atherosclerosis (ICD10-I70.0) and Emphysema (ICD10-J43.9). ? ?Chest x-ray 05/16/2021: ?1. Post removal of chest support tubes and RIGHT IJ central venous line. No visible pneumothorax. ?2. Improved aeration in the chest with trace LEFT effusion. ?3. Diminished opacity in the RIGHT upper lobe adjacent to surgical changes at the site of previous mass may reflect resolving pleural fluid and or atelectasis in this area, close attention on follow-up. ? ?Cardiac Studies:  ? ?Left Heart Catheterization 05/06/21:  ?LV: 126/4, EDP 11 mmHg.  Ao 123/46, mean 33 mmHg.  No pressure gradient across aortic valve. ?LVEF: 40 to 45% with global hypokinesis.  LV is mildly dilated. ?LM: High-grade, complex,  calcified 95% distal left main stenosis. ?LAD: Proximal segment mild to moderately calcified.  Proximal to mid 60% calcific stenosis.  Gives origin to 3 large diagonals and a  fourth large diagonal from the mid to distal segment, mild disease.  No significant calcification mid to distal LAD. ?CX: Gives origin to high OM1 which is got mild disease, after that there is a 60% calcific focal stenosis.  Mid to distal circumflex is large, dominant with very mild noncalcific disease. ?RCA: Nondominant, normal. ? ? ?Echocardiogram 05/07/2021: ? 1. Left ventricular ejection fraction, by estimation, is 40 to 45%. The left ventricle has mildly decreased function. The left ventricle demonstrates global hypokinesis. Left ventricular diastolic parameters were normal.  ? 2. Right ventricular systolic function is normal. The right ventricular size is normal.  ? 3. The mitral valve is normal in structure. Trivial mitral valve regurgitation. No evidence of mitral stenosis.  ? 4. The aortic valve is normal in structure. Aortic valve regurgitation is not visualized. No aortic stenosis is present. ? ?CABG x3 05/13/2021:  ?LIMA to LAD, reverse SVG to OM- 1 and left PDA. ?Core needle biopsy of the right upper lobe mass unsuccessful and hence underwent right upper lobe wedge resection ? ?EKG:  ? ?EKG 05/28/2021: Normal sinus rhythm at rate of 68 bpm, normal axis, LVH with repolarization abnormality, cannot exclude inferior lateral ischemia.  PAC (3).  ? ?Assessment  ? ?  ICD-10-CM   ?1. Coronary artery disease involving native coronary artery of native heart without angina pectoris  I25.10 clopidogrel (PLAVIX) 75 MG tablet  ?  metoprolol tartrate (LOPRESSOR) 25 MG tablet  ?  ?2. CABG x3 05/13/2021: LIMA to LAD, reverse SVG to OM- 1 and left PDA.  Z95.1   ?  ?3. Hypercholesteremia  E78.00 atorvastatin (LIPITOR) 80 MG tablet  ?  ?  ?Medications Discontinued During This Encounter  ?Medication Reason  ? atorvastatin (LIPITOR) 80 MG tablet Reorder  ? clopidogrel (PLAVIX) 75 MG tablet Reorder  ? metoprolol tartrate (LOPRESSOR) 25 MG tablet Reorder  ? ?  ?Meds ordered this encounter  ?Medications  ? atorvastatin (LIPITOR)  80 MG tablet  ?  Sig: Take 1 tablet (80 mg total) by mouth daily.  ?  Dispense:  90 tablet  ?  Refill:  3  ? clopidogrel (PLAVIX) 75 MG tablet  ?  Sig: Take 1 tablet (75 mg total) by mouth daily.  ?  Dispen

## 2021-07-09 NOTE — Progress Notes (Signed)
Oncology Nurse Navigator Documentation ? ? ?  07/09/2021  ?  9:32 AM 07/09/2021  ?  9:00 AM  ?Oncology Nurse Navigator Flowsheets  ?Abnormal Finding Date 05/07/2021 05/07/2021  ?Confirmed Diagnosis Date 05/13/2021 05/13/2021  ?Diagnosis Status  Confirmed Diagnosis Complete  ?Planned Course of Treatment  Surgery  ?Phase of Treatment  Surgery  ?Surgery Actual Start Date:  05/13/2021  ?Navigator Follow Up Date:  07/10/2021  ?Navigator Follow Up Reason:  Follow-up Appointment  ?Navigator Location  [facility]  ?Navigator Encounter Type Appt/Treatment Plan Review Appt/Treatment Plan Review  ?Treatment Initiated Date 05/13/2021 05/13/2021  ?Patient Visit Type Other Other  ?Treatment Phase Other Other  ?Barriers/Navigation Needs Coordination of Care/per Dr. Julien Nordmann, I notified pathology team to send tissue from surgery on 05/13/21 for molecular and PDL 1 testing to [facility].  Coordination of Care  ?Interventions Coordination of Care Coordination of Care  ?Acuity Level 2-Minimal Needs (1-2 Barriers Identified) Level 2-Minimal Needs (1-2 Barriers Identified)  ?Coordination of Care Appts Appts  ?Time Spent with Patient 30 30  ?  ?

## 2021-07-10 ENCOUNTER — Other Ambulatory Visit: Payer: Self-pay | Admitting: Physician Assistant

## 2021-07-10 ENCOUNTER — Encounter (HOSPITAL_COMMUNITY)
Admission: RE | Admit: 2021-07-10 | Discharge: 2021-07-10 | Disposition: A | Discharge status: Home / Self Care | Payer: Medicare HMO | Source: Ambulatory Visit | Attending: Cardiology | Admitting: Cardiology

## 2021-07-10 ENCOUNTER — Ambulatory Visit: Payer: Medicare HMO | Admitting: Internal Medicine

## 2021-07-10 ENCOUNTER — Encounter (HOSPITAL_COMMUNITY): Payer: Self-pay

## 2021-07-10 VITALS — BP 122/54 | HR 68 | Ht 70.0 in | Wt 180.1 lb

## 2021-07-10 DIAGNOSIS — Z1339 Encounter for screening examination for other mental health and behavioral disorders: Secondary | ICD-10-CM | POA: Diagnosis not present

## 2021-07-10 DIAGNOSIS — C3491 Malignant neoplasm of unspecified part of right bronchus or lung: Secondary | ICD-10-CM

## 2021-07-10 DIAGNOSIS — Z951 Presence of aortocoronary bypass graft: Secondary | ICD-10-CM | POA: Insufficient documentation

## 2021-07-10 DIAGNOSIS — Z299 Encounter for prophylactic measures, unspecified: Secondary | ICD-10-CM | POA: Diagnosis not present

## 2021-07-10 DIAGNOSIS — Z1331 Encounter for screening for depression: Secondary | ICD-10-CM | POA: Diagnosis not present

## 2021-07-10 DIAGNOSIS — Z0000 Encounter for general adult medical examination without abnormal findings: Secondary | ICD-10-CM | POA: Diagnosis not present

## 2021-07-10 DIAGNOSIS — Z7189 Other specified counseling: Secondary | ICD-10-CM | POA: Diagnosis not present

## 2021-07-10 DIAGNOSIS — Z6825 Body mass index (BMI) 25.0-25.9, adult: Secondary | ICD-10-CM | POA: Diagnosis not present

## 2021-07-10 NOTE — Progress Notes (Signed)
This patient's case was discussed at [place] this morning. Recommend bronch/ebus to evaluate lymph nodes before determining next steps in treatment. Will place referral to pulmonology to Dr. Valeta Harms. Will arrange follow up at our office in about 2 weeks or so to review results and next steps. Have cancelled referral to rad onc. Will call patient today to review this plan.  ?

## 2021-07-10 NOTE — Progress Notes (Signed)
Cardiac Individual Treatment Plan ? ?Patient Details  ?Name: Kevin [name] ?MRN: [id number] ?Date of Birth: [birth year] ?Referring Provider:   ?Flowsheet Row CARDIAC REHAB PHASE II ORIENTATION from 07/10/2021 in [place]  ?Referring Provider Dr. Kipp Brood  ? ?  ? ? ?Initial Encounter Date:  ?Flowsheet Row CARDIAC REHAB PHASE II ORIENTATION from 07/10/2021 in [place]  ?Date 07/10/21  ? ?  ? ? ?Visit Diagnosis: S/P CABG x 3 ? ?Patient's Home Medications on Admission: ? ?Current Outpatient Medications:  ?  aspirin EC 81 MG tablet, Take 81 mg by mouth daily., Disp: , Rfl:  ?  Calcium Carb-Cholecalciferol (CALCIUM 600 + D PO), Take 1 tablet by mouth daily., Disp: , Rfl:  ?  Cholecalciferol (DIALYVITE VITAMIN D 5000) 125 MCG (5000 UT) capsule, Take 5,000 Units by mouth daily., Disp: , Rfl:  ?  nitroGLYCERIN (NITROSTAT) 0.4 MG SL tablet, Place 0.4 mg under the tongue every 5 (five) minutes as needed for chest pain., Disp: , Rfl:  ?  traZODone (DESYREL) 50 MG tablet, Take 25-50 mg by mouth at bedtime as needed for sleep., Disp: , Rfl:  ?  atorvastatin (LIPITOR) 80 MG tablet, Take 1 tablet (80 mg total) by mouth daily., Disp: 90 tablet, Rfl: 3 ?  clopidogrel (PLAVIX) 75 MG tablet, Take 1 tablet (75 mg total) by mouth daily., Disp: 90 tablet, Rfl: 2 ?  metoprolol tartrate (LOPRESSOR) 25 MG tablet, Take 1 tablet (25 mg total) by mouth 2 (two) times daily., Disp: 180 tablet, Rfl: 3 ? ?Past Medical History: ?Past Medical History:  ?Diagnosis Date  ? Hyperlipidemia   ? ? ?Tobacco Use: ?Social History  ? ?Tobacco Use  ?Smoking Status Former  ? Packs/day: 1.00  ? Years: 30.00  ? Pack years: 30.00  ? Types: Cigarettes  ? Quit date: 04/14/1995  ? Years since quitting: 26.2  ?Smokeless Tobacco Never  ? ? ?Labs: ?Review Flowsheet   ? ?  ?  Latest Ref Rng & Units 05/01/2021 05/12/2021 05/13/2021  ?Labs for ITP Cardiac and Pulmonary Rehab  ?Direct LDL 0 - 99 mg/dL 80      ?Hemoglobin A1c 4.8 -  5.6 %   5.2    ?PH, Arterial 7.350 - 7.450  7.409   7.340    ? 7.343    ? 7.324    ? 7.300    ? 7.311    ? 7.418    ? 7.441    ? 7.454    ? 7.388    ? 7.338    ?PCO2 arterial 32.0 - 48.0 mmHg  38.6   39.1    ? 36.4    ? 36.3    ? 47.5    ? 45.4    ? 38.3    ? 37.9    ? 40.7    ? 44.8    ? 45.6    ?Bicarbonate 20.0 - 28.0 mmol/L  23.9   21.0    ? 19.7    ? 18.9    ? 23.3    ? 22.9    ? 24.7    ? 25.8    ? 28.6    ? 22.7    ? 27.0    ? 24.5    ?TCO2 22 - 32 mmol/L   22    ? 21    ? 20    ? 25    ? 24    ? 25    ? 27    ?  26    ? 27    ? 30    ? 29    ? 24    ? 28    ? 27    ? 26    ? 27    ?Acid-base deficit 0.0 - 2.0 mmol/L  0.1   4.0    ? 5.0    ? 7.0    ? 3.0    ? 3.0    ? 3.0    ? 2.0    ?O2 Saturation %  94.6   95.0    ? 95.0    ? 95.0    ? 94.0    ? 96.0    ? 100.0    ? 100.0    ? 100.0    ? 84.0    ? 100.0    ? 100.0    ?  ? ? Multiple values from one day are sorted in reverse-chronological order  ?  ?  ? ? ?Capillary Blood Glucose: ?Lab Results  ?Component Value Date  ? GLUCAP 93 07/07/2021  ? GLUCAP 125 (H) 05/15/2021  ? GLUCAP 131 (H) 05/15/2021  ? GLUCAP 115 (H) 05/15/2021  ? GLUCAP 134 (H) 05/14/2021  ? ? ? ?Exercise Target Goals: ?Exercise Program Goal: ?Individual exercise prescription set using results from initial 6 min walk test and THRR while considering  patient?s activity barriers and safety.  ? ?Exercise Prescription Goal: ?Starting with aerobic activity 30 plus minutes a day, 3 days per week for initial exercise prescription. Provide home exercise prescription and guidelines that participant acknowledges understanding prior to discharge. ? ?Activity Barriers & Risk Stratification: ? Activity Barriers & Cardiac Risk Stratification - 07/10/21 1256   ? ?  ? Activity Barriers & Cardiac Risk Stratification  ? Activity Barriers None   ? Cardiac Risk Stratification High   ? ?  ?  ? ?  ? ? ?6 Minute Walk: ? 6 Minute Walk   ? ? [place] Name 07/10/21 1413  ?  ?  ?  ? 6 Minute Walk  ? Phase Initial    ?  Distance 1400 feet    ? Walk Time 6 minutes    ? # of Rest Breaks 0    ? MPH 2.65    ? METS 2.57    ? RPE 12    ? VO2 Peak 9    ? Symptoms No    ? Resting HR 68 bpm    ? Resting BP 122/54    ? Resting Oxygen Saturation  97 %    ? Exercise Oxygen Saturation  during 6 min walk 97 %    ? Max Ex. HR 78 bpm    ? Max Ex. BP 130/60    ? 2 Minute Post BP 118/58    ? ?  ?  ? ?  ? ? ?Oxygen Initial Assessment: ? ? ?Oxygen Re-Evaluation: ? ? ?Oxygen Discharge (Final Oxygen Re-Evaluation): ? ? ?Initial Exercise Prescription: ? Initial Exercise Prescription - 07/10/21 1400   ? ?  ? Date of Initial Exercise RX and Referring Provider  ? Date 07/10/21   ? Referring Provider Dr. Kipp Brood   ?  ? Treadmill  ? MPH 1.6   ? Grade 0   ? Minutes 17   ?  ? Recumbant Elliptical  ? Level 1   ? RPM 60   ? Minutes 22   ?  ? Prescription Details  ? Frequency (times per week) 3   ?  Duration Progress to 30 minutes of continuous aerobic without signs/symptoms of physical distress   ?  ? Intensity  ? THRR 40-80% of Max Heartrate 57-114   ? Ratings of Perceived Exertion 11-13   ?  ? Resistance Training  ? Training Prescription Yes   ? Weight 3   ? Reps 10-15   ? ?  ?  ? ?  ? ? ?Perform Capillary Blood Glucose checks as needed. ? ?Exercise Prescription Changes: ? ? ?Exercise Comments: ? ? ?Exercise Goals and Review: ? ? Exercise Goals   ? ? [place] Name 07/10/21 1417  ?  ?  ?  ?  ?  ? Exercise Goals  ? Increase Physical Activity Yes      ? Intervention Provide advice, education, support and counseling about physical activity/exercise needs.;Develop an individualized exercise prescription for aerobic and resistive training based on initial evaluation findings, risk stratification, comorbidities and participant's personal goals.      ? Expected Outcomes Short Term: Attend rehab on a regular basis to increase amount of physical activity.;Long Term: Add in home exercise to make exercise part of routine and to increase amount of physical activity.;Long Term:  Exercising regularly at least 3-5 days a week.      ? Increase Strength and Stamina Yes      ? Intervention Provide advice, education, support and counseling about physical activity/exercise needs.;Develop an individualized exercise prescription for aerobic and resistive training based on initial evaluation findings, risk stratification, comorbidities and participant's personal goals.      ? Expected Outcomes Short Term: Increase workloads from initial exercise prescription for resistance, speed, and METs.;Short Term: Perform resistance training exercises routinely during rehab and add in resistance training at home;Long Term: Improve cardiorespiratory fitness, muscular endurance and strength as measured by increased METs and functional capacity (6MWT)      ? Able to understand and use rate of perceived exertion (RPE) scale Yes      ? Intervention Provide education and explanation on how to use RPE scale      ? Expected Outcomes Short Term: Able to use RPE daily in rehab to express subjective intensity level;Long Term:  Able to use RPE to guide intensity level when exercising independently      ? Knowledge and understanding of Target Heart Rate Range (THRR) Yes      ? Intervention Provide education and explanation of THRR including how the numbers were predicted and where they are located for reference      ? Expected Outcomes Short Term: Able to state/look up THRR;Long Term: Able to use THRR to govern intensity when exercising independently;Short Term: Able to use daily as guideline for intensity in rehab      ? Able to check pulse independently Yes      ? Intervention Provide education and demonstration on how to check pulse in carotid and radial arteries.;Review the importance of being able to check your own pulse for safety during independent exercise      ? Expected Outcomes Short Term: Able to explain why pulse checking is important during independent exercise;Long Term: Able to check pulse independently and  accurately      ? Understanding of Exercise Prescription Yes      ? Intervention Provide education, explanation, and written materials on patient's individual exercise prescription      ? Expected Outcomes [place]

## 2021-07-11 ENCOUNTER — Encounter: Payer: Self-pay | Admitting: *Deleted

## 2021-07-11 NOTE — Progress Notes (Signed)
Oncology Nurse Navigator Documentation ? ? ?  07/11/2021  ? 10:00 AM 07/09/2021  ?  9:32 AM 07/09/2021  ?  9:00 AM  ?Oncology Nurse Navigator Flowsheets  ?Abnormal Finding Date  05/07/2021 05/07/2021  ?Confirmed Diagnosis Date  05/13/2021 05/13/2021  ?Diagnosis Status   Confirmed Diagnosis Complete  ?Planned Course of Treatment   Surgery  ?Phase of Treatment   Surgery  ?Surgery Actual Start Date:   05/13/2021  ?Navigator Follow Up Date: 07/15/2021  07/10/2021  ?Navigator Follow Up Reason: Appointment Review  Follow-up Appointment  ?Navigator Location [facility]  [facility]  ?Navigator Encounter Type Appt/Treatment Plan Review Appt/Treatment Plan Review Appt/Treatment Plan Review  ?Treatment Initiated Date  05/13/2021 05/13/2021  ?Patient Visit Type  Other Other  ?Treatment Phase  Other Other  ?Barriers/Navigation Needs Coordination of Care/I I followed up on Kevin [name]'s appt with pulmonary.  He is set up to be seen next week.  Will follow up on Dr. Juline Patch recommendations.  Coordination of Care Coordination of Care  ?Interventions Coordination of Care Coordination of Care Coordination of Care  ?Acuity Level 2-Minimal Needs (1-2 Barriers Identified) Level 2-Minimal Needs (1-2 Barriers Identified) Level 2-Minimal Needs (1-2 Barriers Identified)  ?Coordination of Care Other Appts Appts  ?Time Spent with Patient 15 30 30   ?  ?

## 2021-07-14 ENCOUNTER — Other Ambulatory Visit: Payer: Self-pay

## 2021-07-14 ENCOUNTER — Encounter: Payer: Self-pay | Admitting: Pulmonary Disease

## 2021-07-14 ENCOUNTER — Ambulatory Visit (INDEPENDENT_AMBULATORY_CARE_PROVIDER_SITE_OTHER): Payer: Medicare HMO | Admitting: Pulmonary Disease

## 2021-07-14 ENCOUNTER — Encounter: Payer: Self-pay | Admitting: Emergency Medicine

## 2021-07-14 VITALS — BP 128/56 | HR 65 | Temp 97.7°F | Ht 70.0 in | Wt 182.0 lb

## 2021-07-14 DIAGNOSIS — Z951 Presence of aortocoronary bypass graft: Secondary | ICD-10-CM | POA: Diagnosis not present

## 2021-07-14 DIAGNOSIS — R599 Enlarged lymph nodes, unspecified: Secondary | ICD-10-CM

## 2021-07-14 DIAGNOSIS — C3491 Malignant neoplasm of unspecified part of right bronchus or lung: Secondary | ICD-10-CM | POA: Diagnosis not present

## 2021-07-14 MED ORDER — PROCHLORPERAZINE MALEATE 10 MG PO TABS: 10.0000 mg | ORAL_TABLET | Freq: Four times a day (QID) | ORAL | 0 refills | Status: DC | PRN

## 2021-07-14 NOTE — H&P (View-Only) (Signed)
? ?Synopsis: Referred in April 2023 for adenopathy by Heilingoetter, [name]* ? ?Subjective:  ? ?PATIENT ID: Kevin [name] GENDER: male DOB: [birth year], MRN: [id number] ? ?Chief Complaint  ?Patient presents with  ? Consult  ?  Consult.   ? ? ?This is a 81 year old gentleman, Past medical history of coronary artery bypass grafting by Dr. Kipp Brood, was found to have a lung mass at the time of his surgical evaluation.  He had a Wedge resection of the right upper lobe which was consistent with malignancy.  Patient has establish care with medical oncology.  Last seen in the office on 07/09/2021, medical oncology office note reviewed.  Patient diagnosed with a stage IIIb T3, N2M0 non-small cell lung cancer, adenocarcinoma of the right upper lobe tumor at 3.3 cm with visceral pleural involvement.  PET scan concerning for nodal disease.  There was multidisciplinary discussion regarding his case last week at medical thoracic oncology conference.  Nodal disease potentially within the mediastinum and hilum but also possibly considered reactive.  At this changes the plan for consideration for radiation treatment which could be definitive to the upper lobe lesion versus getting more mediastinal radiation.  Radiation oncology was concerned about giving treatment to the mediastinum with a recent sternotomy.  However the patient is still on Plavix.  Patient was referred to me for consideration of videobronchoscope with endobronchial ultrasound of the small lymph nodes to confirm nodal disease. ? ? ?Past Medical History:  ?Diagnosis Date  ? Hyperlipidemia   ?  ? ?Family History  ?Problem Relation Age of Onset  ? Alzheimer's disease Mother 26  ? Congestive Heart Failure Father 53  ? Heart disease Brother 70  ? Heart failure Brother 38  ? Heart failure Brother 60  ?  ? ?Past Surgical History:  ?Procedure Laterality Date  ? CORONARY ARTERY BYPASS GRAFT N/A 05/13/2021  ? Procedure: CORONARY ARTERY BYPASS GRAFTING (CABG) TIMES 3  , ON  PUMP, USING LEFT INTERNAL MAMMARY ARTERY AND RIGHT GREATER SAPHENOUS VEIN HARVESTED ENDOSCOPICALLY;  Surgeon: Lajuana Matte, MD;  Location: [place];  Service: Open Heart Surgery;  Laterality: N/A;  ? ENDOVEIN HARVEST OF GREATER SAPHENOUS VEIN Right 05/13/2021  ? Procedure: ENDOVEIN HARVEST OF GREATER SAPHENOUS VEIN;  Surgeon: Lajuana Matte, MD;  Location: [place];  Service: Open Heart Surgery;  Laterality: Right;  ? epidural    ? back pain  ? IR CT SPINE LTD  06/18/2020  ? IR KYPHO LUMBAR INC FX REDUCE BONE BX UNI/BIL CANNULATION INC/IMAGING  06/18/2020  ? LEFT HEART CATH AND CORONARY ANGIOGRAPHY N/A 05/06/2021  ? Procedure: LEFT HEART CATH AND CORONARY ANGIOGRAPHY;  Surgeon: [name], MD;  Location: [place] [facility];  Service: Cardiovascular;  Laterality: N/A;  ? LUNG BIOPSY Right 05/13/2021  ? Procedure: RIGHT LUNG BIOPSY;  Surgeon: Lajuana Matte, MD;  Location: [place];  Service: Open Heart Surgery;  Laterality: Right;  ? MINOR HEMORRHOIDECTOMY    ? SHOULDER SURGERY    ? TEE WITHOUT CARDIOVERSION N/A 05/13/2021  ? Procedure: TRANSESOPHAGEAL ECHOCARDIOGRAM (TEE);  Surgeon: Lajuana Matte, MD;  Location: [place];  Service: Open Heart Surgery;  Laterality: N/A;  ? TRANSURETHRAL RESECTION OF BLADDER TUMOR  2014  ? TRANSURETHRAL RESECTION OF BLADDER TUMOR N/A 05/19/2018  ? Procedure: TRANSURETHRAL RESECTION OF BLADDER TUMOR (TURBT) AND (INTRAVESICAL GEMCITABINE INSTILLED IN BLADDER IN PACU) ;  Surgeon: Franchot Gallo, MD;  Location: [facility];  Service: Urology;  Laterality: N/A;  30 MINS  ? ? ?Social History  ? ?  Socioeconomic History  ? Marital status: Married  ?  Spouse name: Not on file  ? Number of children: 2  ? Years of education: Not on file  ? Highest education level: Not on file  ?Occupational History  ? Not on file  ?Tobacco Use  ? Smoking status: Former  ?  Packs/day: 1.00  ?  Years: 30.00  ?  Pack years: 30.00  ?  Types: Cigarettes  ?  Quit date: 04/14/1995  ?  Years since quitting: 26.2  ?  Smokeless tobacco: Never  ?Vaping Use  ? Vaping Use: Never used  ?Substance and Sexual Activity  ? Alcohol use: Never  ? Drug use: Never  ? Sexual activity: Not on file  ?Other Topics Concern  ? Not on file  ?Social History Narrative  ? Not on file  ? ?Social Determinants of Health  ? ?Financial Resource Strain: Not on file  ?Food Insecurity: Not on file  ?Transportation Needs: Not on file  ?Physical Activity: Not on file  ?Stress: Not on file  ?Social Connections: Not on file  ?Intimate Partner Violence: Not on file  ?  ? ?No Known Allergies  ? ?Outpatient Medications Prior to Visit  ?Medication Sig Dispense Refill  ? aspirin EC 81 MG tablet Take 81 mg by mouth daily.    ? atorvastatin (LIPITOR) 80 MG tablet Take 1 tablet (80 mg total) by mouth daily. 90 tablet 3  ? Calcium Carb-Cholecalciferol (CALCIUM 600 + D PO) Take 1 tablet by mouth daily.    ? Cholecalciferol (DIALYVITE VITAMIN D 5000) 125 MCG (5000 UT) capsule Take 5,000 Units by mouth daily.    ? clopidogrel (PLAVIX) 75 MG tablet Take 1 tablet (75 mg total) by mouth daily. 90 tablet 2  ? metoprolol tartrate (LOPRESSOR) 25 MG tablet Take 1 tablet (25 mg total) by mouth 2 (two) times daily. 180 tablet 3  ? nitroGLYCERIN (NITROSTAT) 0.4 MG SL tablet Place 0.4 mg under the tongue every 5 (five) minutes as needed for chest pain.    ? prochlorperazine (COMPAZINE) 10 MG tablet Take 1 tablet (10 mg total) by mouth every 6 (six) hours as needed for nausea or vomiting. 30 tablet 0  ? traZODone (DESYREL) 50 MG tablet Take 25-50 mg by mouth at bedtime as needed for sleep.    ? ?No facility-administered medications prior to visit.  ? ? ?Review of Systems  ?Constitutional:  Negative for chills, fever, malaise/fatigue and weight loss.  ?HENT:  Negative for hearing loss, sore throat and tinnitus.   ?Eyes:  Negative for blurred vision and double vision.  ?Respiratory:  Positive for shortness of breath. Negative for cough, hemoptysis, sputum production, wheezing and stridor.    ?Cardiovascular:  Negative for chest pain, palpitations, orthopnea, leg swelling and PND.  ?Gastrointestinal:  Negative for abdominal pain, constipation, diarrhea, heartburn, nausea and vomiting.  ?Genitourinary:  Negative for dysuria, hematuria and urgency.  ?Musculoskeletal:  Negative for joint pain and myalgias.  ?Skin:  Negative for itching and rash.  ?Neurological:  Negative for dizziness, tingling, weakness and headaches.  ?Endo/Heme/Allergies:  Negative for environmental allergies. Does not bruise/bleed easily.  ?Psychiatric/Behavioral:  Negative for depression. The patient is not nervous/anxious and does not have insomnia.   ?All other systems reviewed and are negative. ? ? ?Objective:  ?Physical Exam ? ? ?Vitals:  ? 07/14/21 1353  ?BP: (!) 128/56  ?Pulse: 65  ?Temp: 97.7 ?F (36.5 ?C)  ?TempSrc: Oral  ?SpO2: 98%  ?Weight: 182 lb (82.6 kg)  ?  Height: 5\' 10"  (1.778 m)  ? ?98% on RA ?BMI Readings from Last 3 Encounters:  ?07/14/21 26.11 kg/m?  ?07/10/21 25.84 kg/m?  ?07/09/21 25.88 kg/m?  ? ?Wt Readings from Last 3 Encounters:  ?07/14/21 182 lb (82.6 kg)  ?07/10/21 180 lb 1.9 oz (81.7 kg)  ?07/09/21 180 lb 6.4 oz (81.8 kg)  ? ? ? ?CBC ?   ?Component Value Date/Time  ? WBC 6.2 06/18/2021 1351  ? WBC 9.8 05/16/2021 0230  ? RBC 3.69 (L) 06/18/2021 1351  ? HGB 11.1 (L) 06/18/2021 1351  ? HGB 14.5 05/01/2021 1146  ? HCT 34.5 (L) 06/18/2021 1351  ? HCT 41.2 05/01/2021 1146  ? PLT 209 06/18/2021 1351  ? PLT 236 05/01/2021 1146  ? MCV 93.5 06/18/2021 1351  ? MCV 90 05/01/2021 1146  ? MCH 30.1 06/18/2021 1351  ? MCHC 32.2 06/18/2021 1351  ? RDW 13.1 06/18/2021 1351  ? RDW 11.8 05/01/2021 1146  ? LYMPHSABS 2.2 06/18/2021 1351  ? MONOABS 0.6 06/18/2021 1351  ? EOSABS 0.4 06/18/2021 1351  ? BASOSABS 0.1 06/18/2021 1351  ? ? ? ?Chest Imaging: ?07/07/2021 nuclear medicine pet imaging: ?Hypermetabolic adenopathy within the mediastinum, right upper lobe hypermetabolic lesion. ?Concerning for advanced age bronchogenic  carcinoma. ?The patient's images have been independently reviewed by me.   ? ?Pulmonary Functions Testing Results: ?   ? View : No data to display.  ?  ?  ?  ? ? ?FeNO:  ? ?Pathology:  ? ?Echocardiogram:  ? ?Heart

## 2021-07-14 NOTE — Progress Notes (Signed)
? ?Synopsis: Referred in April 2023 for adenopathy by Heilingoetter, [name]* ? ?Subjective:  ? ?PATIENT ID: Kevin [name] GENDER: male DOB: Nov 19, 1940, MRN: [id number] ? ?Chief Complaint  ?Patient presents with  ? Consult  ?  Consult.   ? ? ?This is a 81 year old gentleman, Past medical history of coronary artery bypass grafting by Dr. Kipp Brood, was found to have a lung mass at the time of his surgical evaluation.  He had a Wedge resection of the right upper lobe which was consistent with malignancy.  Patient has establish care with medical oncology.  Last seen in the office on 07/09/2021, medical oncology office note reviewed.  Patient diagnosed with a stage IIIb T3, N2M0 non-small cell lung cancer, adenocarcinoma of the right upper lobe tumor at 3.3 cm with visceral pleural involvement.  PET scan concerning for nodal disease.  There was multidisciplinary discussion regarding his case last week at medical thoracic oncology conference.  Nodal disease potentially within the mediastinum and hilum but also possibly considered reactive.  At this changes the plan for consideration for radiation treatment which could be definitive to the upper lobe lesion versus getting more mediastinal radiation.  Radiation oncology was concerned about giving treatment to the mediastinum with a recent sternotomy.  However the patient is still on Plavix.  Patient was referred to me for consideration of videobronchoscope with endobronchial ultrasound of the small lymph nodes to confirm nodal disease. ? ? ?Past Medical History:  ?Diagnosis Date  ? Hyperlipidemia   ?  ? ?Family History  ?Problem Relation Age of Onset  ? Alzheimer's disease Mother 77  ? Congestive Heart Failure Father 71  ? Heart disease Brother 47  ? Heart failure Brother 38  ? Heart failure Brother 60  ?  ? ?Past Surgical History:  ?Procedure Laterality Date  ? CORONARY ARTERY BYPASS GRAFT N/A 05/13/2021  ? Procedure: CORONARY ARTERY BYPASS GRAFTING (CABG) TIMES 3  , ON  PUMP, USING LEFT INTERNAL MAMMARY ARTERY AND RIGHT GREATER SAPHENOUS VEIN HARVESTED ENDOSCOPICALLY;  Surgeon: Lajuana Matte, MD;  Location: [place];  Service: Open Heart Surgery;  Laterality: N/A;  ? ENDOVEIN HARVEST OF GREATER SAPHENOUS VEIN Right 05/13/2021  ? Procedure: ENDOVEIN HARVEST OF GREATER SAPHENOUS VEIN;  Surgeon: Lajuana Matte, MD;  Location: [place];  Service: Open Heart Surgery;  Laterality: Right;  ? epidural    ? back pain  ? IR CT SPINE LTD  06/18/2020  ? IR KYPHO LUMBAR INC FX REDUCE BONE BX UNI/BIL CANNULATION INC/IMAGING  06/18/2020  ? LEFT HEART CATH AND CORONARY ANGIOGRAPHY N/A 05/06/2021  ? Procedure: LEFT HEART CATH AND CORONARY ANGIOGRAPHY;  Surgeon: [name], MD;  Location: [place] [facility];  Service: Cardiovascular;  Laterality: N/A;  ? LUNG BIOPSY Right 05/13/2021  ? Procedure: RIGHT LUNG BIOPSY;  Surgeon: Lajuana Matte, MD;  Location: [place];  Service: Open Heart Surgery;  Laterality: Right;  ? MINOR HEMORRHOIDECTOMY    ? SHOULDER SURGERY    ? TEE WITHOUT CARDIOVERSION N/A 05/13/2021  ? Procedure: TRANSESOPHAGEAL ECHOCARDIOGRAM (TEE);  Surgeon: Lajuana Matte, MD;  Location: [place];  Service: Open Heart Surgery;  Laterality: N/A;  ? TRANSURETHRAL RESECTION OF BLADDER TUMOR  2014  ? TRANSURETHRAL RESECTION OF BLADDER TUMOR N/A 05/19/2018  ? Procedure: TRANSURETHRAL RESECTION OF BLADDER TUMOR (TURBT) AND (INTRAVESICAL GEMCITABINE INSTILLED IN BLADDER IN PACU) ;  Surgeon: Franchot Gallo, MD;  Location: [facility];  Service: Urology;  Laterality: N/A;  30 MINS  ? ? ?Social History  ? ?  Socioeconomic History  ? Marital status: Married  ?  Spouse name: Not on file  ? Number of children: 2  ? Years of education: Not on file  ? Highest education level: Not on file  ?Occupational History  ? Not on file  ?Tobacco Use  ? Smoking status: Former  ?  Packs/day: 1.00  ?  Years: 30.00  ?  Pack years: 30.00  ?  Types: Cigarettes  ?  Quit date: 04/14/1995  ?  Years since quitting: 26.2  ?  Smokeless tobacco: Never  ?Vaping Use  ? Vaping Use: Never used  ?Substance and Sexual Activity  ? Alcohol use: Never  ? Drug use: Never  ? Sexual activity: Not on file  ?Other Topics Concern  ? Not on file  ?Social History Narrative  ? Not on file  ? ?Social Determinants of Health  ? ?Financial Resource Strain: Not on file  ?Food Insecurity: Not on file  ?Transportation Needs: Not on file  ?Physical Activity: Not on file  ?Stress: Not on file  ?Social Connections: Not on file  ?Intimate Partner Violence: Not on file  ?  ? ?No Known Allergies  ? ?Outpatient Medications Prior to Visit  ?Medication Sig Dispense Refill  ? aspirin EC 81 MG tablet Take 81 mg by mouth daily.    ? atorvastatin (LIPITOR) 80 MG tablet Take 1 tablet (80 mg total) by mouth daily. 90 tablet 3  ? Calcium Carb-Cholecalciferol (CALCIUM 600 + D PO) Take 1 tablet by mouth daily.    ? Cholecalciferol (DIALYVITE VITAMIN D 5000) 125 MCG (5000 UT) capsule Take 5,000 Units by mouth daily.    ? clopidogrel (PLAVIX) 75 MG tablet Take 1 tablet (75 mg total) by mouth daily. 90 tablet 2  ? metoprolol tartrate (LOPRESSOR) 25 MG tablet Take 1 tablet (25 mg total) by mouth 2 (two) times daily. 180 tablet 3  ? nitroGLYCERIN (NITROSTAT) 0.4 MG SL tablet Place 0.4 mg under the tongue every 5 (five) minutes as needed for chest pain.    ? prochlorperazine (COMPAZINE) 10 MG tablet Take 1 tablet (10 mg total) by mouth every 6 (six) hours as needed for nausea or vomiting. 30 tablet 0  ? traZODone (DESYREL) 50 MG tablet Take 25-50 mg by mouth at bedtime as needed for sleep.    ? ?No facility-administered medications prior to visit.  ? ? ?Review of Systems  ?Constitutional:  Negative for chills, fever, malaise/fatigue and weight loss.  ?HENT:  Negative for hearing loss, sore throat and tinnitus.   ?Eyes:  Negative for blurred vision and double vision.  ?Respiratory:  Positive for shortness of breath. Negative for cough, hemoptysis, sputum production, wheezing and stridor.    ?Cardiovascular:  Negative for chest pain, palpitations, orthopnea, leg swelling and PND.  ?Gastrointestinal:  Negative for abdominal pain, constipation, diarrhea, heartburn, nausea and vomiting.  ?Genitourinary:  Negative for dysuria, hematuria and urgency.  ?Musculoskeletal:  Negative for joint pain and myalgias.  ?Skin:  Negative for itching and rash.  ?Neurological:  Negative for dizziness, tingling, weakness and headaches.  ?Endo/Heme/Allergies:  Negative for environmental allergies. Does not bruise/bleed easily.  ?Psychiatric/Behavioral:  Negative for depression. The patient is not nervous/anxious and does not have insomnia.   ?All other systems reviewed and are negative. ? ? ?Objective:  ?Physical Exam ? ? ?Vitals:  ? 07/14/21 1353  ?BP: (!) 128/56  ?Pulse: 65  ?Temp: 97.7 ?F (36.5 ?C)  ?TempSrc: Oral  ?SpO2: 98%  ?Weight: 182 lb (82.6 kg)  ?  Height: 5\' 10"  (1.778 m)  ? ?98% on RA ?BMI Readings from Last 3 Encounters:  ?07/14/21 26.11 kg/m?  ?07/10/21 25.84 kg/m?  ?07/09/21 25.88 kg/m?  ? ?Wt Readings from Last 3 Encounters:  ?07/14/21 182 lb (82.6 kg)  ?07/10/21 180 lb 1.9 oz (81.7 kg)  ?07/09/21 180 lb 6.4 oz (81.8 kg)  ? ? ? ?CBC ?   ?Component Value Date/Time  ? WBC 6.2 06/18/2021 1351  ? WBC 9.8 05/16/2021 0230  ? RBC 3.69 (L) 06/18/2021 1351  ? HGB 11.1 (L) 06/18/2021 1351  ? HGB 14.5 05/01/2021 1146  ? HCT 34.5 (L) 06/18/2021 1351  ? HCT 41.2 05/01/2021 1146  ? PLT 209 06/18/2021 1351  ? PLT 236 05/01/2021 1146  ? MCV 93.5 06/18/2021 1351  ? MCV 90 05/01/2021 1146  ? MCH 30.1 06/18/2021 1351  ? MCHC 32.2 06/18/2021 1351  ? RDW 13.1 06/18/2021 1351  ? RDW 11.8 05/01/2021 1146  ? LYMPHSABS 2.2 06/18/2021 1351  ? MONOABS 0.6 06/18/2021 1351  ? EOSABS 0.4 06/18/2021 1351  ? BASOSABS 0.1 06/18/2021 1351  ? ? ? ?Chest Imaging: ?07/07/2021 nuclear medicine pet imaging: ?Hypermetabolic adenopathy within the mediastinum, right upper lobe hypermetabolic lesion. ?Concerning for advanced age bronchogenic  carcinoma. ?The patient's images have been independently reviewed by me.   ? ?Pulmonary Functions Testing Results: ?   ? View : No data to display.  ?  ?  ?  ? ? ?FeNO:  ? ?Pathology:  ? ?Echocardiogram:  ? ?Heart

## 2021-07-14 NOTE — Patient Instructions (Signed)
Thank you for visiting Dr. Valeta Harms at [facility]. ?Today we recommend the following: ? ?Orders Placed This Encounter  ?Procedures  ? Procedural/ Surgical Case Request: VIDEO BRONCHOSCOPY WITH ENDOBRONCHIAL ULTRASOUND  ? Ambulatory referral to Pulmonology  ? ?LAST DOSE PLAVIX 07/16/2021 ? ?Return in about 15 days (around 07/29/2021) for with Eric Form, NP or Dr. Lamonte Sakai . ? ? ? ?Please do your part to reduce the spread of COVID-19.  ?

## 2021-07-16 ENCOUNTER — Encounter: Payer: Self-pay | Admitting: *Deleted

## 2021-07-16 ENCOUNTER — Encounter (HOSPITAL_COMMUNITY): Payer: Self-pay | Admitting: Internal Medicine

## 2021-07-16 DIAGNOSIS — J069 Acute upper respiratory infection, unspecified: Secondary | ICD-10-CM | POA: Diagnosis not present

## 2021-07-16 DIAGNOSIS — Z87891 Personal history of nicotine dependence: Secondary | ICD-10-CM | POA: Diagnosis not present

## 2021-07-16 DIAGNOSIS — Z299 Encounter for prophylactic measures, unspecified: Secondary | ICD-10-CM | POA: Diagnosis not present

## 2021-07-16 NOTE — Progress Notes (Signed)
Oncology Nurse Navigator Documentation ? ? ?  07/16/2021  ?  2:00 PM 07/11/2021  ? 10:00 AM 07/09/2021  ?  9:32 AM 07/09/2021  ?  9:00 AM  ?Oncology Nurse Navigator Flowsheets  ?Abnormal Finding Date   05/07/2021 05/07/2021  ?Confirmed Diagnosis Date   05/13/2021 05/13/2021  ?Diagnosis Status    Confirmed Diagnosis Complete  ?Planned Course of Treatment    Surgery  ?Phase of Treatment    Surgery  ?Surgery Actual Start Date:    05/13/2021  ?Navigator Follow Up Date: 07/24/2021 07/15/2021  07/10/2021  ?Navigator Follow Up Reason: Appointment Review;Pathology;Review Note Appointment Review  Follow-up Appointment  ?Navigator Location [facility]  [facility]  ?Navigator Encounter Type Appt/Treatment Plan Review Appt/Treatment Plan Review Appt/Treatment Plan Review Appt/Treatment Plan Review  ?Treatment Initiated Date   05/13/2021 05/13/2021  ?Patient Visit Type   Other Other  ?Treatment Phase   Other Other  ?Barriers/Navigation Needs Coordination of Care/I followed up on patient's plan of care.  Dr. Valeta Harms is bx next week. This is scheduled for 4/11.  Will follow up on path and Dr. Juline Patch recommendations.   Coordination of Care Coordination of Care Coordination of Care  ?Interventions Coordination of Care Coordination of Care Coordination of Care Coordination of Care  ?Acuity Level 2-Minimal Needs (1-2 Barriers Identified) Level 2-Minimal Needs (1-2 Barriers Identified) Level 2-Minimal Needs (1-2 Barriers Identified) Level 2-Minimal Needs (1-2 Barriers Identified)  ?Coordination of Care Other Other Appts Appts  ?Time Spent with Patient 30 15 30 30   ?  ?

## 2021-07-18 ENCOUNTER — Encounter (HOSPITAL_COMMUNITY): Payer: Self-pay | Admitting: Emergency Medicine

## 2021-07-18 ENCOUNTER — Other Ambulatory Visit: Payer: Self-pay | Admitting: Emergency Medicine

## 2021-07-18 ENCOUNTER — Other Ambulatory Visit: Payer: Self-pay

## 2021-07-18 ENCOUNTER — Encounter: Payer: Self-pay | Admitting: *Deleted

## 2021-07-18 LAB — SARS CORONAVIRUS 2 (TAT 6-24 HRS): SARS Coronavirus 2: NEGATIVE

## 2021-07-18 NOTE — Progress Notes (Signed)
Mr. Kevin [name] denies chest pain or shortness of breath.  Patient denies having any s/s of Covid in his household.  Patient denies any known exposure to Covid.  ? ?Mr. Kevin [name] 's PCP is Dr.Dhruv  Woody Seller. ? ?Cardiologist is Dr, Einar Gip. ? ? I instructed Mr. Kevin [name] to shower with antibacteria soap.  No nail polish, artificial or acrylic nails. Wear clean clothes, brush your teeth. ?Glasses, contact lens,dentures or partials may not be worn in the OR. If you need to wear them, please bring a case for glasses, do not wear contacts or bring a case, the hospital does not have contact cases, dentures or partials will have to be removed , make sure they are clean, we will provide a denture cup to put them in. You will need some one to drive you home and a responsible person over the age of 39 to stay with you for the first 24 hours after surgery.  ?

## 2021-07-18 NOTE — Progress Notes (Signed)
Oncology Nurse Navigator Documentation ? ? ?  07/18/2021  ? 11:00 AM 07/16/2021  ?  2:00 PM 07/11/2021  ? 10:00 AM 07/09/2021  ?  9:32 AM 07/09/2021  ?  9:00 AM  ?Oncology Nurse Navigator Flowsheets  ?Abnormal Finding Date    05/07/2021 05/07/2021  ?Confirmed Diagnosis Date    05/13/2021 05/13/2021  ?Diagnosis Status     Confirmed Diagnosis Complete  ?Planned Course of Treatment     Surgery  ?Phase of Treatment     Surgery  ?Surgery Actual Start Date:     05/13/2021  ?Navigator Follow Up Date:  07/24/2021 07/15/2021  07/10/2021  ?Navigator Follow Up Reason: Pathology Appointment Review;Pathology;Review Note Appointment Review  Follow-up Appointment  ?Navigator Location [facility]  [facility]  ?Navigator Encounter Type Pathology Review Appt/Treatment Plan Review Appt/Treatment Plan Review Appt/Treatment Plan Review Appt/Treatment Plan Review  ?Treatment Initiated Date    05/13/2021 05/13/2021  ?Patient Visit Type    Other Other  ?Treatment Phase    Other Other  ?Barriers/Navigation Needs Coordination of Care/I followed up on Kevin [name] plan of care. He is set up next week for bx to check nodes.  Will follow up on path.  Coordination of Care Coordination of Care Coordination of Care Coordination of Care  ?Interventions Coordination of Care Coordination of Care Coordination of Care Coordination of Care Coordination of Care  ?Acuity Level 2-Minimal Needs (1-2 Barriers Identified) Level 2-Minimal Needs (1-2 Barriers Identified) Level 2-Minimal Needs (1-2 Barriers Identified) Level 2-Minimal Needs (1-2 Barriers Identified) Level 2-Minimal Needs (1-2 Barriers Identified)  ?Coordination of Care Pathology;Other Other Other Appts Appts  ?Time Spent with Patient 30 30 15 30 30   ?  ?

## 2021-07-19 DIAGNOSIS — C3491 Malignant neoplasm of unspecified part of right bronchus or lung: Secondary | ICD-10-CM | POA: Diagnosis not present

## 2021-07-21 ENCOUNTER — Encounter (HOSPITAL_COMMUNITY): Payer: Self-pay | Admitting: Internal Medicine

## 2021-07-22 ENCOUNTER — Other Ambulatory Visit: Payer: Self-pay

## 2021-07-22 ENCOUNTER — Ambulatory Visit (HOSPITAL_COMMUNITY): Payer: Medicare HMO | Admitting: Certified Registered Nurse Anesthetist

## 2021-07-22 ENCOUNTER — Ambulatory Visit (HOSPITAL_COMMUNITY)
Admission: RE | Admit: 2021-07-22 | Discharge: 2021-07-22 | Disposition: A | Discharge status: Home / Self Care | Payer: Medicare HMO | Attending: Emergency Medicine | Admitting: Emergency Medicine

## 2021-07-22 ENCOUNTER — Ambulatory Visit (HOSPITAL_BASED_OUTPATIENT_CLINIC_OR_DEPARTMENT_OTHER): Payer: Medicare HMO | Admitting: Certified Registered Nurse Anesthetist

## 2021-07-22 ENCOUNTER — Encounter (HOSPITAL_COMMUNITY): Payer: Self-pay | Admitting: Emergency Medicine

## 2021-07-22 ENCOUNTER — Encounter (HOSPITAL_COMMUNITY)
Admission: RE | Disposition: A | Discharge status: Home / Self Care | Payer: Self-pay | Source: Home / Self Care | Attending: Emergency Medicine

## 2021-07-22 DIAGNOSIS — Z951 Presence of aortocoronary bypass graft: Secondary | ICD-10-CM | POA: Insufficient documentation

## 2021-07-22 DIAGNOSIS — Z87891 Personal history of nicotine dependence: Secondary | ICD-10-CM | POA: Insufficient documentation

## 2021-07-22 DIAGNOSIS — C3490 Malignant neoplasm of unspecified part of unspecified bronchus or lung: Secondary | ICD-10-CM

## 2021-07-22 DIAGNOSIS — Z7902 Long term (current) use of antithrombotics/antiplatelets: Secondary | ICD-10-CM | POA: Diagnosis not present

## 2021-07-22 DIAGNOSIS — I2510 Atherosclerotic heart disease of native coronary artery without angina pectoris: Secondary | ICD-10-CM

## 2021-07-22 DIAGNOSIS — R599 Enlarged lymph nodes, unspecified: Secondary | ICD-10-CM | POA: Diagnosis present

## 2021-07-22 DIAGNOSIS — M1990 Unspecified osteoarthritis, unspecified site: Secondary | ICD-10-CM | POA: Diagnosis not present

## 2021-07-22 DIAGNOSIS — C771 Secondary and unspecified malignant neoplasm of intrathoracic lymph nodes: Secondary | ICD-10-CM | POA: Diagnosis not present

## 2021-07-22 DIAGNOSIS — C3491 Malignant neoplasm of unspecified part of right bronchus or lung: Secondary | ICD-10-CM | POA: Diagnosis not present

## 2021-07-22 DIAGNOSIS — C3411 Malignant neoplasm of upper lobe, right bronchus or lung: Secondary | ICD-10-CM | POA: Diagnosis not present

## 2021-07-22 DIAGNOSIS — R590 Localized enlarged lymph nodes: Secondary | ICD-10-CM | POA: Diagnosis not present

## 2021-07-22 DIAGNOSIS — R918 Other nonspecific abnormal finding of lung field: Secondary | ICD-10-CM | POA: Diagnosis not present

## 2021-07-22 DIAGNOSIS — J449 Chronic obstructive pulmonary disease, unspecified: Secondary | ICD-10-CM | POA: Insufficient documentation

## 2021-07-22 DIAGNOSIS — C801 Malignant (primary) neoplasm, unspecified: Secondary | ICD-10-CM | POA: Diagnosis not present

## 2021-07-22 HISTORY — DX: Unspecified osteoarthritis, unspecified site: M19.90

## 2021-07-22 HISTORY — PX: BRONCHIAL BRUSHINGS: SHX5108

## 2021-07-22 HISTORY — PX: BRONCHIAL BIOPSY: SHX5109

## 2021-07-22 HISTORY — PX: BRONCHIAL NEEDLE ASPIRATION BIOPSY: SHX5106

## 2021-07-22 HISTORY — DX: Atherosclerotic heart disease of native coronary artery without angina pectoris: I25.10

## 2021-07-22 HISTORY — PX: VIDEO BRONCHOSCOPY WITH ENDOBRONCHIAL ULTRASOUND: SHX6177

## 2021-07-22 LAB — BASIC METABOLIC PANEL
Anion gap: 6 (ref 5–15)
BUN: 15 mg/dL (ref 8–23)
CO2: 24 mmol/L (ref 22–32)
Calcium: 9.1 mg/dL (ref 8.9–10.3)
Chloride: 106 mmol/L (ref 98–111)
Creatinine, Ser: 1.26 mg/dL — ABNORMAL HIGH (ref 0.61–1.24)
GFR, Estimated: 58 mL/min — ABNORMAL LOW (ref >60)
Glucose, Bld: 102 mg/dL — ABNORMAL HIGH (ref 70–99)
Potassium: 4.6 mmol/L (ref 3.5–5.1)
Sodium: 136 mmol/L (ref 135–145)

## 2021-07-22 LAB — CBC
HCT: 39.2 % (ref 39.0–52.0)
Hemoglobin: 12.6 g/dL — ABNORMAL LOW (ref 13.0–17.0)
MCH: 29.6 pg (ref 26.0–34.0)
MCHC: 32.1 g/dL (ref 30.0–36.0)
MCV: 92 fL (ref 80.0–100.0)
Platelets: 235 10*3/uL (ref 150–400)
RBC: 4.26 MIL/uL (ref 4.22–5.81)
RDW: 13.2 % (ref 11.5–15.5)
WBC: 6.8 10*3/uL (ref 4.0–10.5)
nRBC: 0 % (ref 0.0–0.2)

## 2021-07-22 SURGERY — BRONCHOSCOPY, WITH EBUS
Anesthesia: General | Laterality: Bilateral

## 2021-07-22 MED ORDER — ONDANSETRON HCL 4 MG/2ML IJ SOLN
INTRAMUSCULAR | Status: DC | PRN
  Administered 2021-07-22: 4 mg via INTRAVENOUS

## 2021-07-22 MED ORDER — PROMETHAZINE HCL 25 MG/ML IJ SOLN: 6.2500 mg | INTRAMUSCULAR | Status: DC | PRN

## 2021-07-22 MED ORDER — METOPROLOL TARTRATE 25 MG PO TABS
25.0000 mg | ORAL_TABLET | Freq: Once | ORAL | Status: AC
  Filled 2021-07-22: qty 1

## 2021-07-22 MED ORDER — DEXAMETHASONE SODIUM PHOSPHATE 10 MG/ML IJ SOLN
INTRAMUSCULAR | Status: DC | PRN
  Administered 2021-07-22: 10 mg via INTRAVENOUS

## 2021-07-22 MED ORDER — EPHEDRINE SULFATE-NACL 50-0.9 MG/10ML-% IV SOSY
PREFILLED_SYRINGE | INTRAVENOUS | Status: DC | PRN
  Administered 2021-07-22 (×3): 5 mg via INTRAVENOUS
  Administered 2021-07-22: 10 mg via INTRAVENOUS

## 2021-07-22 MED ORDER — LIDOCAINE 2% (20 MG/ML) 5 ML SYRINGE
INTRAMUSCULAR | Status: DC | PRN
  Administered 2021-07-22: 80 mg via INTRAVENOUS

## 2021-07-22 MED ORDER — METOPROLOL TARTRATE 12.5 MG HALF TABLET
ORAL_TABLET | ORAL | Status: AC
Start: 2021-07-22 — End: 2021-07-22
  Administered 2021-07-22: 25 mg via ORAL
  Filled 2021-07-22: qty 2

## 2021-07-22 MED ORDER — ROCURONIUM BROMIDE 10 MG/ML (PF) SYRINGE
PREFILLED_SYRINGE | INTRAVENOUS | Status: DC | PRN
  Administered 2021-07-22: 80 mg via INTRAVENOUS

## 2021-07-22 MED ORDER — SUGAMMADEX SODIUM 200 MG/2ML IV SOLN
INTRAVENOUS | Status: DC | PRN
  Administered 2021-07-22: 200 mg via INTRAVENOUS

## 2021-07-22 MED ORDER — CHLORHEXIDINE GLUCONATE 0.12 % MT SOLN
15.0000 mL | Freq: Once | OROMUCOSAL | Status: AC
Start: 2021-07-22 — End: 2021-07-22
  Administered 2021-07-22: 15 mL via OROMUCOSAL
  Filled 2021-07-22: qty 15

## 2021-07-22 MED ORDER — PROPOFOL 10 MG/ML IV BOLUS
INTRAVENOUS | Status: DC | PRN
  Administered 2021-07-22: 120 mg via INTRAVENOUS

## 2021-07-22 MED ORDER — CLOPIDOGREL BISULFATE 75 MG PO TABS: 75.0000 mg | ORAL_TABLET | Freq: Every day | ORAL | 2 refills | Status: DC

## 2021-07-22 MED ORDER — FENTANYL CITRATE (PF) 100 MCG/2ML IJ SOLN
INTRAMUSCULAR | Status: DC | PRN
  Administered 2021-07-22 (×2): 50 ug via INTRAVENOUS

## 2021-07-22 MED ORDER — LACTATED RINGERS IV SOLN: INTRAVENOUS | Status: DC

## 2021-07-22 MED ORDER — HYDROMORPHONE HCL 1 MG/ML IJ SOLN: 0.2500 mg | INTRAMUSCULAR | Status: DC | PRN

## 2021-07-22 MED ORDER — OXYCODONE HCL 5 MG PO TABS: 5.0000 mg | ORAL_TABLET | Freq: Once | ORAL | Status: DC | PRN

## 2021-07-22 MED ORDER — OXYCODONE HCL 5 MG/5ML PO SOLN: 5.0000 mg | Freq: Once | ORAL | Status: DC | PRN

## 2021-07-22 MED ORDER — AMISULPRIDE (ANTIEMETIC) 5 MG/2ML IV SOLN: 10.0000 mg | Freq: Once | INTRAVENOUS | Status: DC | PRN

## 2021-07-22 NOTE — Progress Notes (Signed)
Discharge Progress Report ? ?Patient Details  ?Name: Kevin [name] ?MRN: [id number] ?Date of Birth: Jan 07, 1941 ?Referring Provider:   ?Flowsheet Row CARDIAC REHAB PHASE II ORIENTATION from 07/10/2021 in [place]  ?Referring Provider Dr. Kipp Brood  ? ?  ? ? ? ?Number of Visits: 1 ? ?Reason for Discharge:  ?Early Exit:  Lung Cancer Treatment  ? ?Smoking History:  ?Social History  ? ?Tobacco Use  ?Smoking Status Former  ? Packs/day: 1.00  ? Years: 30.00  ? Pack years: 30.00  ? Types: Cigarettes  ? Quit date: 04/14/1995  ? Years since quitting: 26.2  ?Smokeless Tobacco Never  ? ? ?Diagnosis:  ?S/P CABG x 3 ? ?ADL UCSD: ? ? ?Initial Exercise Prescription: ? Initial Exercise Prescription - 07/10/21 1400   ? ?  ? Date of Initial Exercise RX and Referring Provider  ? Date 07/10/21   ? Referring Provider Dr. Kipp Brood   ?  ? Treadmill  ? MPH 1.6   ? Grade 0   ? Minutes 17   ?  ? Recumbant Elliptical  ? Level 1   ? RPM 60   ? Minutes 22   ?  ? Prescription Details  ? Frequency (times per week) 3   ? Duration Progress to 30 minutes of continuous aerobic without signs/symptoms of physical distress   ?  ? Intensity  ? THRR 40-80% of Max Heartrate 57-114   ? Ratings of Perceived Exertion 11-13   ?  ? Resistance Training  ? Training Prescription Yes   ? Weight 3   ? Reps 10-15   ? ?  ?  ? ?  ? ? ?Discharge Exercise Prescription (Final Exercise Prescription Changes): ? ? ?Functional Capacity: ? 6 Minute Walk   ? ? [place] Name 07/10/21 1413  ?  ?  ?  ? 6 Minute Walk  ? Phase Initial    ? Distance 1400 feet    ? Walk Time 6 minutes    ? # of Rest Breaks 0    ? MPH 2.65    ? METS 2.57    ? RPE 12    ? VO2 Peak 9    ? Symptoms No    ? Resting HR 68 bpm    ? Resting BP 122/54    ? Resting Oxygen Saturation  97 %    ? Exercise Oxygen Saturation  during 6 min walk 97 %    ? Max Ex. HR 78 bpm    ? Max Ex. BP 130/60    ? 2 Minute Post BP 118/58    ? ?  ?  ? ?  ? ? ?Psychological, QOL, Others - Outcomes: ?PHQ 2/9: ? ?   07/10/2021  ?  1:05 PM  ?Depression screen PHQ 2/9  ?Decreased Interest 0  ?Down, Depressed, Hopeless 0  ?PHQ - 2 Score 0  ?Altered sleeping 0  ?Tired, decreased energy 0  ?Change in appetite 0  ?Feeling bad or failure about yourself  0  ?Trouble concentrating 0  ?Moving slowly or fidgety/restless 0  ?Suicidal thoughts 0  ?PHQ-9 Score 0  ?Difficult doing work/chores Not difficult at all  ? ? ?Quality of Life: ? Quality of Life - 07/10/21 1418   ? ?  ? Quality of Life  ? [place] Quality of Life   ?  ? Quality of Life Scores  ? Health/Function Pre 29.17 %   ? Socioeconomic Pre 28.93 %   ? Psych/Spiritual Pre 30 %   ?  Family Pre 30 %   ? GLOBAL Pre 29.41 %   ? ?  ?  ? ?  ? ? ?Personal Goals: ?Goals established at orientation with interventions provided to work toward goal. ? Personal Goals and Risk Factors at Admission - 07/10/21 1437   ? ?  ? Core Components/Risk Factors/Patient Goals on Admission  ?  Weight Management Weight Maintenance   ? Lipids Yes   ? Intervention Provide education and support for participant on nutrition & aerobic/resistive exercise along with prescribed medications to achieve LDL 70mg , HDL >40mg .   ? Expected Outcomes Short Term: Participant states understanding of desired cholesterol values and is compliant with medications prescribed. Participant is following exercise prescription and nutrition guidelines.;Long Term: Cholesterol controlled with medications as prescribed, with individualized exercise RX and with personalized nutrition plan. Value goals: LDL < 70mg , HDL > 40 mg.   ? Personal Goal Other Yes   ? Personal Goal Patient wants to learn what he can do and if his heart is ready and strong enough to go back to his activities.   ? Intervention Patient will particiapte in the CR program with exercise and education 3 days/week and supplement with exercise at home.   ? Expected Outcomes Patient will complete the program meeting both personal and program goals.   ? ?  ?  ? ?  ?  ? ?Personal  Goals Discharge: ? ? ?Exercise Goals and Review: ? Exercise Goals   ? ? [place] Name 07/10/21 1417  ?  ?  ?  ?  ?  ? Exercise Goals  ? Increase Physical Activity Yes      ? Intervention Provide advice, education, support and counseling about physical activity/exercise needs.;Develop an individualized exercise prescription for aerobic and resistive training based on initial evaluation findings, risk stratification, comorbidities and participant's personal goals.      ? Expected Outcomes Short Term: Attend rehab on a regular basis to increase amount of physical activity.;Long Term: Add in home exercise to make exercise part of routine and to increase amount of physical activity.;Long Term: Exercising regularly at least 3-5 days a week.      ? Increase Strength and Stamina Yes      ? Intervention Provide advice, education, support and counseling about physical activity/exercise needs.;Develop an individualized exercise prescription for aerobic and resistive training based on initial evaluation findings, risk stratification, comorbidities and participant's personal goals.      ? Expected Outcomes Short Term: Increase workloads from initial exercise prescription for resistance, speed, and METs.;Short Term: Perform resistance training exercises routinely during rehab and add in resistance training at home;Long Term: Improve cardiorespiratory fitness, muscular endurance and strength as measured by increased METs and functional capacity (6MWT)      ? Able to understand and use rate of perceived exertion (RPE) scale Yes      ? Intervention Provide education and explanation on how to use RPE scale      ? Expected Outcomes Short Term: Able to use RPE daily in rehab to express subjective intensity level;Long Term:  Able to use RPE to guide intensity level when exercising independently      ? Knowledge and understanding of Target Heart Rate Range (THRR) Yes      ? Intervention Provide education and explanation of THRR including how  the numbers were predicted and where they are located for reference      ? Expected Outcomes Short Term: Able to state/look up THRR;Long Term: Able to use THRR to  govern intensity when exercising independently;Short Term: Able to use daily as guideline for intensity in rehab      ? Able to check pulse independently Yes      ? Intervention Provide education and demonstration on how to check pulse in carotid and radial arteries.;Review the importance of being able to check your own pulse for safety during independent exercise      ? Expected Outcomes Short Term: Able to explain why pulse checking is important during independent exercise;Long Term: Able to check pulse independently and accurately      ? Understanding of Exercise Prescription Yes      ? Intervention Provide education, explanation, and written materials on patient's individual exercise prescription      ? Expected Outcomes Short Term: Able to explain program exercise prescription;Long Term: Able to explain home exercise prescription to exercise independently      ? ?  ?  ? ?  ? ? ?Exercise Goals Re-Evaluation: ? ? ?Nutrition & Weight - Outcomes: ? Pre Biometrics - 07/10/21 1418   ? ?  ? Pre Biometrics  ? Height 5\' 10"  (1.778 m)   ? Weight 81.7 kg   ? Waist Circumference 38 inches   ? Hip Circumference 40 inches   ? Waist to Hip Ratio 0.95 %   ? BMI (Calculated) 25.84   ? Triceps Skinfold 10 mm   ? % Body Fat 48 %   ? Grip Strength 27.6 kg   ? Flexibility 9.5 in   ? Single Leg Stand 13 seconds   ? ?  ?  ? ?  ? ? ? ?Nutrition: ? Nutrition Therapy & Goals - 07/10/21 1348   ? ?  ? Personal Nutrition Goals  ? Comments Patient scored 41 on  his diet assessment. Handout provided and explained to patient and his wife who does his meal prep and shopping. Both verbalized understanding. We offer 2 educational sessions on heart healthy nutrition with handouts and assistance with RD referral if patient is interested.   ?  ? Intervention Plan  ? Intervention Nutrition  handout(s) given to patient.   ? ?  ?  ? ?  ? ? ?Nutrition Discharge: ? Nutrition Assessments - 07/10/21 1347   ? ?  ? MEDFICTS Scores  ? Pre Score 41   ? ?  ?  ? ?  ? ? ?Education Questionnaire Score: ?

## 2021-07-22 NOTE — Op Note (Signed)
Video Bronchoscopy with Endobronchial Ultrasound Procedure Note ? ?Date of Operation: 07/22/2021 ? ?Pre-op Diagnosis: Right upper lobe mass, mediastinal and hilar adenopathy ? ?Post-op Diagnosis: Same ? ?Surgeon: Baltazar Apo ? ?Assistants: None ? ?Anesthesia: General endotracheal anesthesia ? ?Operation: Flexible video fiberoptic bronchoscopy with endobronchial ultrasound and biopsies. ? ?Estimated Blood Loss: Minimal ? ?Complications: None apparent ? ?Indications and History: ?Kevin [name] is a 81 y.o. male with history tobacco use, CAD, COPD.  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  Subsequent imaging, PET scan showed residual right upper lobe mass lesion, mediastinal and hilar adenopathy.  Recommendation made to achieve a tissue diagnosis via bronchoscopy, endobronchial ultrasound.  The risks, benefits, complications, treatment options and expected outcomes were discussed with the patient.  The possibilities of pneumothorax, pneumonia, reaction to medication, pulmonary aspiration, perforation of a viscus, bleeding, failure to diagnose a condition and creating a complication requiring transfusion or operation were discussed with the patient who freely signed the consent.   ? ?Description of Procedure: ?The patient was examined in the preoperative area and history and data from the preprocedure consultation were reviewed. It was deemed appropriate to proceed.  The patient was taken to [facility] endoscopy room 3, identified as Chartered loss adjuster and the procedure verified as Flexible Video Fiberoptic Bronchoscopy.  A Time Out was held and the above information confirmed. After being taken to the operating room general anesthesia was initiated and the patient  was orally intubated. The video fiberoptic bronchoscope was introduced via the endotracheal tube and a general inspection was performed which showed normal left-sided airways.  The apical subsegment  of the right upper lobe was fishmouthed with some mucosal irregularity and hypervascularity.  The other right-sided airways appeared to be normal.  Endobronchial brushings and forceps biopsies were performed in the apical segments of the right upper lobe bronchus to be sent for cytology and pathology.  The standard scope was then withdrawn and the endobronchial ultrasound was used to identify and characterize the peritracheal, hilar and bronchial lymph nodes. Inspection showed enlargement at station 10 L, 4L, 7.  The there were clustered irregular nodes at station 10 and 11 R that presumably represent right lung mass. Using real-time ultrasound guidance Wang needle biopsies were take from Station 10 L, 4L, 7 nodes and were sent for cytology. The patient tolerated the procedure well without apparent complications. There was no significant blood loss. The bronchoscope was withdrawn. Anesthesia was reversed and the patient was taken to the PACU for recovery.  ? ?Samples: ?1. Wang needle biopsies from 10 L node ?2. Wang needle biopsies from 4L node ?3. Wang needle biopsies from 7 node ?4.  Endobronchial brushings apical segment right upper lobe bronchus ?5.  Endobronchial forceps biopsies apical segment right upper lobe bronchus ? ?Plans:  ?The patient will be discharged from the PACU to home when recovered from anesthesia. We will review the cytology, pathology and microbiology results with the patient when they become available. Outpatient followup will be with [facility] and Dr. Julien Nordmann.  ? ? ?Baltazar Apo, MD, PhD ?07/22/2021, 12:14 PM ?[facility] ?[phone] or if no answer before 7:00PM call [phone] ?For any issues after 7:00PM please call eLink [phone] ? ?

## 2021-07-22 NOTE — Anesthesia Preprocedure Evaluation (Signed)
Anesthesia Evaluation  ?Patient identified by MRN, date of birth, ID band ?Patient awake ? ? ? ?Reviewed: ?Allergy & Precautions, NPO status , Patient's Chart, lab work & pertinent test results ? ?Airway ?Mallampati: II ? ?TM Distance: >3 FB ?Neck ROM: Full ? ? ? Dental ? ?(+) Teeth Intact, Dental Advisory Given ?  ?Pulmonary ?former smoker,  ?  ?breath sounds clear to auscultation ? ? ? ? ? ? Cardiovascular ?+ CAD  ? ?Rhythm:Regular Rate:Normal ? ?Echo: ?1. Left ventricular ejection fraction, by estimation, is 40 to 45%. The  ?left ventricle has mildly decreased function. The left ventricle  ?demonstrates global hypokinesis. Left ventricular diastolic parameters  ?were normal.  ??2. Right ventricular systolic function is normal. The right ventricular  ?size is normal.  ??3. The mitral valve is normal in structure. Trivial mitral valve  ?regurgitation. No evidence of mitral stenosis.  ??4. The aortic valve is normal in structure. Aortic valve regurgitation is  ?not visualized. No aortic stenosis is present.  ?  ?Neuro/Psych ?negative neurological ROS ? negative psych ROS  ? GI/Hepatic ?negative GI ROS, Neg liver ROS,   ?Endo/Other  ?negative endocrine ROS ? Renal/GU ?negative Renal ROS  ? ?  ?Musculoskeletal ? ?(+) Arthritis , Osteoarthritis,   ? Abdominal ?Normal abdominal exam  (+)   ?Peds ? Hematology ?negative hematology ROS ?(+)   ?Anesthesia Other Findings ?Lung Cancer ? Reproductive/Obstetrics ? ?  ? ? ? ? ? ? ? ? ? ? ? ? ? ?  ?  ? ? ? ? ? ? ? ? ?Anesthesia Physical ? ?Anesthesia Plan ? ?ASA: 4 ? ?Anesthesia Plan: General  ? ?Post-op Pain Management: Minimal or no pain anticipated  ? ?Induction: Intravenous ? ?PONV Risk Score and Plan: 2 and Ondansetron, Midazolam and Treatment may vary due to age or medical condition ? ?Airway Management Planned: Oral ETT ? ?Additional Equipment: None ? ?Intra-op Plan:  ? ?Post-operative Plan: Extubation in OR ? ?Informed Consent: I have  reviewed the patients History and Physical, chart, labs and discussed the procedure including the risks, benefits and alternatives for the proposed anesthesia with the patient or authorized representative who has indicated his/her understanding and acceptance.  ? ? ? ?Dental advisory given ? ?Plan Discussed with: CRNA ? ?Anesthesia Plan Comments:   ? ? ? ? ? ? ?Anesthesia Quick Evaluation ? ?

## 2021-07-22 NOTE — Transfer of Care (Signed)
Immediate Anesthesia Transfer of Care Note ? ?Patient: Chartered loss adjuster ? ?Procedure(s) Performed: VIDEO BRONCHOSCOPY WITH ENDOBRONCHIAL ULTRASOUND (Bilateral) ?BRONCHIAL BRUSHINGS ?BRONCHIAL NEEDLE ASPIRATION BIOPSIES ?BRONCHIAL BIOPSIES ? ?Patient Location: PACU ? ?Anesthesia Type:General ? ?Level of Consciousness: awake, alert  and oriented ? ?Airway & Oxygen Therapy: Patient Spontanous Breathing and Patient connected to face mask oxygen ? ?Post-op Assessment: Report given to RN and Post -op Vital signs reviewed and stable ? ?Post vital signs: Reviewed and stable ? ?Last Vitals:  ?Vitals Value Taken Time  ?BP 148/69 07/22/21 1214  ?Temp    ?Pulse 66 07/22/21 1215  ?Resp 21 07/22/21 1215  ?SpO2 100 % 07/22/21 1215  ?Vitals shown include unvalidated device data. ? ?Last Pain:  ?Vitals:  ? 07/22/21 0915  ?TempSrc:   ?PainSc: 0-No pain  ?   ? ?  ? ?Complications: No notable events documented. ?

## 2021-07-22 NOTE — Discharge Instructions (Signed)
Flexible Bronchoscopy, Care After ?This sheet gives you information about how to care for yourself after your test. Your doctor may also give you more specific instructions. If you have problems or questions, contact your doctor. ?Follow these instructions at home: ?Eating and drinking ?Do not eat or drink anything (not even water) for 2 hours after your test, or until your numbing medicine (local anesthetic) wears off. ?When your numbness is gone and your cough and gag reflexes have come back, you may: ?Eat only soft foods. ?Slowly drink liquids. ?The day after the test, go back to your normal diet. ?Driving ?Do not drive for 24 hours if you were given a medicine to help you relax (sedative). ?Do not drive or use heavy machinery while taking prescription pain medicine. ?General instructions ? ?Take over-the-counter and prescription medicines only as told by your doctor. ?Return to your normal activities as told. Ask what activities are safe for you. ?Do not use any products that have nicotine or tobacco in them. This includes cigarettes and e-cigarettes. If you need help quitting, ask your doctor. ?Keep all follow-up visits as told by your doctor. This is important. It is very important if you had a tissue sample (biopsy) taken. ?Get help right away if: ?You have shortness of breath that gets worse. ?You get light-headed. ?You feel like you are going to pass out (faint). ?You have chest pain. ?You cough up: ?More than a little blood. ?More blood than before. ?Summary ?Do not eat or drink anything (not even water) for 2 hours after your test, or until your numbing medicine wears off. ?Do not use cigarettes. Do not use e-cigarettes. ?Get help right away if you have chest pain. ? ?Please call our office for any questions or concerns.  [phone]. ? ?This information is not intended to replace advice given to you by your health care provider. Make sure you discuss any questions you have with your health care  provider. ?Document Released: 01/25/2009 Document Revised: 03/12/2017 Document Reviewed: 04/17/2016 ?Elsevier Patient Education ? [place]. ? ?

## 2021-07-22 NOTE — Interval H&P Note (Signed)
History and Physical Interval Note: ? ?07/22/2021 ?10:36 AM ? ?Kevin [name]  has presented today for surgery, with the diagnosis of Lung massm adenopathy.  The various methods of treatment have been discussed with the patient and family. After consideration of risks, benefits and other options for treatment, the patient has consented to  Procedure(s): ?VIDEO BRONCHOSCOPY WITH ENDOBRONCHIAL ULTRASOUND (Bilateral) as a surgical intervention.  The patient's history has been reviewed, patient examined, no change in status, stable for surgery.  I have reviewed the patient's chart and labs.  Questions were answered to the patient's satisfaction.   ? ? ?Rose Fillers Phoebie Shad ? ? ?

## 2021-07-22 NOTE — Anesthesia Postprocedure Evaluation (Signed)
Anesthesia Post Note ? ?Patient: Chartered loss adjuster ? ?Procedure(s) Performed: VIDEO BRONCHOSCOPY WITH ENDOBRONCHIAL ULTRASOUND (Bilateral) ?BRONCHIAL BRUSHINGS ?BRONCHIAL NEEDLE ASPIRATION BIOPSIES ?BRONCHIAL BIOPSIES ? ?  ? ?Patient location during evaluation: PACU ?Anesthesia Type: General ?Level of consciousness: awake and alert ?Pain management: pain level controlled ?Vital Signs Assessment: post-procedure vital signs reviewed and stable ?Respiratory status: spontaneous breathing, nonlabored ventilation and respiratory function stable ?Cardiovascular status: blood pressure returned to baseline and stable ?Postop Assessment: no apparent nausea or vomiting ?Anesthetic complications: no ? ? ?No notable events documented. ? ?Last Vitals:  ?Vitals:  ? 07/22/21 1230 07/22/21 1245  ?BP: 123/64 118/62  ?Pulse: 69 72  ?Resp: 11 18  ?Temp:    ?SpO2: 94% 93%  ?  ?Last Pain:  ?Vitals:  ? 07/22/21 1245  ?TempSrc:   ?PainSc: 0-No pain  ? ? ?  ?  ?  ?  ?  ?  ? ?Lynda Rainwater ? ? ? ? ?

## 2021-07-22 NOTE — Anesthesia Procedure Notes (Signed)
Procedure Name: Intubation ?Date/Time: 07/22/2021 11:06 AM ?Performed by: Genelle Bal, CRNA ?Pre-anesthesia Checklist: Patient identified, Emergency Drugs available, Suction available and Patient being monitored ?Patient Re-evaluated:Patient Re-evaluated prior to induction ?Oxygen Delivery Method: Circle system utilized ?Preoxygenation: Pre-oxygenation with 100% oxygen ?Induction Type: IV induction ?Ventilation: Mask ventilation without difficulty and Oral airway inserted - appropriate to patient size ?Laryngoscope Size: Sabra Heck and 2 ?Grade View: Grade I ?Tube type: Oral ?Tube size: 8.5 mm ?Number of attempts: 1 ?Airway Equipment and Method: Stylet and Oral airway ?Placement Confirmation: ETT inserted through vocal cords under direct vision, positive ETCO2 and breath sounds checked- equal and bilateral ?Secured at: 23 cm ?Tube secured with: Tape ?Dental Injury: Teeth and Oropharynx as per pre-operative assessment  ? ? ? ? ?

## 2021-07-23 ENCOUNTER — Encounter (HOSPITAL_COMMUNITY): Payer: Self-pay | Admitting: Emergency Medicine

## 2021-07-23 LAB — CYTOLOGY - NON PAP

## 2021-07-24 LAB — CYTOLOGY - NON PAP

## 2021-07-25 ENCOUNTER — Encounter: Payer: Self-pay | Admitting: *Deleted

## 2021-07-25 ENCOUNTER — Telehealth: Payer: Self-pay | Admitting: Emergency Medicine

## 2021-07-25 NOTE — Telephone Encounter (Signed)
Called the patient discussed bronchoscopy results.  No answer, left voicemails.  His biopsies show adenocarcinoma including a positive station 7 node.  We will call him back. ?

## 2021-07-25 NOTE — Progress Notes (Signed)
Oncology Nurse Navigator Documentation ? ? ?  07/25/2021  ?  1:00 PM 07/18/2021  ? 11:00 AM 07/16/2021  ?  2:00 PM 07/11/2021  ? 10:00 AM 07/09/2021  ?  9:32 AM 07/09/2021  ?  9:00 AM  ?Oncology Nurse Navigator Flowsheets  ?Abnormal Finding Date     05/07/2021 05/07/2021  ?Confirmed Diagnosis Date     05/13/2021 05/13/2021  ?Diagnosis Status      Confirmed Diagnosis Complete  ?Planned Course of Treatment      Surgery  ?Phase of Treatment      Surgery  ?Surgery Actual Start Date:      05/13/2021  ?Navigator Follow Up Date: 07/28/2021  07/24/2021 07/15/2021  07/10/2021  ?Navigator Follow Up Reason: Appointment Review Pathology Appointment Review;Pathology;Review Note Appointment Review  Follow-up Appointment  ?Navigator Location [facility]  ?Navigator Encounter Type Appt/Treatment Plan Review Pathology Review Appt/Treatment Plan Review Appt/Treatment Plan Review Appt/Treatment Plan Review Appt/Treatment Plan Review  ?Treatment Initiated Date     05/13/2021 05/13/2021  ?Patient Visit Type     Other Other  ?Treatment Phase     Other Other  ?Barriers/Navigation Needs Coordination of Care Coordination of Care Coordination of Care Coordination of Care Coordination of Care Coordination of Care  ?Interventions Coordination of Care/I checked on patient's path and updated Dr. Julien Nordmann.  Coordination of Care Coordination of Care Coordination of Care Coordination of Care Coordination of Care  ?Acuity Level 2-Minimal Needs (1-2 Barriers Identified) Level 2-Minimal Needs (1-2 Barriers Identified) Level 2-Minimal Needs (1-2 Barriers Identified) Level 2-Minimal Needs (1-2 Barriers Identified) Level 2-Minimal Needs (1-2 Barriers Identified) Level 2-Minimal Needs (1-2 Barriers Identified)  ?Coordination of Care Other Pathology;Other Other Other Appts Appts  ?Time Spent with Patient 30 30 30 15 30 30   ?  ?

## 2021-07-28 ENCOUNTER — Encounter: Payer: Self-pay | Admitting: *Deleted

## 2021-07-28 NOTE — Progress Notes (Signed)
Oncology Nurse Navigator Documentation ? ? ?  07/28/2021  ?  9:00 AM 07/25/2021  ?  1:00 PM 07/18/2021  ? 11:00 AM 07/16/2021  ?  2:00 PM 07/11/2021  ? 10:00 AM 07/09/2021  ?  9:32 AM 07/09/2021  ?  9:00 AM  ?Oncology Nurse Navigator Flowsheets  ?Abnormal Finding Date      05/07/2021 05/07/2021  ?Confirmed Diagnosis Date      05/13/2021 05/13/2021  ?Diagnosis Status       Confirmed Diagnosis Complete  ?Planned Course of Treatment       Surgery  ?Phase of Treatment       Surgery  ?Surgery Actual Start Date:       05/13/2021  ?Navigator Follow Up Date:  07/28/2021  07/24/2021 07/15/2021  07/10/2021  ?Navigator Follow Up Reason:  Appointment Review Pathology Appointment Review;Pathology;Review Note Appointment Review  Follow-up Appointment  ?Navigator Location [facility]  [facility]  ?Navigator Encounter Type Molecular Studies Appt/Treatment Plan Review Pathology Review Appt/Treatment Plan Review Appt/Treatment Plan Review Appt/Treatment Plan Review Appt/Treatment Plan Review  ?Treatment Initiated Date      05/13/2021 05/13/2021  ?Patient Visit Type      Other Other  ?Treatment Phase Pre-Tx/Tx Discussion     Other Other  ?Barriers/Navigation Needs Coordination of Care Coordination of Care Coordination of Care Coordination of Care Coordination of Care Coordination of Care Coordination of Care  ?Interventions Coordination of Care/per Dr. Julien Nordmann, I notified pathology to [facility]. They updated me that this test has gone to [facility].  I will update Dr. Julien Nordmann.  Coordination of Care Coordination of Care Coordination of Care Coordination of Care Coordination of Care Coordination of Care  ?Acuity Level 2-Minimal Needs (1-2 Barriers Identified) Level 2-Minimal Needs (1-2 Barriers Identified) Level 2-Minimal Needs (1-2 Barriers Identified) Level 2-Minimal Needs (1-2 Barriers Identified) Level 2-Minimal Needs (1-2 Barriers Identified) Level  2-Minimal Needs (1-2 Barriers Identified) Level 2-Minimal Needs (1-2 Barriers Identified)  ?Coordination of Care Pathology Other Pathology;Other Other Other Appts Appts  ?Time Spent with Patient 30 30 30 30 15 30 30   ?  ?

## 2021-07-28 NOTE — Telephone Encounter (Signed)
Discussed bronchoscopy results with the patient by phone today.  He is positive station 7 lymph node for adenocarcinoma, probably positive at station 10 L as well.  This will impact next steps in his therapy.  He has an office visit planned with Dr. Julien Nordmann in June, presume that they will want to see him sooner than that to plan next steps.  I will forward this information to Dr. Julien Nordmann into [place], RN thoracic coordinator. ? ? ?

## 2021-07-28 NOTE — Progress Notes (Signed)
Oncology Nurse Navigator Documentation ? ? ?  07/28/2021  ?  3:00 PM 07/28/2021  ?  9:00 AM 07/25/2021  ?  1:00 PM 07/18/2021  ? 11:00 AM 07/16/2021  ?  2:00 PM 07/11/2021  ? 10:00 AM 07/09/2021  ?  9:32 AM  ?Oncology Nurse Navigator Flowsheets  ?Abnormal Finding Date       05/07/2021  ?Confirmed Diagnosis Date       05/13/2021  ?Navigator Follow Up Date: 07/30/2021  07/28/2021  07/24/2021 07/15/2021   ?Navigator Follow Up Reason: Follow-up Appointment  Appointment Review Pathology Appointment Review;Pathology;Review Note Appointment Review   ?Navigator Location [facility]   ?Navigator Encounter Type Follow-up Appt Molecular Studies Appt/Treatment Plan Review Pathology Review Appt/Treatment Plan Review Appt/Treatment Plan Review Appt/Treatment Plan Review  ?Treatment Initiated Date       05/13/2021  ?Patient Visit Type       Other  ?Treatment Phase Pre-Tx/Tx Discussion Pre-Tx/Tx Discussion     Other  ?Barriers/Navigation Needs Coordination of Care/I received a message today from Dr. Julien Nordmann. He would like to see patient this week.  I completed a scheduling message for them to call him for an appt for 4/19 with Dr. Julien Nordmann.  Coordination of Care Coordination of Care Coordination of Care Coordination of Care Coordination of Care Coordination of Care  ?Interventions Coordination of Care Coordination of Care Coordination of Care Coordination of Care Coordination of Care Coordination of Care Coordination of Care  ?Acuity Level 2-Minimal Needs (1-2 Barriers Identified) Level 2-Minimal Needs (1-2 Barriers Identified) Level 2-Minimal Needs (1-2 Barriers Identified) Level 2-Minimal Needs (1-2 Barriers Identified) Level 2-Minimal Needs (1-2 Barriers Identified) Level 2-Minimal Needs (1-2 Barriers Identified) Level 2-Minimal Needs (1-2 Barriers Identified)  ?Coordination of Care Other Pathology Other Pathology;Other Other Other Appts  ?Time Spent with  Patient 30 30 30 30 30 15 30   ?  ?

## 2021-07-29 ENCOUNTER — Telehealth: Payer: Self-pay | Admitting: Internal Medicine

## 2021-07-29 DIAGNOSIS — C3490 Malignant neoplasm of unspecified part of unspecified bronchus or lung: Secondary | ICD-10-CM | POA: Diagnosis not present

## 2021-07-29 NOTE — Telephone Encounter (Signed)
.  Called patient to schedule appointment per 4/17 inbasket, patient is aware of date and time.   ?

## 2021-07-29 NOTE — Interval H&P Note (Signed)
History and Physical Interval Note: ? ?07/29/2021 ?2:12 PM ? ?Addendum to Note from 07/22/21 ?Physical exam was inadvertently omitted from that note, included below: ? ?Vitals:  ? 07/22/21 0915 07/22/21 1215 07/22/21 1230 07/22/21 1245  ?BP: 132/75 (!) 148/69 123/64 118/62  ?Pulse: 69 72 69 72  ?Resp:  17 11 18   ?Temp:  97.9 ?F (36.6 ?C)    ?TempSrc:      ?SpO2: 95% 100% 94% 93%  ?Weight:      ?Height:      ?Elderly man, no distress, interacting normally ?OP clear, no lesions, pupils equal ?Lungs distant but clear bilaterally ?Heart regular, no M ?Abdomen, non-distended, + BS ?Ext without edema ?Awake, alert, appropriate, non-focal neuro exam ? ?Baltazar Apo, MD, PhD ?07/29/2021, 2:15 PM ?[facility] ?[phone] or if no answer before 7:00PM call [phone] ?For any issues after 7:00PM please call eLink [phone] ? ? ? ?

## 2021-07-30 ENCOUNTER — Inpatient Hospital Stay: Payer: Medicare HMO | Attending: Internal Medicine | Admitting: Internal Medicine

## 2021-07-30 ENCOUNTER — Encounter: Payer: Self-pay | Admitting: *Deleted

## 2021-07-30 ENCOUNTER — Other Ambulatory Visit: Payer: Self-pay

## 2021-07-30 ENCOUNTER — Inpatient Hospital Stay: Payer: Medicare HMO

## 2021-07-30 ENCOUNTER — Telehealth: Payer: Self-pay | Admitting: Radiation Oncology

## 2021-07-30 VITALS — BP 120/68 | HR 71 | Temp 97.8°F | Resp 18 | Ht 70.0 in | Wt 176.2 lb

## 2021-07-30 DIAGNOSIS — Z5111 Encounter for antineoplastic chemotherapy: Secondary | ICD-10-CM | POA: Diagnosis not present

## 2021-07-30 DIAGNOSIS — C3491 Malignant neoplasm of unspecified part of right bronchus or lung: Secondary | ICD-10-CM

## 2021-07-30 DIAGNOSIS — C3411 Malignant neoplasm of upper lobe, right bronchus or lung: Secondary | ICD-10-CM | POA: Insufficient documentation

## 2021-07-30 DIAGNOSIS — Z902 Acquired absence of lung [part of]: Secondary | ICD-10-CM | POA: Diagnosis not present

## 2021-07-30 LAB — CMP (CANCER CENTER ONLY)
ALT: 9 U/L (ref 0–44)
AST: 11 U/L — ABNORMAL LOW (ref 15–41)
Albumin: 4.2 g/dL (ref 3.5–5.0)
Alkaline Phosphatase: 76 U/L (ref 38–126)
Anion gap: 6 (ref 5–15)
BUN: 24 mg/dL — ABNORMAL HIGH (ref 8–23)
CO2: 28 mmol/L (ref 22–32)
Calcium: 10.1 mg/dL (ref 8.9–10.3)
Chloride: 104 mmol/L (ref 98–111)
Creatinine: 1.72 mg/dL — ABNORMAL HIGH (ref 0.61–1.24)
GFR, Estimated: 40 mL/min — ABNORMAL LOW (ref >60)
Glucose, Bld: 93 mg/dL (ref 70–99)
Potassium: 4.7 mmol/L (ref 3.5–5.1)
Sodium: 138 mmol/L (ref 135–145)
Total Bilirubin: 0.9 mg/dL (ref 0.3–1.2)
Total Protein: 7.4 g/dL (ref 6.5–8.1)

## 2021-07-30 LAB — CBC WITH DIFFERENTIAL (CANCER CENTER ONLY)
Abs Immature Granulocytes: 0.03 10*3/uL (ref 0.00–0.07)
Basophils Absolute: 0.1 10*3/uL (ref 0.0–0.1)
Basophils Relative: 1 %
Eosinophils Absolute: 0.2 10*3/uL (ref 0.0–0.5)
Eosinophils Relative: 2 %
HCT: 38 % — ABNORMAL LOW (ref 39.0–52.0)
Hemoglobin: 12.4 g/dL — ABNORMAL LOW (ref 13.0–17.0)
Immature Granulocytes: 0 %
Lymphocytes Relative: 22 %
Lymphs Abs: 2 10*3/uL (ref 0.7–4.0)
MCH: 29.7 pg (ref 26.0–34.0)
MCHC: 32.6 g/dL (ref 30.0–36.0)
MCV: 90.9 fL (ref 80.0–100.0)
Monocytes Absolute: 0.8 10*3/uL (ref 0.1–1.0)
Monocytes Relative: 9 %
Neutro Abs: 6 10*3/uL (ref 1.7–7.7)
Neutrophils Relative %: 66 %
Platelet Count: 251 10*3/uL (ref 150–400)
RBC: 4.18 MIL/uL — ABNORMAL LOW (ref 4.22–5.81)
RDW: 13.5 % (ref 11.5–15.5)
WBC Count: 9 10*3/uL (ref 4.0–10.5)
nRBC: 0 % (ref 0.0–0.2)

## 2021-07-30 NOTE — Progress Notes (Signed)
Patient consultation was conducted via Telephone. ? ?Thoracic Location of Tumor / Histology: R[facility] Lung ? ?Patient presented earlier this year with complaints of SOB and burning sensation in his chest.  ? ?MRI Brain 07/07/2021: No evidence of metastatic disease. ? ?PET 07/07/2021: 2.9 by 2.0 cm right upper lobe nodule adjacent to the wedge resection site, maximum SUV 26.1, compatible with malignancy.  Small hypermetabolic right hilar, right subcarinal, and left infrahilar lymph nodes present. Although conceivably reactive, these are high enough in activity to be concerning for malignant nodal involvement.  No findings of metastatic disease outside of chest. ? ?CT Chest 05/07/2021: Two adjacent lesions in the right upper lobe with spiculated margins measuring 4.0 x 3.5 cm along the major fissure and 2.0 x 2.2 cm adjacent to the right upper lobe bronchus. There is thickening of the peribronchial tissues and occlusion of the right upper lobe bronchus. Findings most consistent with malignancy. ? ?Biopsies of RUL 05/13/2021 ? ? ? ?Tobacco/Marijuana/Snuff/ETOH use: Former Smoker quit 1997. ? ?Past/Anticipated interventions by cardiothoracic surgery, if any:  ?Dr. Kipp Brood ?-Right Upper Lobe wedge resection 05/13/2021 ? ?Past/Anticipated interventions by medical oncology, if any:  ?Dr. Julien Nordmann 07/30/2021 ?-I recommended for the patient a course of concurrent chemoradiation with weekly carboplatin for AUC of 2 and paclitaxel 45 Mg/M2 for 6-7 weeks. ?-  This will be followed by consolidation treatment with immunotherapy with Imfinzi if he has no evidence for disease progression after the induction treatment. ?-The patient is expected to start the first cycle of this treatment on Aug 11, 2021. ?-I will refer him to radiation oncology for discussion of the radiotherapy option ? ?Signs/Symptoms ?Weight changes, if any: Has lost about 20 pounds ?Respiratory complaints, if any: Denies SOB. ?Hemoptysis, if any: Denies. ?Pain issues, if  any: No  ? ?SAFETY ISSUES: ?Prior radiation? No ?Pacemaker/ICD? No  ?Possible current pregnancy? No ?Is the patient on methotrexate?  ? ?Current Complaints / other details:   ?

## 2021-07-30 NOTE — Progress Notes (Signed)

## 2021-07-30 NOTE — Patient Instructions (Signed)
Thank you for choosing [facility] to provide your care.   ?Should you have questions after your visit to the [facility]), please contact this office at [phone] between 8:30 AM and 4:30 PM.  ?Voice mails left after 4:00 PM may not be returned until the following business day.  ?Calls received after 4:30 PM will be answered by an off-site Nurse Triage Line. ?   ?Prescription Refills:  Please have your pharmacy contact us directly for most prescription requests.  Contact the office directly for refills of narcotics (pain medications). Allow 48-72 hours for refills. ? ?Appointments: Please contact the [facility] scheduling department [phone] for questions regarding [facility] appointment scheduling.  Contact the schedulers with any scheduling changes so that your appointment can be rescheduled in a timely manner.  ? ?Central Scheduling for [facility] [phone] - Call to schedule procedures such as PET scans, CT scans, MRI, Ultrasound, etc. ? ?To afford each patient quality time with our providers, please arrive 30 minutes before your scheduled appointment time.  If you arrive late for your appointment, you may be asked to reschedule.  We strive to give you quality time with our providers, and arriving late affects you and other patients whose appointments are after yours. If you are a no show for multiple scheduled visits, you may be dismissed from the clinic at the providers discretion.   ?  ?Resources: ?[place] [employer] [phone] for additional information on assistance programs or assistance connecting with community support programs   ?[place]  [phone]: Information regarding food stamps, Medicaid, and utility assistance ?[employer] [place] [employer]'s shared-ride transportation service for eligible riders who have a disability that prevents them from riding the fixed route bus.   ?Medicare [place] [phone]  Helps people with Medicare understand their rights and benefits, navigate the Medicare system, and secure the quality healthcare they deserve ?[place] 514-286-1822 Assists patients locate various types of support and financial assistance ?Cancer Care: 1-800-813-HOPE 914-267-4919) Provides financial assistance, online support groups, medication/co-pay assistance.   ?Transportation Assistance for appointments at [facility] ? ?Again, thank you for choosing [facility] for your care.    ?   ? ?Lung Cancer ?Lung cancer is an abnormal growth of cancerous cells that forms a mass (malignant tumor) in a lung. There are several types of lung cancer. The types are based on the appearance of the tumor cells. The two most common types are: ?Non-small cell lung cancer. This type of lung cancer is the most common type. Non-small cell lung cancers include squamous cell carcinoma, adenocarcinoma, and large cell carcinoma. ?Small cell lung cancer. In this type of lung cancer, abnormal cells are smaller than those of non-small cell lung cancer. Small cell lung cancer gets worse (progresses) faster than non-small cell lung cancer. ?What are the causes? ?The most common cause of lung cancer is smoking tobacco. The second most common cause is exposure to a chemical called radon. ?What increases the risk? ?You are more likely to develop this condition if: ?You smoke tobacco. ?You have been exposed to: ?Secondhand tobacco smoke. ?Radon gas. ?Uranium. ?Asbestos. ?Arsenic in drinking water. ?Air pollution and diesel exhaust. ?You have a family or personal history of lung cancer. ?You have had lung radiation therapy in the past. ?You are older than age 54. ?What are the signs or symptoms? ?In the early stages, you may not have any symptoms. As the cancer progresses, symptoms may include: ?  A lasting cough, possibly with blood. ?Fatigue. ?Unexplained weight loss. ?Shortness of breath. ?High-pitched whistling sounds  when you breathe, most often when you breathe out (wheezing). ?Chest pain. ?Loss of appetite. ?Symptoms of advanced lung cancer include: ?Hoarseness. ?Bone or joint pain. ?Weakness. ?Change in the structure of the fingernails (clubbing), so that the nail looks like an upside-down spoon. ?Swelling of the face or arms. ?Inability to move the face (paralysis). ?Drooping eyelids. ?How is this diagnosed? ?This condition may be diagnosed based on: ?Your symptoms and medical history. ?A physical exam. ?A chest X-ray. ?A CT scan. ?Blood tests. ?Sputum tests. ?Removal of a sample of lung tissue (lung biopsy) for testing. ?Your cancer will be assessed (staged) to determine how severe it is and how much it has spread (metastasized). ?How is this treated? ?Treatment depends on the type and stage of your cancer. Treatment may include one or more of the following: ?Surgery to remove as much of the cancer as possible. Lymph nodes in the area may be removed and tested for cancer as well. ?Medicines that kill cancer cells (chemotherapy). ?High-energy rays that kill cancer cells (radiation therapy). ?Targeted therapy. This targets specific parts of cancer cells and the area around them to block the growth and spread of the cancer. Targeted therapy can help limit the damage to healthy cells. ?Immunotherapy. This treatment uses a person's own immune system to fight cancer by either boosting the immune system or changing how the immune system works. ?Follow these instructions at home: ? ?Do not use any products that contain nicotine or tobacco. These products include cigarettes, chewing tobacco, and vaping devices, such as e-cigarettes. If you need help quitting, ask your health care provider. ?Do not drink alcohol. ?If you are admitted to the hospital, make sure your cancer specialist (oncologist) is aware. Your cancer may affect your treatment for other conditions. ?Take over-the-counter and prescription medicines only as told by your  health care provider. ?Work with your health care provider to manage any side effects of treatment. ?Keep all follow-up visits. This is important. ?Where to find support ?Consider joining a local support group for people who have been diagnosed with lung cancer. ?Where to find more information ?American Cancer Society: www.cancer.org ?[place] www.cancer.gov ?Contact a health care provider if you: ?Lose weight without trying. ?Have a persistent cough and wheezing. ?Feel short of breath. ?Get tired easily. ?Have bone or joint pain. ?Have difficulty swallowing. ?Notice that your voice is changing or getting hoarse. ?Have pain that does not get better with medicine. ?Get help right away if you: ?Cough up blood. ?Have chest pain or new breathing problems. ?Have a fever. ?Have swelling in an ankle, leg, or arm, or the face or neck. ?Have paralysis in your face. ?Are very confused. ?Have a drooping eyelid. ?These symptoms may represent a serious problem that is an emergency. Do not wait to see if the symptoms will go away. Get medical help right away. Call your local emergency services (911 in the [place] Do not drive yourself to the hospital. ?Summary ?Lung cancer is an abnormal growth of cancerous cells that forms a mass (malignant tumor) in a lung. ?There are several types of lung cancer. The types are based on the appearance of the tumor cells. The two most common types are non-small cell and small cell. ?The most common cause of lung cancer is smoking tobacco. ?Early symptoms include a lasting cough, possibly with blood, and fatigue, unexplained weight loss, and shortness of breath. ?After  diagnosis, treatment depends on the type and stage of your cancer. ?This information is not intended to replace advice given to you by your health care provider. Make sure you discuss any questions you have with your health care provider. ?Chemotherapy ?Chemotherapy is a cancer treatment. It uses medicines to  slow down or stop the growth of cancer. You may have chemotherapy to: ?Cure your cancer. ?Prevent the cancer from growing or spreading (metastasizing). ?Ease symptoms and improve your quality of lif

## 2021-07-30 NOTE — Progress Notes (Signed)
?    [name] ?[phone]   [phone] ? ?OFFICE PROGRESS NOTE ? ?[name], MD ?[address] ?[place] [address] ? ?DIAGNOSIS: Stage IIIC (T3, N3, M0) non-small cell lung cancer, adenocarcinoma presented with right upper lobe lung mass status post right upper lobe wedge resection but no lymph node dissection.  The tumor measured 3.3 cm with visceral pleural involvement and very close resection margin less than 0.1 cm from the tumor.  This was performed on May 13, 2021 in the same setting of CABG under the care of Dr. Kipp Brood.  PET scan performed in March 2023 showed a right upper lobe nodule adjacent to the wedge resection site small hypermetabolic right hilar, right subcarinal, and left infrahilar lymph nodes which could be reactive but are suspicious for nodal involvement. ?  ?Molecular studies showed no actionable mutations and PD-L1 expression was 0% ?  ?PRIOR THERAPY: Right upper lobe wedge resection under the care of Dr. Kipp Brood on 05/13/2021 ?  ?CURRENT THERAPY: Concurrent chemoradiation with weekly carboplatin for AUC of 2 and paclitaxel 45 Mg/M2.  First dose Aug 11, 2021. ? ?INTERVAL HISTORY: ?Kevin [name] 81 y.o. male returns to the clinic today for follow-up visit accompanied by his wife and his daughter Amy was available by FaceTime during the visit.  The patient is feeling fine today with no concerning complaints.  He denied having any current chest pain, shortness of breath but continues to have mild cough with no hemoptysis.  He has no nausea, vomiting, diarrhea or constipation.  He has no headache or visual changes.  He denied having any recent weight loss or night sweats.  The patient was seen by Dr. Lamonte Sakai and he underwent bronchoscopy with biopsy of the right upper lobe lung mass as well as the mediastinal lymphadenopathy and these were positive for non-small cell lung cancer, adenocarcinoma.  The patient is here today for evaluation and discussion  of his treatment option based on the new findings. ? ?MEDICAL HISTORY: ?Past Medical History:  ?Diagnosis Date  ? Arthritis   ? Coronary artery disease   ? Hyperlipidemia   ? ? ?ALLERGIES:  has No Known Allergies. ? ?MEDICATIONS:  ?Current Outpatient Medications  ?Medication Sig Dispense Refill  ? aspirin EC 81 MG tablet Take 81 mg by mouth daily.    ? atorvastatin (LIPITOR) 80 MG tablet Take 1 tablet (80 mg total) by mouth daily. 90 tablet 3  ? Calcium Carb-Cholecalciferol (CALCIUM 600 + D PO) Take 1,200 mg by mouth daily.    ? Cholecalciferol (DIALYVITE VITAMIN D 5000) 125 MCG (5000 UT) capsule Take 5,000 Units by mouth daily.    ? clopidogrel (PLAVIX) 75 MG tablet Take 1 tablet (75 mg total) by mouth daily. Okay to restart this medication on 07/23/2021 90 tablet 2  ? metoprolol tartrate (LOPRESSOR) 25 MG tablet Take 1 tablet (25 mg total) by mouth 2 (two) times daily. 180 tablet 3  ? nitroGLYCERIN (NITROSTAT) 0.4 MG SL tablet Place 0.4 mg under the tongue every 5 (five) minutes as needed for chest pain.    ? prochlorperazine (COMPAZINE) 10 MG tablet Take 1 tablet (10 mg total) by mouth every 6 (six) hours as needed for nausea or vomiting. 30 tablet 0  ? traZODone (DESYREL) 50 MG tablet Take 25-50 mg by mouth at bedtime as needed for sleep.    ? ?No current facility-administered medications for this visit.  ? ? ?SURGICAL HISTORY:  ?Past Surgical History:  ?Procedure Laterality Date  ?  BRONCHIAL BIOPSY  07/22/2021  ? Procedure: BRONCHIAL BIOPSIES;  Surgeon: Collene Gobble, MD;  [facility] END[facility]COPY;  Service: Cardiopulmonary;;  ? BRONCHIAL BRUSHINGS  07/22/2021  ? Procedure: BRONCHIAL BRUSHINGS;  Surgeon: Collene Gobble, MD;  Location: [facility] ENDOSCOPY;  Service: Cardiopulmonary;;  ? BRONCHIAL NEEDLE ASPIRATION BIOPSY  07/22/2021  ? Procedure: BRONCHIAL NEEDLE ASPIRATION BIOPSIES;  Surgeon: Collene Gobble, MD;  Location: [facility] ENDOSCOPY;  Service: Cardiopulmonary;;  ? CORONARY ARTERY BYPASS GRAFT N/A 05/13/2021  ?  Procedure: CORONARY ARTERY BYPASS GRAFTING (CABG) TIMES 3  , ON PUMP, USING LEFT INTERNAL MAMMARY ARTERY AND RIGHT GREATER SAPHENOUS VEIN HARVESTED ENDOSCOPICALLY;  Surgeon: Lajuana Matte, MD;  Location: [place];  Service: Open Heart Surgery;  Laterality: N/A;  ? ENDOVEIN HARVEST OF GREATER SAPHENOUS VEIN Right 05/13/2021  ? Procedure: ENDOVEIN HARVEST OF GREATER SAPHENOUS VEIN;  Surgeon: Lajuana Matte, MD;  Location: [place];  Service: Open Heart Surgery;  Laterality: Right;  ? epidural    ? back pain  ? IR CT SPINE LTD  06/18/2020  ? IR KYPHO LUMBAR INC FX REDUCE BONE BX UNI/BIL CANNULATION INC/IMAGING  06/18/2020  ? LEFT HEART CATH AND CORONARY ANGIOGRAPHY N/A 05/06/2021  ? Procedure: LEFT HEART CATH AND CORONARY ANGIOGRAPHY;  Surgeon: [name], MD;  Location: [place] CV LAB;  Service: Cardiovascular;  Laterality: N/A;  ? LUNG BIOPSY Right 05/13/2021  ? Procedure: RIGHT LUNG BIOPSY;  Surgeon: Lajuana Matte, MD;  Location: [place];  Service: Open Heart Surgery;  Laterality: Right;  ? MINOR HEMORRHOIDECTOMY    ? SHOULDER SURGERY    ? TEE WITHOUT CARDIOVERSION N/A 05/13/2021  ? Procedure: TRANSESOPHAGEAL ECHOCARDIOGRAM (TEE);  Surgeon: Lajuana Matte, MD;  Location: [place];  Service: Open Heart Surgery;  Laterality: N/A;  ? TRANSURETHRAL RESECTION OF BLADDER TUMOR  2014  ? TRANSURETHRAL RESECTION OF BLADDER TUMOR N/A 05/19/2018  ? Procedure: TRANSURETHRAL RESECTION OF BLADDER TUMOR (TURBT) AND (INTRAVESICAL GEMCITABINE INSTILLED IN BLADDER IN PACU) ;  Surgeon: Franchot Gallo, MD;  Location: [facility];  Service: Urology;  Laterality: N/A;  30 MINS  ? VIDEO BRONCHOSCOPY WITH ENDOBRONCHIAL ULTRASOUND Bilateral 07/22/2021  ? Procedure: VIDEO BRONCHOSCOPY WITH ENDOBRONCHIAL ULTRASOUND;  Surgeon: Collene Gobble, MD;  Location: [facility] ENDOSCOPY;  Service: Cardiopulmonary;  Laterality: Bilateral;  ? ? ?REVIEW OF SYSTEMS:  Constitutional: positive for fatigue ?Eyes: negative ?Ears, nose, mouth, throat, and face:  negative ?Respiratory: positive for cough ?Cardiovascular: negative ?Gastrointestinal: negative ?Genitourinary:negative ?Integument/breast: negative ?Hematologic/lymphatic: negative ?Musculoskeletal:negative ?Neurological: negative ?Behavioral/Psych: negative ?Endocrine: negative ?Allergic/Immunologic: negative  ? ?PHYSICAL EXAMINATION: General appearance: alert, cooperative, fatigued, and no distress ?Head: Normocephalic, without obvious abnormality, atraumatic ?Neck: no adenopathy, no JVD, supple, symmetrical, trachea midline, and thyroid not enlarged, symmetric, no tenderness/mass/nodules ?Lymph nodes: Cervical, supraclavicular, and axillary nodes normal. ?Resp: clear to auscultation bilaterally ?Back: symmetric, no curvature. ROM normal. No CVA tenderness. ?Cardio: regular rate and rhythm, S1, S2 normal, no murmur, click, rub or gallop ?GI: soft, non-tender; bowel sounds normal; no masses,  no organomegaly ?Extremities: extremities normal, atraumatic, no cyanosis or edema ?Neurologic: Alert and oriented X 3, normal strength and tone. Normal symmetric reflexes. Normal coordination and gait ? ?ECOG PERFORMANCE STATUS: 1 - Symptomatic but completely ambulatory ? ?Blood pressure 120/68, pulse 71, temperature 97.8 ?F (36.6 ?C), temperature source Oral, resp. rate 18, height 5' 10" (1.778 m), weight 176 lb 3.2 oz (79.9 kg), SpO2 97 %. ? ?LABORATORY DATA: ?Lab Results  ?Component Value Date  ? WBC 9.0 07/30/2021  ? HGB  12.4 (L) 07/30/2021  ? HCT 38.0 (L) 07/30/2021  ? MCV 90.9 07/30/2021  ? PLT 251 07/30/2021  ? ? ?  Chemistry   ?   ?Component Value Date/Time  ? NA 138 07/30/2021 1305  ? NA 139 05/01/2021 1146  ? K 4.7 07/30/2021 1305  ? CL 104 07/30/2021 1305  ? CO2 28 07/30/2021 1305  ? BUN 24 (H) 07/30/2021 1305  ? BUN 20 05/01/2021 1146  ? CREATININE 1.72 (H) 07/30/2021 1305  ?    ?Component Value Date/Time  ? CALCIUM 10.1 07/30/2021 1305  ? ALKPHOS 76 07/30/2021 1305  ? AST 11 (L) 07/30/2021 1305  ? ALT 9  07/30/2021 1305  ? BILITOT 0.9 07/30/2021 1305  ?  ? ? ? ?RADIOGRAPHIC STUDIES: ?MR BRAIN W WO CONTRAST ? ?Result Date: 07/08/2021 ?CLINICAL DATA:  Non-small cell lung cancer. Staging. Adenocarcinoma. Staging. EXAM

## 2021-07-30 NOTE — Telephone Encounter (Signed)
4/19 @ 2:57 pm left voicemail for patient to call our office. ?

## 2021-07-30 NOTE — Progress Notes (Signed)
Oncology Nurse Navigator Documentation ? ? ?  07/30/2021  ?  2:00 PM 07/28/2021  ?  3:00 PM 07/28/2021  ?  9:00 AM 07/25/2021  ?  1:00 PM 07/18/2021  ? 11:00 AM 07/16/2021  ?  2:00 PM 07/11/2021  ? 10:00 AM  ?Oncology Nurse Navigator Flowsheets  ?Navigator Follow Up Date: 08/04/2021 07/30/2021  07/28/2021  07/24/2021 07/15/2021  ?Navigator Follow Up Reason: Appointment Review Follow-up Appointment  Appointment Review Pathology Appointment Review;Pathology;Review Note Appointment Review  ?Navigator Location [facility]  ?Navigator Encounter Type Clinic/MDC;Education Follow-up Appt Molecular Studies Appt/Treatment Plan Review Pathology Review Appt/Treatment Plan Review Appt/Treatment Plan Review  ?Patient Visit Type MedOnc;Follow-up        ?Treatment Phase Pre-Tx/Tx Discussion Pre-Tx/Tx Discussion Pre-Tx/Tx Discussion      ?Barriers/Navigation Needs Education;Coordination of Care Coordination of Care Coordination of Care Coordination of Care Coordination of Care Coordination of Care Coordination of Care  ?Education Newly Diagnosed Cancer Education;Preparing for Upcoming Surgery/ Treatment;Other        ?Interventions Coordination of Care;Education;Psycho-Social Support;Referrals/I spoke to patient and his wife today as well as daughter on the phone.  Patient treatment plan is concurrent chemo rad for 6-7 weeks.  Referral to rad onc completed per Dr. Julien Nordmann. I helped to explain treatment plan ans provided information on AVS.  Will follow up on his schedule.  Coordination of Care Coordination of Care Coordination of Care Coordination of Care Coordination of Care Coordination of Care  ?Acuity Level 3-Moderate Needs (3-4 Barriers Identified) Level 2-Minimal Needs (1-2 Barriers Identified) Level 2-Minimal Needs (1-2 Barriers Identified) Level 2-Minimal Needs (1-2 Barriers Identified) Level 2-Minimal Needs (1-2 Barriers Identified)  Level 2-Minimal Needs (1-2 Barriers Identified) Level 2-Minimal Needs (1-2 Barriers Identified)  ?Referrals Radiation        ?Coordination of Care Other Other Pathology Other Pathology;Other Other Other  ?Education Method Verbal;Other        ?Time Spent with Patient 45 30 30 30 30 30 15   ?  ?

## 2021-07-31 ENCOUNTER — Encounter: Payer: Self-pay | Admitting: *Deleted

## 2021-07-31 ENCOUNTER — Encounter: Payer: Self-pay | Admitting: Radiation Oncology

## 2021-07-31 ENCOUNTER — Other Ambulatory Visit: Payer: Self-pay

## 2021-07-31 ENCOUNTER — Ambulatory Visit
Admission: RE | Admit: 2021-07-31 | Discharge: 2021-07-31 | Disposition: A | Discharge status: Home / Self Care | Payer: Medicare HMO | Source: Ambulatory Visit | Attending: Radiation Oncology | Admitting: Radiation Oncology

## 2021-07-31 ENCOUNTER — Other Ambulatory Visit: Payer: Self-pay | Admitting: *Deleted

## 2021-07-31 VITALS — Ht 70.0 in | Wt 176.0 lb

## 2021-07-31 DIAGNOSIS — Z87891 Personal history of nicotine dependence: Secondary | ICD-10-CM | POA: Diagnosis not present

## 2021-07-31 DIAGNOSIS — C3411 Malignant neoplasm of upper lobe, right bronchus or lung: Secondary | ICD-10-CM

## 2021-07-31 NOTE — Progress Notes (Signed)
The proposed treatment discussed in cancer conference is for discussion purpose only and is not a binding recommendation. The patient was not physically examined nor present for their treatment options. Therefore, final treatment plans cannot be decided.  ?

## 2021-07-31 NOTE — Progress Notes (Signed)
Oncology Nurse Navigator Documentation ? ? ?  07/31/2021  ? 10:00 AM 07/30/2021  ?  2:00 PM 07/28/2021  ?  3:00 PM 07/28/2021  ?  9:00 AM 07/25/2021  ?  1:00 PM 07/18/2021  ? 11:00 AM 07/16/2021  ?  2:00 PM  ?Oncology Nurse Navigator Flowsheets  ?Navigator Follow Up Date: 08/18/2021 08/04/2021 07/30/2021  07/28/2021  07/24/2021  ?Navigator Follow Up Reason: Follow-up Appointment Appointment Review Follow-up Appointment  Appointment Review Pathology Appointment Review;Pathology;Review Note  ?Navigator Location [facility]  ?Navigator Encounter Type Appt/Treatment Plan Review Clinic/MDC;Education Follow-up Appt Molecular Studies Appt/Treatment Plan Review Pathology Review Appt/Treatment Plan Review  ?Patient Visit Type  MedOnc;Follow-up       ?Treatment Phase Pre-Tx/Tx Discussion Pre-Tx/Tx Discussion Pre-Tx/Tx Discussion Pre-Tx/Tx Discussion     ?Barriers/Navigation Needs Coordination of Care/I followed up on Kevin [name]'s tx plan. He is set up at this time.  Education;Coordination of Care Coordination of Care Coordination of Care Coordination of Care Coordination of Care Coordination of Care  ?Education  Newly Diagnosed Cancer Education;Preparing for Upcoming Surgery/ Treatment;Other       ?Interventions Coordination of Care Coordination of Care;Education;Psycho-Social Support;Referrals Coordination of Care Coordination of Care Coordination of Care Coordination of Care Coordination of Care  ?Acuity Level 2-Minimal Needs (1-2 Barriers Identified) Level 3-Moderate Needs (3-4 Barriers Identified) Level 2-Minimal Needs (1-2 Barriers Identified) Level 2-Minimal Needs (1-2 Barriers Identified) Level 2-Minimal Needs (1-2 Barriers Identified) Level 2-Minimal Needs (1-2 Barriers Identified) Level 2-Minimal Needs (1-2 Barriers Identified)  ?Referrals  Radiation       ?Coordination of Care Other Other Other Pathology Other  Pathology;Other Other  ?Education Method  Verbal;Other       ?Time Spent with Patient 30 45 30 30 30 30 30   ?  ?

## 2021-08-01 ENCOUNTER — Inpatient Hospital Stay: Payer: Medicare HMO

## 2021-08-01 ENCOUNTER — Other Ambulatory Visit: Payer: Self-pay

## 2021-08-04 ENCOUNTER — Ambulatory Visit
Admission: RE | Admit: 2021-08-04 | Discharge: 2021-08-04 | Disposition: A | Discharge status: Home / Self Care | Payer: Medicare HMO | Source: Ambulatory Visit | Attending: Radiation Oncology | Admitting: Radiation Oncology

## 2021-08-04 DIAGNOSIS — C3411 Malignant neoplasm of upper lobe, right bronchus or lung: Secondary | ICD-10-CM | POA: Diagnosis not present

## 2021-08-04 DIAGNOSIS — Z510 Encounter for antineoplastic radiation therapy: Secondary | ICD-10-CM | POA: Diagnosis not present

## 2021-08-04 DIAGNOSIS — Z87891 Personal history of nicotine dependence: Secondary | ICD-10-CM | POA: Diagnosis not present

## 2021-08-04 NOTE — Progress Notes (Signed)
?Radiation Oncology         [phone] ?________________________________ ? ?Name: Kevin [name] MRN: [id number]  ?Date: 07/31/2021  DOB: 1941-02-08 ? ?WU:[id number], Costella Hatcher, MD  Kevin Bears, MD    ? ?REFERRING PHYSICIAN: Curt Bears, MD ? ? ?DIAGNOSIS: The encounter diagnosis was Malignant neoplasm of right upper lobe of lung ([place]). ? ? ?HISTORY OF PRESENT ILLNESS::Kevin [name] is a 81 y.o. male who is seen for an initial consultation visit regarding the patient's diagnosis of right-sided lung cancer.  The patient was found to have some pulmonary findings when he underwent a coronary artery bypass graft under the care of of Dr. Kipp Brood.  This led to a right upper lobe wedge resection.  A tumor measured 3.3 cm with visceral pleural involvement and a close resection margin at less than 0.1 cm.  A PET scan has subsequently been performed which showed a right upper lobe nodule adjacent to the wedge resection site.  Additional right hilar, right subcarinal, and left infrahilar lymph nodes were seen with the latter potentially being reactive versus involved.  The patient's case has been discussed in detail in multidisciplinary thoracic conference.  A definitive course of chemoradiation treatment has been recommended for the patient.  He will be proceeding with a brain MRI scan in the near future as well.  I have therefore been asked to see the patient for consideration of radiation treatment. ? ? ? ?PREVIOUS RADIATION THERAPY: No ? ? ?PAST MEDICAL HISTORY:  has a past medical history of Arthritis, Coronary artery disease, and Hyperlipidemia.   ? ? ?PAST SURGICAL HISTORY: ?Past Surgical History:  ?Procedure Laterality Date  ? BRONCHIAL BIOPSY  07/22/2021  ? Procedure: BRONCHIAL BIOPSIES;  Surgeon: Collene Gobble, MD;  Location: [facility] ENDOSCOPY;  Service: Cardiopulmonary;;  ? BRONCHIAL BRUSHINGS  07/22/2021  ? Procedure: BRONCHIAL BRUSHINGS;  Surgeon: Collene Gobble, MD;  Location: [facility] ENDOSCOPY;  Service:  Cardiopulmonary;;  ? BRONCHIAL NEEDLE ASPIRATION BIOPSY  07/22/2021  ? Procedure: BRONCHIAL NEEDLE ASPIRATION BIOPSIES;  Surgeon: Collene Gobble, MD;  Location: [facility] ENDOSCOPY;  Service: Cardiopulmonary;;  ? CORONARY ARTERY BYPASS GRAFT N/A 05/13/2021  ? Procedure: CORONARY ARTERY BYPASS GRAFTING (CABG) TIMES 3  , ON PUMP, USING LEFT INTERNAL MAMMARY ARTERY AND RIGHT GREATER SAPHENOUS VEIN HARVESTED ENDOSCOPICALLY;  Surgeon: Kevin Matte, MD;  Location: [place];  Service: Open Heart Surgery;  Laterality: N/A;  ? ENDOVEIN HARVEST OF GREATER SAPHENOUS VEIN Right 05/13/2021  ? Procedure: ENDOVEIN HARVEST OF GREATER SAPHENOUS VEIN;  Surgeon: Kevin Matte, MD;  Location: [place];  Service: Open Heart Surgery;  Laterality: Right;  ? epidural    ? back pain  ? IR CT SPINE LTD  06/18/2020  ? IR KYPHO LUMBAR INC FX REDUCE BONE BX UNI/BIL CANNULATION INC/IMAGING  06/18/2020  ? LEFT HEART CATH AND CORONARY ANGIOGRAPHY N/A 05/06/2021  ? Procedure: LEFT HEART CATH AND CORONARY ANGIOGRAPHY;  Surgeon: Kevin Prows, MD;  Location: [place] CV LAB;  Service: Cardiovascular;  Laterality: N/A;  ? LUNG BIOPSY Right 05/13/2021  ? Procedure: RIGHT LUNG BIOPSY;  Surgeon: Kevin Matte, MD;  Location: [place];  Service: Open Heart Surgery;  Laterality: Right;  ? MINOR HEMORRHOIDECTOMY    ? SHOULDER SURGERY    ? TEE WITHOUT CARDIOVERSION N/A 05/13/2021  ? Procedure: TRANSESOPHAGEAL ECHOCARDIOGRAM (TEE);  Surgeon: Kevin Matte, MD;  Location: [place];  Service: Open Heart Surgery;  Laterality: N/A;  ? TRANSURETHRAL RESECTION OF BLADDER TUMOR  2014  ? TRANSURETHRAL RESECTION  OF BLADDER TUMOR N/A 05/19/2018  ? Procedure: TRANSURETHRAL RESECTION OF BLADDER TUMOR (TURBT) AND (INTRAVESICAL GEMCITABINE INSTILLED IN BLADDER IN PACU) ;  Surgeon: Kevin Gallo, MD;  Location[facility];  Service: Urology;  Laterality: N/A;  30 MINS  ? VIDEO BRONCHOSCOPY WITH ENDOBRONCHIAL ULTRASOUND Bilateral 07/22/2021  ? Procedure: VIDEO BRONCHOSCOPY  WITH ENDOBRONCHIAL ULTRASOUND;  Surgeon: Collene Gobble, MD;  Location: [facility] ENDOSCOPY;  Service: Cardiopulmonary;  Laterality: Bilateral;  ? ? ? ?FAMILY HISTORY: family history includes Alzheimer's disease (age of onset: 73) in his mother; Congestive Heart Failure (age of onset: 77) in his father; Heart disease (age of onset: 50) in his brother; Heart failure (age of onset: 47) in his brother; Heart failure (age of onset: 31) in his brother. ? ? ?SOCIAL HISTORY:  reports that he quit smoking about 26 years ago. His smoking use included cigarettes. He has a 30.00 pack-year smoking history. He has never used smokeless tobacco. He reports that he does not drink alcohol and does not use drugs. ? ? ?ALLERGIES: Patient has no known allergies. ? ? ?MEDICATIONS:  ?Current Outpatient Medications  ?Medication Sig Dispense Refill  ? aspirin EC 81 MG tablet Take 81 mg by mouth daily.    ? atorvastatin (LIPITOR) 80 MG tablet Take 1 tablet (80 mg total) by mouth daily. 90 tablet 3  ? Calcium Carb-Cholecalciferol (CALCIUM 600 + D PO) Take 1,200 mg by mouth daily.    ? Cholecalciferol (DIALYVITE VITAMIN D 5000) 125 MCG (5000 UT) capsule Take 5,000 Units by mouth daily.    ? clopidogrel (PLAVIX) 75 MG tablet Take 1 tablet (75 mg total) by mouth daily. Okay to restart this medication on 07/23/2021 90 tablet 2  ? metoprolol tartrate (LOPRESSOR) 25 MG tablet Take 1 tablet (25 mg total) by mouth 2 (two) times daily. 180 tablet 3  ? nitroGLYCERIN (NITROSTAT) 0.4 MG SL tablet Place 0.4 mg under the tongue every 5 (five) minutes as needed for chest pain.    ? prochlorperazine (COMPAZINE) 10 MG tablet Take 1 tablet (10 mg total) by mouth every 6 (six) hours as needed for nausea or vomiting. 30 tablet 0  ? traZODone (DESYREL) 50 MG tablet Take 25-50 mg by mouth at bedtime as needed for sleep.    ? ?No current facility-administered medications for this encounter.  ? ? ? ?REVIEW OF SYSTEMS:  A 15 point review of systems is documented in the  electronic medical record. This was obtained by the nursing staff. However, I reviewed this with the patient to discuss relevant findings and make appropriate changes.  Pertinent items are noted in HPI. ?  ? ?PHYSICAL EXAM:  height is 5\' 10"  (1.778 m) and weight is 176 lb (79.8 kg).   ?ECOG = 1 ? ?0 - Asymptomatic (Fully active, able to carry on all predisease activities without restriction) ? ?1 - Symptomatic but completely ambulatory (Restricted in physically strenuous activity but ambulatory and able to carry out work of a light or sedentary nature. For example, light housework, office work) ? ?2 - Symptomatic, <50% in bed during the day (Ambulatory and capable of all self care but unable to carry out any work activities. Up and about more than 50% of waking hours) ? ?3 - Symptomatic, >50% in bed, but not bedbound (Capable of only limited self-care, confined to bed or chair 50% or more of waking hours) ? ?4 - Bedbound (Completely disabled. Cannot carry on any self-care. Totally confined to bed or chair) ? ?5 - Death ? ?  Oken MM, Creech RH, Tormey DC, et al. [phone]). "Toxicity and response criteria of the [facility] [employer]". [facility] Oncol. 5 (6): 649-55 ? ?Alert, no distress ? ? ?LABORATORY DATA:  ?Lab Results  ?Component Value Date  ? WBC 9.0 07/30/2021  ? HGB 12.4 (L) 07/30/2021  ? HCT 38.0 (L) 07/30/2021  ? MCV 90.9 07/30/2021  ? PLT 251 07/30/2021  ? ?Lab Results  ?Component Value Date  ? NA 138 07/30/2021  ? K 4.7 07/30/2021  ? CL 104 07/30/2021  ? CO2 28 07/30/2021  ? ?Lab Results  ?Component Value Date  ? ALT 9 07/30/2021  ? AST 11 (L) 07/30/2021  ? ALKPHOS 76 07/30/2021  ? BILITOT 0.9 07/30/2021  ? ?  ? ?RADIOGRAPHY: MR BRAIN W WO CONTRAST ? ?Result Date: 07/08/2021 ?CLINICAL DATA:  Non-small cell lung cancer. Staging. Adenocarcinoma. Staging. EXAM: MRI HEAD WITHOUT AND WITH CONTRAST TECHNIQUE: Multiplanar, multiecho pulse sequences of the brain and surrounding structures were obtained  without and with intravenous contrast. CONTRAST:  38mL GADAVIST GADOBUTROL 1 MMOL/ML IV SOLN COMPARISON:  None. FINDINGS: Brain: Age related volume loss. Mild chronic small-vessel ischemic change of the pons and cer

## 2021-08-05 NOTE — Progress Notes (Signed)
Pharmacist Chemotherapy Monitoring - Initial Assessment   ? ?Anticipated start date: 08/12/21  ? ?The following has been reviewed per standard work regarding the patient's treatment regimen: ?The patient's diagnosis, treatment plan and drug doses, and organ/hematologic function ?Lab orders and baseline tests specific to treatment regimen  ?The treatment plan start date, drug sequencing, and pre-medications ?Prior authorization status  ?Patient's documented medication list, including drug-drug interaction screen and prescriptions for anti-emetics and supportive care specific to the treatment regimen ?The drug concentrations, fluid compatibility, administration routes, and timing of the medications to be used ?The patient's access for treatment and lifetime cumulative dose history, if applicable  ?The patient's medication allergies and previous infusion related reactions, if applicable  ? ?Changes made to treatment plan:  ?treatment plan date ? ?Follow up needed:  ?N/A ? ? ?Kevin [name] [place], [place], ?08/05/2021  8:33 AM  ?

## 2021-08-08 DIAGNOSIS — C3411 Malignant neoplasm of upper lobe, right bronchus or lung: Secondary | ICD-10-CM | POA: Diagnosis not present

## 2021-08-08 DIAGNOSIS — Z87891 Personal history of nicotine dependence: Secondary | ICD-10-CM | POA: Diagnosis not present

## 2021-08-08 DIAGNOSIS — Z510 Encounter for antineoplastic radiation therapy: Secondary | ICD-10-CM | POA: Diagnosis not present

## 2021-08-11 ENCOUNTER — Ambulatory Visit
Admission: RE | Admit: 2021-08-11 | Discharge: 2021-08-11 | Disposition: A | Discharge status: Home / Self Care | Payer: Medicare HMO | Source: Ambulatory Visit | Attending: Radiation Oncology | Admitting: Radiation Oncology

## 2021-08-11 ENCOUNTER — Ambulatory Visit (INDEPENDENT_AMBULATORY_CARE_PROVIDER_SITE_OTHER): Payer: Medicare HMO | Admitting: Pulmonary Disease

## 2021-08-11 ENCOUNTER — Telehealth: Payer: Self-pay | Admitting: Nutrition

## 2021-08-11 ENCOUNTER — Encounter: Payer: Self-pay | Admitting: Pulmonary Disease

## 2021-08-11 ENCOUNTER — Other Ambulatory Visit: Payer: Self-pay

## 2021-08-11 VITALS — BP 124/60 | HR 66 | Temp 97.8°F | Ht 70.0 in | Wt 178.6 lb

## 2021-08-11 DIAGNOSIS — Z951 Presence of aortocoronary bypass graft: Secondary | ICD-10-CM

## 2021-08-11 DIAGNOSIS — C3491 Malignant neoplasm of unspecified part of right bronchus or lung: Secondary | ICD-10-CM | POA: Insufficient documentation

## 2021-08-11 DIAGNOSIS — R918 Other nonspecific abnormal finding of lung field: Secondary | ICD-10-CM | POA: Diagnosis present

## 2021-08-11 DIAGNOSIS — E860 Dehydration: Secondary | ICD-10-CM | POA: Diagnosis not present

## 2021-08-11 DIAGNOSIS — C3411 Malignant neoplasm of upper lobe, right bronchus or lung: Secondary | ICD-10-CM | POA: Diagnosis not present

## 2021-08-11 DIAGNOSIS — R599 Enlarged lymph nodes, unspecified: Secondary | ICD-10-CM

## 2021-08-11 DIAGNOSIS — Z510 Encounter for antineoplastic radiation therapy: Secondary | ICD-10-CM | POA: Diagnosis not present

## 2021-08-11 DIAGNOSIS — Z87891 Personal history of nicotine dependence: Secondary | ICD-10-CM | POA: Diagnosis not present

## 2021-08-11 LAB — RAD ONC ARIA SESSION SUMMARY
Course Elapsed Days: 0
Plan Fractions Treated to Date: 1
Plan Prescribed Dose Per Fraction: 2 Gy
Plan Total Fractions Prescribed: 30
Plan Total Prescribed Dose: 60 Gy
Reference Point Dosage Given to Date: 2 Gy
Reference Point Session Dosage Given: 2 Gy
Session Number: 1

## 2021-08-11 MED ORDER — TRELEGY ELLIPTA 100-62.5-25 MCG/ACT IN AEPB
1.0000 | INHALATION_SPRAY | Freq: Every day | RESPIRATORY_TRACT | 0 refills | Status: DC
Start: 2021-08-11 — End: 2021-10-20

## 2021-08-11 MED ORDER — ALBUTEROL SULFATE HFA 108 (90 BASE) MCG/ACT IN AERS: 1.0000 | INHALATION_SPRAY | Freq: Four times a day (QID) | RESPIRATORY_TRACT | 3 refills | Status: DC | PRN

## 2021-08-11 NOTE — Progress Notes (Signed)
? ?Synopsis: Referred in April 2023 for adenopathy by [name], MD ? ?Subjective:  ? ?PATIENT ID: Kevin [name] GENDER: male DOB: [birth year], MRN: [id number] ? ?Chief Complaint  ?Patient presents with  ? Follow-up  ?  Patient feels like he is doing good, no concerns  ? ? ?This is a 81 year old gentleman, Past medical history of coronary artery bypass grafting by Dr. Kipp Brood, was found to have a lung mass at the time of his surgical evaluation.  He had a Wedge resection of the right upper lobe which was consistent with malignancy.  Patient has establish care with medical oncology.  Last seen in the office on 07/09/2021, medical oncology office note reviewed.  Patient diagnosed with a stage IIIb T3, N2M0 non-small cell lung cancer, adenocarcinoma of the right upper lobe tumor at 3.3 cm with visceral pleural involvement.  PET scan concerning for nodal disease.  There was multidisciplinary discussion regarding his case last week at medical thoracic oncology conference.  Nodal disease potentially within the mediastinum and hilum but also possibly considered reactive.  At this changes the plan for consideration for radiation treatment which could be definitive to the upper lobe lesion versus getting more mediastinal radiation.  Radiation oncology was concerned about giving treatment to the mediastinum with a recent sternotomy.  However the patient is still on Plavix.  Patient was referred to me for consideration of videobronchoscope with endobronchial ultrasound of the small lymph nodes to confirm nodal disease. ? ?OV 08/11/2021: Here today for follow-up after biopsy.Patient underwent video bronchoscopy with endobronchial ultrasound and nodal sampling by Dr. Lamonte Sakai. he is not using any inhalers at this time.  From a respiratory standpoint he is able to complete all of his activities of daily living.  He has a trip planned in September.  He is starting his chemoradiation next week. ? ? ?Past Medical History:  ?Diagnosis  Date  ? Arthritis   ? Coronary artery disease   ? Hyperlipidemia   ?  ? ?Family History  ?Problem Relation Age of Onset  ? Alzheimer's disease Mother 23  ? Congestive Heart Failure Father 107  ? Heart disease Brother 21  ? Heart failure Brother 38  ? Heart failure Brother 60  ?  ? ?Past Surgical History:  ?Procedure Laterality Date  ? BRONCHIAL BIOPSY  07/22/2021  ? Procedure: BRONCHIAL BIOPSIES;  Surgeon: Collene Gobble, MD;  Location: [facility] ENDOSCOPY;  Service: Cardiopulmonary;;  ? BRONCHIAL BRUSHINGS  07/22/2021  ? Procedure: BRONCHIAL BRUSHINGS;  Surgeon: Collene Gobble, MD;  Location: [facility] ENDOSCOPY;  Service: Cardiopulmonary;;  ? BRONCHIAL NEEDLE ASPIRATION BIOPSY  07/22/2021  ? Procedure: BRONCHIAL NEEDLE ASPIRATION BIOPSIES;  Surgeon: Collene Gobble, MD;  Location: [facility] ENDOSCOPY;  Service: Cardiopulmonary;;  ? CORONARY ARTERY BYPASS GRAFT N/A 05/13/2021  ? Procedure: CORONARY ARTERY BYPASS GRAFTING (CABG) TIMES 3  , ON PUMP, USING LEFT INTERNAL MAMMARY ARTERY AND RIGHT GREATER SAPHENOUS VEIN HARVESTED ENDOSCOPICALLY;  Surgeon: Lajuana Matte, MD;  Location: [place];  Service: Open Heart Surgery;  Laterality: N/A;  ? ENDOVEIN HARVEST OF GREATER SAPHENOUS VEIN Right 05/13/2021  ? Procedure: ENDOVEIN HARVEST OF GREATER SAPHENOUS VEIN;  Surgeon: Lajuana Matte, MD;  Location: [place];  Service: Open Heart Surgery;  Laterality: Right;  ? epidural    ? back pain  ? IR CT SPINE LTD  06/18/2020  ? IR KYPHO LUMBAR INC FX REDUCE BONE BX UNI/BIL CANNULATION INC/IMAGING  06/18/2020  ? LEFT HEART CATH AND CORONARY ANGIOGRAPHY N/A 05/06/2021  ?  Procedure: LEFT HEART CATH AND CORONARY ANGIOGRAPHY;  Surgeon: [name], MD;  Location: [place] CV LAB;  Service: Cardiovascular;  Laterality: N/A;  ? LUNG BIOPSY Right 05/13/2021  ? Procedure: RIGHT LUNG BIOPSY;  Surgeon: Lajuana Matte, MD;  Location: [place];  Service: Open Heart Surgery;  Laterality: Right;  ? MINOR HEMORRHOIDECTOMY    ? SHOULDER SURGERY    ? TEE WITHOUT  CARDIOVERSION N/A 05/13/2021  ? Procedure: TRANSESOPHAGEAL ECHOCARDIOGRAM (TEE);  Surgeon: Lajuana Matte, MD;  Location: [place];  Service: Open Heart Surgery;  Laterality: N/A;  ? TRANSURETHRAL RESECTION OF BLADDER TUMOR  2014  ? TRANSURETHRAL RESECTION OF BLADDER TUMOR N/A 05/19/2018  ? Procedure: TRANSURETHRAL RESECTION OF BLADDER TUMOR (TURBT) AND (INTRAVESICAL GEMCITABINE INSTILLED IN BLADDER IN PACU) ;  Surgeon: Franchot Gallo, MD;  Location: [facility];  Service: Urology;  Laterality: N/A;  30 MINS  ? VIDEO BRONCHOSCOPY WITH ENDOBRONCHIAL ULTRASOUND Bilateral 07/22/2021  ? Procedure: VIDEO BRONCHOSCOPY WITH ENDOBRONCHIAL ULTRASOUND;  Surgeon: Collene Gobble, MD;  Location: [facility] ENDOSCOPY;  Service: Cardiopulmonary;  Laterality: Bilateral;  ? ? ?Social History  ? ?Socioeconomic History  ? Marital status: Married  ?  Spouse name: Not on file  ? Number of children: 2  ? Years of education: Not on file  ? Highest education level: Not on file  ?Occupational History  ? Not on file  ?Tobacco Use  ? Smoking status: Former  ?  Packs/day: 1.00  ?  Years: 30.00  ?  Pack years: 30.00  ?  Types: Cigarettes  ?  Quit date: 04/14/1995  ?  Years since quitting: 26.3  ? Smokeless tobacco: Never  ?Vaping Use  ? Vaping Use: Never used  ?Substance and Sexual Activity  ? Alcohol use: Never  ? Drug use: Never  ? Sexual activity: Not on file  ?Other Topics Concern  ? Not on file  ?Social History Narrative  ? Not on file  ? ?Social Determinants of Health  ? ?Financial Resource Strain: Not on file  ?Food Insecurity: Not on file  ?Transportation Needs: Not on file  ?Physical Activity: Not on file  ?Stress: Not on file  ?Social Connections: Not on file  ?Intimate Partner Violence: Not on file  ?  ? ?No Known Allergies  ? ?Outpatient Medications Prior to Visit  ?Medication Sig Dispense Refill  ? aspirin EC 81 MG tablet Take 81 mg by mouth daily.    ? atorvastatin (LIPITOR) 80 MG tablet Take 1 tablet (80 mg total) by mouth daily. 90 tablet  3  ? Calcium Carb-Cholecalciferol (CALCIUM 600 + D PO) Take 1,200 mg by mouth daily.    ? Cholecalciferol (DIALYVITE VITAMIN D 5000) 125 MCG (5000 UT) capsule Take 5,000 Units by mouth daily.    ? clopidogrel (PLAVIX) 75 MG tablet Take 1 tablet (75 mg total) by mouth daily. Okay to restart this medication on 07/23/2021 90 tablet 2  ? metoprolol tartrate (LOPRESSOR) 25 MG tablet Take 1 tablet (25 mg total) by mouth 2 (two) times daily. 180 tablet 3  ? nitroGLYCERIN (NITROSTAT) 0.4 MG SL tablet Place 0.4 mg under the tongue every 5 (five) minutes as needed for chest pain.    ? prochlorperazine (COMPAZINE) 10 MG tablet Take 1 tablet (10 mg total) by mouth every 6 (six) hours as needed for nausea or vomiting. 30 tablet 0  ? traZODone (DESYREL) 50 MG tablet Take 25-50 mg by mouth at bedtime as needed for sleep.    ? ?No facility-administered  medications prior to visit.  ? ? ?Review of Systems  ?Constitutional:  Negative for chills, fever, malaise/fatigue and weight loss.  ?HENT:  Negative for hearing loss, sore throat and tinnitus.   ?Eyes:  Negative for blurred vision and double vision.  ?Respiratory:  Positive for shortness of breath. Negative for cough, hemoptysis, sputum production, wheezing and stridor.   ?Cardiovascular:  Negative for chest pain, palpitations, orthopnea, leg swelling and PND.  ?Gastrointestinal:  Negative for abdominal pain, constipation, diarrhea, heartburn, nausea and vomiting.  ?Genitourinary:  Negative for dysuria, hematuria and urgency.  ?Musculoskeletal:  Negative for joint pain and myalgias.  ?Skin:  Negative for itching and rash.  ?Neurological:  Negative for dizziness, tingling, weakness and headaches.  ?Endo/Heme/Allergies:  Negative for environmental allergies. Does not bruise/bleed easily.  ?Psychiatric/Behavioral:  Negative for depression. The patient is not nervous/anxious and does not have insomnia.   ?All other systems reviewed and are negative. ? ? ?Objective:  ?Physical  Exam ?Vitals reviewed.  ?Constitutional:   ?   General: He is not in acute distress. ?   Appearance: He is well-developed.  ?HENT:  ?   Head: Normocephalic and atraumatic.  ?Eyes:  ?   General: No scleral icterus.

## 2021-08-11 NOTE — Telephone Encounter (Signed)
Wife questioning "how" to wash fruits and vegetables. ?Educated to wash with running water and small brush if it is firm. (Apples, melons, etc.) Running water is sufficient for tender fruits and vegetables such as berries. No evidence to support need for special vegetable wash. ?

## 2021-08-11 NOTE — Patient Instructions (Signed)
Thank you for visiting Dr. Valeta Harms at [facility]. ?Today we recommend the following: ? ?Samples today of Trelegy ? ?Return in about 1 year (around 08/12/2022), or if symptoms worsen or fail to improve, for with APP or Dr. Valeta Harms. ? ? ? ?Please do your part to reduce the spread of COVID-19.  ? ?

## 2021-08-12 ENCOUNTER — Ambulatory Visit
Admission: RE | Admit: 2021-08-12 | Discharge: 2021-08-12 | Disposition: A | Discharge status: Home / Self Care | Payer: Medicare HMO | Source: Ambulatory Visit | Attending: Radiation Oncology | Admitting: Radiation Oncology

## 2021-08-12 ENCOUNTER — Other Ambulatory Visit: Payer: Self-pay

## 2021-08-12 ENCOUNTER — Inpatient Hospital Stay: Payer: Medicare HMO

## 2021-08-12 ENCOUNTER — Inpatient Hospital Stay: Payer: Medicare HMO | Attending: Internal Medicine

## 2021-08-12 VITALS — BP 140/68 | HR 68 | Temp 98.2°F | Resp 18 | Wt 179.2 lb

## 2021-08-12 DIAGNOSIS — R0609 Other forms of dyspnea: Secondary | ICD-10-CM | POA: Insufficient documentation

## 2021-08-12 DIAGNOSIS — Z87891 Personal history of nicotine dependence: Secondary | ICD-10-CM | POA: Diagnosis not present

## 2021-08-12 DIAGNOSIS — R918 Other nonspecific abnormal finding of lung field: Secondary | ICD-10-CM | POA: Diagnosis not present

## 2021-08-12 DIAGNOSIS — R5383 Other fatigue: Secondary | ICD-10-CM | POA: Insufficient documentation

## 2021-08-12 DIAGNOSIS — D701 Agranulocytosis secondary to cancer chemotherapy: Secondary | ICD-10-CM | POA: Diagnosis not present

## 2021-08-12 DIAGNOSIS — C3411 Malignant neoplasm of upper lobe, right bronchus or lung: Secondary | ICD-10-CM | POA: Diagnosis not present

## 2021-08-12 DIAGNOSIS — C778 Secondary and unspecified malignant neoplasm of lymph nodes of multiple regions: Secondary | ICD-10-CM | POA: Diagnosis not present

## 2021-08-12 DIAGNOSIS — Z5111 Encounter for antineoplastic chemotherapy: Secondary | ICD-10-CM | POA: Insufficient documentation

## 2021-08-12 DIAGNOSIS — C3491 Malignant neoplasm of unspecified part of right bronchus or lung: Secondary | ICD-10-CM

## 2021-08-12 DIAGNOSIS — R059 Cough, unspecified: Secondary | ICD-10-CM | POA: Insufficient documentation

## 2021-08-12 DIAGNOSIS — Z510 Encounter for antineoplastic radiation therapy: Secondary | ICD-10-CM | POA: Diagnosis not present

## 2021-08-12 DIAGNOSIS — Z5189 Encounter for other specified aftercare: Secondary | ICD-10-CM | POA: Insufficient documentation

## 2021-08-12 LAB — CMP (CANCER CENTER ONLY)
ALT: 8 U/L (ref 0–44)
AST: 10 U/L — ABNORMAL LOW (ref 15–41)
Albumin: 4 g/dL (ref 3.5–5.0)
Alkaline Phosphatase: 70 U/L (ref 38–126)
Anion gap: 6 (ref 5–15)
BUN: 18 mg/dL (ref 8–23)
CO2: 27 mmol/L (ref 22–32)
Calcium: 9.3 mg/dL (ref 8.9–10.3)
Chloride: 105 mmol/L (ref 98–111)
Creatinine: 1.31 mg/dL — ABNORMAL HIGH (ref 0.61–1.24)
GFR, Estimated: 56 mL/min — ABNORMAL LOW (ref >60)
Glucose, Bld: 95 mg/dL (ref 70–99)
Potassium: 4.1 mmol/L (ref 3.5–5.1)
Sodium: 138 mmol/L (ref 135–145)
Total Bilirubin: 1 mg/dL (ref 0.3–1.2)
Total Protein: 6.8 g/dL (ref 6.5–8.1)

## 2021-08-12 LAB — RAD ONC ARIA SESSION SUMMARY
Course Elapsed Days: 1
Plan Fractions Treated to Date: 2
Plan Prescribed Dose Per Fraction: 2 Gy
Plan Total Fractions Prescribed: 30
Plan Total Prescribed Dose: 60 Gy
Reference Point Dosage Given to Date: 4 Gy
Reference Point Session Dosage Given: 2 Gy
Session Number: 2

## 2021-08-12 LAB — CBC WITH DIFFERENTIAL (CANCER CENTER ONLY)
Abs Immature Granulocytes: 0.01 10*3/uL (ref 0.00–0.07)
Basophils Absolute: 0 10*3/uL (ref 0.0–0.1)
Basophils Relative: 1 %
Eosinophils Absolute: 0.3 10*3/uL (ref 0.0–0.5)
Eosinophils Relative: 5 %
HCT: 38.1 % — ABNORMAL LOW (ref 39.0–52.0)
Hemoglobin: 12.6 g/dL — ABNORMAL LOW (ref 13.0–17.0)
Immature Granulocytes: 0 %
Lymphocytes Relative: 25 %
Lymphs Abs: 1.5 10*3/uL (ref 0.7–4.0)
MCH: 29.6 pg (ref 26.0–34.0)
MCHC: 33.1 g/dL (ref 30.0–36.0)
MCV: 89.6 fL (ref 80.0–100.0)
Monocytes Absolute: 0.6 10*3/uL (ref 0.1–1.0)
Monocytes Relative: 10 %
Neutro Abs: 3.6 10*3/uL (ref 1.7–7.7)
Neutrophils Relative %: 59 %
Platelet Count: 177 10*3/uL (ref 150–400)
RBC: 4.25 MIL/uL (ref 4.22–5.81)
RDW: 13.7 % (ref 11.5–15.5)
WBC Count: 6 10*3/uL (ref 4.0–10.5)
nRBC: 0 % (ref 0.0–0.2)

## 2021-08-12 MED ORDER — SODIUM CHLORIDE 0.9 % IV SOLN: Freq: Once | INTRAVENOUS | Status: AC

## 2021-08-12 MED ORDER — SODIUM CHLORIDE 0.9 % IV SOLN
130.0000 mg | Freq: Once | INTRAVENOUS | Status: AC
  Administered 2021-08-12: 130 mg via INTRAVENOUS
  Filled 2021-08-12: qty 13

## 2021-08-12 MED ORDER — PALONOSETRON HCL INJECTION 0.25 MG/5ML
0.2500 mg | Freq: Once | INTRAVENOUS | Status: AC
  Administered 2021-08-12: 0.25 mg via INTRAVENOUS
  Filled 2021-08-12: qty 5

## 2021-08-12 MED ORDER — SODIUM CHLORIDE 0.9 % IV SOLN
45.0000 mg/m2 | Freq: Once | INTRAVENOUS | Status: AC
  Administered 2021-08-12: 90 mg via INTRAVENOUS
  Filled 2021-08-12: qty 15

## 2021-08-12 MED ORDER — DIPHENHYDRAMINE HCL 50 MG/ML IJ SOLN
50.0000 mg | Freq: Once | INTRAMUSCULAR | Status: AC
  Administered 2021-08-12: 50 mg via INTRAVENOUS
  Filled 2021-08-12: qty 1

## 2021-08-12 MED ORDER — SODIUM CHLORIDE 0.9 % IV SOLN
10.0000 mg | Freq: Once | INTRAVENOUS | Status: AC
  Administered 2021-08-12: 10 mg via INTRAVENOUS
  Filled 2021-08-12: qty 10

## 2021-08-12 MED ORDER — FAMOTIDINE IN NACL 20-0.9 MG/50ML-% IV SOLN
20.0000 mg | Freq: Once | INTRAVENOUS | Status: AC
  Administered 2021-08-12: 20 mg via INTRAVENOUS
  Filled 2021-08-12: qty 50

## 2021-08-12 NOTE — Patient Instructions (Signed)
[place]  Discharge Instructions: ?Thank you for choosing [facility] to provide your oncology and hematology care.  ? ?If you have a lab appointment with the [facility], please go directly to the [facility] and check in at the registration area. ?  ?Wear comfortable clothing and clothing appropriate for easy access to any Portacath or PICC line.  ? ?We strive to give you quality time with your provider. You may need to reschedule your appointment if you arrive late (15 or more minutes).  Arriving late affects you and other patients whose appointments are after yours.  Also, if you miss three or more appointments without notifying the office, you may be dismissed from the clinic at the provider?s discretion.    ?  ?For prescription refill requests, have your pharmacy contact our office and allow 72 hours for refills to be completed.   ? ?Today you received the following chemotherapy and/or immunotherapy agents Taxol, Carboplatin    ?  ?To help prevent nausea and vomiting after your treatment, we encourage you to take your nausea medication as directed. ? ?BELOW ARE SYMPTOMS THAT SHOULD BE REPORTED IMMEDIATELY: ?*FEVER GREATER THAN 100.4 F (38 ?C) OR HIGHER ?*CHILLS OR SWEATING ?*NAUSEA AND VOMITING THAT IS NOT CONTROLLED WITH YOUR NAUSEA MEDICATION ?*UNUSUAL SHORTNESS OF BREATH ?*UNUSUAL BRUISING OR BLEEDING ?*URINARY PROBLEMS (pain or burning when urinating, or frequent urination) ?*BOWEL PROBLEMS (unusual diarrhea, constipation, pain near the anus) ?TENDERNESS IN MOUTH AND THROAT WITH OR WITHOUT PRESENCE OF ULCERS (sore throat, sores in mouth, or a toothache) ?UNUSUAL RASH, SWELLING OR PAIN  ?UNUSUAL VAGINAL DISCHARGE OR ITCHING  ? ?Items with * indicate a potential emergency and should be followed up as soon as possible or go to the Emergency Department if any problems should occur. ? ?Please show the CHEMOTHERAPY ALERT CARD or IMMUNOTHERAPY ALERT CARD at  check-in to the Emergency Department and triage nurse. ? ?Should you have questions after your visit or need to cancel or reschedule your appointment, please contact [facility]  Dept: [phone]  and follow the prompts.  Office hours are 8:00 a.m. to 4:30 p.m. Monday - Friday. Please note that voicemails left after 4:00 p.m. may not be returned until the following business day.  We are closed weekends and major holidays. You have access to a nurse at all times for urgent questions. Please call the main number to the clinic Dept: [phone] and follow the prompts. ? ? ?For any non-urgent questions, you may also contact your provider using MyChart. We now offer e-Visits for anyone 83 and older to request care online for non-urgent symptoms. For details visit [email or web address]. ?  ?Also download the MyChart app! Go to the app store, search "MyChart", open the app, select [facility], and log in with your MyChart username and password. ? ?Due to Covid, a mask is required upon entering the hospital/clinic. If you do not have a mask, one will be given to you upon arrival. For doctor visits, patients may have 1 support person aged 62 or older with them. For treatment visits, patients cannot have anyone with them due to current Covid guidelines and our immunocompromised population.  ? ?

## 2021-08-12 NOTE — Progress Notes (Signed)
Continue carbo 130mg  per Dr Julien Nordmann ?

## 2021-08-12 NOTE — Progress Notes (Signed)
[name] ?OFFICE PROGRESS NOTE ? ?[name], MD ?[address] ?[place] [address] ? ?DIAGNOSIS: Stage IIIC (T3, N3, M0) non-small cell lung cancer, adenocarcinoma presented with right upper lobe lung mass status post right upper lobe wedge resection but no lymph node dissection.  The tumor measured 3.3 cm with visceral pleural involvement and very close resection margin less than 0.1 cm from the tumor.  This was performed on May 13, 2021 in the same setting of CABG under the care of Dr. Kipp Brood.  PET scan performed in March 2023 showed a right upper lobe nodule adjacent to the wedge resection site small hypermetabolic right hilar, right subcarinal, and left infrahilar lymph nodes which could be reactive but are suspicious for nodal involvement. ?  ?Molecular studies showed no actionable mutations and PD-L1 expression was 0% ? ?PRIOR THERAPY: Right upper lobe wedge resection under the care of Dr. Kipp Brood on 05/13/2021 ? ?CURRENT THERAPY: Concurrent chemoradiation with weekly carboplatin for AUC of 2 and paclitaxel 45 Mg/M2.  First dose Aug 11, 2021. Status post 1 cycle.  ? ?INTERVAL HISTORY: ?Kevin [name] 81 y.o. male returns to the clinic today for a follow-up visit accompanied by his wife.  The patient recently underwent right upper lobe bronchoscopy and biopsy under the care of Dr. Lamonte Sakai in April 2023 and the final pathology was positive for non-small cell lung cancer, adenocarcinoma.  The patient; therefore, has started treatment with concurrent chemoradiation.  He status post his first cycle of treatment last week and he tolerated it well without any concerning adverse side effects.  He denies any fever, chills, night sweats, or unexplained weight loss.  He met with the member the nutritionist team while in the infusion room last week. His wife has been making him milkshakes. He tried protein bars but does not like the taste. He notes that he is not a big "snacker". The patient denies  any chest pain, shortness of breath, cough, or hemoptysis.  He denies any nausea, vomiting, diarrhea, or constipation.  Denies any headache or visual changes.  The patient is here today for evaluation and repeat blood work before starting cycle #2. ? ? ?MEDICAL HISTORY: ?Past Medical History:  ?Diagnosis Date  ? Arthritis   ? Coronary artery disease   ? Hyperlipidemia   ? ? ?ALLERGIES:  has No Known Allergies. ? ?MEDICATIONS:  ?Current Outpatient Medications  ?Medication Sig Dispense Refill  ? albuterol (VENTOLIN HFA) 108 (90 Base) MCG/ACT inhaler Inhale 1-2 puffs into the lungs every 6 (six) hours as needed for wheezing or shortness of breath. 18 g 3  ? aspirin EC 81 MG tablet Take 81 mg by mouth daily.    ? atorvastatin (LIPITOR) 80 MG tablet Take 1 tablet (80 mg total) by mouth daily. 90 tablet 3  ? Calcium Carb-Cholecalciferol (CALCIUM 600 + D PO) Take 1,200 mg by mouth daily.    ? Cholecalciferol (DIALYVITE VITAMIN D 5000) 125 MCG (5000 UT) capsule Take 5,000 Units by mouth daily.    ? clopidogrel (PLAVIX) 75 MG tablet Take 1 tablet (75 mg total) by mouth daily. Okay to restart this medication on 07/23/2021 90 tablet 2  ? Fluticasone-Umeclidin-Vilant (TRELEGY ELLIPTA) 100-62.5-25 MCG/ACT AEPB Inhale 1 puff into the lungs daily. 60 each 0  ? metoprolol tartrate (LOPRESSOR) 25 MG tablet Take 1 tablet (25 mg total) by mouth 2 (two) times daily. 180 tablet 3  ? nitroGLYCERIN (NITROSTAT) 0.4 MG SL tablet Place 0.4 mg under the tongue every 5 (  five) minutes as needed for chest pain.    ? prochlorperazine (COMPAZINE) 10 MG tablet Take 1 tablet (10 mg total) by mouth every 6 (six) hours as needed for nausea or vomiting. 30 tablet 0  ? traZODone (DESYREL) 50 MG tablet Take 25-50 mg by mouth at bedtime as needed for sleep.    ? ?No current facility-administered medications for this visit.  ? ? ?SURGICAL HISTORY:  ?Past Surgical History:  ?Procedure Laterality Date  ? BRONCHIAL BIOPSY  07/22/2021  ? Procedure: BRONCHIAL  BIOPSIES;  Surgeon: Collene Gobble, MD;  Location: [facility] ENDOSCOPY;  Service: Cardiopulmonary;;  ? BRONCHIAL BRUSHINGS  07/22/2021  ? Procedure: BRONCHIAL BRUSHINGS;  Surgeon: Collene Gobble, MD;  Location: [facility] ENDOSCOPY;  Service: Cardiopulmonary;;  ? BRONCHIAL NEEDLE ASPIRATION BIOPSY  07/22/2021  ? Procedure: BRONCHIAL NEEDLE ASPIRATION BIOPSIES;  Surgeon: Collene Gobble, MD;  Location: [facility] ENDOSCOPY;  Service: Cardiopulmonary;;  ? CORONARY ARTERY BYPASS GRAFT N/A 05/13/2021  ? Procedure: CORONARY ARTERY BYPASS GRAFTING (CABG) TIMES 3  , ON PUMP, USING LEFT INTERNAL MAMMARY ARTERY AND RIGHT GREATER SAPHENOUS VEIN HARVESTED ENDOSCOPICALLY;  Surgeon: Lajuana Matte, MD;  Location: [place];  Service: Open Heart Surgery;  Laterality: N/A;  ? ENDOVEIN HARVEST OF GREATER SAPHENOUS VEIN Right 05/13/2021  ? Procedure: ENDOVEIN HARVEST OF GREATER SAPHENOUS VEIN;  Surgeon: Lajuana Matte, MD;  Location: [place];  Service: Open Heart Surgery;  Laterality: Right;  ? epidural    ? back pain  ? IR CT SPINE LTD  06/18/2020  ? IR KYPHO LUMBAR INC FX REDUCE BONE BX UNI/BIL CANNULATION INC/IMAGING  06/18/2020  ? LEFT HEART CATH AND CORONARY ANGIOGRAPHY N/A 05/06/2021  ? Procedure: LEFT HEART CATH AND CORONARY ANGIOGRAPHY;  Surgeon: [name], MD;  Location: [place] CV LAB;  Service: Cardiovascular;  Laterality: N/A;  ? LUNG BIOPSY Right 05/13/2021  ? Procedure: RIGHT LUNG BIOPSY;  Surgeon: Lajuana Matte, MD;  Location: [place];  Service: Open Heart Surgery;  Laterality: Right;  ? MINOR HEMORRHOIDECTOMY    ? SHOULDER SURGERY    ? TEE WITHOUT CARDIOVERSION N/A 05/13/2021  ? Procedure: TRANSESOPHAGEAL ECHOCARDIOGRAM (TEE);  Surgeon: Lajuana Matte, MD;  Location: [place];  Service: Open Heart Surgery;  Laterality: N/A;  ? TRANSURETHRAL RESECTION OF BLADDER TUMOR  2014  ? TRANSURETHRAL RESECTION OF BLADDER TUMOR N/A 05/19/2018  ? Procedure: TRANSURETHRAL RESECTION OF BLADDER TUMOR (TURBT) AND (INTRAVESICAL GEMCITABINE INSTILLED  IN BLADDER IN PACU) ;  Surgeon: Franchot Gallo, MD;  [facility];  Service: Urology;  Laterality: N/A;  30 MINS  ? VIDEO BRONCHOSCOPY WITH ENDOBRONCHIAL ULTRASOUND Bilateral 07/22/2021  ? Procedure: VIDEO BRONCHOSCOPY WITH ENDOBRONCHIAL ULTRASOUND;  Surgeon: Collene Gobble, MD;  Location: [facility] ENDOSCOPY;  Service: Cardiopulmonary;  Laterality: Bilateral;  ? ? ?REVIEW OF SYSTEMS:   ?Review of Systems  ?Constitutional: Negative for appetite change, chills, fatigue, fever and unexpected weight change.  ?HENT: Negative for mouth sores, nosebleeds, sore throat and trouble swallowing.   ?Eyes: Negative for eye problems and icterus.  ?Respiratory: Negative for cough, hemoptysis, shortness of breath and wheezing.   ?Cardiovascular: Negative for chest pain and leg swelling.  ?Gastrointestinal: Negative for abdominal pain, constipation, diarrhea, nausea and vomiting.  ?Genitourinary: Negative for bladder incontinence, difficulty urinating, dysuria, frequency and hematuria.   ?Musculoskeletal: Negative for back pain, gait problem, neck pain and neck stiffness.  ?Skin: Negative for itching and rash.  ?Neurological: Negative for dizziness, extremity weakness, gait problem, headaches, light-headedness and seizures.  ?Hematological: Negative  for adenopathy. Does not bruise/bleed easily.  ?Psychiatric/Behavioral: Negative for confusion, depression and sleep disturbance. The patient is not nervous/anxious.   ? ? ?PHYSICAL EXAMINATION:  ?Blood pressure 109/63, pulse 66, temperature (!) 97.2 ?F (36.2 ?C), resp. rate 15, weight 177 lb 12.8 oz (80.6 kg), SpO2 98 %. ? ?ECOG PERFORMANCE STATUS: 1 ? ?Physical Exam  ?Constitutional: Oriented to person, place, and time and well-developed, well-nourished, and in no distress. ?HENT:  ?Head: Normocephalic and atraumatic.  ?Mouth/Throat: Oropharynx is clear and moist. No oropharyngeal exudate.  ?Eyes: Conjunctivae are normal. Right eye exhibits no discharge. Left eye exhibits no  discharge. No scleral icterus.  ?Neck: Normal range of motion. Neck supple.  ?Cardiovascular: Normal rate, regular rhythm, normal heart sounds and intact distal pulses.   ?Pulmonary/Chest: Effort normal and brea

## 2021-08-13 ENCOUNTER — Ambulatory Visit
Admission: RE | Admit: 2021-08-13 | Discharge: 2021-08-13 | Disposition: A | Discharge status: Home / Self Care | Payer: Medicare HMO | Source: Ambulatory Visit | Attending: Radiation Oncology | Admitting: Radiation Oncology

## 2021-08-13 ENCOUNTER — Other Ambulatory Visit: Payer: Self-pay

## 2021-08-13 ENCOUNTER — Telehealth: Payer: Self-pay | Admitting: *Deleted

## 2021-08-13 DIAGNOSIS — C3411 Malignant neoplasm of upper lobe, right bronchus or lung: Secondary | ICD-10-CM | POA: Diagnosis not present

## 2021-08-13 DIAGNOSIS — Z510 Encounter for antineoplastic radiation therapy: Secondary | ICD-10-CM | POA: Diagnosis not present

## 2021-08-13 DIAGNOSIS — Z87891 Personal history of nicotine dependence: Secondary | ICD-10-CM | POA: Diagnosis not present

## 2021-08-13 DIAGNOSIS — R918 Other nonspecific abnormal finding of lung field: Secondary | ICD-10-CM | POA: Diagnosis not present

## 2021-08-13 LAB — RAD ONC ARIA SESSION SUMMARY
Course Elapsed Days: 2
Plan Fractions Treated to Date: 3
Plan Prescribed Dose Per Fraction: 2 Gy
Plan Total Fractions Prescribed: 30
Plan Total Prescribed Dose: 60 Gy
Reference Point Dosage Given to Date: 6 Gy
Reference Point Session Dosage Given: 2 Gy
Session Number: 3

## 2021-08-13 NOTE — Telephone Encounter (Signed)
-----   Message from Tildon Husky, RN sent at 08/12/2021  1:32 PM EDT ----- ?Regarding: first time treatment call back- mohamed ?Patient received treatment for the first time today. He received [place] and taxol and is seen here by Dr Julien Nordmann. No issues all went well!! ? ?

## 2021-08-13 NOTE — Telephone Encounter (Signed)
Pt called back requesting a return call. ?

## 2021-08-13 NOTE — Telephone Encounter (Signed)
Called & left message for pt to return call to let us know how he has done with his treatment yesterday.   ?

## 2021-08-14 ENCOUNTER — Ambulatory Visit
Admission: RE | Admit: 2021-08-14 | Discharge: 2021-08-14 | Disposition: A | Discharge status: Home / Self Care | Payer: Medicare HMO | Source: Ambulatory Visit | Attending: Radiation Oncology | Admitting: Radiation Oncology

## 2021-08-14 ENCOUNTER — Other Ambulatory Visit: Payer: Self-pay

## 2021-08-14 ENCOUNTER — Encounter (HOSPITAL_COMMUNITY): Payer: Medicare HMO

## 2021-08-14 DIAGNOSIS — Z87891 Personal history of nicotine dependence: Secondary | ICD-10-CM | POA: Diagnosis not present

## 2021-08-14 DIAGNOSIS — Z510 Encounter for antineoplastic radiation therapy: Secondary | ICD-10-CM | POA: Diagnosis not present

## 2021-08-14 DIAGNOSIS — R918 Other nonspecific abnormal finding of lung field: Secondary | ICD-10-CM | POA: Diagnosis not present

## 2021-08-14 DIAGNOSIS — C3411 Malignant neoplasm of upper lobe, right bronchus or lung: Secondary | ICD-10-CM | POA: Diagnosis not present

## 2021-08-14 LAB — RAD ONC ARIA SESSION SUMMARY
Course Elapsed Days: 3
Plan Fractions Treated to Date: 4
Plan Prescribed Dose Per Fraction: 2 Gy
Plan Total Fractions Prescribed: 30
Plan Total Prescribed Dose: 60 Gy
Reference Point Dosage Given to Date: 8 Gy
Reference Point Session Dosage Given: 2 Gy
Session Number: 4

## 2021-08-15 ENCOUNTER — Other Ambulatory Visit: Payer: Self-pay

## 2021-08-15 ENCOUNTER — Ambulatory Visit
Admission: RE | Admit: 2021-08-15 | Discharge: 2021-08-15 | Disposition: A | Discharge status: Home / Self Care | Payer: Medicare HMO | Source: Ambulatory Visit | Attending: Radiation Oncology | Admitting: Radiation Oncology

## 2021-08-15 DIAGNOSIS — C3491 Malignant neoplasm of unspecified part of right bronchus or lung: Secondary | ICD-10-CM

## 2021-08-15 DIAGNOSIS — C3411 Malignant neoplasm of upper lobe, right bronchus or lung: Secondary | ICD-10-CM | POA: Diagnosis not present

## 2021-08-15 DIAGNOSIS — Z510 Encounter for antineoplastic radiation therapy: Secondary | ICD-10-CM | POA: Diagnosis not present

## 2021-08-15 DIAGNOSIS — C3490 Malignant neoplasm of unspecified part of unspecified bronchus or lung: Secondary | ICD-10-CM | POA: Diagnosis not present

## 2021-08-15 DIAGNOSIS — Z87891 Personal history of nicotine dependence: Secondary | ICD-10-CM | POA: Diagnosis not present

## 2021-08-15 DIAGNOSIS — R918 Other nonspecific abnormal finding of lung field: Secondary | ICD-10-CM | POA: Diagnosis not present

## 2021-08-15 LAB — RAD ONC ARIA SESSION SUMMARY
Course Elapsed Days: 4
Plan Fractions Treated to Date: 5
Plan Prescribed Dose Per Fraction: 2 Gy
Plan Total Fractions Prescribed: 30
Plan Total Prescribed Dose: 60 Gy
Reference Point Dosage Given to Date: 10 Gy
Reference Point Session Dosage Given: 2 Gy
Session Number: 5

## 2021-08-15 MED ORDER — SONAFINE EX EMUL
1.0000 "application " | Freq: Once | CUTANEOUS | Status: AC
  Administered 2021-08-15: 1 via TOPICAL

## 2021-08-15 NOTE — Progress Notes (Signed)
Pt here for patient teaching.   ? ?Pt given Radiation and You booklet, skin care instructions, and Sonafine.   ? ?Reviewed areas of pertinence such as fatigue, hair loss in treatment field, skin changes, throat changes, cough, and shortness of breath .  ? ?Pt able to give teach back of to pat skin and use unscented/gentle soap,apply Sonafine bid and avoid applying anything to skin within 4 hours of treatment.  ? ?Pt verbalizes understanding of information given and will contact nursing with any questions or concerns.   ? ? ? ? ?  ?

## 2021-08-18 ENCOUNTER — Other Ambulatory Visit: Payer: Self-pay

## 2021-08-18 ENCOUNTER — Inpatient Hospital Stay: Payer: Medicare HMO

## 2021-08-18 ENCOUNTER — Inpatient Hospital Stay (HOSPITAL_BASED_OUTPATIENT_CLINIC_OR_DEPARTMENT_OTHER): Payer: Medicare HMO | Admitting: Physician Assistant

## 2021-08-18 ENCOUNTER — Ambulatory Visit
Admission: RE | Admit: 2021-08-18 | Discharge: 2021-08-18 | Disposition: A | Discharge status: Home / Self Care | Payer: Medicare HMO | Source: Ambulatory Visit | Attending: Radiation Oncology | Admitting: Radiation Oncology

## 2021-08-18 VITALS — BP 109/63 | HR 66 | Temp 97.2°F | Resp 15 | Wt 177.8 lb

## 2021-08-18 DIAGNOSIS — R5383 Other fatigue: Secondary | ICD-10-CM | POA: Diagnosis not present

## 2021-08-18 DIAGNOSIS — Z510 Encounter for antineoplastic radiation therapy: Secondary | ICD-10-CM | POA: Diagnosis not present

## 2021-08-18 DIAGNOSIS — C3491 Malignant neoplasm of unspecified part of right bronchus or lung: Secondary | ICD-10-CM

## 2021-08-18 DIAGNOSIS — C778 Secondary and unspecified malignant neoplasm of lymph nodes of multiple regions: Secondary | ICD-10-CM | POA: Diagnosis not present

## 2021-08-18 DIAGNOSIS — D701 Agranulocytosis secondary to cancer chemotherapy: Secondary | ICD-10-CM | POA: Diagnosis not present

## 2021-08-18 DIAGNOSIS — C3411 Malignant neoplasm of upper lobe, right bronchus or lung: Secondary | ICD-10-CM | POA: Diagnosis not present

## 2021-08-18 DIAGNOSIS — Z5189 Encounter for other specified aftercare: Secondary | ICD-10-CM | POA: Diagnosis not present

## 2021-08-18 DIAGNOSIS — Z5111 Encounter for antineoplastic chemotherapy: Secondary | ICD-10-CM | POA: Diagnosis not present

## 2021-08-18 DIAGNOSIS — R059 Cough, unspecified: Secondary | ICD-10-CM | POA: Diagnosis not present

## 2021-08-18 DIAGNOSIS — R918 Other nonspecific abnormal finding of lung field: Secondary | ICD-10-CM | POA: Diagnosis not present

## 2021-08-18 DIAGNOSIS — R0609 Other forms of dyspnea: Secondary | ICD-10-CM | POA: Diagnosis not present

## 2021-08-18 DIAGNOSIS — Z87891 Personal history of nicotine dependence: Secondary | ICD-10-CM | POA: Diagnosis not present

## 2021-08-18 LAB — CBC WITH DIFFERENTIAL (CANCER CENTER ONLY)
Abs Immature Granulocytes: 0.03 10*3/uL (ref 0.00–0.07)
Basophils Absolute: 0 10*3/uL (ref 0.0–0.1)
Basophils Relative: 1 %
Eosinophils Absolute: 0.3 10*3/uL (ref 0.0–0.5)
Eosinophils Relative: 7 %
HCT: 37 % — ABNORMAL LOW (ref 39.0–52.0)
Hemoglobin: 12 g/dL — ABNORMAL LOW (ref 13.0–17.0)
Immature Granulocytes: 1 %
Lymphocytes Relative: 33 %
Lymphs Abs: 1.3 10*3/uL (ref 0.7–4.0)
MCH: 29.2 pg (ref 26.0–34.0)
MCHC: 32.4 g/dL (ref 30.0–36.0)
MCV: 90 fL (ref 80.0–100.0)
Monocytes Absolute: 0.1 10*3/uL (ref 0.1–1.0)
Monocytes Relative: 3 %
Neutro Abs: 2.2 10*3/uL (ref 1.7–7.7)
Neutrophils Relative %: 55 %
Platelet Count: 173 10*3/uL (ref 150–400)
RBC: 4.11 MIL/uL — ABNORMAL LOW (ref 4.22–5.81)
RDW: 13.5 % (ref 11.5–15.5)
WBC Count: 3.9 10*3/uL — ABNORMAL LOW (ref 4.0–10.5)
nRBC: 0 % (ref 0.0–0.2)

## 2021-08-18 LAB — RAD ONC ARIA SESSION SUMMARY
Course Elapsed Days: 7
Plan Fractions Treated to Date: 6
Plan Prescribed Dose Per Fraction: 2 Gy
Plan Total Fractions Prescribed: 30
Plan Total Prescribed Dose: 60 Gy
Reference Point Dosage Given to Date: 12 Gy
Reference Point Session Dosage Given: 2 Gy
Session Number: 6

## 2021-08-18 LAB — CMP (CANCER CENTER ONLY)
ALT: 10 U/L (ref 0–44)
AST: 12 U/L — ABNORMAL LOW (ref 15–41)
Albumin: 4 g/dL (ref 3.5–5.0)
Alkaline Phosphatase: 79 U/L (ref 38–126)
Anion gap: 3 — ABNORMAL LOW (ref 5–15)
BUN: 22 mg/dL (ref 8–23)
CO2: 28 mmol/L (ref 22–32)
Calcium: 9.1 mg/dL (ref 8.9–10.3)
Chloride: 105 mmol/L (ref 98–111)
Creatinine: 1.17 mg/dL (ref 0.61–1.24)
GFR, Estimated: >60 mL/min (ref >60)
Glucose, Bld: 95 mg/dL (ref 70–99)
Potassium: 4.3 mmol/L (ref 3.5–5.1)
Sodium: 136 mmol/L (ref 135–145)
Total Bilirubin: 1.1 mg/dL (ref 0.3–1.2)
Total Protein: 7.2 g/dL (ref 6.5–8.1)

## 2021-08-18 MED ORDER — SODIUM CHLORIDE 0.9 % IV SOLN
45.0000 mg/m2 | Freq: Once | INTRAVENOUS | Status: AC
  Administered 2021-08-18: 90 mg via INTRAVENOUS
  Filled 2021-08-18: qty 15

## 2021-08-18 MED ORDER — SODIUM CHLORIDE 0.9 % IV SOLN: Freq: Once | INTRAVENOUS | Status: AC

## 2021-08-18 MED ORDER — FAMOTIDINE IN NACL 20-0.9 MG/50ML-% IV SOLN
20.0000 mg | Freq: Once | INTRAVENOUS | Status: AC
  Administered 2021-08-18: 20 mg via INTRAVENOUS
  Filled 2021-08-18: qty 50

## 2021-08-18 MED ORDER — PALONOSETRON HCL INJECTION 0.25 MG/5ML
0.2500 mg | Freq: Once | INTRAVENOUS | Status: AC
  Administered 2021-08-18: 0.25 mg via INTRAVENOUS
  Filled 2021-08-18: qty 5

## 2021-08-18 MED ORDER — DIPHENHYDRAMINE HCL 50 MG/ML IJ SOLN
50.0000 mg | Freq: Once | INTRAMUSCULAR | Status: AC
  Administered 2021-08-18: 50 mg via INTRAVENOUS
  Filled 2021-08-18: qty 1

## 2021-08-18 MED ORDER — SODIUM CHLORIDE 0.9 % IV SOLN
10.0000 mg | Freq: Once | INTRAVENOUS | Status: AC
  Administered 2021-08-18: 10 mg via INTRAVENOUS
  Filled 2021-08-18: qty 10

## 2021-08-18 MED ORDER — SODIUM CHLORIDE 0.9 % IV SOLN
130.0000 mg | Freq: Once | INTRAVENOUS | Status: AC
  Administered 2021-08-18: 130 mg via INTRAVENOUS
  Filled 2021-08-18: qty 13

## 2021-08-18 NOTE — Patient Instructions (Addendum)
[facility]   ?Discharge Instructions: ?Thank you for choosing [facility] to provide your oncology and hematology care.  ? ?If you have a lab appointment with the [facility], please go directly to the [facility] and check in at the registration area. ?  ?Wear comfortable clothing and clothing appropriate for easy access to any Portacath or PICC line.  ? ?We strive to give you quality time with your provider. You may need to reschedule your appointment if you arrive late (15 or more minutes).  Arriving late affects you and other patients whose appointments are after yours.  Also, if you miss three or more appointments without notifying the office, you may be dismissed from the clinic at the provider?s discretion.    ?  ?For prescription refill requests, have your pharmacy contact our office and allow 72 hours for refills to be completed.   ? ?Today you received the following chemotherapy and/or immunotherapy agents Taxol, Carboplatin    ?  ?To help prevent nausea and vomiting after your treatment, we encourage you to take your nausea medication as directed. ? ?BELOW ARE SYMPTOMS THAT SHOULD BE REPORTED IMMEDIATELY: ?*FEVER GREATER THAN 100.4 F (38 ?C) OR HIGHER ?*CHILLS OR SWEATING ?*NAUSEA AND VOMITING THAT IS NOT CONTROLLED WITH YOUR NAUSEA MEDICATION ?*UNUSUAL SHORTNESS OF BREATH ?*UNUSUAL BRUISING OR BLEEDING ?*URINARY PROBLEMS (pain or burning when urinating, or frequent urination) ?*BOWEL PROBLEMS (unusual diarrhea, constipation, pain near the anus) ?TENDERNESS IN MOUTH AND THROAT WITH OR WITHOUT PRESENCE OF ULCERS (sore throat, sores in mouth, or a toothache) ?UNUSUAL RASH, SWELLING OR PAIN  ?UNUSUAL VAGINAL DISCHARGE OR ITCHING  ? ?Items with * indicate a potential emergency and should be followed up as soon as possible or go to the Emergency Department if any problems should occur. ? ?Please show the CHEMOTHERAPY ALERT CARD or IMMUNOTHERAPY ALERT CARD at  check-in to the Emergency Department and triage nurse. ? ?Should you have questions after your visit or need to cancel or reschedule your appointment, please contact [facility]  Dept: [phone]  and follow the prompts.  Office hours are 8:00 a.m. to 4:30 p.m. Monday - Friday. Please note that voicemails left after 4:00 p.m. may not be returned until the following business day.  We are closed weekends and major holidays. You have access to a nurse at all times for urgent questions. Please call the main number to the clinic Dept: [phone] and follow the prompts. ? ? ?For any non-urgent questions, you may also contact your provider using MyChart. We now offer e-Visits for anyone 29 and older to request care online for non-urgent symptoms. For details visit [email or web address]. ?  ?Also download the MyChart app! Go to the app store, search "MyChart", open the app, select [facility], and log in with your MyChart username and password. ? ?Due to Covid, a mask is required upon entering the hospital/clinic. If you do not have a mask, one will be given to you upon arrival. For doctor visits, patients may have 1 support person aged 11 or older with them. For treatment visits, patients cannot have anyone with them due to current Covid guidelines and our immunocompromised population.  ? ?

## 2021-08-19 ENCOUNTER — Other Ambulatory Visit: Payer: Self-pay

## 2021-08-19 ENCOUNTER — Ambulatory Visit
Admission: RE | Admit: 2021-08-19 | Discharge: 2021-08-19 | Disposition: A | Discharge status: Home / Self Care | Payer: Medicare HMO | Source: Ambulatory Visit | Attending: Radiation Oncology | Admitting: Radiation Oncology

## 2021-08-19 DIAGNOSIS — R918 Other nonspecific abnormal finding of lung field: Secondary | ICD-10-CM | POA: Diagnosis not present

## 2021-08-19 DIAGNOSIS — C3411 Malignant neoplasm of upper lobe, right bronchus or lung: Secondary | ICD-10-CM | POA: Diagnosis not present

## 2021-08-19 DIAGNOSIS — Z87891 Personal history of nicotine dependence: Secondary | ICD-10-CM | POA: Diagnosis not present

## 2021-08-19 DIAGNOSIS — Z510 Encounter for antineoplastic radiation therapy: Secondary | ICD-10-CM | POA: Diagnosis not present

## 2021-08-19 LAB — RAD ONC ARIA SESSION SUMMARY
Course Elapsed Days: 8
Plan Fractions Treated to Date: 7
Plan Prescribed Dose Per Fraction: 2 Gy
Plan Total Fractions Prescribed: 30
Plan Total Prescribed Dose: 60 Gy
Reference Point Dosage Given to Date: 14 Gy
Reference Point Session Dosage Given: 0.5541 Gy
Session Number: 7

## 2021-08-20 ENCOUNTER — Other Ambulatory Visit: Payer: Self-pay

## 2021-08-20 ENCOUNTER — Ambulatory Visit
Admission: RE | Admit: 2021-08-20 | Discharge: 2021-08-20 | Disposition: A | Discharge status: Home / Self Care | Payer: Medicare HMO | Source: Ambulatory Visit | Attending: Radiation Oncology | Admitting: Radiation Oncology

## 2021-08-20 DIAGNOSIS — R918 Other nonspecific abnormal finding of lung field: Secondary | ICD-10-CM | POA: Diagnosis not present

## 2021-08-20 DIAGNOSIS — Z510 Encounter for antineoplastic radiation therapy: Secondary | ICD-10-CM | POA: Diagnosis not present

## 2021-08-20 DIAGNOSIS — C3411 Malignant neoplasm of upper lobe, right bronchus or lung: Secondary | ICD-10-CM | POA: Diagnosis not present

## 2021-08-20 DIAGNOSIS — Z87891 Personal history of nicotine dependence: Secondary | ICD-10-CM | POA: Diagnosis not present

## 2021-08-20 LAB — RAD ONC ARIA SESSION SUMMARY
Course Elapsed Days: 9
Plan Fractions Treated to Date: 8
Plan Prescribed Dose Per Fraction: 2 Gy
Plan Total Fractions Prescribed: 30
Plan Total Prescribed Dose: 60 Gy
Reference Point Dosage Given to Date: 16 Gy
Reference Point Session Dosage Given: 2 Gy
Session Number: 8

## 2021-08-21 ENCOUNTER — Other Ambulatory Visit: Payer: Self-pay

## 2021-08-21 ENCOUNTER — Ambulatory Visit
Admission: RE | Admit: 2021-08-21 | Discharge: 2021-08-21 | Disposition: A | Discharge status: Home / Self Care | Payer: Medicare HMO | Source: Ambulatory Visit | Attending: Radiation Oncology | Admitting: Radiation Oncology

## 2021-08-21 DIAGNOSIS — R918 Other nonspecific abnormal finding of lung field: Secondary | ICD-10-CM | POA: Diagnosis not present

## 2021-08-21 DIAGNOSIS — C3411 Malignant neoplasm of upper lobe, right bronchus or lung: Secondary | ICD-10-CM | POA: Diagnosis not present

## 2021-08-21 DIAGNOSIS — Z510 Encounter for antineoplastic radiation therapy: Secondary | ICD-10-CM | POA: Diagnosis not present

## 2021-08-21 DIAGNOSIS — Z87891 Personal history of nicotine dependence: Secondary | ICD-10-CM | POA: Diagnosis not present

## 2021-08-21 LAB — RAD ONC ARIA SESSION SUMMARY
Course Elapsed Days: 10
Plan Fractions Treated to Date: 9
Plan Prescribed Dose Per Fraction: 2 Gy
Plan Total Fractions Prescribed: 30
Plan Total Prescribed Dose: 60 Gy
Reference Point Dosage Given to Date: 18 Gy
Reference Point Session Dosage Given: 2 Gy
Session Number: 9

## 2021-08-22 ENCOUNTER — Other Ambulatory Visit: Payer: Self-pay

## 2021-08-22 ENCOUNTER — Ambulatory Visit
Admission: RE | Admit: 2021-08-22 | Discharge: 2021-08-22 | Disposition: A | Discharge status: Home / Self Care | Payer: Medicare HMO | Source: Ambulatory Visit | Attending: Radiation Oncology | Admitting: Radiation Oncology

## 2021-08-22 DIAGNOSIS — C3411 Malignant neoplasm of upper lobe, right bronchus or lung: Secondary | ICD-10-CM | POA: Diagnosis not present

## 2021-08-22 DIAGNOSIS — Z510 Encounter for antineoplastic radiation therapy: Secondary | ICD-10-CM | POA: Diagnosis not present

## 2021-08-22 DIAGNOSIS — R918 Other nonspecific abnormal finding of lung field: Secondary | ICD-10-CM | POA: Diagnosis not present

## 2021-08-22 DIAGNOSIS — Z87891 Personal history of nicotine dependence: Secondary | ICD-10-CM | POA: Diagnosis not present

## 2021-08-22 LAB — RAD ONC ARIA SESSION SUMMARY
Course Elapsed Days: 11
Plan Fractions Treated to Date: 10
Plan Prescribed Dose Per Fraction: 2 Gy
Plan Total Fractions Prescribed: 30
Plan Total Prescribed Dose: 60 Gy
Reference Point Dosage Given to Date: 20 Gy
Reference Point Session Dosage Given: 2 Gy
Session Number: 10

## 2021-08-22 MED FILL — Dexamethasone Sodium Phosphate Inj 100 MG/10ML: INTRAMUSCULAR | Qty: 1 | Status: AC

## 2021-08-25 ENCOUNTER — Ambulatory Visit
Admission: RE | Admit: 2021-08-25 | Discharge: 2021-08-25 | Disposition: A | Discharge status: Home / Self Care | Payer: Medicare HMO | Source: Ambulatory Visit | Attending: Radiation Oncology | Admitting: Radiation Oncology

## 2021-08-25 ENCOUNTER — Other Ambulatory Visit: Payer: Self-pay | Admitting: Medical Oncology

## 2021-08-25 ENCOUNTER — Other Ambulatory Visit: Payer: Self-pay

## 2021-08-25 ENCOUNTER — Inpatient Hospital Stay: Payer: Medicare HMO

## 2021-08-25 ENCOUNTER — Telehealth: Payer: Self-pay | Admitting: Medical Oncology

## 2021-08-25 DIAGNOSIS — C3411 Malignant neoplasm of upper lobe, right bronchus or lung: Secondary | ICD-10-CM | POA: Diagnosis not present

## 2021-08-25 DIAGNOSIS — Z510 Encounter for antineoplastic radiation therapy: Secondary | ICD-10-CM | POA: Diagnosis not present

## 2021-08-25 DIAGNOSIS — D702 Other drug-induced agranulocytosis: Secondary | ICD-10-CM

## 2021-08-25 DIAGNOSIS — C3491 Malignant neoplasm of unspecified part of right bronchus or lung: Secondary | ICD-10-CM

## 2021-08-25 DIAGNOSIS — R0609 Other forms of dyspnea: Secondary | ICD-10-CM | POA: Diagnosis not present

## 2021-08-25 DIAGNOSIS — Z5189 Encounter for other specified aftercare: Secondary | ICD-10-CM | POA: Diagnosis not present

## 2021-08-25 DIAGNOSIS — R059 Cough, unspecified: Secondary | ICD-10-CM | POA: Diagnosis not present

## 2021-08-25 DIAGNOSIS — D701 Agranulocytosis secondary to cancer chemotherapy: Secondary | ICD-10-CM | POA: Diagnosis not present

## 2021-08-25 DIAGNOSIS — Z5111 Encounter for antineoplastic chemotherapy: Secondary | ICD-10-CM | POA: Diagnosis not present

## 2021-08-25 DIAGNOSIS — R5383 Other fatigue: Secondary | ICD-10-CM | POA: Diagnosis not present

## 2021-08-25 DIAGNOSIS — C778 Secondary and unspecified malignant neoplasm of lymph nodes of multiple regions: Secondary | ICD-10-CM | POA: Diagnosis not present

## 2021-08-25 DIAGNOSIS — R918 Other nonspecific abnormal finding of lung field: Secondary | ICD-10-CM | POA: Diagnosis not present

## 2021-08-25 DIAGNOSIS — Z87891 Personal history of nicotine dependence: Secondary | ICD-10-CM | POA: Diagnosis not present

## 2021-08-25 LAB — CMP (CANCER CENTER ONLY)
ALT: 9 U/L (ref 0–44)
AST: 10 U/L — ABNORMAL LOW (ref 15–41)
Albumin: 3.8 g/dL (ref 3.5–5.0)
Alkaline Phosphatase: 65 U/L (ref 38–126)
Anion gap: 4 — ABNORMAL LOW (ref 5–15)
BUN: 17 mg/dL (ref 8–23)
CO2: 27 mmol/L (ref 22–32)
Calcium: 8.7 mg/dL — ABNORMAL LOW (ref 8.9–10.3)
Chloride: 106 mmol/L (ref 98–111)
Creatinine: 1.15 mg/dL (ref 0.61–1.24)
GFR, Estimated: >60 mL/min (ref >60)
Glucose, Bld: 98 mg/dL (ref 70–99)
Potassium: 4.2 mmol/L (ref 3.5–5.1)
Sodium: 137 mmol/L (ref 135–145)
Total Bilirubin: 0.9 mg/dL (ref 0.3–1.2)
Total Protein: 6.4 g/dL — ABNORMAL LOW (ref 6.5–8.1)

## 2021-08-25 LAB — CBC WITH DIFFERENTIAL (CANCER CENTER ONLY)
Abs Immature Granulocytes: 0.01 10*3/uL (ref 0.00–0.07)
Basophils Absolute: 0 10*3/uL (ref 0.0–0.1)
Basophils Relative: 1 %
Eosinophils Absolute: 0.1 10*3/uL (ref 0.0–0.5)
Eosinophils Relative: 8 %
HCT: 33.5 % — ABNORMAL LOW (ref 39.0–52.0)
Hemoglobin: 11.1 g/dL — ABNORMAL LOW (ref 13.0–17.0)
Immature Granulocytes: 1 %
Lymphocytes Relative: 48 %
Lymphs Abs: 0.5 10*3/uL — ABNORMAL LOW (ref 0.7–4.0)
MCH: 29.6 pg (ref 26.0–34.0)
MCHC: 33.1 g/dL (ref 30.0–36.0)
MCV: 89.3 fL (ref 80.0–100.0)
Monocytes Absolute: 0.1 10*3/uL (ref 0.1–1.0)
Monocytes Relative: 5 %
Neutro Abs: 0.4 10*3/uL — CL (ref 1.7–7.7)
Neutrophils Relative %: 37 %
Platelet Count: 165 10*3/uL (ref 150–400)
RBC: 3.75 MIL/uL — ABNORMAL LOW (ref 4.22–5.81)
RDW: 13.2 % (ref 11.5–15.5)
WBC Count: 1 10*3/uL — ABNORMAL LOW (ref 4.0–10.5)
nRBC: 0 % (ref 0.0–0.2)

## 2021-08-25 LAB — RAD ONC ARIA SESSION SUMMARY
Course Elapsed Days: 14
Plan Fractions Treated to Date: 11
Plan Prescribed Dose Per Fraction: 2 Gy
Plan Total Fractions Prescribed: 30
Plan Total Prescribed Dose: 60 Gy
Reference Point Dosage Given to Date: 22 Gy
Reference Point Session Dosage Given: 2 Gy
Session Number: 11

## 2021-08-25 MED ORDER — FILGRASTIM-SNDZ 480 MCG/0.8ML IJ SOSY
480.0000 ug | PREFILLED_SYRINGE | Freq: Once | INTRAMUSCULAR | Status: AC
  Administered 2021-08-25: 480 ug via SUBCUTANEOUS
  Filled 2021-08-25: qty 0.8

## 2021-08-25 NOTE — Telephone Encounter (Signed)
Schedule message sent for injection x 3 days starting today .  ?

## 2021-08-25 NOTE — Telephone Encounter (Signed)
CRITICAL VALUE STICKER ? ?CRITICAL VALUE:ANC = 0.4 ? ?RECEIVER (on-site recipient of call):Roshawnda Pecora ? ?DATE & TIME NOTIFIED: 05/15/230@1137  ? ?MESSENGER (representative from lab): ?Tomi Bamberger ?MD NOTIFIED: Julien Nordmann ? ?TIME OF NOTIFICATION: ?2111 ?RESPONSE: Cancel treatment today ,m GCSF x 3 days. ?

## 2021-08-25 NOTE — Patient Instructions (Addendum)
Filgrastim, G-CSF injection ?What is this medication? ?FILGRASTIM, G-CSF (fil GRA stim) is a granulocyte colony-stimulating factor that stimulates the growth of neutrophils, a type of white blood cell (WBC) important in the body's fight against infection. It is used to reduce the incidence of fever and infection in patients with certain types of cancer who are receiving chemotherapy that affects the bone marrow, to stimulate blood cell production for removal of WBCs from the body prior to a bone marrow transplantation, to reduce the incidence of fever and infection in patients who have severe chronic neutropenia, and to improve survival outcomes following high-dose radiation exposure that is toxic to the bone marrow. ?This medicine may be used for other purposes; ask your health care provider or pharmacist if you have questions. ?COMMON BRAND NAME(S): Neupogen, Nivestym, Releuko, Zarxio ?What should I tell my care team before I take this medication? ?They need to know if you have any of these conditions: ?kidney disease ?latex allergy ?ongoing radiation therapy ?sickle cell disease ?an unusual or allergic reaction to filgrastim, pegfilgrastim, other medicines, foods, dyes, or preservatives ?pregnant or trying to get pregnant ?breast-feeding ?How should I use this medication? ?This medicine is for injection under the skin or infusion into a vein. As an infusion into a vein, it is usually given by a health care professional in a hospital or clinic setting. If you get this medicine at home, you will be taught how to prepare and give this medicine. Refer to the Instructions for Use that come with your medication packaging. Use exactly as directed. Take your medicine at regular intervals. Do not take your medicine more often than directed. ?It is important that you put your used needles and syringes in a special sharps container. Do not put them in a trash can. If you do not have a sharps container, call your pharmacist  or healthcare provider to get one. ?Talk to your pediatrician regarding the use of this medicine in children. While this drug may be prescribed for children as young as 7 months for selected conditions, precautions do apply. ?Overdosage: If you think you have taken too much of this medicine contact a poison control center or emergency room at once. ?NOTE: This medicine is only for you. Do not share this medicine with others. ?What if I miss a dose? ?It is important not to miss your dose. Call your doctor or health care professional if you miss a dose. ?What may interact with this medication? ?This medicine may interact with the following medications: ?medicines that may cause a release of neutrophils, such as lithium ?This list may not describe all possible interactions. Give your health care provider a list of all the medicines, herbs, non-prescription drugs, or dietary supplements you use. Also tell them if you smoke, drink alcohol, or use illegal drugs. Some items may interact with your medicine. ?What should I watch for while using this medication? ?Your condition will be monitored carefully while you are receiving this medicine. ?You may need blood work done while you are taking this medicine. ?Talk to your health care provider about your risk of cancer. You may be more at risk for certain types of cancer if you take this medicine. ?What side effects may I notice from receiving this medication? ?Side effects that you should report to your doctor or health care professional as soon as possible: ?allergic reactions like skin rash, itching or hives, swelling of the face, lips, or tongue ?back pain ?dizziness or feeling faint ?fever ?pain, redness, or  irritation at site where injected ?pinpoint red spots on the skin ?shortness of breath or breathing problems ?signs and symptoms of kidney injury like trouble passing urine, change in the amount of urine, or red or dark-brown urine ?stomach or side pain, or pain at  the shoulder ?swelling ?tiredness ?unusual bleeding or bruising ?Side effects that usually do not require medical attention (report to your doctor or health care professional if they continue or are bothersome): ?bone pain ?cough ?diarrhea ?hair loss ?headache ?muscle pain ?This list may not describe all possible side effects. Call your doctor for medical advice about side effects. You may report side effects to FDA at 1-800-FDA-1088. ?Where should I keep my medication? ?Keep out of the reach of children. ?Store in a refrigerator between 2 and 8 degrees C (36 and 46 degrees F). Do not freeze. Keep in carton to protect from light. Throw away this medicine if vials or syringes are left out of the refrigerator for more than 24 hours. Throw away any unused medicine after the expiration date. ?NOTE: This sheet is a summary. It may not cover all possible information. If you have questions about this medicine, talk to your doctor, pharmacist, or health care provider. ?? 2023 Elsevier/Gold Standard (2021-02-28 00:00:00) ?Neutropenia ?Neutropenia is a condition that occurs when you have low levels of neutrophils. Neutrophils are a type of white blood cells. They are made in the spongy center of bones (bone marrow). They fight infections. ?Neutrophils are your body's main defense against infections. The fewer neutrophils you have and the longer your body remains without them, the greater your risk of getting a severe infection. ?What are the causes? ?This condition can occur if your body uses up or destroys neutrophils faster than your bone marrow can make them. Neutropenia may be caused by: ?A bacterial or fungal infection. ?Allergic disorders. ?Reactions to some medicines. ?An autoimmune disease. ?An enlarged spleen. ?This condition can also occur if your bone marrow does not produce enough neutrophils. This problem may be caused by: ?Cancer. ?Cancer treatments, such as radiation or chemotherapy. ?Viral  infections. ?Medicines, such as phenytoin. ?Vitamin B12 deficiency. ?Diseases of the bone marrow. ?Environmental toxins, such as insecticides. ?What are the signs or symptoms? ?This condition does not usually cause symptoms. If symptoms are present, they are usually caused by an underlying infection. Symptoms of an infection may include: ?Fever. ?Chills. ?Swollen glands. ?Mouth ulcers. ?Cough. ?Rash or skin infection. Skin may be red, swollen, or painful. ?Abdominal or rectal pain. ?Frequent urination or pain or burning with urination. ?Because neutropenia weakens the immune system, symptoms of infection may be reduced. It is important to be aware of any changes in your body and talk to your health care provider. ?How is this diagnosed? ?This condition is diagnosed based on your medical history and a physical exam. Tests will also be done, such as: ?A complete blood count (CBC). ?Bone marrow biopsy. This is collecting a sample of bone marrow for testing. ?A chest X-ray. ?A urine culture. ?A blood culture. ?How is this treated? ?Treatment depends on the underlying cause and severity of your condition. Mild neutropenia may not require treatment. Treatment may include medicines, such as: ?Antibiotic medicine given through an IV. ?Antiviral medicines. ?Antifungal medicines. ?A medicine to increase production of neutrophils (colony-stimulating factor). You may get this medicine through an IV or by injection. ?Steroids given through an IV. ?If an underlying condition is causing neutropenia, you may need treatment for that condition. If medicines or cancer treatments are causing  neutropenia, your health care provider may have you stop the medicines or treatment. ?Follow these instructions at home: ?Medicines ? ?Take over-the-counter and prescription medicines only as told by your health care provider. ?Get an annual flu shot. Ask your health care provider whether you or anyone you live with needs any other  vaccines. ?Eating and drinking ?Do not share food utensils. ?Do not eat unpasteurized foods. ?Do not eat raw or undercooked meat, eggs, or seafood. ?Do not eat unwashed, raw fruits or vegetables. ?Lifestyle ?Avoid exposure to groups of pe

## 2021-08-26 ENCOUNTER — Other Ambulatory Visit: Payer: Self-pay

## 2021-08-26 ENCOUNTER — Inpatient Hospital Stay: Payer: Medicare HMO

## 2021-08-26 ENCOUNTER — Ambulatory Visit
Admission: RE | Admit: 2021-08-26 | Discharge: 2021-08-26 | Disposition: A | Discharge status: Home / Self Care | Payer: Medicare HMO | Source: Ambulatory Visit | Attending: Radiation Oncology | Admitting: Radiation Oncology

## 2021-08-26 ENCOUNTER — Telehealth: Payer: Self-pay | Admitting: Medical Oncology

## 2021-08-26 DIAGNOSIS — D702 Other drug-induced agranulocytosis: Secondary | ICD-10-CM

## 2021-08-26 DIAGNOSIS — Z5111 Encounter for antineoplastic chemotherapy: Secondary | ICD-10-CM | POA: Diagnosis not present

## 2021-08-26 DIAGNOSIS — C3411 Malignant neoplasm of upper lobe, right bronchus or lung: Secondary | ICD-10-CM | POA: Diagnosis not present

## 2021-08-26 DIAGNOSIS — R918 Other nonspecific abnormal finding of lung field: Secondary | ICD-10-CM | POA: Diagnosis not present

## 2021-08-26 DIAGNOSIS — Z5189 Encounter for other specified aftercare: Secondary | ICD-10-CM | POA: Diagnosis not present

## 2021-08-26 DIAGNOSIS — R059 Cough, unspecified: Secondary | ICD-10-CM | POA: Diagnosis not present

## 2021-08-26 DIAGNOSIS — C778 Secondary and unspecified malignant neoplasm of lymph nodes of multiple regions: Secondary | ICD-10-CM | POA: Diagnosis not present

## 2021-08-26 DIAGNOSIS — Z87891 Personal history of nicotine dependence: Secondary | ICD-10-CM | POA: Diagnosis not present

## 2021-08-26 DIAGNOSIS — Z510 Encounter for antineoplastic radiation therapy: Secondary | ICD-10-CM | POA: Diagnosis not present

## 2021-08-26 DIAGNOSIS — R0609 Other forms of dyspnea: Secondary | ICD-10-CM | POA: Diagnosis not present

## 2021-08-26 DIAGNOSIS — D701 Agranulocytosis secondary to cancer chemotherapy: Secondary | ICD-10-CM | POA: Diagnosis not present

## 2021-08-26 DIAGNOSIS — R5383 Other fatigue: Secondary | ICD-10-CM | POA: Diagnosis not present

## 2021-08-26 LAB — RAD ONC ARIA SESSION SUMMARY
Course Elapsed Days: 15
Plan Fractions Treated to Date: 12
Plan Prescribed Dose Per Fraction: 2 Gy
Plan Total Fractions Prescribed: 30
Plan Total Prescribed Dose: 60 Gy
Reference Point Dosage Given to Date: 24 Gy
Reference Point Session Dosage Given: 2 Gy
Session Number: 12

## 2021-08-26 MED ORDER — FILGRASTIM-SNDZ 480 MCG/0.8ML IJ SOSY
480.0000 ug | PREFILLED_SYRINGE | Freq: Once | INTRAMUSCULAR | Status: AC
  Administered 2021-08-26: 480 ug via SUBCUTANEOUS
  Filled 2021-08-26: qty 0.8

## 2021-08-26 NOTE — Telephone Encounter (Signed)
Family reunion this weekend ?Wife asking if pt can attend an outdoor reunion. I told her that he can attend and he needs to use good hand hygiene, avoid fresh food and wear a mask around people. ?

## 2021-08-27 ENCOUNTER — Other Ambulatory Visit: Payer: Self-pay

## 2021-08-27 ENCOUNTER — Ambulatory Visit
Admission: RE | Admit: 2021-08-27 | Discharge: 2021-08-27 | Disposition: A | Discharge status: Home / Self Care | Payer: Medicare HMO | Source: Ambulatory Visit | Attending: Radiation Oncology | Admitting: Radiation Oncology

## 2021-08-27 ENCOUNTER — Inpatient Hospital Stay: Payer: Medicare HMO

## 2021-08-27 VITALS — BP 120/58 | HR 71 | Temp 97.8°F | Resp 18

## 2021-08-27 DIAGNOSIS — D702 Other drug-induced agranulocytosis: Secondary | ICD-10-CM

## 2021-08-27 DIAGNOSIS — C3411 Malignant neoplasm of upper lobe, right bronchus or lung: Secondary | ICD-10-CM | POA: Diagnosis not present

## 2021-08-27 DIAGNOSIS — R059 Cough, unspecified: Secondary | ICD-10-CM | POA: Diagnosis not present

## 2021-08-27 DIAGNOSIS — Z5111 Encounter for antineoplastic chemotherapy: Secondary | ICD-10-CM | POA: Diagnosis not present

## 2021-08-27 DIAGNOSIS — R5383 Other fatigue: Secondary | ICD-10-CM | POA: Diagnosis not present

## 2021-08-27 DIAGNOSIS — Z5189 Encounter for other specified aftercare: Secondary | ICD-10-CM | POA: Diagnosis not present

## 2021-08-27 DIAGNOSIS — D701 Agranulocytosis secondary to cancer chemotherapy: Secondary | ICD-10-CM | POA: Diagnosis not present

## 2021-08-27 DIAGNOSIS — Z87891 Personal history of nicotine dependence: Secondary | ICD-10-CM | POA: Diagnosis not present

## 2021-08-27 DIAGNOSIS — R0609 Other forms of dyspnea: Secondary | ICD-10-CM | POA: Diagnosis not present

## 2021-08-27 DIAGNOSIS — R918 Other nonspecific abnormal finding of lung field: Secondary | ICD-10-CM | POA: Diagnosis not present

## 2021-08-27 DIAGNOSIS — C778 Secondary and unspecified malignant neoplasm of lymph nodes of multiple regions: Secondary | ICD-10-CM | POA: Diagnosis not present

## 2021-08-27 DIAGNOSIS — Z510 Encounter for antineoplastic radiation therapy: Secondary | ICD-10-CM | POA: Diagnosis not present

## 2021-08-27 LAB — RAD ONC ARIA SESSION SUMMARY
Course Elapsed Days: 16
Plan Fractions Treated to Date: 13
Plan Prescribed Dose Per Fraction: 2 Gy
Plan Total Fractions Prescribed: 30
Plan Total Prescribed Dose: 60 Gy
Reference Point Dosage Given to Date: 26 Gy
Reference Point Session Dosage Given: 2 Gy
Session Number: 13

## 2021-08-27 MED ORDER — FILGRASTIM-SNDZ 480 MCG/0.8ML IJ SOSY
480.0000 ug | PREFILLED_SYRINGE | Freq: Once | INTRAMUSCULAR | Status: AC
  Administered 2021-08-27: 480 ug via SUBCUTANEOUS
  Filled 2021-08-27: qty 0.8

## 2021-08-28 ENCOUNTER — Other Ambulatory Visit: Payer: Self-pay

## 2021-08-28 ENCOUNTER — Ambulatory Visit
Admission: RE | Admit: 2021-08-28 | Discharge: 2021-08-28 | Disposition: A | Discharge status: Home / Self Care | Payer: Medicare HMO | Source: Ambulatory Visit | Attending: Radiation Oncology | Admitting: Radiation Oncology

## 2021-08-28 DIAGNOSIS — Z87891 Personal history of nicotine dependence: Secondary | ICD-10-CM | POA: Diagnosis not present

## 2021-08-28 DIAGNOSIS — R918 Other nonspecific abnormal finding of lung field: Secondary | ICD-10-CM | POA: Diagnosis not present

## 2021-08-28 DIAGNOSIS — C3411 Malignant neoplasm of upper lobe, right bronchus or lung: Secondary | ICD-10-CM | POA: Diagnosis not present

## 2021-08-28 DIAGNOSIS — Z510 Encounter for antineoplastic radiation therapy: Secondary | ICD-10-CM | POA: Diagnosis not present

## 2021-08-28 LAB — RAD ONC ARIA SESSION SUMMARY
Course Elapsed Days: 17
Plan Fractions Treated to Date: 14
Plan Prescribed Dose Per Fraction: 2 Gy
Plan Total Fractions Prescribed: 30
Plan Total Prescribed Dose: 60 Gy
Reference Point Dosage Given to Date: 28 Gy
Reference Point Session Dosage Given: 2 Gy
Session Number: 14

## 2021-08-29 ENCOUNTER — Telehealth: Payer: Self-pay | Admitting: Internal Medicine

## 2021-08-29 ENCOUNTER — Other Ambulatory Visit: Payer: Self-pay

## 2021-08-29 ENCOUNTER — Encounter (HOSPITAL_COMMUNITY): Payer: Self-pay

## 2021-08-29 ENCOUNTER — Ambulatory Visit
Admission: RE | Admit: 2021-08-29 | Discharge: 2021-08-29 | Disposition: A | Discharge status: Home / Self Care | Payer: Medicare HMO | Source: Ambulatory Visit | Attending: Radiation Oncology | Admitting: Radiation Oncology

## 2021-08-29 DIAGNOSIS — R918 Other nonspecific abnormal finding of lung field: Secondary | ICD-10-CM | POA: Diagnosis not present

## 2021-08-29 DIAGNOSIS — Z87891 Personal history of nicotine dependence: Secondary | ICD-10-CM | POA: Diagnosis not present

## 2021-08-29 DIAGNOSIS — C3411 Malignant neoplasm of upper lobe, right bronchus or lung: Secondary | ICD-10-CM | POA: Diagnosis not present

## 2021-08-29 DIAGNOSIS — Z510 Encounter for antineoplastic radiation therapy: Secondary | ICD-10-CM | POA: Diagnosis not present

## 2021-08-29 LAB — RAD ONC ARIA SESSION SUMMARY
Course Elapsed Days: 18
Plan Fractions Treated to Date: 15
Plan Prescribed Dose Per Fraction: 2 Gy
Plan Total Fractions Prescribed: 30
Plan Total Prescribed Dose: 60 Gy
Reference Point Dosage Given to Date: 30 Gy
Reference Point Session Dosage Given: 2 Gy
Session Number: 15

## 2021-08-29 MED FILL — Dexamethasone Sodium Phosphate Inj 100 MG/10ML: INTRAMUSCULAR | Qty: 1 | Status: AC

## 2021-08-29 NOTE — Telephone Encounter (Signed)
Called patient regarding upcoming appointment, patient has notified.

## 2021-09-01 ENCOUNTER — Inpatient Hospital Stay: Payer: Medicare HMO

## 2021-09-01 ENCOUNTER — Other Ambulatory Visit: Payer: Self-pay

## 2021-09-01 ENCOUNTER — Ambulatory Visit
Admission: RE | Admit: 2021-09-01 | Discharge: 2021-09-01 | Disposition: A | Discharge status: Home / Self Care | Payer: Medicare HMO | Source: Ambulatory Visit | Attending: Radiation Oncology | Admitting: Radiation Oncology

## 2021-09-01 ENCOUNTER — Inpatient Hospital Stay (HOSPITAL_BASED_OUTPATIENT_CLINIC_OR_DEPARTMENT_OTHER): Payer: Medicare HMO | Admitting: Internal Medicine

## 2021-09-01 VITALS — BP 119/66 | HR 83 | Temp 97.9°F | Resp 16 | Wt 177.0 lb

## 2021-09-01 DIAGNOSIS — R5383 Other fatigue: Secondary | ICD-10-CM | POA: Diagnosis not present

## 2021-09-01 DIAGNOSIS — R0609 Other forms of dyspnea: Secondary | ICD-10-CM | POA: Diagnosis not present

## 2021-09-01 DIAGNOSIS — Z87891 Personal history of nicotine dependence: Secondary | ICD-10-CM | POA: Diagnosis not present

## 2021-09-01 DIAGNOSIS — C3491 Malignant neoplasm of unspecified part of right bronchus or lung: Secondary | ICD-10-CM

## 2021-09-01 DIAGNOSIS — D701 Agranulocytosis secondary to cancer chemotherapy: Secondary | ICD-10-CM | POA: Diagnosis not present

## 2021-09-01 DIAGNOSIS — Z5189 Encounter for other specified aftercare: Secondary | ICD-10-CM | POA: Diagnosis not present

## 2021-09-01 DIAGNOSIS — C778 Secondary and unspecified malignant neoplasm of lymph nodes of multiple regions: Secondary | ICD-10-CM | POA: Diagnosis not present

## 2021-09-01 DIAGNOSIS — Z5111 Encounter for antineoplastic chemotherapy: Secondary | ICD-10-CM | POA: Diagnosis not present

## 2021-09-01 DIAGNOSIS — R059 Cough, unspecified: Secondary | ICD-10-CM | POA: Diagnosis not present

## 2021-09-01 DIAGNOSIS — C3411 Malignant neoplasm of upper lobe, right bronchus or lung: Secondary | ICD-10-CM | POA: Diagnosis not present

## 2021-09-01 DIAGNOSIS — Z510 Encounter for antineoplastic radiation therapy: Secondary | ICD-10-CM | POA: Diagnosis not present

## 2021-09-01 DIAGNOSIS — R918 Other nonspecific abnormal finding of lung field: Secondary | ICD-10-CM | POA: Diagnosis not present

## 2021-09-01 LAB — CBC WITH DIFFERENTIAL (CANCER CENTER ONLY)
Abs Immature Granulocytes: 0.33 10*3/uL — ABNORMAL HIGH (ref 0.00–0.07)
Basophils Absolute: 0.1 10*3/uL (ref 0.0–0.1)
Basophils Relative: 1 %
Eosinophils Absolute: 0 10*3/uL (ref 0.0–0.5)
Eosinophils Relative: 1 %
HCT: 35.4 % — ABNORMAL LOW (ref 39.0–52.0)
Hemoglobin: 11.8 g/dL — ABNORMAL LOW (ref 13.0–17.0)
Immature Granulocytes: 4 %
Lymphocytes Relative: 7 %
Lymphs Abs: 0.6 10*3/uL — ABNORMAL LOW (ref 0.7–4.0)
MCH: 29.7 pg (ref 26.0–34.0)
MCHC: 33.3 g/dL (ref 30.0–36.0)
MCV: 89.2 fL (ref 80.0–100.0)
Monocytes Absolute: 1.1 10*3/uL — ABNORMAL HIGH (ref 0.1–1.0)
Monocytes Relative: 13 %
Neutro Abs: 6.5 10*3/uL (ref 1.7–7.7)
Neutrophils Relative %: 74 %
Platelet Count: 133 10*3/uL — ABNORMAL LOW (ref 150–400)
RBC: 3.97 MIL/uL — ABNORMAL LOW (ref 4.22–5.81)
RDW: 14.3 % (ref 11.5–15.5)
WBC Count: 8.7 10*3/uL (ref 4.0–10.5)
nRBC: 0 % (ref 0.0–0.2)

## 2021-09-01 LAB — CMP (CANCER CENTER ONLY)
ALT: 28 U/L (ref 0–44)
AST: 23 U/L (ref 15–41)
Albumin: 3.9 g/dL (ref 3.5–5.0)
Alkaline Phosphatase: 106 U/L (ref 38–126)
Anion gap: 7 (ref 5–15)
BUN: 12 mg/dL (ref 8–23)
CO2: 28 mmol/L (ref 22–32)
Calcium: 9.4 mg/dL (ref 8.9–10.3)
Chloride: 102 mmol/L (ref 98–111)
Creatinine: 1.16 mg/dL (ref 0.61–1.24)
GFR, Estimated: >60 mL/min (ref >60)
Glucose, Bld: 102 mg/dL — ABNORMAL HIGH (ref 70–99)
Potassium: 3.8 mmol/L (ref 3.5–5.1)
Sodium: 137 mmol/L (ref 135–145)
Total Bilirubin: 1.1 mg/dL (ref 0.3–1.2)
Total Protein: 7.3 g/dL (ref 6.5–8.1)

## 2021-09-01 LAB — RAD ONC ARIA SESSION SUMMARY
Course Elapsed Days: 21
Plan Fractions Treated to Date: 16
Plan Prescribed Dose Per Fraction: 2 Gy
Plan Total Fractions Prescribed: 30
Plan Total Prescribed Dose: 60 Gy
Reference Point Dosage Given to Date: 32 Gy
Reference Point Session Dosage Given: 2 Gy
Session Number: 16

## 2021-09-01 MED ORDER — SODIUM CHLORIDE 0.9 % IV SOLN
130.0000 mg | Freq: Once | INTRAVENOUS | Status: AC
  Administered 2021-09-01: 130 mg via INTRAVENOUS
  Filled 2021-09-01: qty 13

## 2021-09-01 MED ORDER — FAMOTIDINE IN NACL 20-0.9 MG/50ML-% IV SOLN
20.0000 mg | Freq: Once | INTRAVENOUS | Status: AC
  Administered 2021-09-01: 20 mg via INTRAVENOUS
  Filled 2021-09-01: qty 50

## 2021-09-01 MED ORDER — PALONOSETRON HCL INJECTION 0.25 MG/5ML
0.2500 mg | Freq: Once | INTRAVENOUS | Status: AC
  Administered 2021-09-01: 0.25 mg via INTRAVENOUS
  Filled 2021-09-01: qty 5

## 2021-09-01 MED ORDER — SODIUM CHLORIDE 0.9 % IV SOLN
10.0000 mg | Freq: Once | INTRAVENOUS | Status: DC
  Administered 2021-09-01: 10 mg via INTRAVENOUS
  Filled 2021-09-01: qty 1

## 2021-09-01 MED ORDER — DIPHENHYDRAMINE HCL 50 MG/ML IJ SOLN
50.0000 mg | Freq: Once | INTRAMUSCULAR | Status: AC
  Administered 2021-09-01: 50 mg via INTRAVENOUS
  Filled 2021-09-01: qty 1

## 2021-09-01 MED ORDER — SODIUM CHLORIDE 0.9 % IV SOLN: Freq: Once | INTRAVENOUS | Status: AC

## 2021-09-01 MED ORDER — SODIUM CHLORIDE 0.9 % IV SOLN
10.0000 mg | Freq: Once | INTRAVENOUS | Status: AC
  Administered 2021-09-01: 10 mg via INTRAVENOUS
  Filled 2021-09-01: qty 1

## 2021-09-01 MED ORDER — SODIUM CHLORIDE 0.9 % IV SOLN
45.0000 mg/m2 | Freq: Once | INTRAVENOUS | Status: AC
  Administered 2021-09-01: 90 mg via INTRAVENOUS
  Filled 2021-09-01: qty 15

## 2021-09-01 NOTE — Progress Notes (Signed)
Continue Carboplatin 130mg  (AUC 1.5).  Raul Del Kenton, [place], BCPS, BCOP 09/01/2021 2:25 PM

## 2021-09-01 NOTE — Progress Notes (Signed)
[facility] Telephone:([phone]   Fax:(336) [phone]  OFFICE PROGRESS NOTE  [name], MD [place] [address]  DIAGNOSIS: Stage IIIC (T3, N3, M0) non-small cell lung cancer, adenocarcinoma presented with right upper lobe lung mass status post right upper lobe wedge resection but no lymph node dissection.  The tumor measured 3.3 cm with visceral pleural involvement and very close resection margin less than 0.1 cm from the tumor.  This was performed on May 13, 2021 in the same setting of CABG under the care of Dr. Kipp Brood.  PET scan performed in March 2023 showed a right upper lobe nodule adjacent to the wedge resection site small hypermetabolic right hilar, right subcarinal, and left infrahilar lymph nodes which could be reactive but are suspicious for nodal involvement.   Molecular studies showed no actionable mutations and PD-L1 expression was 0%   PRIOR THERAPY: Right upper lobe wedge resection under the care of Dr. Kipp Brood on 05/13/2021   CURRENT THERAPY: Concurrent chemoradiation with weekly carboplatin for AUC of 2 and paclitaxel 45 Mg/M2.  First dose Aug 11, 2021.  Status post 2 cycles  INTERVAL HISTORY: Kevin [name] 81 y.o. male returns to the clinic today for follow-up visit accompanied by his wife.  The patient is feeling fine today with no concerning complaints except for mild cough and shortness of breath with exertion.  He denied having any chest pain or hemoptysis.  He has no nausea, vomiting, diarrhea or constipation.  He has no headache or visual changes.  He continues to tolerate his course of concurrent chemoradiation fairly well.  He is here today for evaluation before starting cycle #3.    MEDICAL HISTORY: Past Medical History:  Diagnosis Date   Arthritis    Coronary artery disease    Hyperlipidemia     ALLERGIES:  has No Known Allergies.  MEDICATIONS:  Current Outpatient Medications  Medication Sig Dispense Refill    albuterol (VENTOLIN HFA) 108 (90 Base) MCG/ACT inhaler Inhale 1-2 puffs into the lungs every 6 (six) hours as needed for wheezing or shortness of breath. (Patient not taking: Reported on 08/18/2021) 18 g 3   aspirin EC 81 MG tablet Take 81 mg by mouth daily.     atorvastatin (LIPITOR) 80 MG tablet Take 1 tablet (80 mg total) by mouth daily. 90 tablet 3   Calcium Carb-Cholecalciferol (CALCIUM 600 + D PO) Take 1,200 mg by mouth daily.     Cholecalciferol (DIALYVITE VITAMIN D 5000) 125 MCG (5000 UT) capsule Take 5,000 Units by mouth daily.     clopidogrel (PLAVIX) 75 MG tablet Take 1 tablet (75 mg total) by mouth daily. Okay to restart this medication on 07/23/2021 90 tablet 2   Fluticasone-Umeclidin-Vilant (TRELEGY ELLIPTA) 100-62.5-25 MCG/ACT AEPB Inhale 1 puff into the lungs daily. 60 each 0   metoprolol tartrate (LOPRESSOR) 25 MG tablet Take 1 tablet (25 mg total) by mouth 2 (two) times daily. 180 tablet 3   nitroGLYCERIN (NITROSTAT) 0.4 MG SL tablet Place 0.4 mg under the tongue every 5 (five) minutes as needed for chest pain.     prochlorperazine (COMPAZINE) 10 MG tablet Take 1 tablet (10 mg total) by mouth every 6 (six) hours as needed for nausea or vomiting. 30 tablet 0   traZODone (DESYREL) 50 MG tablet Take 25-50 mg by mouth at bedtime as needed for sleep.     No current facility-administered medications for this visit.    SURGICAL HISTORY:  Past  Surgical History:  Procedure Laterality Date   BRONCHIAL BIOPSY  07/22/2021   Procedure: BRONCHIAL BIOPSIES;  Surgeon: Collene Gobble, MD;  Location: [facility];  Service: Cardiopulmonary;;   BRONCHIAL BRUSHINGS  07/22/2021   Procedure: BRONCHIAL BRUSHINGS;  Surgeon: Collene Gobble, MD;  Location: [facility];  Service: Cardiopulmonary;;   BRONCHIAL NEEDLE ASPIRATION BIOPSY  07/22/2021   Procedure: BRONCHIAL NEEDLE ASPIRATION BIOPSIES;  Surgeon: Collene Gobble, MD;  Location: [facility];  Service: Cardiopulmonary;;   CORONARY ARTERY BYPASS  GRAFT N/A 05/13/2021   Procedure: CORONARY ARTERY BYPASS GRAFTING (CABG) TIMES 3  , ON PUMP, USING LEFT INTERNAL MAMMARY ARTERY AND RIGHT GREATER SAPHENOUS VEIN HARVESTED ENDOSCOPICALLY;  Surgeon: Lajuana Matte, MD;  Location: [facility];  Service: Open Heart Surgery;  Laterality: N/A;   ENDOVEIN HARVEST OF GREATER SAPHENOUS VEIN Right 05/13/2021   Procedure: ENDOVEIN HARVEST OF GREATER SAPHENOUS VEIN;  Surgeon: Lajuana Matte, MD;  Location: [facility];  Service: Open Heart Surgery;  Laterality: Right;   epidural     back pain   IR CT SPINE LTD  06/18/2020   IR KYPHO LUMBAR INC FX REDUCE BONE BX UNI/BIL CANNULATION INC/IMAGING  06/18/2020   LEFT HEART CATH AND CORONARY ANGIOGRAPHY N/A 05/06/2021   Procedure: LEFT HEART CATH AND CORONARY ANGIOGRAPHY;  Surgeon: [name], MD;  Location: [facility];  Service: Cardiovascular;  Laterality: N/A;   LUNG BIOPSY Right 05/13/2021   Procedure: RIGHT LUNG BIOPSY;  Surgeon: Lajuana Matte, MD;  Location: [facility];  Service: Open Heart Surgery;  Laterality: Right;   MINOR HEMORRHOIDECTOMY     SHOULDER SURGERY     TEE WITHOUT CARDIOVERSION N/A 05/13/2021   Procedure: TRANSESOPHAGEAL ECHOCARDIOGRAM (TEE);  Surgeon: Lajuana Matte, MD;  Location: [facility];  Service: Open Heart Surgery;  Laterality: N/A;   TRANSURETHRAL RESECTION OF BLADDER TUMOR  2014   TRANSURETHRAL RESECTION OF BLADDER TUMOR N/A 05/19/2018   Procedure: TRANSURETHRAL RESECTION OF BLADDER TUMOR (TURBT) AND (INTRAVESICAL GEMCITABINE INSTILLED IN BLADDER IN PACU) ;  Surgeon: Franchot Gallo, MD;  Location: [facility];  Service: Urology;  Laterality: N/A;  [facility] Bilateral 07/22/2021   Procedure: VIDEO BRONCHOSCOPY WITH ENDOBRONCHIAL ULTRASOUND;  Surgeon: Collene Gobble, MD;  Location: [facility];  Service: Cardiopulmonary;  Laterality: Bilateral;    REVIEW OF SYSTEMS:  A comprehensive review of systems was negative except for:  Constitutional: positive for fatigue Respiratory: positive for cough and dyspnea on exertion   PHYSICAL EXAMINATION: General appearance: alert, cooperative, fatigued, and no distress Head: Normocephalic, without obvious abnormality, atraumatic Neck: no adenopathy, no JVD, supple, symmetrical, trachea midline, and thyroid not enlarged, symmetric, no tenderness/mass/nodules Lymph nodes: Cervical, supraclavicular, and axillary nodes normal. Resp: clear to auscultation bilaterally Back: symmetric, no curvature. ROM normal. No CVA tenderness. Cardio: regular rate and rhythm, S1, S2 normal, no murmur, click, rub or gallop GI: soft, non-tender; bowel sounds normal; no masses,  no organomegaly Extremities: extremities normal, atraumatic, no cyanosis or edema  ECOG PERFORMANCE STATUS: 1 - Symptomatic but completely ambulatory  Blood pressure 119/66, pulse 83, temperature 97.9 F (36.6 C), temperature source Oral, resp. rate 16, weight 177 lb (80.3 kg), SpO2 97 %.  LABORATORY DATA: Lab Results  Component Value Date   WBC 8.7 09/01/2021   HGB 11.8 (L) 09/01/2021   HCT 35.4 (L) 09/01/2021   MCV 89.2 09/01/2021   PLT 133 (L) 09/01/2021      Chemistry  Component Value Date/Time   NA 137 09/01/2021 1027   NA 139 05/01/2021 1146   K 3.8 09/01/2021 1027   CL 102 09/01/2021 1027   CO2 28 09/01/2021 1027   BUN 12 09/01/2021 1027   BUN 20 05/01/2021 1146   CREATININE 1.16 09/01/2021 1027      Component Value Date/Time   CALCIUM 9.4 09/01/2021 1027   ALKPHOS 106 09/01/2021 1027   AST 23 09/01/2021 1027   ALT 28 09/01/2021 1027   BILITOT 1.1 09/01/2021 1027       RADIOGRAPHIC STUDIES: No results found.  ASSESSMENT AND PLAN: This is a very pleasant 81 years old white male diagnosed with stage IIIC (T3, N3, M0) non-small cell lung cancer, adenocarcinoma with no actionable mutations and PD-L1 expression of 0% diagnosed in January 2023 status post wedge resection of the right upper  lobe lung mass in a setting of CABG under the care of Dr. Kipp Brood but the PET scan showed residual disease in the right upper lobe in addition to hypermetabolic right hilar, subcarinal and left infrahilar lymph nodes. The patient was seen by Dr. Lamonte Sakai and biopsy of the right upper lobe as well as the subcarinal and left hilar lymph nodes were positive for malignancy consistent with non-small cell lung cancer, adenocarcinoma. He is currently undergoing a course of concurrent chemoradiation with weekly carboplatin for AUC of 2 and paclitaxel 45 Mg/M2 status post 2 cycles.  He has been tolerating this treatment well with no concerning adverse effect except for chemotherapy-induced neutropenia that completely resolved at this point. I recommended for the patient to proceed with cycle #3 today as planned. He will come back for follow-up visit in 2 weeks for evaluation before starting cycle #5. The patient was advised to call immediately if he has any other concerning symptoms in the interval. The patient voices understanding of current disease status and treatment options and is in agreement with the current care plan. All questions were answered. The patient knows to call the clinic with any problems, questions or concerns. We can certainly see the patient much sooner if necessary.   Disclaimer: This note was dictated with voice recognition software. Similar sounding words can inadvertently be transcribed and may not be corrected upon review.

## 2021-09-01 NOTE — Progress Notes (Signed)
Hand-off given to [place], Therapist, sports.  Patient has Carboplatin currently infusing. Pt in stable condition and family at bedside

## 2021-09-01 NOTE — Patient Instructions (Signed)
[facility]  Discharge Instructions: Thank you for choosing [facility] to provide your oncology and hematology care.   If you have a lab appointment with the [facility], please go directly to the [facility] and check in at the registration area.   Wear comfortable clothing and clothing appropriate for easy access to any Portacath or PICC line.   We strive to give you quality time with your provider. You may need to reschedule your appointment if you arrive late (15 or more minutes).  Arriving late affects you and other patients whose appointments are after yours.  Also, if you miss three or more appointments without notifying the office, you may be dismissed from the clinic at the provider's discretion.      For prescription refill requests, have your pharmacy contact our office and allow 72 hours for refills to be completed.    Today you received the following chemotherapy and/or immunotherapy agents: Taxol, Carboplatin      To help prevent nausea and vomiting after your treatment, we encourage you to take your nausea medication as directed.  BELOW ARE SYMPTOMS THAT SHOULD BE REPORTED IMMEDIATELY: *FEVER GREATER THAN 100.4 F (38 C) OR HIGHER *CHILLS OR SWEATING *NAUSEA AND VOMITING THAT IS NOT CONTROLLED WITH YOUR NAUSEA MEDICATION *UNUSUAL SHORTNESS OF BREATH *UNUSUAL BRUISING OR BLEEDING *URINARY PROBLEMS (pain or burning when urinating, or frequent urination) *BOWEL PROBLEMS (unusual diarrhea, constipation, pain near the anus) TENDERNESS IN MOUTH AND THROAT WITH OR WITHOUT PRESENCE OF ULCERS (sore throat, sores in mouth, or a toothache) UNUSUAL RASH, SWELLING OR PAIN  UNUSUAL VAGINAL DISCHARGE OR ITCHING   Items with * indicate a potential emergency and should be followed up as soon as possible or go to the Emergency Department if any problems should occur.  Please show the CHEMOTHERAPY ALERT CARD or IMMUNOTHERAPY ALERT CARD at  check-in to the Emergency Department and triage nurse.  Should you have questions after your visit or need to cancel or reschedule your appointment, please contact [facility]  Dept: [phone]  and follow the prompts.  Office hours are 8:00 a.m. to 4:30 p.m. Monday - Friday. Please note that voicemails left after 4:00 p.m. may not be returned until the following business day.  We are closed weekends and major holidays. You have access to a nurse at all times for urgent questions. Please call the main number to the clinic Dept: [phone] and follow the prompts.   For any non-urgent questions, you may also contact your provider using MyChart. We now offer e-Visits for anyone 19 and older to request care online for non-urgent symptoms. For details visit [email or web address].   Also download the MyChart app! Go to the app store, search "MyChart", open the app, select [facility], and log in with your MyChart username and password.  Due to Covid, a mask is required upon entering the hospital/clinic. If you do not have a mask, one will be given to you upon arrival. For doctor visits, patients may have 1 support person aged 50 or older with them. For treatment visits, patients cannot have anyone with them due to current Covid guidelines and our immunocompromised population.

## 2021-09-02 ENCOUNTER — Ambulatory Visit
Admission: RE | Admit: 2021-09-02 | Discharge: 2021-09-02 | Disposition: A | Discharge status: Home / Self Care | Payer: Medicare HMO | Source: Ambulatory Visit | Attending: Radiation Oncology | Admitting: Radiation Oncology

## 2021-09-02 ENCOUNTER — Encounter: Payer: Self-pay | Admitting: Urology

## 2021-09-02 ENCOUNTER — Other Ambulatory Visit: Payer: Self-pay

## 2021-09-02 ENCOUNTER — Ambulatory Visit (INDEPENDENT_AMBULATORY_CARE_PROVIDER_SITE_OTHER): Payer: Medicare HMO | Admitting: Urology

## 2021-09-02 VITALS — BP 127/59 | HR 80

## 2021-09-02 DIAGNOSIS — Z8551 Personal history of malignant neoplasm of bladder: Secondary | ICD-10-CM | POA: Diagnosis not present

## 2021-09-02 DIAGNOSIS — Z87891 Personal history of nicotine dependence: Secondary | ICD-10-CM | POA: Diagnosis not present

## 2021-09-02 DIAGNOSIS — N210 Calculus in bladder: Secondary | ICD-10-CM

## 2021-09-02 DIAGNOSIS — R918 Other nonspecific abnormal finding of lung field: Secondary | ICD-10-CM | POA: Diagnosis not present

## 2021-09-02 DIAGNOSIS — N138 Other obstructive and reflux uropathy: Secondary | ICD-10-CM | POA: Diagnosis not present

## 2021-09-02 DIAGNOSIS — C3411 Malignant neoplasm of upper lobe, right bronchus or lung: Secondary | ICD-10-CM | POA: Diagnosis not present

## 2021-09-02 DIAGNOSIS — Z510 Encounter for antineoplastic radiation therapy: Secondary | ICD-10-CM | POA: Diagnosis not present

## 2021-09-02 DIAGNOSIS — R351 Nocturia: Secondary | ICD-10-CM | POA: Diagnosis not present

## 2021-09-02 DIAGNOSIS — N401 Enlarged prostate with lower urinary tract symptoms: Secondary | ICD-10-CM

## 2021-09-02 LAB — RAD ONC ARIA SESSION SUMMARY
Course Elapsed Days: 22
Plan Fractions Treated to Date: 17
Plan Prescribed Dose Per Fraction: 2 Gy
Plan Total Fractions Prescribed: 30
Plan Total Prescribed Dose: 60 Gy
Reference Point Dosage Given to Date: 34 Gy
Reference Point Session Dosage Given: 2 Gy
Session Number: 17

## 2021-09-02 NOTE — Progress Notes (Signed)
History of Present Illness: Here for bladder cancer surveillance.    4.24.2017: TURBT of a 2 cm low-grade nonmuscle invasive bladder tumor by Dr. Exie Parody.  Repeat resection for recurrence in August 2017.  Mitomycin was placed after each procedure.   2.6.2020: Repeat TURBT/gemcitabine for 15 mm right posterior bladder wall lesion, low-grade nonmuscle invasive on pathology review.   5.19.2019--he underwent UroLift for BPH symptoms.  6 implants were placed.   10.25.2022: Now back on tamsulosin because of nocturia x3-4.  No gross hematuria.  Tamsulosin has improved his nighttime voiding.  He does drink coffee in the evening.  5.23.2023: Here for questions.  He was recently diagnosed with severe CAD, underwent urgent bypass by Dr. Kipp Brood.  Part of the evaluation included preoperative chest CT which revealed probable carcinoma of the right upper lobe.  He had a resection at the same time.  He has stage III adenocarcinoma.  He is getting chemo/radiotherapy now.  ` He is not currently on tamsulosin, voiding fairly well.   Past Medical History:  Diagnosis Date   Arthritis    Coronary artery disease    Hyperlipidemia     Past Surgical History:  Procedure Laterality Date   BRONCHIAL BIOPSY  07/22/2021   Procedure: BRONCHIAL BIOPSIES;  Surgeon: Collene Gobble, MD;  Location: [facility];  Service: Cardiopulmonary;;   BRONCHIAL BRUSHINGS  07/22/2021   Procedure: BRONCHIAL BRUSHINGS;  Surgeon: Collene Gobble, MD;  Location: [facility];  Service: Cardiopulmonary;;   BRONCHIAL NEEDLE ASPIRATION BIOPSY  07/22/2021   Procedure: BRONCHIAL NEEDLE ASPIRATION BIOPSIES;  Surgeon: Collene Gobble, MD;  Location: [facility];  Service: Cardiopulmonary;;   CORONARY ARTERY BYPASS GRAFT N/A 05/13/2021   Procedure: CORONARY ARTERY BYPASS GRAFTING (CABG) TIMES 3  , ON PUMP, USING LEFT INTERNAL MAMMARY ARTERY AND RIGHT GREATER SAPHENOUS VEIN HARVESTED ENDOSCOPICALLY;  Surgeon: Lajuana Matte, MD;  Location:  [facility];  Service: Open Heart Surgery;  Laterality: N/A;   ENDOVEIN HARVEST OF GREATER SAPHENOUS VEIN Right 05/13/2021   Procedure: ENDOVEIN HARVEST OF GREATER SAPHENOUS VEIN;  Surgeon: Lajuana Matte, MD;  Location: [facility];  Service: Open Heart Surgery;  Laterality: Right;   epidural     back pain   IR CT SPINE LTD  06/18/2020   IR KYPHO LUMBAR INC FX REDUCE BONE BX UNI/BIL CANNULATION INC/IMAGING  06/18/2020   LEFT HEART CATH AND CORONARY ANGIOGRAPHY N/A 05/06/2021   Procedure: LEFT HEART CATH AND CORONARY ANGIOGRAPHY;  Surgeon: [name], MD;  Location: [facility];  Service: Cardiovascular;  Laterality: N/A;   LUNG BIOPSY Right 05/13/2021   Procedure: RIGHT LUNG BIOPSY;  Surgeon: Lajuana Matte, MD;  Location: [facility];  Service: Open Heart Surgery;  Laterality: Right;   MINOR HEMORRHOIDECTOMY     SHOULDER SURGERY     TEE WITHOUT CARDIOVERSION N/A 05/13/2021   Procedure: TRANSESOPHAGEAL ECHOCARDIOGRAM (TEE);  Surgeon: Lajuana Matte, MD;  Location: [facility];  Service: Open Heart Surgery;  Laterality: N/A;   TRANSURETHRAL RESECTION OF BLADDER TUMOR  2014   TRANSURETHRAL RESECTION OF BLADDER TUMOR N/A 05/19/2018   Procedure: TRANSURETHRAL RESECTION OF BLADDER TUMOR (TURBT) AND (INTRAVESICAL GEMCITABINE INSTILLED IN BLADDER IN PACU) ;  Surgeon: Franchot Gallo, MD;  Location: [facility];  Service: Urology;  Laterality: N/A;  [place] Bilateral 07/22/2021   Procedure: VIDEO BRONCHOSCOPY WITH ENDOBRONCHIAL ULTRASOUND;  Surgeon: Collene Gobble, MD;  Location[facility];  Service: Cardiopulmonary;  Laterality: Bilateral;  Home Medications:  Allergies as of 09/02/2021   No Known Allergies      Medication List        Accurate as of Sep 02, 2021  8:43 AM. If you have any questions, ask your nurse or doctor.          albuterol 108 (90 Base) MCG/ACT inhaler Commonly known as: VENTOLIN HFA Inhale 1-2 puffs into the lungs every  6 (six) hours as needed for wheezing or shortness of breath.   aspirin EC 81 MG tablet Take 81 mg by mouth daily.   atorvastatin 80 MG tablet Commonly known as: LIPITOR Take 1 tablet (80 mg total) by mouth daily.   CALCIUM 600 + D PO Take 1,200 mg by mouth daily.   clopidogrel 75 MG tablet Commonly known as: Plavix Take 1 tablet (75 mg total) by mouth daily. Okay to restart this medication on 07/23/2021   Dialyvite Vitamin D 5000 125 MCG (5000 UT) capsule Generic drug: Cholecalciferol Take 5,000 Units by mouth daily.   metoprolol tartrate 25 MG tablet Commonly known as: LOPRESSOR Take 1 tablet (25 mg total) by mouth 2 (two) times daily.   nitroGLYCERIN 0.4 MG SL tablet Commonly known as: NITROSTAT Place 0.4 mg under the tongue every 5 (five) minutes as needed for chest pain.   prochlorperazine 10 MG tablet Commonly known as: COMPAZINE Take 1 tablet (10 mg total) by mouth every 6 (six) hours as needed for nausea or vomiting.   tamsulosin 0.4 MG Caps capsule Commonly known as: FLOMAX Take 0.4 mg by mouth daily.   traZODone 50 MG tablet Commonly known as: DESYREL Take 25-50 mg by mouth at bedtime as needed for sleep.   Trelegy Ellipta 100-62.5-25 MCG/ACT Aepb Generic drug: Fluticasone-Umeclidin-Vilant Inhale 1 puff into the lungs daily.        Allergies: No Known Allergies  Family History  Problem Relation Age of Onset   Alzheimer's disease Mother 42   Congestive Heart Failure Father 39   Heart disease Brother 77   Heart failure Brother 29   Heart failure Brother 24    Social History:  reports that he quit smoking about 26 years ago. His smoking use included cigarettes. He has a 30.00 pack-year smoking history. He has never used smokeless tobacco. He reports that he does not drink alcohol and does not use drugs.  ROS: A complete review of systems was performed.  All systems are negative except for pertinent findings as noted.  Physical Exam:  Vital signs  in last 24 hours: There were no vitals taken for this visit. Constitutional:  Alert and oriented, No acute distress Cardiovascular: Regular rate  Respiratory: Normal respiratory effort Neurologic: Grossly intact, no focal deficits Psychiatric: Normal mood and affect  I have reviewed prior pt notes  I have reviewed notes from referring/previous physicians   I have independently reviewed prior imaging-I reviewed PET scan images with him and his wife.  No extrathoracic disease.  It does look like he has a bladder stone.   Impression/Assessment:  1.  BPH, doing well without any medical therapy at the present time  2.  History of bladder cancer, no evidence of recurrence at his last visit  3.  Probable bladder calculus, asymptomatic  Plan:  1.  I reassured him about his stone-we will check up on this at his next cystoscopy scheduled for October this year  2.  Multiple questions answered-I am not concerned that his bladder cancer is related to his lung cancer which  is an

## 2021-09-03 ENCOUNTER — Other Ambulatory Visit: Payer: Self-pay

## 2021-09-03 ENCOUNTER — Ambulatory Visit
Admission: RE | Admit: 2021-09-03 | Discharge: 2021-09-03 | Disposition: A | Discharge status: Home / Self Care | Payer: Medicare HMO | Source: Ambulatory Visit | Attending: Radiation Oncology | Admitting: Radiation Oncology

## 2021-09-03 DIAGNOSIS — Z87891 Personal history of nicotine dependence: Secondary | ICD-10-CM | POA: Diagnosis not present

## 2021-09-03 DIAGNOSIS — R918 Other nonspecific abnormal finding of lung field: Secondary | ICD-10-CM | POA: Diagnosis not present

## 2021-09-03 DIAGNOSIS — C3411 Malignant neoplasm of upper lobe, right bronchus or lung: Secondary | ICD-10-CM | POA: Diagnosis not present

## 2021-09-03 DIAGNOSIS — Z510 Encounter for antineoplastic radiation therapy: Secondary | ICD-10-CM | POA: Diagnosis not present

## 2021-09-03 LAB — RAD ONC ARIA SESSION SUMMARY
Course Elapsed Days: 23
Plan Fractions Treated to Date: 18
Plan Prescribed Dose Per Fraction: 2 Gy
Plan Total Fractions Prescribed: 30
Plan Total Prescribed Dose: 60 Gy
Reference Point Dosage Given to Date: 36 Gy
Reference Point Session Dosage Given: 2 Gy
Session Number: 18

## 2021-09-04 ENCOUNTER — Ambulatory Visit
Admission: RE | Admit: 2021-09-04 | Discharge: 2021-09-04 | Disposition: A | Discharge status: Home / Self Care | Payer: Medicare HMO | Source: Ambulatory Visit | Attending: Radiation Oncology | Admitting: Radiation Oncology

## 2021-09-04 ENCOUNTER — Other Ambulatory Visit: Payer: Self-pay

## 2021-09-04 DIAGNOSIS — Z510 Encounter for antineoplastic radiation therapy: Secondary | ICD-10-CM | POA: Diagnosis not present

## 2021-09-04 DIAGNOSIS — R918 Other nonspecific abnormal finding of lung field: Secondary | ICD-10-CM | POA: Diagnosis not present

## 2021-09-04 DIAGNOSIS — Z87891 Personal history of nicotine dependence: Secondary | ICD-10-CM | POA: Diagnosis not present

## 2021-09-04 DIAGNOSIS — C3411 Malignant neoplasm of upper lobe, right bronchus or lung: Secondary | ICD-10-CM | POA: Diagnosis not present

## 2021-09-04 LAB — RAD ONC ARIA SESSION SUMMARY
Course Elapsed Days: 24
Plan Fractions Treated to Date: 19
Plan Prescribed Dose Per Fraction: 2 Gy
Plan Total Fractions Prescribed: 30
Plan Total Prescribed Dose: 60 Gy
Reference Point Dosage Given to Date: 38 Gy
Reference Point Session Dosage Given: 2 Gy
Session Number: 19

## 2021-09-05 ENCOUNTER — Ambulatory Visit
Admission: RE | Admit: 2021-09-05 | Discharge: 2021-09-05 | Disposition: A | Discharge status: Home / Self Care | Payer: Medicare HMO | Source: Ambulatory Visit | Attending: Radiation Oncology | Admitting: Radiation Oncology

## 2021-09-05 ENCOUNTER — Other Ambulatory Visit: Payer: Self-pay

## 2021-09-05 DIAGNOSIS — Z87891 Personal history of nicotine dependence: Secondary | ICD-10-CM | POA: Diagnosis not present

## 2021-09-05 DIAGNOSIS — R918 Other nonspecific abnormal finding of lung field: Secondary | ICD-10-CM | POA: Diagnosis not present

## 2021-09-05 DIAGNOSIS — Z510 Encounter for antineoplastic radiation therapy: Secondary | ICD-10-CM | POA: Diagnosis not present

## 2021-09-05 DIAGNOSIS — C3411 Malignant neoplasm of upper lobe, right bronchus or lung: Secondary | ICD-10-CM | POA: Diagnosis not present

## 2021-09-05 LAB — RAD ONC ARIA SESSION SUMMARY
Course Elapsed Days: 25
Plan Fractions Treated to Date: 20
Plan Prescribed Dose Per Fraction: 2 Gy
Plan Total Fractions Prescribed: 30
Plan Total Prescribed Dose: 60 Gy
Reference Point Dosage Given to Date: 40 Gy
Reference Point Session Dosage Given: 2 Gy
Session Number: 20

## 2021-09-05 MED FILL — Dexamethasone Sodium Phosphate Inj 100 MG/10ML: INTRAMUSCULAR | Qty: 1 | Status: AC

## 2021-09-09 ENCOUNTER — Other Ambulatory Visit: Payer: Self-pay | Admitting: Internal Medicine

## 2021-09-09 ENCOUNTER — Other Ambulatory Visit: Payer: Self-pay

## 2021-09-09 ENCOUNTER — Inpatient Hospital Stay: Payer: Medicare HMO

## 2021-09-09 ENCOUNTER — Ambulatory Visit
Admission: RE | Admit: 2021-09-09 | Discharge: 2021-09-09 | Disposition: A | Discharge status: Home / Self Care | Payer: Medicare HMO | Source: Ambulatory Visit | Attending: Radiation Oncology | Admitting: Radiation Oncology

## 2021-09-09 VITALS — BP 122/69 | HR 78 | Temp 97.8°F | Resp 17 | Wt 175.2 lb

## 2021-09-09 DIAGNOSIS — R0609 Other forms of dyspnea: Secondary | ICD-10-CM | POA: Diagnosis not present

## 2021-09-09 DIAGNOSIS — C3491 Malignant neoplasm of unspecified part of right bronchus or lung: Secondary | ICD-10-CM

## 2021-09-09 DIAGNOSIS — C3411 Malignant neoplasm of upper lobe, right bronchus or lung: Secondary | ICD-10-CM | POA: Diagnosis not present

## 2021-09-09 DIAGNOSIS — R5383 Other fatigue: Secondary | ICD-10-CM | POA: Diagnosis not present

## 2021-09-09 DIAGNOSIS — Z87891 Personal history of nicotine dependence: Secondary | ICD-10-CM | POA: Diagnosis not present

## 2021-09-09 DIAGNOSIS — R918 Other nonspecific abnormal finding of lung field: Secondary | ICD-10-CM | POA: Diagnosis not present

## 2021-09-09 DIAGNOSIS — C778 Secondary and unspecified malignant neoplasm of lymph nodes of multiple regions: Secondary | ICD-10-CM | POA: Diagnosis not present

## 2021-09-09 DIAGNOSIS — D701 Agranulocytosis secondary to cancer chemotherapy: Secondary | ICD-10-CM | POA: Diagnosis not present

## 2021-09-09 DIAGNOSIS — Z510 Encounter for antineoplastic radiation therapy: Secondary | ICD-10-CM | POA: Diagnosis not present

## 2021-09-09 DIAGNOSIS — R059 Cough, unspecified: Secondary | ICD-10-CM | POA: Diagnosis not present

## 2021-09-09 DIAGNOSIS — Z5189 Encounter for other specified aftercare: Secondary | ICD-10-CM | POA: Diagnosis not present

## 2021-09-09 DIAGNOSIS — Z5111 Encounter for antineoplastic chemotherapy: Secondary | ICD-10-CM | POA: Diagnosis not present

## 2021-09-09 LAB — CMP (CANCER CENTER ONLY)
ALT: 11 U/L (ref 0–44)
AST: 11 U/L — ABNORMAL LOW (ref 15–41)
Albumin: 3.9 g/dL (ref 3.5–5.0)
Alkaline Phosphatase: 90 U/L (ref 38–126)
Anion gap: 5 (ref 5–15)
BUN: 22 mg/dL (ref 8–23)
CO2: 26 mmol/L (ref 22–32)
Calcium: 9.5 mg/dL (ref 8.9–10.3)
Chloride: 103 mmol/L (ref 98–111)
Creatinine: 1.14 mg/dL (ref 0.61–1.24)
GFR, Estimated: >60 mL/min (ref >60)
Glucose, Bld: 100 mg/dL — ABNORMAL HIGH (ref 70–99)
Potassium: 4.7 mmol/L (ref 3.5–5.1)
Sodium: 134 mmol/L — ABNORMAL LOW (ref 135–145)
Total Bilirubin: 1 mg/dL (ref 0.3–1.2)
Total Protein: 6.9 g/dL (ref 6.5–8.1)

## 2021-09-09 LAB — CBC WITH DIFFERENTIAL (CANCER CENTER ONLY)
Abs Immature Granulocytes: 0.01 10*3/uL (ref 0.00–0.07)
Basophils Absolute: 0 10*3/uL (ref 0.0–0.1)
Basophils Relative: 1 %
Eosinophils Absolute: 0.1 10*3/uL (ref 0.0–0.5)
Eosinophils Relative: 3 %
HCT: 31.8 % — ABNORMAL LOW (ref 39.0–52.0)
Hemoglobin: 10.6 g/dL — ABNORMAL LOW (ref 13.0–17.0)
Immature Granulocytes: 0 %
Lymphocytes Relative: 11 %
Lymphs Abs: 0.4 10*3/uL — ABNORMAL LOW (ref 0.7–4.0)
MCH: 29.8 pg (ref 26.0–34.0)
MCHC: 33.3 g/dL (ref 30.0–36.0)
MCV: 89.3 fL (ref 80.0–100.0)
Monocytes Absolute: 0.1 10*3/uL (ref 0.1–1.0)
Monocytes Relative: 3 %
Neutro Abs: 3 10*3/uL (ref 1.7–7.7)
Neutrophils Relative %: 82 %
Platelet Count: 188 10*3/uL (ref 150–400)
RBC: 3.56 MIL/uL — ABNORMAL LOW (ref 4.22–5.81)
RDW: 13.9 % (ref 11.5–15.5)
WBC Count: 3.6 10*3/uL — ABNORMAL LOW (ref 4.0–10.5)
nRBC: 0 % (ref 0.0–0.2)

## 2021-09-09 LAB — RAD ONC ARIA SESSION SUMMARY
Course Elapsed Days: 29
Plan Fractions Treated to Date: 21
Plan Prescribed Dose Per Fraction: 2 Gy
Plan Total Fractions Prescribed: 30
Plan Total Prescribed Dose: 60 Gy
Reference Point Dosage Given to Date: 42 Gy
Reference Point Session Dosage Given: 2 Gy
Session Number: 21

## 2021-09-09 MED ORDER — FAMOTIDINE IN NACL 20-0.9 MG/50ML-% IV SOLN
20.0000 mg | Freq: Once | INTRAVENOUS | Status: AC
  Administered 2021-09-09: 20 mg via INTRAVENOUS
  Filled 2021-09-09: qty 50

## 2021-09-09 MED ORDER — SODIUM CHLORIDE 0.9 % IV SOLN: Freq: Once | INTRAVENOUS | Status: AC

## 2021-09-09 MED ORDER — SODIUM CHLORIDE 0.9 % IV SOLN
45.0000 mg/m2 | Freq: Once | INTRAVENOUS | Status: AC
  Administered 2021-09-09: 90 mg via INTRAVENOUS
  Filled 2021-09-09: qty 15

## 2021-09-09 MED ORDER — SODIUM CHLORIDE 0.9 % IV SOLN
168.6000 mg | Freq: Once | INTRAVENOUS | Status: AC
  Administered 2021-09-09: 170 mg via INTRAVENOUS
  Filled 2021-09-09: qty 17

## 2021-09-09 MED ORDER — PALONOSETRON HCL INJECTION 0.25 MG/5ML
0.2500 mg | Freq: Once | INTRAVENOUS | Status: AC
  Administered 2021-09-09: 0.25 mg via INTRAVENOUS
  Filled 2021-09-09: qty 5

## 2021-09-09 MED ORDER — SUCRALFATE 1 GM/10ML PO SUSP: 1.0000 g | Freq: Three times a day (TID) | ORAL | 0 refills | Status: DC

## 2021-09-09 MED ORDER — SODIUM CHLORIDE 0.9 % IV SOLN
10.0000 mg | Freq: Once | INTRAVENOUS | Status: AC
  Administered 2021-09-09: 10 mg via INTRAVENOUS
  Filled 2021-09-09: qty 1
  Filled 2021-09-09: qty 10

## 2021-09-09 MED ORDER — DIPHENHYDRAMINE HCL 50 MG/ML IJ SOLN
50.0000 mg | Freq: Once | INTRAMUSCULAR | Status: AC
  Administered 2021-09-09: 50 mg via INTRAVENOUS
  Filled 2021-09-09: qty 1

## 2021-09-09 MED ORDER — SONAFINE EX EMUL
1.0000 "application " | Freq: Once | CUTANEOUS | Status: AC
  Administered 2021-09-09: 1 via TOPICAL

## 2021-09-09 NOTE — Patient Instructions (Signed)
[facility]  Discharge Instructions: Thank you for choosing [facility] to provide your oncology and hematology care.   If you have a lab appointment with the [facility], please go directly to the [facility] and check in at the registration area.   Wear comfortable clothing and clothing appropriate for easy access to any Portacath or PICC line.   We strive to give you quality time with your provider. You may need to reschedule your appointment if you arrive late (15 or more minutes).  Arriving late affects you and other patients whose appointments are after yours.  Also, if you miss three or more appointments without notifying the office, you may be dismissed from the clinic at the provider's discretion.      For prescription refill requests, have your pharmacy contact our office and allow 72 hours for refills to be completed.    Today you received the following chemotherapy and/or immunotherapy agents: Taxol, Carboplatin      To help prevent nausea and vomiting after your treatment, we encourage you to take your nausea medication as directed.  BELOW ARE SYMPTOMS THAT SHOULD BE REPORTED IMMEDIATELY: *FEVER GREATER THAN 100.4 F (38 C) OR HIGHER *CHILLS OR SWEATING *NAUSEA AND VOMITING THAT IS NOT CONTROLLED WITH YOUR NAUSEA MEDICATION *UNUSUAL SHORTNESS OF BREATH *UNUSUAL BRUISING OR BLEEDING *URINARY PROBLEMS (pain or burning when urinating, or frequent urination) *BOWEL PROBLEMS (unusual diarrhea, constipation, pain near the anus) TENDERNESS IN MOUTH AND THROAT WITH OR WITHOUT PRESENCE OF ULCERS (sore throat, sores in mouth, or a toothache) UNUSUAL RASH, SWELLING OR PAIN  UNUSUAL VAGINAL DISCHARGE OR ITCHING   Items with * indicate a potential emergency and should be followed up as soon as possible or go to the Emergency Department if any problems should occur.  Please show the CHEMOTHERAPY ALERT CARD or IMMUNOTHERAPY ALERT CARD at  check-in to the Emergency Department and triage nurse.  Should you have questions after your visit or need to cancel or reschedule your appointment, please contact [facility]  Dept: [phone]  and follow the prompts.  Office hours are 8:00 a.m. to 4:30 p.m. Monday - Friday. Please note that voicemails left after 4:00 p.m. may not be returned until the following business day.  We are closed weekends and major holidays. You have access to a nurse at all times for urgent questions. Please call the main number to the clinic Dept: [phone] and follow the prompts.   For any non-urgent questions, you may also contact your provider using MyChart. We now offer e-Visits for anyone 1 and older to request care online for non-urgent symptoms. For details visit [email or web address].   Also download the MyChart app! Go to the app store, search "MyChart", open the app, select [facility], and log in with your MyChart username and password.  Due to Covid, a mask is required upon entering the hospital/clinic. If you do not have a mask, one will be given to you upon arrival. For doctor visits, patients may have 1 support person aged 51 or older with them. For treatment visits, patients cannot have anyone with them due to current Covid guidelines and our immunocompromised population.

## 2021-09-09 NOTE — Progress Notes (Signed)
Kevin [name] OFFICE PROGRESS NOTE  [name], MD [place] [address]  DIAGNOSIS: Stage IIIC (T3, N3, M0) non-small cell lung cancer, adenocarcinoma presented with right upper lobe lung mass status post right upper lobe wedge resection but no lymph node dissection.  The tumor measured 3.3 cm with visceral pleural involvement and very close resection margin less than 0.1 cm from the tumor.  This was performed on May 13, 2021 in the same setting of CABG under the care of Dr. Kipp Brood.  PET scan performed in March 2023 showed a right upper lobe nodule adjacent to the wedge resection site small hypermetabolic right hilar, right subcarinal, and left infrahilar lymph nodes which could be reactive but are suspicious for nodal involvement.   Molecular studies showed no actionable mutations and PD-L1 expression was 0%  PRIOR THERAPY: Right upper lobe wedge resection under the care of Dr. Kipp Brood on 05/13/2021  CURRENT THERAPY: Concurrent chemoradiation with weekly carboplatin for AUC of 2 and paclitaxel 45 Mg/M2.  First dose Aug 11, 2021. Status post 4 cycles.   INTERVAL HISTORY: Kevin [name] 81 y.o. male returns to the clinic today for a follow up visit accompanied by his wife.  The patient recently underwent right upper lobe bronchoscopy and biopsy under the care of Dr. Lamonte Sakai in April 2023 and the final pathology was positive for non-small cell lung cancer, adenocarcinoma.  The patient; therefore, has started treatment with concurrent chemoradiation.  He status post 4 cycles of treatment last week and he tolerated it well without any concerning adverse side effects except he is starting to have some odynophagia and dysphagia.  He has a prescription for Carafate and viscous lidocaine.  He has lost approximately 10 pounds.  His wife is making him protein shakes.  He also mentions that he has taste alterations.  No evidence of thrush.  His last day radiation is tentatively  scheduled for 09/25/2021.  Of note, the patient has a CT scan scheduled for tomorrow.  This was an old order and his restaging scan to assess his response to his current treatment should be scheduled for early July 2023.  He denies any fever, chills, or night sweats. He met with the member the nutritionist team while in the infusion room last week. His wife has been making him milkshakes. The patient denies any chest pain, cough, or hemoptysis.  He notes he may have mild shortness of breath with exertion, such as walking up an incline.  He denies any nausea, vomiting, diarrhea, or constipation.  Denies any headache or visual changes. The patient is here today for evaluation and repeat blood work before starting cycle #5    MEDICAL HISTORY: Past Medical History:  Diagnosis Date   Arthritis    Coronary artery disease    Hyperlipidemia     ALLERGIES:  has No Known Allergies.  MEDICATIONS:  Current Outpatient Medications  Medication Sig Dispense Refill   albuterol (VENTOLIN HFA) 108 (90 Base) MCG/ACT inhaler Inhale 1-2 puffs into the lungs every 6 (six) hours as needed for wheezing or shortness of breath. 18 g 3   aspirin EC 81 MG tablet Take 81 mg by mouth daily.     atorvastatin (LIPITOR) 80 MG tablet Take 1 tablet (80 mg total) by mouth daily. 90 tablet 3   Calcium Carb-Cholecalciferol (CALCIUM 600 + D PO) Take 1,200 mg by mouth daily.     Cholecalciferol (DIALYVITE VITAMIN D 5000) 125 MCG (5000 UT) capsule Take 5,000 Units  by mouth daily.     clopidogrel (PLAVIX) 75 MG tablet Take 1 tablet (75 mg total) by mouth daily. Okay to restart this medication on 07/23/2021 90 tablet 2   lidocaine (XYLOCAINE) 2 % solution Use as directed 10 mLs in the mouth or throat every 4 (four) hours as needed. 450 mL 1   metoprolol tartrate (LOPRESSOR) 25 MG tablet Take 1 tablet (25 mg total) by mouth 2 (two) times daily. 180 tablet 3   nitroGLYCERIN (NITROSTAT) 0.4 MG SL tablet Place 0.4 mg under the tongue every 5  (five) minutes as needed for chest pain.     prochlorperazine (COMPAZINE) 10 MG tablet Take 1 tablet (10 mg total) by mouth every 6 (six) hours as needed for nausea or vomiting. 30 tablet 0   sucralfate (CARAFATE) 1 GM/10ML suspension Take 1 g by mouth 4 (four) times daily.     Fluticasone-Umeclidin-Vilant (TRELEGY ELLIPTA) 100-62.5-25 MCG/ACT AEPB Inhale 1 puff into the lungs daily. (Patient not taking: Reported on 09/15/2021) 60 each 0   No current facility-administered medications for this visit.    SURGICAL HISTORY:  Past Surgical History:  Procedure Laterality Date   BRONCHIAL BIOPSY  07/22/2021   Procedure: BRONCHIAL BIOPSIES;  Surgeon: Collene Gobble, MD;  Location: [place];  Service: Cardiopulmonary;;   BRONCHIAL BRUSHINGS  07/22/2021   Procedure: BRONCHIAL BRUSHINGS;  Surgeon: Collene Gobble, MD;  Location: [place];  Service: Cardiopulmonary;;   BRONCHIAL NEEDLE ASPIRATION BIOPSY  07/22/2021   Procedure: BRONCHIAL NEEDLE ASPIRATION BIOPSIES;  Surgeon: Collene Gobble, MD;  Location: [facility];  Service: Cardiopulmonary;;   CORONARY ARTERY BYPASS GRAFT N/A 05/13/2021   Procedure: CORONARY ARTERY BYPASS GRAFTING (CABG) TIMES 3  , ON PUMP, USING LEFT INTERNAL MAMMARY ARTERY AND RIGHT GREATER SAPHENOUS VEIN HARVESTED ENDOSCOPICALLY;  Surgeon: Lajuana Matte, MD;  Location: [place];  Service: Open Heart Surgery;  Laterality: N/A;   ENDOVEIN HARVEST OF GREATER SAPHENOUS VEIN Right 05/13/2021   Procedure: ENDOVEIN HARVEST OF GREATER SAPHENOUS VEIN;  Surgeon: Lajuana Matte, MD;  Location: [place];  Service: Open Heart Surgery;  Laterality: Right;   epidural     back pain   IR CT SPINE LTD  06/18/2020   IR KYPHO LUMBAR INC FX REDUCE BONE BX UNI/BIL CANNULATION INC/IMAGING  06/18/2020   LEFT HEART CATH AND CORONARY ANGIOGRAPHY N/A 05/06/2021   Procedure: LEFT HEART CATH AND CORONARY ANGIOGRAPHY;  Surgeon: [name], MD;  Location: [facility];  Service: Cardiovascular;   Laterality: N/A;   LUNG BIOPSY Right 05/13/2021   Procedure: RIGHT LUNG BIOPSY;  Surgeon: Lajuana Matte, MD;  Location: [place];  Service: Open Heart Surgery;  Laterality: Right;   MINOR HEMORRHOIDECTOMY     SHOULDER SURGERY     TEE WITHOUT CARDIOVERSION N/A 05/13/2021   Procedure: TRANSESOPHAGEAL ECHOCARDIOGRAM (TEE);  Surgeon: Lajuana Matte, MD;  Location: [place];  Service: Open Heart Surgery;  Laterality: N/A;   TRANSURETHRAL RESECTION OF BLADDER TUMOR  2014   TRANSURETHRAL RESECTION OF BLADDER TUMOR N/A 05/19/2018   Procedure: TRANSURETHRAL RESECTION OF BLADDER TUMOR (TURBT) AND (INTRAVESICAL GEMCITABINE INSTILLED IN BLADDER IN PACU) ;  Surgeon: Franchot Gallo, MD;  Location: [facility];  Service: Urology;  Laterality: N/A;  [place] Bilateral 07/22/2021   Procedure: VIDEO BRONCHOSCOPY WITH ENDOBRONCHIAL ULTRASOUND;  Surgeon: Collene Gobble, MD;  Location: [facility];  Service: Cardiopulmonary;  Laterality: Bilateral;    REVIEW OF SYSTEMS:  Review of Systems  Constitutional: Positive for appetite change and weight loss.  Negative for chills, fatigue,  and fever. HENT: Positive for radiation esophagitis.  Negative for mouth sores, nosebleeds, sore throat. Eyes: Negative for eye problems and icterus.  Respiratory: Positive for mild dyspnea on exertion.  Negative for cough, hemoptysis,  and wheezing.   Cardiovascular: Negative for chest pain and leg swelling.  Gastrointestinal: Negative for abdominal pain, constipation, diarrhea, nausea and vomiting.  Genitourinary: Negative for bladder incontinence, difficulty urinating, dysuria, frequency and hematuria.   Musculoskeletal: Negative for back pain, gait problem, neck pain and neck stiffness.  Skin: Positive for radiation burn on his chest and back. Neurological: Negative for dizziness, extremity weakness, gait problem, headaches, light-headedness and seizures.  Hematological:  Negative for adenopathy. Does not bruise/bleed easily.  Psychiatric/Behavioral: Negative for confusion, depression and sleep disturbance. The patient is not nervous/anxious.     PHYSICAL EXAMINATION:  Blood pressure (!) 103/54, pulse 78, temperature (!) 96 F (35.6 C), temperature source Tympanic, resp. rate 18, weight 168 lb 1 oz (76.2 kg), SpO2 98 %.  ECOG PERFORMANCE STATUS: 1  Physical Exam  Constitutional: Oriented to person, place, and time and well-developed, well-nourished, and in no distress.  HENT:  Head: Normocephalic and atraumatic.  Mouth/Throat: Oropharynx is clear and moist. No oropharyngeal exudate.  Eyes: Conjunctivae are normal. Right eye exhibits no discharge. Left eye exhibits no discharge. No scleral icterus.  Neck: Normal range of motion. Neck supple.  Cardiovascular: Normal rate, regular rhythm, normal heart sounds and intact distal pulses.   Pulmonary/Chest: Effort normal and breath sounds normal. No respiratory distress. No wheezes. No rales.  Abdominal: Soft. Bowel sounds are normal. Exhibits no distension and no mass. There is no tenderness.  Musculoskeletal: Normal range of motion. Exhibits no edema.  Lymphadenopathy:    No cervical adenopathy.  Neurological: Alert and oriented to person, place, and time. Exhibits normal muscle tone. Gait normal. Coordination normal.  Skin: Skin is warm and dry.  Positive for radiation burn on his chest and back.  Not diaphoretic. No erythema. No pallor.  Psychiatric: Mood, memory and judgment normal.  Vitals reviewed.  LABORATORY DATA: Lab Results  Component Value Date   WBC 0.8 (LL) 09/15/2021   HGB 10.5 (L) 09/15/2021   HCT 31.3 (L) 09/15/2021   MCV 88.9 09/15/2021   PLT 139 (L) 09/15/2021      Chemistry      Component Value Date/Time   NA 136 09/15/2021 0853   NA 139 05/01/2021 1146   K 4.0 09/15/2021 0853   CL 104 09/15/2021 0853   CO2 25 09/15/2021 0853   BUN 24 (H) 09/15/2021 0853   BUN 20 05/01/2021  1146   CREATININE 1.22 09/15/2021 0853      Component Value Date/Time   CALCIUM 9.3 09/15/2021 0853   ALKPHOS 69 09/15/2021 0853   AST 10 (L) 09/15/2021 0853   ALT 7 09/15/2021 0853   BILITOT 1.3 (H) 09/15/2021 0853       RADIOGRAPHIC STUDIES:  No results found.   ASSESSMENT/PLAN:  This is a very pleasant 81 year old Caucasian male diagnosed with stage IIIc (T3, N3, M0) non-small cell lung cancer, adenocarcinoma.  The patient is negative for any actionable mutations.  His PD-L1 expression is 0%.  The patient was diagnosed in January 2023 after status post wedge resection of the right upper lobe lung mass in the setting of a CABG under the care of Dr. Kipp Brood.  The patient's staging PET scan showed residual disease  in the right upper lobe in addition to hypermetabolic right hilar, subcarinal, and left infrahilar lymph nodes.  Dr. Lamonte Sakai performed a repeat bronchoscopy and biopsy of the right upper lobe mass as well as the subcarinal and left hilar lymph nodes which were positive for malignancy consistent with non-small cell lung cancer, adenocarcinoma.  The patient is currently undergoing concurrent chemoradiation with carboplatin for an AUC of 2 and paclitaxel 45 mg per metered squared.  He tolerated well without any concerning adverse side effects. He is status post 4 cycles.     Labs were reviewed.  His total white blood cell count is 0.8 today.  His ANC 0.5.  He will not receive any treatment today.  Instead, we will arrange for Zarxio injections daily today, tomorrow, and Wednesday of this week.  The patient denies any signs and symptoms of infection at this time.  He was advised to please call us if he develops any signs and symptoms of infection.   We will recheck the patient's labs next week on 09/22/2021.  If adequate for treatment, he will complete cycle #5 at that time.   Because the patient has had poor p.o. intake due to his radiation esophagitis, we will arrange for him to  receive 1 L of fluid while in the infusion room today.  We have also reach out to radiation oncology to see if they have any additional recommendations.  The patient is determined to complete his radiation as expected.  He will also continue to use the cream supplied by radiation oncology for his burns on his chest and back.  He has his CT scan scheduled for tomorrow which is too early.  We will reach out to radiology to reschedule his restaging CT scan to be performed approximately 3 weeks after his last day radiation.  This would put the scan to be out until early July.  We will see the patient back for follow-up visit in 4 weeks for evaluation and to review his scan.    We rediscussed small frequent meals and the importance of good nutrition.   The patient was advised to call immediately if he has any concerning symptoms in the interval. The patient voices understanding of current disease status and treatment options and is in agreement with the current care plan. All questions were answered. The patient knows to call the clinic with any problems, questions or concerns. We can certainly see the patient much sooner if necessary   No orders of the defined types were placed in this encounter.     The total time spent in the appointment was 30-39 minutes.   Shriyans Kuenzi L Ralyn Stlaurent, PA-C 09/15/21

## 2021-09-10 ENCOUNTER — Other Ambulatory Visit: Payer: Self-pay

## 2021-09-10 ENCOUNTER — Ambulatory Visit
Admission: RE | Admit: 2021-09-10 | Discharge: 2021-09-10 | Disposition: A | Discharge status: Home / Self Care | Payer: Medicare HMO | Source: Ambulatory Visit | Attending: Radiation Oncology | Admitting: Radiation Oncology

## 2021-09-10 ENCOUNTER — Emergency Department (HOSPITAL_COMMUNITY)
Admission: EM | Admit: 2021-09-10 | Discharge: 2021-09-10 | Disposition: A | Discharge status: Home / Self Care | Payer: Medicare HMO | Source: Home / Self Care | Attending: Emergency Medicine | Admitting: Emergency Medicine

## 2021-09-10 ENCOUNTER — Encounter (HOSPITAL_COMMUNITY): Payer: Self-pay

## 2021-09-10 DIAGNOSIS — Z85110 Personal history of malignant carcinoid tumor of bronchus and lung: Secondary | ICD-10-CM | POA: Insufficient documentation

## 2021-09-10 DIAGNOSIS — Z7902 Long term (current) use of antithrombotics/antiplatelets: Secondary | ICD-10-CM | POA: Insufficient documentation

## 2021-09-10 DIAGNOSIS — E860 Dehydration: Secondary | ICD-10-CM | POA: Insufficient documentation

## 2021-09-10 DIAGNOSIS — C3411 Malignant neoplasm of upper lobe, right bronchus or lung: Secondary | ICD-10-CM | POA: Diagnosis not present

## 2021-09-10 DIAGNOSIS — I2510 Atherosclerotic heart disease of native coronary artery without angina pectoris: Secondary | ICD-10-CM | POA: Insufficient documentation

## 2021-09-10 DIAGNOSIS — Z510 Encounter for antineoplastic radiation therapy: Secondary | ICD-10-CM | POA: Diagnosis not present

## 2021-09-10 DIAGNOSIS — Z87891 Personal history of nicotine dependence: Secondary | ICD-10-CM | POA: Diagnosis not present

## 2021-09-10 DIAGNOSIS — Z7982 Long term (current) use of aspirin: Secondary | ICD-10-CM | POA: Insufficient documentation

## 2021-09-10 DIAGNOSIS — K209 Esophagitis, unspecified without bleeding: Secondary | ICD-10-CM | POA: Insufficient documentation

## 2021-09-10 DIAGNOSIS — R918 Other nonspecific abnormal finding of lung field: Secondary | ICD-10-CM | POA: Diagnosis not present

## 2021-09-10 LAB — RAD ONC ARIA SESSION SUMMARY
Course Elapsed Days: 30
Plan Fractions Treated to Date: 22
Plan Prescribed Dose Per Fraction: 2 Gy
Plan Total Fractions Prescribed: 30
Plan Total Prescribed Dose: 60 Gy
Reference Point Dosage Given to Date: 44 Gy
Reference Point Session Dosage Given: 2 Gy
Session Number: 22

## 2021-09-10 LAB — BASIC METABOLIC PANEL
Anion gap: 5 (ref 5–15)
BUN: 31 mg/dL — ABNORMAL HIGH (ref 8–23)
CO2: 25 mmol/L (ref 22–32)
Calcium: 8.7 mg/dL — ABNORMAL LOW (ref 8.9–10.3)
Chloride: 105 mmol/L (ref 98–111)
Creatinine, Ser: 1.41 mg/dL — ABNORMAL HIGH (ref 0.61–1.24)
GFR, Estimated: 51 mL/min — ABNORMAL LOW (ref >60)
Glucose, Bld: 105 mg/dL — ABNORMAL HIGH (ref 70–99)
Potassium: 4.5 mmol/L (ref 3.5–5.1)
Sodium: 135 mmol/L (ref 135–145)

## 2021-09-10 LAB — CBC
HCT: 30.3 % — ABNORMAL LOW (ref 39.0–52.0)
Hemoglobin: 10.1 g/dL — ABNORMAL LOW (ref 13.0–17.0)
MCH: 29.7 pg (ref 26.0–34.0)
MCHC: 33.3 g/dL (ref 30.0–36.0)
MCV: 89.1 fL (ref 80.0–100.0)
Platelets: 185 10*3/uL (ref 150–400)
RBC: 3.4 MIL/uL — ABNORMAL LOW (ref 4.22–5.81)
RDW: 14 % (ref 11.5–15.5)
WBC: 3.5 10*3/uL — ABNORMAL LOW (ref 4.0–10.5)
nRBC: 0 % (ref 0.0–0.2)

## 2021-09-10 MED ORDER — SODIUM CHLORIDE 0.9 % IV SOLN
1000.0000 mL | INTRAVENOUS | Status: DC
  Administered 2021-09-10: 1000 mL via INTRAVENOUS

## 2021-09-10 MED ORDER — MORPHINE SULFATE (PF) 4 MG/ML IV SOLN
4.0000 mg | Freq: Once | INTRAVENOUS | Status: AC
  Administered 2021-09-10: 4 mg via INTRAVENOUS
  Filled 2021-09-10: qty 1

## 2021-09-10 MED ORDER — LIDOCAINE VISCOUS HCL 2 % MT SOLN
15.0000 mL | Freq: Once | OROMUCOSAL | Status: AC
  Administered 2021-09-10: 15 mL via ORAL
  Filled 2021-09-10: qty 15

## 2021-09-10 MED ORDER — SODIUM CHLORIDE 0.9 % IV BOLUS (SEPSIS)
1000.0000 mL | Freq: Once | INTRAVENOUS | Status: AC
  Administered 2021-09-10: 1000 mL via INTRAVENOUS

## 2021-09-10 MED ORDER — ALUM & MAG HYDROXIDE-SIMETH 200-200-20 MG/5ML PO SUSP
30.0000 mL | Freq: Once | ORAL | Status: AC
  Administered 2021-09-10: 30 mL via ORAL
  Filled 2021-09-10: qty 30

## 2021-09-10 NOTE — Discharge Instructions (Signed)
Follow-up with your radiation oncologist tomorrow.  Let them know about the symptoms you are having before your next treatment.  Continue the antacid medications.  Return as needed for worsening symptoms

## 2021-09-10 NOTE — ED Triage Notes (Signed)
Pt presents with difficulty swallowing for last couple of days that has gotten worse. Pt is cancer pt and is receiving radiation to right chest. Last radiation treatment this morning. States that throat hurts and feels tight. Denies trouble breathing and able to talk freely.

## 2021-09-10 NOTE — ED Provider Notes (Signed)
[facility] EMERGENCY DEPARTMENT Provider Note   CSN: [id number] Arrival date & time: 09/10/21  1912     History  Complaint: Dysphagia  Kevin [name] is a 81 y.o. male.  HPI    Patient has history of hyperlipidemia, coronary artery disease, arthritis adenocarcinoma of the right lung.  Patient is currently receiving radiation therapy for his right lung mass.  Patient states in the last couple days he has noticed increased irritation when swallowing.  Today patient tried to have potato soup and he had difficulty even swallowing the liquid portion of the soup.  Patient states it was very painful.  He was able to get the liquid down eventually.  He has not had any vomiting.  He is not having difficulty with his secretions.  Patient had a prescription for Carafate called informed.  He is taken 3 doses without much relief. Home Medications Prior to Admission medications   Medication Sig Start Date End Date Taking? Authorizing Provider  albuterol (VENTOLIN HFA) 108 (90 Base) MCG/ACT inhaler Inhale 1-2 puffs into the lungs every 6 (six) hours as needed for wheezing or shortness of breath. 08/11/21  Yes Icard, Octavio Graves, DO  aspirin EC 81 MG tablet Take 81 mg by mouth daily.   Yes [provider]  atorvastatin (LIPITOR) 80 MG tablet Take 1 tablet (80 mg total) by mouth daily. 07/09/21  Yes [name], MD  Calcium Carb-Cholecalciferol (CALCIUM 600 + D PO) Take 1,200 mg by mouth daily.   Yes [provider]  Cholecalciferol (DIALYVITE VITAMIN D 5000) 125 MCG (5000 UT) capsule Take 5,000 Units by mouth daily.   Yes [provider]  clopidogrel (PLAVIX) 75 MG tablet Take 1 tablet (75 mg total) by mouth daily. Okay to restart this medication on 07/23/2021 07/22/21  Yes Collene Gobble, MD  metoprolol tartrate (LOPRESSOR) 25 MG tablet Take 1 tablet (25 mg total) by mouth 2 (two) times daily. 07/09/21  Yes [name], MD  nitroGLYCERIN (NITROSTAT) 0.4 MG SL tablet Place 0.4 mg under  the tongue every 5 (five) minutes as needed for chest pain. 04/09/21  Yes [provider]  prochlorperazine (COMPAZINE) 10 MG tablet Take 1 tablet (10 mg total) by mouth every 6 (six) hours as needed for nausea or vomiting. 07/14/21  Yes Heilingoetter, Cassandra L, PA-C  Fluticasone-Umeclidin-Vilant (TRELEGY ELLIPTA) 100-62.5-25 MCG/ACT AEPB Inhale 1 puff into the lungs daily. 08/11/21   Garner Nash, DO      Allergies    Patient has no known allergies.    Review of Systems   Review of Systems  Constitutional:  Negative for fever.   Physical Exam Updated Vital Signs BP 117/76   Pulse 75   Temp 98.2 F (36.8 C) (Oral)   Resp 18   Ht 1.778 m (5\' 10" )   Wt 69.9 kg   SpO2 100%   BMI 22.10 kg/m  Physical Exam Vitals and nursing note reviewed.  Constitutional:      General: He is not in acute distress.    Appearance: He is well-developed.  HENT:     Head: Normocephalic and atraumatic.     Right Ear: External ear normal.     Left Ear: External ear normal.     Mouth/Throat:     Mouth: Mucous membranes are moist.     Pharynx: Oropharynx is clear. No oropharyngeal exudate or posterior oropharyngeal erythema.     Comments: No difficulty handling his secretions Eyes:     General: No scleral icterus.  Right eye: No discharge.        Left eye: No discharge.     Conjunctiva/sclera: Conjunctivae normal.  Neck:     Trachea: No tracheal deviation.  Cardiovascular:     Rate and Rhythm: Normal rate and regular rhythm.  Pulmonary:     Effort: Pulmonary effort is normal. No respiratory distress.     Breath sounds: Normal breath sounds. No stridor. No wheezing or rales.  Abdominal:     General: Bowel sounds are normal. There is no distension.     Palpations: Abdomen is soft.     Tenderness: There is no abdominal tenderness. There is no guarding or rebound.  Musculoskeletal:        General: No tenderness or deformity.     Cervical back: Neck supple.  Skin:    General:  Skin is warm and dry.     Findings: No rash.  Neurological:     General: No focal deficit present.     Mental Status: He is alert.     Cranial Nerves: No cranial nerve deficit (no facial droop, extraocular movements intact, no slurred speech).     Sensory: No sensory deficit.     Motor: No abnormal muscle tone or seizure activity.     Coordination: Coordination normal.  Psychiatric:        Mood and Affect: Mood normal.    ED Results / Procedures / Treatments   Labs (all labs ordered are listed, but only abnormal results are displayed) Labs Reviewed  CBC - Abnormal; Notable for the following components:      Result Value   WBC 3.5 (*)    RBC 3.40 (*)    Hemoglobin 10.1 (*)    HCT 30.3 (*)    All other components within normal limits  BASIC METABOLIC PANEL - Abnormal; Notable for the following components:   Glucose, Bld 105 (*)    BUN 31 (*)    Creatinine, Ser 1.41 (*)    Calcium 8.7 (*)    GFR, Estimated 51 (*)    All other components within normal limits    EKG None  Radiology No results found.  Procedures Procedures    Medications Ordered in ED Medications  sodium chloride 0.9 % bolus 1,000 mL (1,000 mLs Intravenous New Bag/Given 09/10/21 2201)    Followed by  0.9 %  sodium chloride infusion (1,000 mLs Intravenous New Bag/Given 09/10/21 2201)  alum & mag hydroxide-simeth (MAALOX/MYLANTA) 200-200-20 MG/5ML suspension 30 mL (30 mLs Oral Given 09/10/21 2156)    And  lidocaine (XYLOCAINE) 2 % viscous mouth solution 15 mL (15 mLs Oral Given 09/10/21 2156)  morphine (PF) 4 MG/ML injection 4 mg (4 mg Intravenous Given 09/10/21 2156)    ED Course/ Medical Decision Making/ A&P Clinical Course as of 09/10/21 2316  Wed Sep 10, 2021  6010 Basic metabolic panel(!) Creatinine increased compared to previous [JK]  2252 CBC(!) Anemia stable [JK]  2315 Patient states he is feeling better.  He has been able to swallow without pain or discomfort.  He is ready for discharge [JK]     Clinical Course User Index [JK] Dorie Rank, MD                           Medical Decision Making Problems Addressed: Dehydration: complicated acute illness or injury Esophagitis: complicated acute illness or injury  Amount and/or Complexity of Data Reviewed External Data Reviewed: notes.    Details: Oncology  notes Labs: ordered. Decision-making details documented in ED Course.  Risk OTC drugs. Prescription drug management.   Patient presented to the ED with complaints of dysphagia in the setting of known lung cancer and radiation treatments to the chest.  Patient complained of difficulty swallowing but no trouble handling secretions.  Suspect he is having radiation esophagitis.  Patient was treated with IV fluids and pain medications.  His symptoms improved significantly.  He is able to swallow and drink without difficulty now.  Patient is scheduled to see his radiation oncologist tomorrow.  Recommended he talk to them about his symptoms to see whether or not to continue with treatments tomorrow.  Evaluation and diagnostic testing in the emergency department does not suggest an emergent condition requiring admission or immediate intervention beyond what has been performed at this time.  The patient is safe for discharge and has been instructed to return immediately for worsening symptoms, change in symptoms or any other concerns.         Final Clinical Impression(s) / ED Diagnoses Final diagnoses:  Esophagitis  Dehydration    Rx / DC Orders ED Discharge Orders     None         Dorie Rank, MD 09/10/21 2318

## 2021-09-11 ENCOUNTER — Ambulatory Visit
Admission: RE | Admit: 2021-09-11 | Discharge: 2021-09-11 | Disposition: A | Discharge status: Home / Self Care | Payer: Medicare HMO | Source: Ambulatory Visit | Attending: Radiation Oncology | Admitting: Radiation Oncology

## 2021-09-11 ENCOUNTER — Other Ambulatory Visit: Payer: Self-pay | Admitting: Radiation Oncology

## 2021-09-11 ENCOUNTER — Other Ambulatory Visit: Payer: Self-pay

## 2021-09-11 ENCOUNTER — Ambulatory Visit: Payer: Medicare HMO

## 2021-09-11 DIAGNOSIS — Z87891 Personal history of nicotine dependence: Secondary | ICD-10-CM | POA: Diagnosis not present

## 2021-09-11 DIAGNOSIS — C3411 Malignant neoplasm of upper lobe, right bronchus or lung: Secondary | ICD-10-CM | POA: Diagnosis not present

## 2021-09-11 DIAGNOSIS — Z510 Encounter for antineoplastic radiation therapy: Secondary | ICD-10-CM | POA: Diagnosis not present

## 2021-09-11 DIAGNOSIS — R918 Other nonspecific abnormal finding of lung field: Secondary | ICD-10-CM | POA: Diagnosis not present

## 2021-09-11 DIAGNOSIS — C3491 Malignant neoplasm of unspecified part of right bronchus or lung: Secondary | ICD-10-CM | POA: Diagnosis not present

## 2021-09-11 LAB — RAD ONC ARIA SESSION SUMMARY
Course Elapsed Days: 31
Plan Fractions Treated to Date: 23
Plan Prescribed Dose Per Fraction: 2 Gy
Plan Total Fractions Prescribed: 30
Plan Total Prescribed Dose: 60 Gy
Reference Point Dosage Given to Date: 46 Gy
Reference Point Session Dosage Given: 2 Gy
Session Number: 23

## 2021-09-11 MED ORDER — LIDOCAINE VISCOUS HCL 2 % MT SOLN: 10.0000 mL | OROMUCOSAL | 1 refills | Status: DC | PRN

## 2021-09-12 ENCOUNTER — Telehealth: Payer: Self-pay | Admitting: Medical Oncology

## 2021-09-12 ENCOUNTER — Ambulatory Visit
Admission: RE | Admit: 2021-09-12 | Discharge: 2021-09-12 | Disposition: A | Discharge status: Home / Self Care | Payer: Medicare HMO | Source: Ambulatory Visit | Attending: Radiation Oncology | Admitting: Radiation Oncology

## 2021-09-12 ENCOUNTER — Ambulatory Visit: Payer: Medicare HMO

## 2021-09-12 ENCOUNTER — Other Ambulatory Visit: Payer: Self-pay

## 2021-09-12 DIAGNOSIS — R918 Other nonspecific abnormal finding of lung field: Secondary | ICD-10-CM | POA: Diagnosis not present

## 2021-09-12 DIAGNOSIS — C3491 Malignant neoplasm of unspecified part of right bronchus or lung: Secondary | ICD-10-CM | POA: Diagnosis not present

## 2021-09-12 DIAGNOSIS — Z87891 Personal history of nicotine dependence: Secondary | ICD-10-CM | POA: Diagnosis not present

## 2021-09-12 DIAGNOSIS — Z510 Encounter for antineoplastic radiation therapy: Secondary | ICD-10-CM | POA: Diagnosis not present

## 2021-09-12 DIAGNOSIS — C3411 Malignant neoplasm of upper lobe, right bronchus or lung: Secondary | ICD-10-CM | POA: Diagnosis not present

## 2021-09-12 LAB — RAD ONC ARIA SESSION SUMMARY
Course Elapsed Days: 32
Plan Fractions Treated to Date: 24
Plan Prescribed Dose Per Fraction: 2 Gy
Plan Total Fractions Prescribed: 30
Plan Total Prescribed Dose: 60 Gy
Reference Point Dosage Given to Date: 48 Gy
Reference Point Session Dosage Given: 2 Gy
Session Number: 24

## 2021-09-12 MED FILL — Dexamethasone Sodium Phosphate Inj 100 MG/10ML: INTRAMUSCULAR | Qty: 1 | Status: AC

## 2021-09-12 NOTE — Telephone Encounter (Signed)
Transferred call to Dr Lisbeth Renshaw 's nurse.

## 2021-09-15 ENCOUNTER — Telehealth: Payer: Self-pay | Admitting: *Deleted

## 2021-09-15 ENCOUNTER — Inpatient Hospital Stay (HOSPITAL_BASED_OUTPATIENT_CLINIC_OR_DEPARTMENT_OTHER): Payer: Medicare HMO | Admitting: Physician Assistant

## 2021-09-15 ENCOUNTER — Other Ambulatory Visit: Payer: Self-pay | Admitting: Radiation Oncology

## 2021-09-15 ENCOUNTER — Other Ambulatory Visit: Payer: Self-pay

## 2021-09-15 ENCOUNTER — Encounter: Payer: Self-pay | Admitting: Physician Assistant

## 2021-09-15 ENCOUNTER — Inpatient Hospital Stay: Payer: Medicare HMO | Attending: Internal Medicine

## 2021-09-15 ENCOUNTER — Ambulatory Visit
Admission: RE | Admit: 2021-09-15 | Discharge: 2021-09-15 | Disposition: A | Discharge status: Home / Self Care | Payer: Medicare HMO | Source: Ambulatory Visit | Attending: Radiation Oncology | Admitting: Radiation Oncology

## 2021-09-15 ENCOUNTER — Inpatient Hospital Stay: Payer: Medicare HMO

## 2021-09-15 ENCOUNTER — Telehealth: Payer: Self-pay

## 2021-09-15 VITALS — BP 103/54 | HR 78 | Temp 96.0°F | Resp 18 | Wt 168.1 lb

## 2021-09-15 DIAGNOSIS — Z951 Presence of aortocoronary bypass graft: Secondary | ICD-10-CM | POA: Diagnosis not present

## 2021-09-15 DIAGNOSIS — T451X5A Adverse effect of antineoplastic and immunosuppressive drugs, initial encounter: Secondary | ICD-10-CM | POA: Diagnosis not present

## 2021-09-15 DIAGNOSIS — E860 Dehydration: Secondary | ICD-10-CM

## 2021-09-15 DIAGNOSIS — K208 Other esophagitis without bleeding: Secondary | ICD-10-CM | POA: Diagnosis not present

## 2021-09-15 DIAGNOSIS — D709 Neutropenia, unspecified: Secondary | ICD-10-CM | POA: Insufficient documentation

## 2021-09-15 DIAGNOSIS — E785 Hyperlipidemia, unspecified: Secondary | ICD-10-CM | POA: Insufficient documentation

## 2021-09-15 DIAGNOSIS — R1310 Dysphagia, unspecified: Secondary | ICD-10-CM | POA: Insufficient documentation

## 2021-09-15 DIAGNOSIS — C3411 Malignant neoplasm of upper lobe, right bronchus or lung: Secondary | ICD-10-CM | POA: Insufficient documentation

## 2021-09-15 DIAGNOSIS — Z5189 Encounter for other specified aftercare: Secondary | ICD-10-CM | POA: Insufficient documentation

## 2021-09-15 DIAGNOSIS — Z7902 Long term (current) use of antithrombotics/antiplatelets: Secondary | ICD-10-CM | POA: Insufficient documentation

## 2021-09-15 DIAGNOSIS — Z79899 Other long term (current) drug therapy: Secondary | ICD-10-CM | POA: Insufficient documentation

## 2021-09-15 DIAGNOSIS — Z510 Encounter for antineoplastic radiation therapy: Secondary | ICD-10-CM | POA: Diagnosis not present

## 2021-09-15 DIAGNOSIS — Z7982 Long term (current) use of aspirin: Secondary | ICD-10-CM | POA: Insufficient documentation

## 2021-09-15 DIAGNOSIS — C3491 Malignant neoplasm of unspecified part of right bronchus or lung: Secondary | ICD-10-CM

## 2021-09-15 DIAGNOSIS — R630 Anorexia: Secondary | ICD-10-CM | POA: Diagnosis not present

## 2021-09-15 DIAGNOSIS — Z902 Acquired absence of lung [part of]: Secondary | ICD-10-CM | POA: Insufficient documentation

## 2021-09-15 DIAGNOSIS — D701 Agranulocytosis secondary to cancer chemotherapy: Secondary | ICD-10-CM | POA: Diagnosis not present

## 2021-09-15 DIAGNOSIS — Z87891 Personal history of nicotine dependence: Secondary | ICD-10-CM | POA: Diagnosis not present

## 2021-09-15 DIAGNOSIS — R634 Abnormal weight loss: Secondary | ICD-10-CM | POA: Diagnosis not present

## 2021-09-15 DIAGNOSIS — D702 Other drug-induced agranulocytosis: Secondary | ICD-10-CM

## 2021-09-15 DIAGNOSIS — C3490 Malignant neoplasm of unspecified part of unspecified bronchus or lung: Secondary | ICD-10-CM

## 2021-09-15 DIAGNOSIS — I2510 Atherosclerotic heart disease of native coronary artery without angina pectoris: Secondary | ICD-10-CM | POA: Insufficient documentation

## 2021-09-15 DIAGNOSIS — C771 Secondary and unspecified malignant neoplasm of intrathoracic lymph nodes: Secondary | ICD-10-CM | POA: Diagnosis not present

## 2021-09-15 DIAGNOSIS — R0602 Shortness of breath: Secondary | ICD-10-CM | POA: Diagnosis not present

## 2021-09-15 DIAGNOSIS — R918 Other nonspecific abnormal finding of lung field: Secondary | ICD-10-CM | POA: Diagnosis not present

## 2021-09-15 LAB — CMP (CANCER CENTER ONLY)
ALT: 7 U/L (ref 0–44)
AST: 10 U/L — ABNORMAL LOW (ref 15–41)
Albumin: 3.8 g/dL (ref 3.5–5.0)
Alkaline Phosphatase: 69 U/L (ref 38–126)
Anion gap: 7 (ref 5–15)
BUN: 24 mg/dL — ABNORMAL HIGH (ref 8–23)
CO2: 25 mmol/L (ref 22–32)
Calcium: 9.3 mg/dL (ref 8.9–10.3)
Chloride: 104 mmol/L (ref 98–111)
Creatinine: 1.22 mg/dL (ref 0.61–1.24)
GFR, Estimated: >60 mL/min (ref >60)
Glucose, Bld: 129 mg/dL — ABNORMAL HIGH (ref 70–99)
Potassium: 4 mmol/L (ref 3.5–5.1)
Sodium: 136 mmol/L (ref 135–145)
Total Bilirubin: 1.3 mg/dL — ABNORMAL HIGH (ref 0.3–1.2)
Total Protein: 6.6 g/dL (ref 6.5–8.1)

## 2021-09-15 LAB — CBC WITH DIFFERENTIAL (CANCER CENTER ONLY)
Abs Immature Granulocytes: 0.01 10*3/uL (ref 0.00–0.07)
Basophils Absolute: 0 10*3/uL (ref 0.0–0.1)
Basophils Relative: 1 %
Eosinophils Absolute: 0.1 10*3/uL (ref 0.0–0.5)
Eosinophils Relative: 8 %
HCT: 31.3 % — ABNORMAL LOW (ref 39.0–52.0)
Hemoglobin: 10.5 g/dL — ABNORMAL LOW (ref 13.0–17.0)
Immature Granulocytes: 1 %
Lymphocytes Relative: 24 %
Lymphs Abs: 0.2 10*3/uL — ABNORMAL LOW (ref 0.7–4.0)
MCH: 29.8 pg (ref 26.0–34.0)
MCHC: 33.5 g/dL (ref 30.0–36.0)
MCV: 88.9 fL (ref 80.0–100.0)
Monocytes Absolute: 0 10*3/uL — ABNORMAL LOW (ref 0.1–1.0)
Monocytes Relative: 5 %
Neutro Abs: 0.5 10*3/uL — ABNORMAL LOW (ref 1.7–7.7)
Neutrophils Relative %: 61 %
Platelet Count: 139 10*3/uL — ABNORMAL LOW (ref 150–400)
RBC: 3.52 MIL/uL — ABNORMAL LOW (ref 4.22–5.81)
RDW: 13.9 % (ref 11.5–15.5)
WBC Count: 0.8 10*3/uL — CL (ref 4.0–10.5)
nRBC: 0 % (ref 0.0–0.2)

## 2021-09-15 LAB — RAD ONC ARIA SESSION SUMMARY
Course Elapsed Days: 35
Plan Fractions Treated to Date: 25
Plan Prescribed Dose Per Fraction: 2 Gy
Plan Total Fractions Prescribed: 30
Plan Total Prescribed Dose: 60 Gy
Reference Point Dosage Given to Date: 50 Gy
Reference Point Session Dosage Given: 2 Gy
Session Number: 25

## 2021-09-15 MED ORDER — HYDROCODONE-ACETAMINOPHEN 7.5-325 MG/15ML PO SOLN: 10.0000 mL | Freq: Four times a day (QID) | ORAL | 0 refills | Status: DC | PRN

## 2021-09-15 MED ORDER — SODIUM CHLORIDE 0.9 % IV SOLN: Freq: Once | INTRAVENOUS | Status: AC

## 2021-09-15 MED ORDER — FILGRASTIM-SNDZ 480 MCG/0.8ML IJ SOSY
480.0000 ug | PREFILLED_SYRINGE | Freq: Once | INTRAMUSCULAR | Status: AC
  Administered 2021-09-15: 480 ug via SUBCUTANEOUS

## 2021-09-15 NOTE — Telephone Encounter (Signed)
Pt c/o ongoing throat pain and discomfort hindering his ability to eat.   He states he is using Lidocaine 2% and Carafate and this is not helping.  Pt is requesting another option.

## 2021-09-15 NOTE — Telephone Encounter (Signed)
Pt called stating the Hycet is not available at any [facility] with in [place] and request the rx be sent to [facility] and ask that it be redirected there.

## 2021-09-15 NOTE — Telephone Encounter (Signed)
Patient is currently receiving his chemo infusion.  I spoke with his nurse and she will relay medication changes to the patient.  We have sent in Hycet to his pharmacy and should be taken as prescribed, he should stop taking lidocaine solution.  Gloriajean Dell. Leonie Green, BSN

## 2021-09-15 NOTE — Patient Instructions (Signed)

## 2021-09-16 ENCOUNTER — Ambulatory Visit
Admission: RE | Admit: 2021-09-16 | Discharge: 2021-09-16 | Disposition: A | Discharge status: Home / Self Care | Payer: Medicare HMO | Source: Ambulatory Visit | Attending: Radiation Oncology | Admitting: Radiation Oncology

## 2021-09-16 ENCOUNTER — Other Ambulatory Visit: Payer: Self-pay

## 2021-09-16 ENCOUNTER — Inpatient Hospital Stay: Payer: Medicare HMO

## 2021-09-16 ENCOUNTER — Ambulatory Visit (HOSPITAL_COMMUNITY): Payer: Medicare HMO

## 2021-09-16 VITALS — BP 103/52 | HR 79 | Temp 97.9°F | Resp 18

## 2021-09-16 DIAGNOSIS — D701 Agranulocytosis secondary to cancer chemotherapy: Secondary | ICD-10-CM | POA: Diagnosis not present

## 2021-09-16 DIAGNOSIS — I2510 Atherosclerotic heart disease of native coronary artery without angina pectoris: Secondary | ICD-10-CM | POA: Diagnosis not present

## 2021-09-16 DIAGNOSIS — D702 Other drug-induced agranulocytosis: Secondary | ICD-10-CM

## 2021-09-16 DIAGNOSIS — C771 Secondary and unspecified malignant neoplasm of intrathoracic lymph nodes: Secondary | ICD-10-CM | POA: Diagnosis not present

## 2021-09-16 DIAGNOSIS — C3411 Malignant neoplasm of upper lobe, right bronchus or lung: Secondary | ICD-10-CM | POA: Diagnosis not present

## 2021-09-16 DIAGNOSIS — R0602 Shortness of breath: Secondary | ICD-10-CM | POA: Diagnosis not present

## 2021-09-16 DIAGNOSIS — R918 Other nonspecific abnormal finding of lung field: Secondary | ICD-10-CM | POA: Diagnosis not present

## 2021-09-16 DIAGNOSIS — R634 Abnormal weight loss: Secondary | ICD-10-CM | POA: Diagnosis not present

## 2021-09-16 DIAGNOSIS — Z87891 Personal history of nicotine dependence: Secondary | ICD-10-CM | POA: Diagnosis not present

## 2021-09-16 DIAGNOSIS — E785 Hyperlipidemia, unspecified: Secondary | ICD-10-CM | POA: Diagnosis not present

## 2021-09-16 DIAGNOSIS — R630 Anorexia: Secondary | ICD-10-CM | POA: Diagnosis not present

## 2021-09-16 DIAGNOSIS — Z5189 Encounter for other specified aftercare: Secondary | ICD-10-CM | POA: Diagnosis not present

## 2021-09-16 DIAGNOSIS — E860 Dehydration: Secondary | ICD-10-CM | POA: Diagnosis not present

## 2021-09-16 DIAGNOSIS — R1310 Dysphagia, unspecified: Secondary | ICD-10-CM | POA: Diagnosis not present

## 2021-09-16 DIAGNOSIS — Z7902 Long term (current) use of antithrombotics/antiplatelets: Secondary | ICD-10-CM | POA: Diagnosis not present

## 2021-09-16 DIAGNOSIS — C3491 Malignant neoplasm of unspecified part of right bronchus or lung: Secondary | ICD-10-CM | POA: Diagnosis not present

## 2021-09-16 DIAGNOSIS — Z951 Presence of aortocoronary bypass graft: Secondary | ICD-10-CM | POA: Diagnosis not present

## 2021-09-16 DIAGNOSIS — Z510 Encounter for antineoplastic radiation therapy: Secondary | ICD-10-CM | POA: Diagnosis not present

## 2021-09-16 DIAGNOSIS — Z7982 Long term (current) use of aspirin: Secondary | ICD-10-CM | POA: Diagnosis not present

## 2021-09-16 DIAGNOSIS — K208 Other esophagitis without bleeding: Secondary | ICD-10-CM | POA: Diagnosis not present

## 2021-09-16 DIAGNOSIS — Z79899 Other long term (current) drug therapy: Secondary | ICD-10-CM | POA: Diagnosis not present

## 2021-09-16 DIAGNOSIS — Z902 Acquired absence of lung [part of]: Secondary | ICD-10-CM | POA: Diagnosis not present

## 2021-09-16 LAB — RAD ONC ARIA SESSION SUMMARY
Course Elapsed Days: 36
Plan Fractions Treated to Date: 26
Plan Prescribed Dose Per Fraction: 2 Gy
Plan Total Fractions Prescribed: 30
Plan Total Prescribed Dose: 60 Gy
Reference Point Dosage Given to Date: 52 Gy
Reference Point Session Dosage Given: 2 Gy
Session Number: 26

## 2021-09-16 MED ORDER — FILGRASTIM-SNDZ 480 MCG/0.8ML IJ SOSY
480.0000 ug | PREFILLED_SYRINGE | Freq: Once | INTRAMUSCULAR | Status: AC
  Administered 2021-09-16: 480 ug via SUBCUTANEOUS
  Filled 2021-09-16: qty 0.8

## 2021-09-17 ENCOUNTER — Ambulatory Visit
Admission: RE | Admit: 2021-09-17 | Discharge: 2021-09-17 | Disposition: A | Discharge status: Home / Self Care | Payer: Medicare HMO | Source: Ambulatory Visit | Attending: Radiation Oncology | Admitting: Radiation Oncology

## 2021-09-17 ENCOUNTER — Telehealth: Payer: Self-pay | Admitting: *Deleted

## 2021-09-17 ENCOUNTER — Other Ambulatory Visit: Payer: Self-pay

## 2021-09-17 ENCOUNTER — Inpatient Hospital Stay: Payer: Medicare HMO

## 2021-09-17 ENCOUNTER — Other Ambulatory Visit: Payer: Self-pay | Admitting: Physician Assistant

## 2021-09-17 ENCOUNTER — Other Ambulatory Visit: Payer: Self-pay | Admitting: Radiation Oncology

## 2021-09-17 VITALS — BP 104/44 | HR 79 | Temp 97.7°F | Resp 18

## 2021-09-17 DIAGNOSIS — Z299 Encounter for prophylactic measures, unspecified: Secondary | ICD-10-CM | POA: Diagnosis not present

## 2021-09-17 DIAGNOSIS — C3491 Malignant neoplasm of unspecified part of right bronchus or lung: Secondary | ICD-10-CM | POA: Diagnosis not present

## 2021-09-17 DIAGNOSIS — Z7689 Persons encountering health services in other specified circumstances: Secondary | ICD-10-CM | POA: Diagnosis not present

## 2021-09-17 DIAGNOSIS — Z7982 Long term (current) use of aspirin: Secondary | ICD-10-CM | POA: Diagnosis not present

## 2021-09-17 DIAGNOSIS — Z87891 Personal history of nicotine dependence: Secondary | ICD-10-CM | POA: Diagnosis not present

## 2021-09-17 DIAGNOSIS — E860 Dehydration: Secondary | ICD-10-CM | POA: Diagnosis not present

## 2021-09-17 DIAGNOSIS — Z5189 Encounter for other specified aftercare: Secondary | ICD-10-CM | POA: Diagnosis not present

## 2021-09-17 DIAGNOSIS — Z902 Acquired absence of lung [part of]: Secondary | ICD-10-CM | POA: Diagnosis not present

## 2021-09-17 DIAGNOSIS — R634 Abnormal weight loss: Secondary | ICD-10-CM | POA: Diagnosis not present

## 2021-09-17 DIAGNOSIS — I2510 Atherosclerotic heart disease of native coronary artery without angina pectoris: Secondary | ICD-10-CM | POA: Diagnosis not present

## 2021-09-17 DIAGNOSIS — R0602 Shortness of breath: Secondary | ICD-10-CM | POA: Diagnosis not present

## 2021-09-17 DIAGNOSIS — Z510 Encounter for antineoplastic radiation therapy: Secondary | ICD-10-CM | POA: Diagnosis not present

## 2021-09-17 DIAGNOSIS — R918 Other nonspecific abnormal finding of lung field: Secondary | ICD-10-CM | POA: Diagnosis not present

## 2021-09-17 DIAGNOSIS — Z79899 Other long term (current) drug therapy: Secondary | ICD-10-CM | POA: Diagnosis not present

## 2021-09-17 DIAGNOSIS — D702 Other drug-induced agranulocytosis: Secondary | ICD-10-CM

## 2021-09-17 DIAGNOSIS — Z7902 Long term (current) use of antithrombotics/antiplatelets: Secondary | ICD-10-CM | POA: Diagnosis not present

## 2021-09-17 DIAGNOSIS — R630 Anorexia: Secondary | ICD-10-CM | POA: Diagnosis not present

## 2021-09-17 DIAGNOSIS — K208 Other esophagitis without bleeding: Secondary | ICD-10-CM | POA: Diagnosis not present

## 2021-09-17 DIAGNOSIS — C771 Secondary and unspecified malignant neoplasm of intrathoracic lymph nodes: Secondary | ICD-10-CM | POA: Diagnosis not present

## 2021-09-17 DIAGNOSIS — C3411 Malignant neoplasm of upper lobe, right bronchus or lung: Secondary | ICD-10-CM | POA: Diagnosis not present

## 2021-09-17 DIAGNOSIS — D701 Agranulocytosis secondary to cancer chemotherapy: Secondary | ICD-10-CM | POA: Diagnosis not present

## 2021-09-17 DIAGNOSIS — Z789 Other specified health status: Secondary | ICD-10-CM | POA: Diagnosis not present

## 2021-09-17 DIAGNOSIS — E785 Hyperlipidemia, unspecified: Secondary | ICD-10-CM | POA: Diagnosis not present

## 2021-09-17 DIAGNOSIS — R1310 Dysphagia, unspecified: Secondary | ICD-10-CM | POA: Diagnosis not present

## 2021-09-17 DIAGNOSIS — Z951 Presence of aortocoronary bypass graft: Secondary | ICD-10-CM | POA: Diagnosis not present

## 2021-09-17 LAB — RAD ONC ARIA SESSION SUMMARY
Course Elapsed Days: 37
Plan Fractions Treated to Date: 27
Plan Prescribed Dose Per Fraction: 2 Gy
Plan Total Fractions Prescribed: 30
Plan Total Prescribed Dose: 60 Gy
Reference Point Dosage Given to Date: 54 Gy
Reference Point Session Dosage Given: 2 Gy
Session Number: 27

## 2021-09-17 MED ORDER — FILGRASTIM-SNDZ 480 MCG/0.8ML IJ SOSY
480.0000 ug | PREFILLED_SYRINGE | Freq: Once | INTRAMUSCULAR | Status: AC
  Administered 2021-09-17: 480 ug via SUBCUTANEOUS
  Filled 2021-09-17: qty 0.8

## 2021-09-17 MED ORDER — OXYCODONE HCL 5 MG PO TABS: 5.0000 mg | ORAL_TABLET | Freq: Four times a day (QID) | ORAL | 0 refills | Status: DC | PRN

## 2021-09-17 NOTE — Telephone Encounter (Signed)
Spoke with the patients wife regarding concerns he is having with his throat.  She reports Iosefa cannot tolerate the Hycet that was prescribed for him a couple of days ago.  She states the medicine is to "strong" for him to swallow.  He is requesting pain medication in tablet form.  MD made aware and will send prescription to [facility].   Gloriajean Dell. Leonie Green, BSN

## 2021-09-18 ENCOUNTER — Other Ambulatory Visit: Payer: Self-pay

## 2021-09-18 ENCOUNTER — Ambulatory Visit
Admission: RE | Admit: 2021-09-18 | Discharge: 2021-09-18 | Disposition: A | Discharge status: Home / Self Care | Payer: Medicare HMO | Source: Ambulatory Visit | Attending: Radiation Oncology | Admitting: Radiation Oncology

## 2021-09-18 ENCOUNTER — Ambulatory Visit: Payer: Medicare HMO | Admitting: Internal Medicine

## 2021-09-18 DIAGNOSIS — Z87891 Personal history of nicotine dependence: Secondary | ICD-10-CM | POA: Diagnosis not present

## 2021-09-18 DIAGNOSIS — Z510 Encounter for antineoplastic radiation therapy: Secondary | ICD-10-CM | POA: Diagnosis not present

## 2021-09-18 DIAGNOSIS — R918 Other nonspecific abnormal finding of lung field: Secondary | ICD-10-CM | POA: Diagnosis not present

## 2021-09-18 DIAGNOSIS — C3491 Malignant neoplasm of unspecified part of right bronchus or lung: Secondary | ICD-10-CM | POA: Diagnosis not present

## 2021-09-18 DIAGNOSIS — C3411 Malignant neoplasm of upper lobe, right bronchus or lung: Secondary | ICD-10-CM | POA: Diagnosis not present

## 2021-09-18 LAB — RAD ONC ARIA SESSION SUMMARY
Course Elapsed Days: 38
Plan Fractions Treated to Date: 28
Plan Prescribed Dose Per Fraction: 2 Gy
Plan Total Fractions Prescribed: 30
Plan Total Prescribed Dose: 60 Gy
Reference Point Dosage Given to Date: 56 Gy
Reference Point Session Dosage Given: 2 Gy
Session Number: 28

## 2021-09-19 ENCOUNTER — Ambulatory Visit
Admission: RE | Admit: 2021-09-19 | Discharge: 2021-09-19 | Disposition: A | Discharge status: Home / Self Care | Payer: Medicare HMO | Source: Ambulatory Visit | Attending: Radiation Oncology | Admitting: Radiation Oncology

## 2021-09-19 ENCOUNTER — Other Ambulatory Visit: Payer: Self-pay

## 2021-09-19 ENCOUNTER — Encounter: Payer: Self-pay | Admitting: *Deleted

## 2021-09-19 DIAGNOSIS — Z87891 Personal history of nicotine dependence: Secondary | ICD-10-CM | POA: Diagnosis not present

## 2021-09-19 DIAGNOSIS — R918 Other nonspecific abnormal finding of lung field: Secondary | ICD-10-CM | POA: Diagnosis not present

## 2021-09-19 DIAGNOSIS — C3411 Malignant neoplasm of upper lobe, right bronchus or lung: Secondary | ICD-10-CM | POA: Diagnosis not present

## 2021-09-19 DIAGNOSIS — C3491 Malignant neoplasm of unspecified part of right bronchus or lung: Secondary | ICD-10-CM | POA: Diagnosis not present

## 2021-09-19 DIAGNOSIS — Z510 Encounter for antineoplastic radiation therapy: Secondary | ICD-10-CM | POA: Diagnosis not present

## 2021-09-19 LAB — RAD ONC ARIA SESSION SUMMARY
Course Elapsed Days: 39
Plan Fractions Treated to Date: 29
Plan Prescribed Dose Per Fraction: 2 Gy
Plan Total Fractions Prescribed: 30
Plan Total Prescribed Dose: 60 Gy
Reference Point Dosage Given to Date: 58 Gy
Reference Point Session Dosage Given: 2 Gy
Session Number: 29

## 2021-09-19 MED FILL — Dexamethasone Sodium Phosphate Inj 100 MG/10ML: INTRAMUSCULAR | Qty: 1 | Status: AC

## 2021-09-19 NOTE — Progress Notes (Signed)
Oncology Nurse Navigator Documentation I followed up on tx plan. Patient is currently scheduled for treatment plan.      09/19/2021   10:00 AM 07/31/2021   10:00 AM 07/30/2021    2:00 PM 07/28/2021    3:00 PM 07/28/2021    9:00 AM 07/25/2021    1:00 PM 07/18/2021   11:00 AM  Oncology Nurse Navigator Flowsheets  Diagnosis Status Confirmed Diagnosis Complete        Planned Course of Treatment Chemo/Radiation Concurrent        Phase of Treatment Radiation        Chemotherapy Actual Start Date: 08/12/2021        Radiation Actual Start Date: 08/11/2021        Navigator Follow Up Date: 09/30/2021 08/18/2021 08/04/2021 07/30/2021  07/28/2021   Navigator Follow Up Reason: Follow-up Appointment Follow-up Appointment Appointment Review Follow-up Appointment  Appointment Review Pathology  Navigator Location [facility]  Navigator Encounter Type Appt/Treatment Plan Review Appt/Treatment Plan Review Clinic/MDC;Education Follow-up Appt Molecular Studies Appt/Treatment Plan Review Pathology Review  Treatment Initiated Date 08/11/2021        Patient Visit Type   MedOnc;Follow-up      Treatment Phase Treatment Pre-Tx/Tx Discussion Pre-Tx/Tx Discussion Pre-Tx/Tx Discussion Pre-Tx/Tx Discussion    Barriers/Navigation Needs Coordination of Care Coordination of Care Education;Coordination of Care Coordination of Care Coordination of Care Coordination of Care Coordination of Care  Education   Newly Diagnosed Cancer Education;Preparing for Upcoming Surgery/ Treatment;Other      Interventions Coordination of Care Coordination of Care Coordination of Care;Education;Psycho-Social Support;Referrals Coordination of Care Coordination of Care Coordination of Care Coordination of Care  Acuity Level 2-Minimal Needs (1-2 Barriers Identified) Level 2-Minimal Needs (1-2 Barriers Identified) Level 3-Moderate Needs (3-4 Barriers Identified)  Level 2-Minimal Needs (1-2 Barriers Identified) Level 2-Minimal Needs (1-2 Barriers Identified) Level 2-Minimal Needs (1-2 Barriers Identified) Level 2-Minimal Needs (1-2 Barriers Identified)  Referrals   Radiation      Coordination of Care Other Other Other Other Pathology Other Pathology;Other  Education Method   Verbal;Other      Time Spent with Patient 30 30 45 30 30 30  30

## 2021-09-22 ENCOUNTER — Inpatient Hospital Stay: Payer: Medicare HMO

## 2021-09-22 ENCOUNTER — Other Ambulatory Visit: Payer: Self-pay

## 2021-09-22 ENCOUNTER — Ambulatory Visit
Admission: RE | Admit: 2021-09-22 | Discharge: 2021-09-22 | Disposition: A | Discharge status: Home / Self Care | Payer: Medicare HMO | Source: Ambulatory Visit | Attending: Radiation Oncology | Admitting: Radiation Oncology

## 2021-09-22 VITALS — BP 110/55 | HR 70 | Temp 97.2°F

## 2021-09-22 DIAGNOSIS — D701 Agranulocytosis secondary to cancer chemotherapy: Secondary | ICD-10-CM | POA: Diagnosis not present

## 2021-09-22 DIAGNOSIS — C3491 Malignant neoplasm of unspecified part of right bronchus or lung: Secondary | ICD-10-CM | POA: Diagnosis not present

## 2021-09-22 DIAGNOSIS — C771 Secondary and unspecified malignant neoplasm of intrathoracic lymph nodes: Secondary | ICD-10-CM | POA: Diagnosis not present

## 2021-09-22 DIAGNOSIS — K208 Other esophagitis without bleeding: Secondary | ICD-10-CM | POA: Diagnosis not present

## 2021-09-22 DIAGNOSIS — Z7982 Long term (current) use of aspirin: Secondary | ICD-10-CM | POA: Diagnosis not present

## 2021-09-22 DIAGNOSIS — Z79899 Other long term (current) drug therapy: Secondary | ICD-10-CM | POA: Diagnosis not present

## 2021-09-22 DIAGNOSIS — R1310 Dysphagia, unspecified: Secondary | ICD-10-CM | POA: Diagnosis not present

## 2021-09-22 DIAGNOSIS — R918 Other nonspecific abnormal finding of lung field: Secondary | ICD-10-CM | POA: Diagnosis not present

## 2021-09-22 DIAGNOSIS — Z5189 Encounter for other specified aftercare: Secondary | ICD-10-CM | POA: Diagnosis not present

## 2021-09-22 DIAGNOSIS — E860 Dehydration: Secondary | ICD-10-CM | POA: Diagnosis not present

## 2021-09-22 DIAGNOSIS — R0602 Shortness of breath: Secondary | ICD-10-CM | POA: Diagnosis not present

## 2021-09-22 DIAGNOSIS — Z87891 Personal history of nicotine dependence: Secondary | ICD-10-CM | POA: Diagnosis not present

## 2021-09-22 DIAGNOSIS — Z902 Acquired absence of lung [part of]: Secondary | ICD-10-CM | POA: Diagnosis not present

## 2021-09-22 DIAGNOSIS — I2510 Atherosclerotic heart disease of native coronary artery without angina pectoris: Secondary | ICD-10-CM | POA: Diagnosis not present

## 2021-09-22 DIAGNOSIS — Z951 Presence of aortocoronary bypass graft: Secondary | ICD-10-CM | POA: Diagnosis not present

## 2021-09-22 DIAGNOSIS — Z510 Encounter for antineoplastic radiation therapy: Secondary | ICD-10-CM | POA: Diagnosis not present

## 2021-09-22 DIAGNOSIS — R634 Abnormal weight loss: Secondary | ICD-10-CM | POA: Diagnosis not present

## 2021-09-22 DIAGNOSIS — R630 Anorexia: Secondary | ICD-10-CM | POA: Diagnosis not present

## 2021-09-22 DIAGNOSIS — E785 Hyperlipidemia, unspecified: Secondary | ICD-10-CM | POA: Diagnosis not present

## 2021-09-22 DIAGNOSIS — C3411 Malignant neoplasm of upper lobe, right bronchus or lung: Secondary | ICD-10-CM | POA: Diagnosis not present

## 2021-09-22 DIAGNOSIS — Z7902 Long term (current) use of antithrombotics/antiplatelets: Secondary | ICD-10-CM | POA: Diagnosis not present

## 2021-09-22 LAB — CMP (CANCER CENTER ONLY)
ALT: 9 U/L (ref 0–44)
AST: 12 U/L — ABNORMAL LOW (ref 15–41)
Albumin: 3.7 g/dL (ref 3.5–5.0)
Alkaline Phosphatase: 74 U/L (ref 38–126)
Anion gap: 4 — ABNORMAL LOW (ref 5–15)
BUN: 16 mg/dL (ref 8–23)
CO2: 30 mmol/L (ref 22–32)
Calcium: 9.2 mg/dL (ref 8.9–10.3)
Chloride: 104 mmol/L (ref 98–111)
Creatinine: 1.25 mg/dL — ABNORMAL HIGH (ref 0.61–1.24)
GFR, Estimated: 59 mL/min — ABNORMAL LOW (ref >60)
Glucose, Bld: 103 mg/dL — ABNORMAL HIGH (ref 70–99)
Potassium: 3.5 mmol/L (ref 3.5–5.1)
Sodium: 138 mmol/L (ref 135–145)
Total Bilirubin: 0.8 mg/dL (ref 0.3–1.2)
Total Protein: 6.4 g/dL — ABNORMAL LOW (ref 6.5–8.1)

## 2021-09-22 LAB — CBC WITH DIFFERENTIAL (CANCER CENTER ONLY)
Abs Immature Granulocytes: 0.13 10*3/uL — ABNORMAL HIGH (ref 0.00–0.07)
Basophils Absolute: 0 10*3/uL (ref 0.0–0.1)
Basophils Relative: 1 %
Eosinophils Absolute: 0 10*3/uL (ref 0.0–0.5)
Eosinophils Relative: 0 %
HCT: 30.3 % — ABNORMAL LOW (ref 39.0–52.0)
Hemoglobin: 10.2 g/dL — ABNORMAL LOW (ref 13.0–17.0)
Immature Granulocytes: 2 %
Lymphocytes Relative: 8 %
Lymphs Abs: 0.5 10*3/uL — ABNORMAL LOW (ref 0.7–4.0)
MCH: 30.4 pg (ref 26.0–34.0)
MCHC: 33.7 g/dL (ref 30.0–36.0)
MCV: 90.2 fL (ref 80.0–100.0)
Monocytes Absolute: 0.8 10*3/uL (ref 0.1–1.0)
Monocytes Relative: 15 %
Neutro Abs: 4.1 10*3/uL (ref 1.7–7.7)
Neutrophils Relative %: 74 %
Platelet Count: 143 10*3/uL — ABNORMAL LOW (ref 150–400)
RBC: 3.36 MIL/uL — ABNORMAL LOW (ref 4.22–5.81)
RDW: 16.3 % — ABNORMAL HIGH (ref 11.5–15.5)
WBC Count: 5.5 10*3/uL (ref 4.0–10.5)
nRBC: 0 % (ref 0.0–0.2)

## 2021-09-22 LAB — RAD ONC ARIA SESSION SUMMARY
Course Elapsed Days: 42
Plan Fractions Treated to Date: 30
Plan Prescribed Dose Per Fraction: 2 Gy
Plan Total Fractions Prescribed: 30
Plan Total Prescribed Dose: 60 Gy
Reference Point Dosage Given to Date: 60 Gy
Reference Point Session Dosage Given: 2 Gy
Session Number: 30

## 2021-09-22 MED ORDER — SODIUM CHLORIDE 0.9 % IV SOLN
45.0000 mg/m2 | Freq: Once | INTRAVENOUS | Status: AC
  Administered 2021-09-22: 90 mg via INTRAVENOUS
  Filled 2021-09-22: qty 15

## 2021-09-22 MED ORDER — DIPHENHYDRAMINE HCL 50 MG/ML IJ SOLN
25.0000 mg | Freq: Once | INTRAMUSCULAR | Status: AC
  Administered 2021-09-22: 25 mg via INTRAVENOUS
  Filled 2021-09-22: qty 1

## 2021-09-22 MED ORDER — SODIUM CHLORIDE 0.9 % IV SOLN
10.0000 mg | Freq: Once | INTRAVENOUS | Status: AC
  Administered 2021-09-22: 10 mg via INTRAVENOUS
  Filled 2021-09-22: qty 10

## 2021-09-22 MED ORDER — SODIUM CHLORIDE 0.9 % IV SOLN: Freq: Once | INTRAVENOUS | Status: AC

## 2021-09-22 MED ORDER — FAMOTIDINE IN NACL 20-0.9 MG/50ML-% IV SOLN
20.0000 mg | Freq: Once | INTRAVENOUS | Status: AC
  Administered 2021-09-22: 20 mg via INTRAVENOUS
  Filled 2021-09-22: qty 50

## 2021-09-22 MED ORDER — PALONOSETRON HCL INJECTION 0.25 MG/5ML
0.2500 mg | Freq: Once | INTRAVENOUS | Status: AC
  Administered 2021-09-22: 0.25 mg via INTRAVENOUS
  Filled 2021-09-22: qty 5

## 2021-09-22 MED ORDER — SODIUM CHLORIDE 0.9 % IV SOLN
168.6000 mg | Freq: Once | INTRAVENOUS | Status: AC
  Administered 2021-09-22: 170 mg via INTRAVENOUS
  Filled 2021-09-22: qty 17

## 2021-09-22 NOTE — Progress Notes (Signed)
Carboplatin dose slightly outside of 10%. Will continue Carboplatin 170 mg and will evaluate future doses.  Larene Beach, PharmD

## 2021-09-22 NOTE — Patient Instructions (Addendum)
[facility]  Discharge Instructions: Thank you for choosing [facility] to provide your oncology and hematology care.   If you have a lab appointment with the Cancer Center, please go directly to the Cancer Center and check in at the registration area.   Wear comfortable clothing and clothing appropriate for easy access to any Portacath or PICC line.   We strive to give you quality time with your provider. You may need to reschedule your appointment if you arrive late (15 or more minutes).  Arriving late affects you and other patients whose appointments are after yours.  Also, if you miss three or more appointments without notifying the office, you may be dismissed from the clinic at the provider's discretion.      For prescription refill requests, have your pharmacy contact our office and allow 72 hours for refills to be completed.    Today you received the following chemotherapy and/or immunotherapy agents: Taxol & Carboplatin   To help prevent nausea and vomiting after your treatment, we encourage you to take your nausea medication as directed.  BELOW ARE SYMPTOMS THAT SHOULD BE REPORTED IMMEDIATELY: *FEVER GREATER THAN 100.4 F (38 C) OR HIGHER *CHILLS OR SWEATING *NAUSEA AND VOMITING THAT IS NOT CONTROLLED WITH YOUR NAUSEA MEDICATION *UNUSUAL SHORTNESS OF BREATH *UNUSUAL BRUISING OR BLEEDING *URINARY PROBLEMS (pain or burning when urinating, or frequent urination) *BOWEL PROBLEMS (unusual diarrhea, constipation, pain near the anus) TENDERNESS IN MOUTH AND THROAT WITH OR WITHOUT PRESENCE OF ULCERS (sore throat, sores in mouth, or a toothache) UNUSUAL RASH, SWELLING OR PAIN  UNUSUAL VAGINAL DISCHARGE OR ITCHING   Items with * indicate a potential emergency and should be followed up as soon as possible or go to the Emergency Department if any problems should occur.  Please show the CHEMOTHERAPY ALERT CARD or IMMUNOTHERAPY ALERT CARD at  check-in to the Emergency Department and triage nurse.  Should you have questions after your visit or need to cancel or reschedule your appointment, please contact [facility]  Dept: [phone]  and follow the prompts.  Office hours are 8:00 a.m. to 4:30 p.m. Monday - Friday. Please note that voicemails left after 4:00 p.m. may not be returned until the following business day.  We are closed weekends and major holidays. You have access to a nurse at all times for urgent questions. Please call the main number to the clinic Dept: [phone] and follow the prompts.   For any non-urgent questions, you may also contact your provider using MyChart. We now offer e-Visits for anyone 18 and older to request care online for non-urgent symptoms. For details visit [email or web address].   Also download the MyChart app! Go to the app store, search "MyChart", open the app, select [facility], and log in with your MyChart username and password.  Due to Covid, a mask is required upon entering the hospital/clinic. If you do not have a mask, one will be given to you upon arrival. For doctor visits, patients may have 1 support person aged 18 or older with them. For treatment visits, patients cannot have anyone with them due to current Covid guidelines and our immunocompromised population.   

## 2021-09-23 ENCOUNTER — Other Ambulatory Visit: Payer: Self-pay

## 2021-09-23 ENCOUNTER — Ambulatory Visit
Admission: RE | Admit: 2021-09-23 | Discharge: 2021-09-23 | Disposition: A | Discharge status: Home / Self Care | Payer: Medicare HMO | Source: Ambulatory Visit | Attending: Radiation Oncology | Admitting: Radiation Oncology

## 2021-09-23 DIAGNOSIS — C3491 Malignant neoplasm of unspecified part of right bronchus or lung: Secondary | ICD-10-CM | POA: Diagnosis not present

## 2021-09-23 DIAGNOSIS — Z87891 Personal history of nicotine dependence: Secondary | ICD-10-CM | POA: Diagnosis not present

## 2021-09-23 DIAGNOSIS — R918 Other nonspecific abnormal finding of lung field: Secondary | ICD-10-CM | POA: Diagnosis not present

## 2021-09-23 DIAGNOSIS — Z510 Encounter for antineoplastic radiation therapy: Secondary | ICD-10-CM | POA: Diagnosis not present

## 2021-09-23 DIAGNOSIS — C3411 Malignant neoplasm of upper lobe, right bronchus or lung: Secondary | ICD-10-CM | POA: Diagnosis not present

## 2021-09-23 LAB — RAD ONC ARIA SESSION SUMMARY
Course Elapsed Days: 43
Plan Fractions Treated to Date: 1
Plan Prescribed Dose Per Fraction: 2 Gy
Plan Total Fractions Prescribed: 3
Plan Total Prescribed Dose: 6 Gy
Reference Point Dosage Given to Date: 62 Gy
Reference Point Session Dosage Given: 2 Gy
Session Number: 31

## 2021-09-24 ENCOUNTER — Other Ambulatory Visit: Payer: Self-pay

## 2021-09-24 ENCOUNTER — Ambulatory Visit
Admission: RE | Admit: 2021-09-24 | Discharge: 2021-09-24 | Disposition: A | Discharge status: Home / Self Care | Payer: Medicare HMO | Source: Ambulatory Visit | Attending: Radiation Oncology | Admitting: Radiation Oncology

## 2021-09-24 DIAGNOSIS — R918 Other nonspecific abnormal finding of lung field: Secondary | ICD-10-CM | POA: Diagnosis not present

## 2021-09-24 DIAGNOSIS — C3491 Malignant neoplasm of unspecified part of right bronchus or lung: Secondary | ICD-10-CM | POA: Diagnosis not present

## 2021-09-24 LAB — RAD ONC ARIA SESSION SUMMARY
Course Elapsed Days: 44
Plan Fractions Treated to Date: 2
Plan Prescribed Dose Per Fraction: 2 Gy
Plan Total Fractions Prescribed: 3
Plan Total Prescribed Dose: 6 Gy
Reference Point Dosage Given to Date: 64 Gy
Reference Point Session Dosage Given: 2 Gy
Session Number: 32

## 2021-09-25 ENCOUNTER — Ambulatory Visit
Admission: RE | Admit: 2021-09-25 | Discharge: 2021-09-25 | Disposition: A | Discharge status: Home / Self Care | Payer: Medicare HMO | Source: Ambulatory Visit | Attending: Radiation Oncology | Admitting: Radiation Oncology

## 2021-09-25 ENCOUNTER — Other Ambulatory Visit: Payer: Self-pay

## 2021-09-25 ENCOUNTER — Encounter: Payer: Self-pay | Admitting: Radiation Oncology

## 2021-09-25 DIAGNOSIS — C3491 Malignant neoplasm of unspecified part of right bronchus or lung: Secondary | ICD-10-CM | POA: Diagnosis not present

## 2021-09-25 DIAGNOSIS — R918 Other nonspecific abnormal finding of lung field: Secondary | ICD-10-CM | POA: Diagnosis not present

## 2021-09-25 DIAGNOSIS — Z510 Encounter for antineoplastic radiation therapy: Secondary | ICD-10-CM | POA: Diagnosis not present

## 2021-09-25 DIAGNOSIS — C3411 Malignant neoplasm of upper lobe, right bronchus or lung: Secondary | ICD-10-CM | POA: Diagnosis not present

## 2021-09-25 DIAGNOSIS — Z87891 Personal history of nicotine dependence: Secondary | ICD-10-CM | POA: Diagnosis not present

## 2021-09-25 LAB — RAD ONC ARIA SESSION SUMMARY
Course Elapsed Days: 45
Plan Fractions Treated to Date: 3
Plan Prescribed Dose Per Fraction: 2 Gy
Plan Total Fractions Prescribed: 3
Plan Total Prescribed Dose: 6 Gy
Reference Point Dosage Given to Date: 66 Gy
Reference Point Session Dosage Given: 2 Gy
Session Number: 33

## 2021-09-25 NOTE — Progress Notes (Deleted)
[facility] OFFICE PROGRESS NOTE  [name], MD [place] [address]  DIAGNOSIS: ***  PRIOR THERAPY:  CURRENT THERAPY:  INTERVAL HISTORY: Kevin [name] 81 y.o. male returns for *** regular *** visit for followup of ***   MEDICAL HISTORY: Past Medical History:  Diagnosis Date   Arthritis    Coronary artery disease    Hyperlipidemia     ALLERGIES:  has No Known Allergies.  MEDICATIONS:  Current Outpatient Medications  Medication Sig Dispense Refill   albuterol (VENTOLIN HFA) 108 (90 Base) MCG/ACT inhaler Inhale 1-2 puffs into the lungs every 6 (six) hours as needed for wheezing or shortness of breath. 18 g 3   aspirin EC 81 MG tablet Take 81 mg by mouth daily.     atorvastatin (LIPITOR) 80 MG tablet Take 1 tablet (80 mg total) by mouth daily. 90 tablet 3   Calcium Carb-Cholecalciferol (CALCIUM 600 + D PO) Take 1,200 mg by mouth daily.     Cholecalciferol (DIALYVITE VITAMIN D 5000) 125 MCG (5000 UT) capsule Take 5,000 Units by mouth daily.     clopidogrel (PLAVIX) 75 MG tablet Take 1 tablet (75 mg total) by mouth daily. Okay to restart this medication on 07/23/2021 90 tablet 2   Fluticasone-Umeclidin-Vilant (TRELEGY ELLIPTA) 100-62.5-25 MCG/ACT AEPB Inhale 1 puff into the lungs daily. (Patient not taking: Reported on 09/15/2021) 60 each 0   lidocaine (XYLOCAINE) 2 % solution Use as directed 10 mLs in the mouth or throat every 4 (four) hours as needed. 450 mL 1   metoprolol tartrate (LOPRESSOR) 25 MG tablet Take 1 tablet (25 mg total) by mouth 2 (two) times daily. 180 tablet 3   nitroGLYCERIN (NITROSTAT) 0.4 MG SL tablet Place 0.4 mg under the tongue every 5 (five) minutes as needed for chest pain.     oxyCODONE (OXY IR/ROXICODONE) 5 MG immediate release tablet Take 1-2 tablets (5-10 mg total) by mouth every 6 (six) hours as needed for severe pain. 90 tablet 0   prochlorperazine (COMPAZINE) 10 MG tablet Take 1 tablet (10 mg total) by mouth every 6 (six)  hours as needed for nausea or vomiting. 30 tablet 0   sucralfate (CARAFATE) 1 GM/10ML suspension Take 1 g by mouth 4 (four) times daily.     No current facility-administered medications for this visit.    SURGICAL HISTORY:  Past Surgical History:  Procedure Laterality Date   BRONCHIAL BIOPSY  07/22/2021   Procedure: BRONCHIAL BIOPSIES;  Surgeon: Collene Gobble, MD;  Location: [facility];  Service: Cardiopulmonary;;   BRONCHIAL BRUSHINGS  07/22/2021   Procedure: BRONCHIAL BRUSHINGS;  Surgeon: Collene Gobble, MD;  Location: [facility];  Service: Cardiopulmonary;;   BRONCHIAL NEEDLE ASPIRATION BIOPSY  07/22/2021   Procedure: BRONCHIAL NEEDLE ASPIRATION BIOPSIES;  Surgeon: Collene Gobble, MD;  Location: [facility];  Service: Cardiopulmonary;;   CORONARY ARTERY BYPASS GRAFT N/A 05/13/2021   Procedure: CORONARY ARTERY BYPASS GRAFTING (CABG) TIMES 3  , ON PUMP, USING LEFT INTERNAL MAMMARY ARTERY AND RIGHT GREATER SAPHENOUS VEIN HARVESTED ENDOSCOPICALLY;  Surgeon: Lajuana Matte, MD;  Location: [facility];  Service: Open Heart Surgery;  Laterality: N/A;   ENDOVEIN HARVEST OF GREATER SAPHENOUS VEIN Right 05/13/2021   Procedure: ENDOVEIN HARVEST OF GREATER SAPHENOUS VEIN;  Surgeon: Lajuana Matte, MD;  Location: [facility];  Service: Open Heart Surgery;  Laterality: Right;   epidural     back pain   IR CT SPINE LTD  06/18/2020   IR KYPHO  LUMBAR INC FX REDUCE BONE BX UNI/BIL CANNULATION INC/IMAGING  06/18/2020   LEFT HEART CATH AND CORONARY ANGIOGRAPHY N/A 05/06/2021   Procedure: LEFT HEART CATH AND CORONARY ANGIOGRAPHY;  Surgeon: [name], MD;  Location: [facility];  Service: Cardiovascular;  Laterality: N/A;   LUNG BIOPSY Right 05/13/2021   Procedure: RIGHT LUNG BIOPSY;  Surgeon: Lajuana Matte, MD;  Location: [facility];  Service: Open Heart Surgery;  Laterality: Right;   MINOR HEMORRHOIDECTOMY     SHOULDER SURGERY     TEE WITHOUT CARDIOVERSION N/A 05/13/2021   Procedure:  TRANSESOPHAGEAL ECHOCARDIOGRAM (TEE);  Surgeon: Lajuana Matte, MD;  Location: [facility];  Service: Open Heart Surgery;  Laterality: N/A;   TRANSURETHRAL RESECTION OF BLADDER TUMOR  2014   TRANSURETHRAL RESECTION OF BLADDER TUMOR N/A 05/19/2018   Procedure: TRANSURETHRAL RESECTION OF BLADDER TUMOR (TURBT) AND (INTRAVESICAL GEMCITABINE INSTILLED IN BLADDER IN PACU) ;  Surgeon: Franchot Gallo, MD;  Location: [facility];  Service: Urology;  Laterality: N/A;  [place] Bilateral 07/22/2021   Procedure: VIDEO BRONCHOSCOPY WITH ENDOBRONCHIAL ULTRASOUND;  Surgeon: Collene Gobble, MD;  Location: [facility];  Service: Cardiopulmonary;  Laterality: Bilateral;    REVIEW OF SYSTEMS:   Review of Systems  Constitutional: Negative for appetite change, chills, fatigue, fever and unexpected weight change.  HENT:   Negative for mouth sores, nosebleeds, sore throat and trouble swallowing.   Eyes: Negative for eye problems and icterus.  Respiratory: Negative for cough, hemoptysis, shortness of breath and wheezing.   Cardiovascular: Negative for chest pain and leg swelling.  Gastrointestinal: Negative for abdominal pain, constipation, diarrhea, nausea and vomiting.  Genitourinary: Negative for bladder incontinence, difficulty urinating, dysuria, frequency and hematuria.   Musculoskeletal: Negative for back pain, gait problem, neck pain and neck stiffness.  Skin: Negative for itching and rash.  Neurological: Negative for dizziness, extremity weakness, gait problem, headaches, light-headedness and seizures.  Hematological: Negative for adenopathy. Does not bruise/bleed easily.  Psychiatric/Behavioral: Negative for confusion, depression and sleep disturbance. The patient is not nervous/anxious.     PHYSICAL EXAMINATION:  There were no vitals taken for this visit.  ECOG PERFORMANCE STATUS: {CHL ONC ECOG [id number]  Physical Exam  Constitutional: Oriented to  person, place, and time and well-developed, well-nourished, and in no distress. No distress.  HENT:  Head: Normocephalic and atraumatic.  Mouth/Throat: Oropharynx is clear and moist. No oropharyngeal exudate.  Eyes: Conjunctivae are normal. Right eye exhibits no discharge. Left eye exhibits no discharge. No scleral icterus.  Neck: Normal range of motion. Neck supple.  Cardiovascular: Normal rate, regular rhythm, normal heart sounds and intact distal pulses.   Pulmonary/Chest: Effort normal and breath sounds normal. No respiratory distress. No wheezes. No rales.  Abdominal: Soft. Bowel sounds are normal. Exhibits no distension and no mass. There is no tenderness.  Musculoskeletal: Normal range of motion. Exhibits no edema.  Lymphadenopathy:    No cervical adenopathy.  Neurological: Alert and oriented to person, place, and time. Exhibits normal muscle tone. Gait normal. Coordination normal.  Skin: Skin is warm and dry. No rash noted. Not diaphoretic. No erythema. No pallor.  Psychiatric: Mood, memory and judgment normal.  Vitals reviewed.  LABORATORY DATA: Lab Results  Component Value Date   WBC 5.5 09/22/2021   HGB 10.2 (L) 09/22/2021   HCT 30.3 (L) 09/22/2021   MCV 90.2 09/22/2021   PLT 143 (L) 09/22/2021      Chemistry      Component Value  Date/Time   NA 138 09/22/2021 1039   NA 139 05/01/2021 1146   K 3.5 09/22/2021 1039   CL 104 09/22/2021 1039   CO2 30 09/22/2021 1039   BUN 16 09/22/2021 1039   BUN 20 05/01/2021 1146   CREATININE 1.25 (H) 09/22/2021 1039      Component Value Date/Time   CALCIUM 9.2 09/22/2021 1039   ALKPHOS 74 09/22/2021 1039   AST 12 (L) 09/22/2021 1039   ALT 9 09/22/2021 1039   BILITOT 0.8 09/22/2021 1039       RADIOGRAPHIC STUDIES:  No results found.   ASSESSMENT/PLAN:  No problem-specific Assessment & Plan notes found for this encounter.   No orders of the defined types were placed in this encounter.    I spent {CHL ONC TIME VISIT  - BUYZJ:[id number]} counseling the patient face to face. The total time spent in the appointment was {CHL ONC TIME VISIT - MCRFV:[id number]}.  Calen Geister L Bryant Saye, PA-C 09/25/21

## 2021-09-25 NOTE — Progress Notes (Signed)
                                                                                                                                                             Patient Name: Kevin [name] MRN: [id number] DOB: [birth year] Referring Physician: Jerene Bears (Profile Not Attached) Date of Service: 09/25/2021 [facility], [place]                                                        End Of Treatment Note  Diagnoses: C34.11-Malignant neoplasm of upper lobe, right bronchus or lung  Cancer Staging: Stage IIIC (T3, N3, M0) non-small cell lung cancer, adenocarcinoma presented with right upper lobe lung mass status post right upper lobe wedge resection but no lymph node dissection.  Intent: Curative  Radiation Treatment Dates: 08/11/2021 through 09/25/2021 Site Technique Total Dose (Gy) Dose per Fx (Gy) Completed Fx Beam Energies  Lung, Right: Lung_R 3D 60/60 2 30/30 6X, 10X  Lung, Right: Lung_R_Bst 3D 6/6 2 3/3 6X, 10X   Narrative: The patient tolerated radiation therapy relatively well. He developed fatigue and anticipated skin changes in the treatment field but significant esophagitis that was difficult to manage despite carafate, OTC antacids, viscous lidocaine, and suspension narcotics.   Plan: The patient will receive a call in about one month from the radiation oncology department. He will continue follow up with Dr. Julien Nordmann as well.   ________________________________________________    Carola Rhine, [facility]

## 2021-09-26 ENCOUNTER — Telehealth: Payer: Self-pay | Admitting: Physician Assistant

## 2021-09-26 NOTE — Telephone Encounter (Signed)
I called the patient to let him know that the appointments on 09/30/21 are old appointments that never got cancelled. I called him to let him know that he does not need to come in that day. His next appointment is 7/6 for his CT scan and he will see Dr. Julien Nordmann on 7/10 to review the results. The patient confirmed the following and was appreciative of the call.

## 2021-09-29 ENCOUNTER — Telehealth: Payer: Self-pay

## 2021-09-29 ENCOUNTER — Encounter (HOSPITAL_COMMUNITY): Payer: Self-pay | Admitting: *Deleted

## 2021-09-29 ENCOUNTER — Ambulatory Visit: Payer: Medicare HMO | Admitting: Dermatology

## 2021-09-29 ENCOUNTER — Other Ambulatory Visit: Payer: Self-pay

## 2021-09-29 ENCOUNTER — Encounter: Payer: Self-pay | Admitting: *Deleted

## 2021-09-29 ENCOUNTER — Emergency Department (HOSPITAL_COMMUNITY)
Admission: EM | Admit: 2021-09-29 | Discharge: 2021-09-29 | Discharge status: Against Medical Advice | Payer: Medicare HMO | Attending: Emergency Medicine | Admitting: Emergency Medicine

## 2021-09-29 DIAGNOSIS — Z85118 Personal history of other malignant neoplasm of bronchus and lung: Secondary | ICD-10-CM | POA: Diagnosis not present

## 2021-09-29 DIAGNOSIS — R55 Syncope and collapse: Secondary | ICD-10-CM | POA: Insufficient documentation

## 2021-09-29 DIAGNOSIS — Z5321 Procedure and treatment not carried out due to patient leaving prior to being seen by health care provider: Secondary | ICD-10-CM | POA: Insufficient documentation

## 2021-09-29 LAB — CBC
HCT: 28.4 % — ABNORMAL LOW (ref 39.0–52.0)
Hemoglobin: 9.3 g/dL — ABNORMAL LOW (ref 13.0–17.0)
MCH: 30.1 pg (ref 26.0–34.0)
MCHC: 32.7 g/dL (ref 30.0–36.0)
MCV: 91.9 fL (ref 80.0–100.0)
Platelets: 169 10*3/uL (ref 150–400)
RBC: 3.09 MIL/uL — ABNORMAL LOW (ref 4.22–5.81)
RDW: 16.3 % — ABNORMAL HIGH (ref 11.5–15.5)
WBC: 2.4 10*3/uL — ABNORMAL LOW (ref 4.0–10.5)
nRBC: 0 % (ref 0.0–0.2)

## 2021-09-29 LAB — BASIC METABOLIC PANEL
Anion gap: 5 (ref 5–15)
BUN: 19 mg/dL (ref 8–23)
CO2: 26 mmol/L (ref 22–32)
Calcium: 9.2 mg/dL (ref 8.9–10.3)
Chloride: 105 mmol/L (ref 98–111)
Creatinine, Ser: 1.12 mg/dL (ref 0.61–1.24)
GFR, Estimated: >60 mL/min (ref >60)
Glucose, Bld: 113 mg/dL — ABNORMAL HIGH (ref 70–99)
Potassium: 4.6 mmol/L (ref 3.5–5.1)
Sodium: 136 mmol/L (ref 135–145)

## 2021-09-29 NOTE — ED Triage Notes (Signed)
Pt states he passed out in the kitchen today.  Pt just finished chemo and radiation treatment last week for Lung CA.

## 2021-09-29 NOTE — Telephone Encounter (Signed)
Following up on phone call that scheduling received from wife regarding pt leaving AMA from ED after a fall. Spoke with Cassie PA who advised that pt should follow-up with Dr. Woody Seller PCP regarding reason for syncope that lead to patient fall in his home. Wife agreed to make appointment for follow-up.

## 2021-09-29 NOTE — Progress Notes (Signed)
Patient has completed concurrent chemo rad and has a follow up with Dr. Julien Nordmann in a few weeks.

## 2021-09-29 NOTE — ED Triage Notes (Signed)
Unsure of hitting his head with syncope. Pt on ASA and Plavix.

## 2021-09-30 ENCOUNTER — Inpatient Hospital Stay: Payer: Medicare HMO

## 2021-09-30 ENCOUNTER — Inpatient Hospital Stay: Payer: Medicare HMO | Admitting: Physician Assistant

## 2021-09-30 DIAGNOSIS — R55 Syncope and collapse: Secondary | ICD-10-CM | POA: Diagnosis not present

## 2021-09-30 DIAGNOSIS — T66XXXA Radiation sickness, unspecified, initial encounter: Secondary | ICD-10-CM | POA: Diagnosis not present

## 2021-09-30 DIAGNOSIS — Z789 Other specified health status: Secondary | ICD-10-CM | POA: Diagnosis not present

## 2021-09-30 DIAGNOSIS — I25119 Atherosclerotic heart disease of native coronary artery with unspecified angina pectoris: Secondary | ICD-10-CM | POA: Diagnosis not present

## 2021-09-30 DIAGNOSIS — I10 Essential (primary) hypertension: Secondary | ICD-10-CM | POA: Diagnosis not present

## 2021-09-30 DIAGNOSIS — Z299 Encounter for prophylactic measures, unspecified: Secondary | ICD-10-CM | POA: Diagnosis not present

## 2021-10-01 ENCOUNTER — Ambulatory Visit: Payer: Medicare HMO | Admitting: Student

## 2021-10-01 ENCOUNTER — Encounter: Payer: Self-pay | Admitting: Student

## 2021-10-01 VITALS — BP 111/46 | HR 59 | Temp 98.0°F | Resp 17 | Ht 70.0 in | Wt 168.8 lb

## 2021-10-01 DIAGNOSIS — I2510 Atherosclerotic heart disease of native coronary artery without angina pectoris: Secondary | ICD-10-CM

## 2021-10-01 DIAGNOSIS — Z951 Presence of aortocoronary bypass graft: Secondary | ICD-10-CM

## 2021-10-01 DIAGNOSIS — I9589 Other hypotension: Secondary | ICD-10-CM

## 2021-10-01 MED ORDER — METOPROLOL TARTRATE 25 MG PO TABS: 12.5000 mg | ORAL_TABLET | Freq: Two times a day (BID) | ORAL | 3 refills | Status: DC

## 2021-10-01 MED ORDER — RANOLAZINE ER 500 MG PO TB12: 500.0000 mg | ORAL_TABLET | Freq: Two times a day (BID) | ORAL | 3 refills | Status: DC

## 2021-10-01 NOTE — Progress Notes (Signed)
Primary Physician/Referring:  [name], MD  Patient ID: Kevin [name], male    DOB: Dec 13, 1940, 81 y.o.   MRN: [id number]  Chief Complaint  Patient presents with   Follow-up   Chest Pain   HPI:    Kevin [name]  is a 81 y.o. Caucasian male with history of hyperlipidemia, 30-pack-year history of smoking quit in 1987, who was referred to our office for evaluation of chest pain and abnormal EKG.  Patient underwent cardiac catheterization 05/01/2021 revealing multivessel disease including critical left main disease, subsequently underwent CABG x3 on 05/13/2021.  During admission patient diagnosed with right upper lung adenocarcinoma, and underwent lung wedge resection. He is following with CT surgery and oncology for further management.    Patient presents with his wife for urgent visit with concerns of syncopal episode and intermittent chest pain. Reports intermittent chest pain at rest lasting 10-15 minutes over the last several weeks. He has also been experiencing throat pain and difficulty swallowing secondary to chemotherapy/radiation for lung cancer.  Patient took a sublingual nitroglycerin for the first time last night which seemed to improve chest pain.  Patient also reports syncopal episode 2 days ago which was witnessed by his wife.  Patient says that he stood up and became lightheaded and dizzy, subsequently lost consciousness for several seconds according to his wife.  Denies tongue biting or shaking, denies loss of bowel or bladder control or symptoms of postictal state.  Past Medical History:  Diagnosis Date   Arthritis    Coronary artery disease    Hyperlipidemia    Social History   Tobacco Use   Smoking status: Former    Packs/day: 1.00    Years: 30.00    Total pack years: 30.00    Types: Cigarettes    Quit date: 04/14/1995    Years since quitting: 26.4   Smokeless tobacco: Never  Substance Use Topics   Alcohol use: Never   Marital Status: Married  ROS  Review  of Systems  Cardiovascular:  Positive for chest pain and syncope. Negative for dyspnea on exertion and leg swelling.  Gastrointestinal:  Negative for melena.    Objective  Blood pressure (!) 111/46, pulse (!) 59, temperature 98 F (36.7 C), temperature source Temporal, resp. rate 17, height 5\' 10"  (1.778 m), weight 168 lb 12.8 oz (76.6 kg), SpO2 100 %. Body mass index is 24.22 kg/m.     10/01/2021   11:30 AM 10/01/2021   11:15 AM 09/29/2021    1:04 PM  Vitals with BMI  Height  5\' 10"    Weight  168 lbs 13 oz   BMI  58.09   Systolic 983 93 95  Diastolic 46 53 65  Pulse 59 88 97    Orthostatic VS for the past 72 hrs (Last 3 readings):  Orthostatic BP Patient Position BP Location Cuff Size Orthostatic Pulse  10/01/21 1135 92/63 Standing Left Arm Normal 73  10/01/21 1133 98/56 Sitting Left Arm Normal 73  10/01/21 1131 112/51 Supine Left Arm Normal 54    Physical Exam Vitals reviewed.  Neck:     Vascular: No carotid bruit or JVD.  Cardiovascular:     Rate and Rhythm: Normal rate and regular rhythm.     Pulses: Intact distal pulses.     Heart sounds: Normal heart sounds. No murmur heard.    No gallop.  Pulmonary:     Effort: Pulmonary effort is normal.     Breath sounds: Normal breath sounds.  Musculoskeletal:  Right lower leg: No edema.     Left lower leg: No edema.     Laboratory examination:      Latest Ref Rng & Units 09/29/2021    1:32 PM 09/22/2021   10:39 AM 09/15/2021    8:53 AM  CMP  Glucose 70 - 99 mg/dL 113  103  129   BUN 8 - 23 mg/dL 19  16  24    Creatinine 0.61 - 1.24 mg/dL 1.12  1.25  1.22   Sodium 135 - 145 mmol/L 136  138  136   Potassium 3.5 - 5.1 mmol/L 4.6  3.5  4.0   Chloride 98 - 111 mmol/L 105  104  104   CO2 22 - 32 mmol/L 26  30  25    Calcium 8.9 - 10.3 mg/dL 9.2  9.2  9.3   Total Protein 6.5 - 8.1 g/dL  6.4  6.6   Total Bilirubin 0.3 - 1.2 mg/dL  0.8  1.3   Alkaline Phos 38 - 126 U/L  74  69   AST 15 - 41 U/L  12  10   ALT 0 - 44 U/L  9   7       Latest Ref Rng & Units 09/29/2021    1:32 PM 09/22/2021   10:39 AM 09/15/2021    8:53 AM  CBC  WBC 4.0 - 10.5 K/uL 2.4  5.5  0.8   Hemoglobin 13.0 - 17.0 g/dL 9.3  10.2  10.5   Hematocrit 39.0 - 52.0 % 28.4  30.3  31.3   Platelets 150 - 400 K/uL 169  143  139     Lipid Panel     Component Value Date/Time   LDLDIRECT 80 05/01/2021 1146   HEMOGLOBIN A1C Lab Results  Component Value Date   HGBA1C 5.2 05/13/2021   MPG 102.54 05/13/2021   TSH Recent Labs    05/07/21 0915  TSH 3.714   External labs:   07/05/2020: Total cholesterol 149, triglycerides 134, HDL 40, LDL 85.  Allergies  No Known Allergies   Medication list after today's encounter    Current Outpatient Medications:    albuterol (VENTOLIN HFA) 108 (90 Base) MCG/ACT inhaler, Inhale 1-2 puffs into the lungs every 6 (six) hours as needed for wheezing or shortness of breath., Disp: 18 g, Rfl: 3   aspirin EC 81 MG tablet, Take 81 mg by mouth daily., Disp: , Rfl:    atorvastatin (LIPITOR) 80 MG tablet, Take 1 tablet (80 mg total) by mouth daily., Disp: 90 tablet, Rfl: 3   Calcium Carb-Cholecalciferol (CALCIUM 600 + D PO), Take 1,200 mg by mouth daily., Disp: , Rfl:    Cholecalciferol (DIALYVITE VITAMIN D 5000) 125 MCG (5000 UT) capsule, Take 5,000 Units by mouth daily., Disp: , Rfl:    clopidogrel (PLAVIX) 75 MG tablet, Take 1 tablet (75 mg total) by mouth daily. Okay to restart this medication on 07/23/2021, Disp: 90 tablet, Rfl: 2   Fluticasone-Umeclidin-Vilant (TRELEGY ELLIPTA) 100-62.5-25 MCG/ACT AEPB, Inhale 1 puff into the lungs daily., Disp: 60 each, Rfl: 0   lidocaine (XYLOCAINE) 2 % solution, Use as directed 10 mLs in the mouth or throat every 4 (four) hours as needed., Disp: 450 mL, Rfl: 1   nitroGLYCERIN (NITROSTAT) 0.4 MG SL tablet, Place 0.4 mg under the tongue every 5 (five) minutes as needed for chest pain., Disp: , Rfl:    oxyCODONE (OXY IR/ROXICODONE) 5 MG immediate release tablet, Take 1-2  tablets (5-10 mg total)  by mouth every 6 (six) hours as needed for severe pain., Disp: 90 tablet, Rfl: 0   prochlorperazine (COMPAZINE) 10 MG tablet, Take 1 tablet (10 mg total) by mouth every 6 (six) hours as needed for nausea or vomiting., Disp: 30 tablet, Rfl: 0   ranolazine (RANEXA) 500 MG 12 hr tablet, Take 1 tablet (500 mg total) by mouth 2 (two) times daily., Disp: 60 tablet, Rfl: 3   sucralfate (CARAFATE) 1 GM/10ML suspension, Take 1 g by mouth 4 (four) times daily., Disp: , Rfl:    metoprolol tartrate (LOPRESSOR) 25 MG tablet, Take 0.5 tablets (12.5 mg total) by mouth 2 (two) times daily., Disp: 180 tablet, Rfl: 3  Radiology:   CT chest 05/07/2021: 1. Right upper lobe spiculated lesions consistent with malignancy. Multidisciplinary consult is advised. 2. No evidence of metastatic disease. 3. Aortic Atherosclerosis (ICD10-I70.0) and Emphysema (ICD10-J43.9).  Chest x-ray 05/16/2021: 1. Post removal of chest support tubes and RIGHT IJ central venous line. No visible pneumothorax. 2. Improved aeration in the chest with trace LEFT effusion. 3. Diminished opacity in the RIGHT upper lobe adjacent to surgical changes at the site of previous mass may reflect resolving pleural fluid and or atelectasis in this area, close attention on follow-up.  Cardiac Studies:   Left Heart Catheterization 05/06/21:  LV: 126/4, EDP 11 mmHg.  Ao 123/46, mean 33 mmHg.  No pressure gradient across aortic valve. LVEF: 40 to 45% with global hypokinesis.  LV is mildly dilated. LM: High-grade, complex, calcified 95% distal left main stenosis. LAD: Proximal segment mild to moderately calcified.  Proximal to mid 60% calcific stenosis.  Gives origin to 3 large diagonals and a fourth large diagonal from the mid to distal segment, mild disease.  No significant calcification mid to distal LAD. CX: Gives origin to high OM1 which is got mild disease, after that there is a 60% calcific focal stenosis.  Mid to distal circumflex  is large, dominant with very mild noncalcific disease. RCA: Nondominant, normal.   Echocardiogram 05/07/2021:  1. Left ventricular ejection fraction, by estimation, is 40 to 45%. The left ventricle has mildly decreased function. The left ventricle demonstrates global hypokinesis. Left ventricular diastolic parameters were normal.   2. Right ventricular systolic function is normal. The right ventricular size is normal.   3. The mitral valve is normal in structure. Trivial mitral valve regurgitation. No evidence of mitral stenosis.   4. The aortic valve is normal in structure. Aortic valve regurgitation is not visualized. No aortic stenosis is present.  CABG x3 05/13/2021:  LIMA to LAD, reverse SVG to OM- 1 and left PDA. Core needle biopsy of the right upper lobe mass unsuccessful and hence underwent right upper lobe wedge resection  EKG:  10/01/2021: Sinus rhythm with single PVC at a rate of 70 bpm.  Normal axis.  Poor R wave progression, cannot exclude anteroseptal infarct old.  Nonspecific ST-T wave abnormality, less prominent compared to previous.   EKG 05/28/2021: Normal sinus rhythm at rate of 68 bpm, normal axis, LVH with repolarization abnormality, cannot exclude inferior lateral ischemia.  PAC (3).   Assessment     ICD-10-CM   1. Coronary artery disease involving native coronary artery of native heart without angina pectoris  I25.10 EKG 12-Lead    metoprolol tartrate (LOPRESSOR) 25 MG tablet    2. CABG x3 05/13/2021: LIMA to LAD, reverse SVG to OM- 1 and left PDA.  Z95.1     3. Other specified hypotension  I95.89  Medications Discontinued During This Encounter  Medication Reason   metoprolol tartrate (LOPRESSOR) 25 MG tablet      Meds ordered this encounter  Medications   metoprolol tartrate (LOPRESSOR) 25 MG tablet    Sig: Take 0.5 tablets (12.5 mg total) by mouth 2 (two) times daily.    Dispense:  180 tablet    Refill:  3   ranolazine (RANEXA) 500 MG 12 hr tablet     Sig: Take 1 tablet (500 mg total) by mouth 2 (two) times daily.    Dispense:  60 tablet    Refill:  3   Orders Placed This Encounter  Procedures   EKG 12-Lead    Recommendations:   Kevin [name] is a 81 y.o. Caucasian male with history of hyperlipidemia, 30-pack-year history of smoking quit in 1987, who was referred to our office for evaluation of chest pain and abnormal EKG.  Patient underwent cardiac catheterization 05/01/2021 revealing multivessel disease including critical left main disease, subsequently underwent CABG x3 on 05/13/2021.  During admission patient diagnosed with right upper lung adenocarcinoma, and underwent lung wedge resection. He is following with CT surgery and oncology for further management.    Suspect patient's recent syncopal episode is related to hypotension given low blood pressure in the office today.  We will reduce Lopressor to 12.5 mg p.o. twice daily.  Patient will monitor blood pressure at home and if lightheadedness and blood pressure do not improve in the next 1 week patient will discontinue Lopressor altogether.  In regard to chest pain, symptoms are atypical.  However we will add Ranexa 500 mg p.o. twice daily given underlying CAD.  This may also be related to recent chemotherapy and radiation treatment.  Follow-up in 4 to 6 weeks, sooner if needed.   Alethia Berthold, PA-C 10/01/2021, 1:38 PM Office: [phone]

## 2021-10-06 ENCOUNTER — Encounter: Payer: Self-pay | Admitting: Dermatology

## 2021-10-06 ENCOUNTER — Ambulatory Visit (INDEPENDENT_AMBULATORY_CARE_PROVIDER_SITE_OTHER): Payer: Medicare HMO | Admitting: Dermatology

## 2021-10-06 DIAGNOSIS — Z85828 Personal history of other malignant neoplasm of skin: Secondary | ICD-10-CM

## 2021-10-06 DIAGNOSIS — D1801 Hemangioma of skin and subcutaneous tissue: Secondary | ICD-10-CM

## 2021-10-06 DIAGNOSIS — Z1283 Encounter for screening for malignant neoplasm of skin: Secondary | ICD-10-CM

## 2021-10-06 DIAGNOSIS — L589 Radiodermatitis, unspecified: Secondary | ICD-10-CM

## 2021-10-06 DIAGNOSIS — L720 Epidermal cyst: Secondary | ICD-10-CM | POA: Diagnosis not present

## 2021-10-16 ENCOUNTER — Inpatient Hospital Stay: Payer: Medicare HMO | Attending: Internal Medicine

## 2021-10-16 ENCOUNTER — Ambulatory Visit (HOSPITAL_COMMUNITY)
Admission: RE | Admit: 2021-10-16 | Discharge: 2021-10-16 | Disposition: A | Discharge status: Home / Self Care | Payer: Medicare HMO | Source: Ambulatory Visit | Attending: Internal Medicine | Admitting: Internal Medicine

## 2021-10-16 ENCOUNTER — Other Ambulatory Visit: Payer: Self-pay

## 2021-10-16 DIAGNOSIS — C3490 Malignant neoplasm of unspecified part of unspecified bronchus or lung: Secondary | ICD-10-CM | POA: Insufficient documentation

## 2021-10-16 DIAGNOSIS — Z902 Acquired absence of lung [part of]: Secondary | ICD-10-CM | POA: Insufficient documentation

## 2021-10-16 DIAGNOSIS — Z5112 Encounter for antineoplastic immunotherapy: Secondary | ICD-10-CM | POA: Insufficient documentation

## 2021-10-16 DIAGNOSIS — C3491 Malignant neoplasm of unspecified part of right bronchus or lung: Secondary | ICD-10-CM

## 2021-10-16 DIAGNOSIS — R911 Solitary pulmonary nodule: Secondary | ICD-10-CM | POA: Diagnosis not present

## 2021-10-16 DIAGNOSIS — C3411 Malignant neoplasm of upper lobe, right bronchus or lung: Secondary | ICD-10-CM | POA: Insufficient documentation

## 2021-10-16 DIAGNOSIS — J439 Emphysema, unspecified: Secondary | ICD-10-CM | POA: Diagnosis not present

## 2021-10-16 LAB — CBC WITH DIFFERENTIAL (CANCER CENTER ONLY)
Abs Immature Granulocytes: 0.01 10*3/uL (ref 0.00–0.07)
Basophils Absolute: 0 10*3/uL (ref 0.0–0.1)
Basophils Relative: 1 %
Eosinophils Absolute: 0.1 10*3/uL (ref 0.0–0.5)
Eosinophils Relative: 2 %
HCT: 27.5 % — ABNORMAL LOW (ref 39.0–52.0)
Hemoglobin: 9.1 g/dL — ABNORMAL LOW (ref 13.0–17.0)
Immature Granulocytes: 0 %
Lymphocytes Relative: 17 %
Lymphs Abs: 0.7 10*3/uL (ref 0.7–4.0)
MCH: 32.3 pg (ref 26.0–34.0)
MCHC: 33.1 g/dL (ref 30.0–36.0)
MCV: 97.5 fL (ref 80.0–100.0)
Monocytes Absolute: 0.6 10*3/uL (ref 0.1–1.0)
Monocytes Relative: 15 %
Neutro Abs: 2.6 10*3/uL (ref 1.7–7.7)
Neutrophils Relative %: 65 %
Platelet Count: 133 10*3/uL — ABNORMAL LOW (ref 150–400)
RBC: 2.82 MIL/uL — ABNORMAL LOW (ref 4.22–5.81)
RDW: 20.2 % — ABNORMAL HIGH (ref 11.5–15.5)
WBC Count: 3.9 10*3/uL — ABNORMAL LOW (ref 4.0–10.5)
nRBC: 0 % (ref 0.0–0.2)

## 2021-10-16 LAB — CMP (CANCER CENTER ONLY)
ALT: 10 U/L (ref 0–44)
AST: 14 U/L — ABNORMAL LOW (ref 15–41)
Albumin: 3.8 g/dL (ref 3.5–5.0)
Alkaline Phosphatase: 77 U/L (ref 38–126)
Anion gap: 6 (ref 5–15)
BUN: 16 mg/dL (ref 8–23)
CO2: 28 mmol/L (ref 22–32)
Calcium: 9.3 mg/dL (ref 8.9–10.3)
Chloride: 105 mmol/L (ref 98–111)
Creatinine: 1.31 mg/dL — ABNORMAL HIGH (ref 0.61–1.24)
GFR, Estimated: 56 mL/min — ABNORMAL LOW (ref >60)
Glucose, Bld: 92 mg/dL (ref 70–99)
Potassium: 4.3 mmol/L (ref 3.5–5.1)
Sodium: 139 mmol/L (ref 135–145)
Total Bilirubin: 1.1 mg/dL (ref 0.3–1.2)
Total Protein: 6.4 g/dL — ABNORMAL LOW (ref 6.5–8.1)

## 2021-10-16 MED ORDER — SODIUM CHLORIDE (PF) 0.9 % IJ SOLN
INTRAMUSCULAR | Status: AC
  Filled 2021-10-16: qty 50

## 2021-10-16 MED ORDER — IOHEXOL 300 MG/ML  SOLN
75.0000 mL | Freq: Once | INTRAMUSCULAR | Status: AC | PRN
  Administered 2021-10-16: 75 mL via INTRAVENOUS

## 2021-10-20 ENCOUNTER — Other Ambulatory Visit: Payer: Medicare HMO

## 2021-10-20 ENCOUNTER — Other Ambulatory Visit: Payer: Self-pay

## 2021-10-20 ENCOUNTER — Inpatient Hospital Stay (HOSPITAL_BASED_OUTPATIENT_CLINIC_OR_DEPARTMENT_OTHER): Payer: Medicare HMO | Admitting: Internal Medicine

## 2021-10-20 ENCOUNTER — Encounter: Payer: Self-pay | Admitting: *Deleted

## 2021-10-20 VITALS — BP 116/58 | HR 77 | Temp 97.7°F | Resp 15 | Wt 170.0 lb

## 2021-10-20 DIAGNOSIS — Z902 Acquired absence of lung [part of]: Secondary | ICD-10-CM | POA: Diagnosis not present

## 2021-10-20 DIAGNOSIS — C3411 Malignant neoplasm of upper lobe, right bronchus or lung: Secondary | ICD-10-CM | POA: Diagnosis not present

## 2021-10-20 DIAGNOSIS — C3491 Malignant neoplasm of unspecified part of right bronchus or lung: Secondary | ICD-10-CM

## 2021-10-20 DIAGNOSIS — Z5112 Encounter for antineoplastic immunotherapy: Secondary | ICD-10-CM

## 2021-10-20 NOTE — Progress Notes (Signed)
I spoke with Mr. Charney today. His tx plan is changing to IO therapy.  I uploaded information on his new medication to his AVS.

## 2021-10-20 NOTE — Progress Notes (Signed)
[place] [phone]   [phone]  OFFICE PROGRESS NOTE  [name], MD [place] [address]  DIAGNOSIS: Stage IIIC (T3, N3, M0) non-small cell lung cancer, adenocarcinoma presented with right upper lobe lung mass status post right upper lobe wedge resection but no lymph node dissection.  The tumor measured 3.3 cm with visceral pleural involvement and very close resection margin less than 0.1 cm from the tumor.  This was performed on May 13, 2021 in the same setting of CABG under the care of Dr. Kipp Brood.  PET scan performed in March 2023 showed a right upper lobe nodule adjacent to the wedge resection site small hypermetabolic right hilar, right subcarinal, and left infrahilar lymph nodes which could be reactive but are suspicious for nodal involvement.   Molecular studies showed no actionable mutations and PD-L1 expression was 0%   PRIOR THERAPY:  1) Right upper lobe wedge resection under the care of Dr. Kipp Brood on 05/13/2021. 2) Concurrent chemoradiation with weekly carboplatin for AUC of 2 and paclitaxel 45 Mg/M2.  First dose Aug 11, 2021.  Status post 6 cycles.  Last dose was given September 22, 2021   CURRENT THERAPY: Consolidation treatment with immunotherapy with Imfinzi 1500 Mg IV every 4 weeks.  First dose October 27, 2021.  INTERVAL HISTORY: Kevin [name] 81 y.o. male returns to the clinic today for follow-up visit accompanied by his wife and his daughter was available by FaceTime during the visit.  The patient is feeling much better today with no concerning complaints except for mild fatigue.  His swallowing has significantly improved.  He also has mild shortness of breath with exertion with mild cough and no hemoptysis.  He denied having any chest pain.  He has no fever or chills.  He has no nausea, vomiting, diarrhea or constipation.  He tolerated the previous course of concurrent chemoradiation fairly well.  He had repeat CT  scan of the chest performed recently and is here for evaluation and discussion of his scan results.    MEDICAL HISTORY: Past Medical History:  Diagnosis Date   Arthritis    Coronary artery disease    Hyperlipidemia    SCC (squamous cell carcinoma) 05/28/2004   top of left hand curet x3 64f   SCC (squamous cell carcinoma) 09/13/2013   mid back tx cx3 587f  SCC (squamous cell carcinoma) 09/06/2018   left wrist tx with bx   Squamous cell carcinoma of skin 11/16/2003   left ear tip tx curet and cautery [id number]  ALLERGIES:  has No Known Allergies.  MEDICATIONS:  Current Outpatient Medications  Medication Sig Dispense Refill   albuterol (VENTOLIN HFA) 108 (90 Base) MCG/ACT inhaler Inhale 1-2 puffs into the lungs every 6 (six) hours as needed for wheezing or shortness of breath. 18 g 3   aspirin EC 81 MG tablet Take 81 mg by mouth daily.     atorvastatin (LIPITOR) 80 MG tablet Take 1 tablet (80 mg total) by mouth daily. 90 tablet 3   Calcium Carb-Cholecalciferol (CALCIUM 600 + D PO) Take 1,200 mg by mouth daily.     Cholecalciferol (DIALYVITE VITAMIN D 5000) 125 MCG (5000 UT) capsule Take 5,000 Units by mouth daily.     clopidogrel (PLAVIX) 75 MG tablet Take 1 tablet (75 mg total) by mouth daily. Okay to restart this medication on 07/23/2021 90 tablet 2   Fluticasone-Umeclidin-Vilant (TRELEGY ELLIPTA) 100-62.5-25 MCG/ACT AEPB Inhale 1 puff  into the lungs daily. 60 each 0   lidocaine (XYLOCAINE) 2 % solution Use as directed 10 mLs in the mouth or throat every 4 (four) hours as needed. 450 mL 1   metoprolol tartrate (LOPRESSOR) 25 MG tablet Take 0.5 tablets (12.5 mg total) by mouth 2 (two) times daily. 180 tablet 3   nitroGLYCERIN (NITROSTAT) 0.4 MG SL tablet Place 0.4 mg under the tongue every 5 (five) minutes as needed for chest pain.     oxyCODONE (OXY IR/ROXICODONE) 5 MG immediate release tablet Take 1-2 tablets (5-10 mg total) by mouth every 6 (six) hours as needed for severe pain. 90  tablet 0   prochlorperazine (COMPAZINE) 10 MG tablet Take 1 tablet (10 mg total) by mouth every 6 (six) hours as needed for nausea or vomiting. 30 tablet 0   ranolazine (RANEXA) 500 MG 12 hr tablet Take 1 tablet (500 mg total) by mouth 2 (two) times daily. 60 tablet 3   sucralfate (CARAFATE) 1 GM/10ML suspension Take 1 g by mouth 4 (four) times daily.     No current facility-administered medications for this visit.    SURGICAL HISTORY:  Past Surgical History:  Procedure Laterality Date   BRONCHIAL BIOPSY  07/22/2021   Procedure: BRONCHIAL BIOPSIES;  Surgeon: Collene Gobble, MD;  Location: [facility];  Service: Cardiopulmonary;;   BRONCHIAL BRUSHINGS  07/22/2021   Procedure: BRONCHIAL BRUSHINGS;  Surgeon: Collene Gobble, MD;  Location: [facility];  Service: Cardiopulmonary;;   BRONCHIAL NEEDLE ASPIRATION BIOPSY  07/22/2021   Procedure: BRONCHIAL NEEDLE ASPIRATION BIOPSIES;  Surgeon: Collene Gobble, MD;  Location: [facility];  Service: Cardiopulmonary;;   CORONARY ARTERY BYPASS GRAFT N/A 05/13/2021   Procedure: CORONARY ARTERY BYPASS GRAFTING (CABG) TIMES 3  , ON PUMP, USING LEFT INTERNAL MAMMARY ARTERY AND RIGHT GREATER SAPHENOUS VEIN HARVESTED ENDOSCOPICALLY;  Surgeon: Lajuana Matte, MD;  Location: [facility];  Service: Open Heart Surgery;  Laterality: N/A;   ENDOVEIN HARVEST OF GREATER SAPHENOUS VEIN Right 05/13/2021   Procedure: ENDOVEIN HARVEST OF GREATER SAPHENOUS VEIN;  Surgeon: Lajuana Matte, MD;  Location: [facility];  Service: Open Heart Surgery;  Laterality: Right;   epidural     back pain   IR CT SPINE LTD  06/18/2020   IR KYPHO LUMBAR INC FX REDUCE BONE BX UNI/BIL CANNULATION INC/IMAGING  06/18/2020   LEFT HEART CATH AND CORONARY ANGIOGRAPHY N/A 05/06/2021   Procedure: LEFT HEART CATH AND CORONARY ANGIOGRAPHY;  Surgeon: [name], MD;  Location: [facility];  Service: Cardiovascular;  Laterality: N/A;   LUNG BIOPSY Right 05/13/2021   Procedure: RIGHT LUNG BIOPSY;   Surgeon: Lajuana Matte, MD;  Location: [facility];  Service: Open Heart Surgery;  Laterality: Right;   MINOR HEMORRHOIDECTOMY     SHOULDER SURGERY     TEE WITHOUT CARDIOVERSION N/A 05/13/2021   Procedure: TRANSESOPHAGEAL ECHOCARDIOGRAM (TEE);  Surgeon: Lajuana Matte, MD;  Location: [facility];  Service: Open Heart Surgery;  Laterality: N/A;   TRANSURETHRAL RESECTION OF BLADDER TUMOR  2014   TRANSURETHRAL RESECTION OF BLADDER TUMOR N/A 05/19/2018   Procedure: TRANSURETHRAL RESECTION OF BLADDER TUMOR (TURBT) AND (INTRAVESICAL GEMCITABINE INSTILLED IN BLADDER IN PACU) ;  Surgeon: Franchot Gallo, MD;  Location: [facility];  Service: Urology;  Laterality: N/A;  [facility] Bilateral 07/22/2021   Procedure: VIDEO BRONCHOSCOPY WITH ENDOBRONCHIAL ULTRASOUND;  Surgeon: Collene Gobble, MD;  Location: [facility];  Service: Cardiopulmonary;  Laterality: Bilateral;  REVIEW OF SYSTEMS:  Constitutional: positive for fatigue Eyes: negative Ears, nose, mouth, throat, and face: negative Respiratory: positive for dyspnea on exertion Cardiovascular: negative Gastrointestinal: negative Genitourinary:negative Integument/breast: negative Hematologic/lymphatic: negative Musculoskeletal:negative Neurological: negative Behavioral/Psych: negative Endocrine: negative Allergic/Immunologic: negative   PHYSICAL EXAMINATION: General appearance: alert, cooperative, fatigued, and no distress Head: Normocephalic, without obvious abnormality, atraumatic Neck: no adenopathy, no JVD, supple, symmetrical, trachea midline, and thyroid not enlarged, symmetric, no tenderness/mass/nodules Lymph nodes: Cervical, supraclavicular, and axillary nodes normal. Resp: clear to auscultation bilaterally Back: symmetric, no curvature. ROM normal. No CVA tenderness. Cardio: regular rate and rhythm, S1, S2 normal, no murmur, click, rub or gallop GI: soft, non-tender; bowel sounds  normal; no masses,  no organomegaly Extremities: extremities normal, atraumatic, no cyanosis or edema Neurologic: Alert and oriented X 3, normal strength and tone. Normal symmetric reflexes. Normal coordination and gait  ECOG PERFORMANCE STATUS: 1 - Symptomatic but completely ambulatory  Blood pressure (!) 116/58, pulse 77, temperature 97.7 F (36.5 C), temperature source Temporal, resp. rate 15, weight 170 lb (77.1 kg), SpO2 99 %.  LABORATORY DATA: Lab Results  Component Value Date   WBC 3.9 (L) 10/16/2021   HGB 9.1 (L) 10/16/2021   HCT 27.5 (L) 10/16/2021   MCV 97.5 10/16/2021   PLT 133 (L) 10/16/2021      Chemistry      Component Value Date/Time   NA 139 10/16/2021 0947   NA 139 05/01/2021 1146   K 4.3 10/16/2021 0947   CL 105 10/16/2021 0947   CO2 28 10/16/2021 0947   BUN 16 10/16/2021 0947   BUN 20 05/01/2021 1146   CREATININE 1.31 (H) 10/16/2021 0947      Component Value Date/Time   CALCIUM 9.3 10/16/2021 0947   ALKPHOS 77 10/16/2021 0947   AST 14 (L) 10/16/2021 0947   ALT 10 10/16/2021 0947   BILITOT 1.1 10/16/2021 0947       RADIOGRAPHIC STUDIES: CT Chest W Contrast  Result Date: 10/16/2021 CLINICAL DATA:  Non-small-cell lung cancer. Restaging. * Tracking Code: BO * EXAM: CT CHEST WITH CONTRAST TECHNIQUE: Multidetector CT imaging of the chest was performed during intravenous contrast administration. RADIATION DOSE REDUCTION: This exam was performed according to the departmental dose-optimization program which includes automated exposure control, adjustment of the mA and/or kV according to patient size and/or use of iterative reconstruction technique. CONTRAST:  37m OMNIPAQUE IOHEXOL 300 MG/ML  SOLN COMPARISON:  PET-CT 07/07/2021 FINDINGS: Cardiovascular: The heart size is normal. No substantial pericardial effusion. Coronary artery calcification is evident. Status post CABG. Moderate atherosclerotic calcification is noted in the wall of the thoracic aorta.  Mediastinum/Nodes: The tiny mediastinal lymph nodes identified on recent PET-CT are stable. 5 mm short axis right subcarinal node measured on that study is 5 mm again today on image 75/2. There is no hilar lymphadenopathy. The esophagus has normal imaging features. There is no axillary lymphadenopathy. Lungs/Pleura: Centrilobular and paraseptal emphysema evident. The right upper lobe pulmonary nodule adjacent to the staple line has decreased in size in the interval. This nodule measures 3.0 x 1.4 cm today compared to 3.3 x 2.2 cm when remeasured in a similar fashion on the previous exam. Architectural distortion and scarring in the right lung is compatible with a previous surgery. No new suspicious pulmonary nodule or mass. No focal airspace consolidation. There is no evidence of pleural effusion. Upper Abdomen: tiny calcified gallstone evident. There is a incompletely visualized stone in the upper pole right kidney measuring 2 mm. Musculoskeletal: No worrisome  lytic or sclerotic osseous abnormality. IMPRESSION: 1. Interval decrease in size of the right upper lobe pulmonary nodule adjacent to the staple line. No new or progressive findings on today's study. 2. Tiny mediastinal lymph nodes identified on recent PET-CT are stable. 3. Tiny right pleural effusion seen previously has resolved in the interval. 4. Cholelithiasis. 5. Aortic Atherosclerosis (ICD10-I70.0) and Emphysema (ICD10-J43.9). Electronically Signed   By: Misty Stanley M.D.   On: 10/16/2021 13:12    ASSESSMENT AND PLAN: This is a very pleasant 81 years old white male diagnosed with stage IIIC (T3, N3, M0) non-small cell lung cancer, adenocarcinoma with no actionable mutations and PD-L1 expression of 0% diagnosed in January 2023 status post wedge resection of the right upper lobe lung mass in a setting of CABG under the care of Dr. Kipp Brood but the PET scan showed residual disease in the right upper lobe in addition to hypermetabolic right hilar,  subcarinal and left infrahilar lymph nodes. The patient was seen by Dr. Lamonte Sakai and biopsy of the right upper lobe as well as the subcarinal and left hilar lymph nodes were positive for malignancy consistent with non-small cell lung cancer, adenocarcinoma. He underwent a course of concurrent chemoradiation with weekly carboplatin for AUC of 2 and paclitaxel 45 Mg/M2 status post 6 cycles.  The patient tolerated this previous course of concurrent chemoradiation fairly well except for fatigue and mild odynophagia. He had repeat CT scan of the chest performed recently.  I personally and independently reviewed the scan images and discussed the result with the patient and his family and showed them the images. His scan showed interval decrease in the size of the right upper lobe pulmonary nodule with no evidence for disease progression. I recommended for the patient to consider treatment with consolidation immunotherapy with Imfinzi 1500 Mg IV every 4 weeks. I discussed with the patient the adverse effect of this treatment including but not limited to immunotherapy mediated skin rash, diarrhea, inflammation of the lung, kidney, liver, thyroid or other endocrine dysfunction. The patient is expected to start the first cycle of this treatment on October 27, 2021. He will come back for follow-up visit in 5 weeks for evaluation with the start of cycle #2. He was advised to call immediately if he has any other concerning symptoms in the interval.  The patient voices understanding of current disease status and treatment options and is in agreement with the current care plan. All questions were answered. The patient knows to call the clinic with any problems, questions or concerns. We can certainly see the patient much sooner if necessary. The total time spent in the appointment was 30 minutes.   Disclaimer: This note was dictated with voice recognition software. Similar sounding words can inadvertently be transcribed  and may not be corrected upon review.

## 2021-10-20 NOTE — Patient Instructions (Signed)
Durvalumab injection What is this medication? DURVALUMAB (dur VAL ue mab) is a monoclonal antibody. It is used to treat lung cancer. This medicine may be used for other purposes; ask your health care provider or pharmacist if you have questions. COMMON BRAND NAME(S): IMFINZI What should I tell my care team before I take this medication? They need to know if you have any of these conditions: autoimmune diseases like Crohn's disease, ulcerative colitis, or lupus have had or planning to have an allogeneic stem cell transplant (uses someone else's stem cells) history of organ transplant history of radiation to the chest nervous system problems like myasthenia gravis or Guillain-Barre syndrome an unusual or allergic reaction to durvalumab, other medicines, foods, dyes, or preservatives pregnant or trying to get pregnant breast-feeding How should I use this medication? This medicine is for infusion into a vein. It is given by a health care professional in a hospital or clinic setting. A special MedGuide will be given to you before each treatment. Be sure to read this information carefully each time. Talk to your pediatrician regarding the use of this medicine in children. Special care may be needed. Overdosage: If you think you have taken too much of this medicine contact a poison control center or emergency room at once. NOTE: This medicine is only for you. Do not share this medicine with others. What if I miss a dose? It is important not to miss your dose. Call your doctor or health care professional if you are unable to keep an appointment. What may interact with this medication? Interactions have not been studied. This list may not describe all possible interactions. Give your health care provider a list of all the medicines, herbs, non-prescription drugs, or dietary supplements you use. Also tell them if you smoke, drink alcohol, or use illegal drugs. Some items may interact with your  medicine. What should I watch for while using this medication? This medication may make you feel generally unwell. Continue your course of treatment even though you feel ill unless your care team tells you to stop. You may need blood work done while you are taking this medication. Do not become pregnant while taking this medication or for 3 months after stopping it. Women should inform their care team if they wish to become pregnant or think they might be pregnant. There is a potential for serious side effects to an unborn child. Talk to your care team or pharmacist for more information. Do not breast-feed an infant while taking this medication or for 3 months after stopping it. What side effects may I notice from receiving this medication? Side effects that you should report to your care team as soon as possible: Allergic reactions--skin rash, itching, hives, swelling of the face, lips, tongue, or throat Bloody or watery diarrhea Dizziness, loss of balance or coordination, confusion or trouble speaking Dry cough, shortness of breath or trouble breathing Flushing, mostly over the face, neck, and chest, during injection High blood sugar (hyperglycemia)--increased thirst or amount of urine, unusual weakness or fatigue, blurry vision High thyroid levels (hyperthyroidism)--fast or irregular heartbeat, weight loss, excessive sweating or sensitivity to heat, tremors or shaking, anxiety, nervousness, irregular menstrual cycle or spotting Infection--fever, chills, cough, or sore throat Liver injury--right upper belly pain, loss of appetite, nausea, light-colored stool, dark yellow or brown urine, yellowing skin or eyes, unusual weakness or fatigue Low adrenal gland function--nausea, vomiting, loss of appetite, unusual weakness or fatigue, dizziness, low blood pressure Low thyroid levels (hypothyroidism)--unusual weakness or fatigue,  increased sensitivity to cold, constipation, hair loss, dry skin, weight  gain, feelings of depression Pancreatitis--severe stomach pain that spreads to your back or gets worse after eating or when touched, fever, nausea, vomiting Rash, fever, and swollen lymph nodes Redness, blistering, peeling or loosening of the skin, including inside the mouth Wheezing--trouble breathing with loud or whistling sounds Side effects that usually do not require medical attention (report these to your care team if they continue or are bothersome): Fatigue Hair loss This list may not describe all possible side effects. Call your doctor for medical advice about side effects. You may report side effects to FDA at 1-800-FDA-1088. Where should I keep my medication? This medication is given in a hospital or clinic. It will not be stored at home. NOTE: This sheet is a summary. It may not cover all possible information. If you have questions about this medicine, talk to your doctor, pharmacist, or health care provider.  2023 Elsevier/Gold Standard (2021-02-28 00:00:00)

## 2021-10-22 ENCOUNTER — Other Ambulatory Visit: Payer: Self-pay | Admitting: Internal Medicine

## 2021-10-22 NOTE — Progress Notes (Signed)
DISCONTINUE ON PATHWAY REGIMEN - Non-Small Cell Lung     Administer weekly:     Paclitaxel      Carboplatin   **Always confirm dose/schedule in your pharmacy ordering system**  REASON: Continuation Of Treatment PRIOR TREATMENT: [id number]: Carboplatin AUC=2 + Paclitaxel 45 mg/m2 Weekly During Radiation TREATMENT RESPONSE: Partial Response (PR)  START ON PATHWAY REGIMEN - Non-Small Cell Lung     A cycle is every 28 days:     Durvalumab   **Always confirm dose/schedule in your pharmacy ordering system**  Patient Characteristics: Preoperative or Nonsurgical Candidate (Clinical Staging), Stage III - Nonsurgical Candidate (Nonsquamous and Squamous), PS = 0, 1 Therapeutic Status: Preoperative or Nonsurgical Candidate (Clinical Staging) AJCC T Category: cT3 AJCC N Category: cN2 AJCC M Category: cM0 AJCC 8 Stage Grouping: IIIB ECOG Performance Status: 1 Intent of Therapy: Curative Intent, Discussed with Patient

## 2021-10-23 DIAGNOSIS — E7800 Pure hypercholesterolemia, unspecified: Secondary | ICD-10-CM | POA: Diagnosis not present

## 2021-10-23 DIAGNOSIS — Z0000 Encounter for general adult medical examination without abnormal findings: Secondary | ICD-10-CM | POA: Diagnosis not present

## 2021-10-23 DIAGNOSIS — R5383 Other fatigue: Secondary | ICD-10-CM | POA: Diagnosis not present

## 2021-10-23 DIAGNOSIS — Z6823 Body mass index (BMI) 23.0-23.9, adult: Secondary | ICD-10-CM | POA: Diagnosis not present

## 2021-10-23 DIAGNOSIS — Z79899 Other long term (current) drug therapy: Secondary | ICD-10-CM | POA: Diagnosis not present

## 2021-10-23 DIAGNOSIS — Z299 Encounter for prophylactic measures, unspecified: Secondary | ICD-10-CM | POA: Diagnosis not present

## 2021-10-23 DIAGNOSIS — Z125 Encounter for screening for malignant neoplasm of prostate: Secondary | ICD-10-CM | POA: Diagnosis not present

## 2021-10-27 ENCOUNTER — Inpatient Hospital Stay: Payer: Medicare HMO

## 2021-10-27 ENCOUNTER — Other Ambulatory Visit: Payer: Self-pay

## 2021-10-27 VITALS — BP 125/69 | HR 80 | Temp 98.1°F | Resp 16 | Ht 70.0 in | Wt 168.8 lb

## 2021-10-27 DIAGNOSIS — Z5112 Encounter for antineoplastic immunotherapy: Secondary | ICD-10-CM | POA: Diagnosis not present

## 2021-10-27 DIAGNOSIS — C3491 Malignant neoplasm of unspecified part of right bronchus or lung: Secondary | ICD-10-CM

## 2021-10-27 DIAGNOSIS — Z902 Acquired absence of lung [part of]: Secondary | ICD-10-CM | POA: Diagnosis not present

## 2021-10-27 DIAGNOSIS — C3411 Malignant neoplasm of upper lobe, right bronchus or lung: Secondary | ICD-10-CM | POA: Diagnosis not present

## 2021-10-27 LAB — CBC WITH DIFFERENTIAL (CANCER CENTER ONLY)
Abs Immature Granulocytes: 0.01 10*3/uL (ref 0.00–0.07)
Basophils Absolute: 0 10*3/uL (ref 0.0–0.1)
Basophils Relative: 1 %
Eosinophils Absolute: 0.3 10*3/uL (ref 0.0–0.5)
Eosinophils Relative: 8 %
HCT: 31 % — ABNORMAL LOW (ref 39.0–52.0)
Hemoglobin: 10.1 g/dL — ABNORMAL LOW (ref 13.0–17.0)
Immature Granulocytes: 0 %
Lymphocytes Relative: 24 %
Lymphs Abs: 0.9 10*3/uL (ref 0.7–4.0)
MCH: 32.7 pg (ref 26.0–34.0)
MCHC: 32.6 g/dL (ref 30.0–36.0)
MCV: 100.3 fL — ABNORMAL HIGH (ref 80.0–100.0)
Monocytes Absolute: 0.5 10*3/uL (ref 0.1–1.0)
Monocytes Relative: 13 %
Neutro Abs: 2 10*3/uL (ref 1.7–7.7)
Neutrophils Relative %: 54 %
Platelet Count: 164 10*3/uL (ref 150–400)
RBC: 3.09 MIL/uL — ABNORMAL LOW (ref 4.22–5.81)
RDW: 19.5 % — ABNORMAL HIGH (ref 11.5–15.5)
WBC Count: 3.6 10*3/uL — ABNORMAL LOW (ref 4.0–10.5)
nRBC: 0 % (ref 0.0–0.2)

## 2021-10-27 LAB — CMP (CANCER CENTER ONLY)
ALT: 9 U/L (ref 0–44)
AST: 14 U/L — ABNORMAL LOW (ref 15–41)
Albumin: 4 g/dL (ref 3.5–5.0)
Alkaline Phosphatase: 81 U/L (ref 38–126)
Anion gap: 5 (ref 5–15)
BUN: 17 mg/dL (ref 8–23)
CO2: 28 mmol/L (ref 22–32)
Calcium: 9.5 mg/dL (ref 8.9–10.3)
Chloride: 106 mmol/L (ref 98–111)
Creatinine: 1.19 mg/dL (ref 0.61–1.24)
GFR, Estimated: >60 mL/min (ref >60)
Glucose, Bld: 116 mg/dL — ABNORMAL HIGH (ref 70–99)
Potassium: 4 mmol/L (ref 3.5–5.1)
Sodium: 139 mmol/L (ref 135–145)
Total Bilirubin: 1.2 mg/dL (ref 0.3–1.2)
Total Protein: 6.8 g/dL (ref 6.5–8.1)

## 2021-10-27 MED ORDER — SODIUM CHLORIDE 0.9 % IV SOLN
1500.0000 mg | Freq: Once | INTRAVENOUS | Status: AC
  Administered 2021-10-27: 1500 mg via INTRAVENOUS
  Filled 2021-10-27: qty 30

## 2021-10-27 MED ORDER — SODIUM CHLORIDE 0.9 % IV SOLN: Freq: Once | INTRAVENOUS | Status: AC

## 2021-10-27 NOTE — Patient Instructions (Signed)
[name]  Discharge Instructions: Thank you for choosing [facility] to provide your oncology and hematology care.   If you have a lab appointment with the [facility], please go directly to the [facility] and check in at the registration area.   Wear comfortable clothing and clothing appropriate for easy access to any Portacath or PICC line.   We strive to give you quality time with your provider. You may need to reschedule your appointment if you arrive late (15 or more minutes).  Arriving late affects you and other patients whose appointments are after yours.  Also, if you miss three or more appointments without notifying the office, you may be dismissed from the clinic at the provider's discretion.      For prescription refill requests, have your pharmacy contact our office and allow 72 hours for refills to be completed.    Today you received the following chemotherapy and/or immunotherapy agent: Durvalumab (Imfinzi)   To help prevent nausea and vomiting after your treatment, we encourage you to take your nausea medication as directed.  BELOW ARE SYMPTOMS THAT SHOULD BE REPORTED IMMEDIATELY: *FEVER GREATER THAN 100.4 F (38 C) OR HIGHER *CHILLS OR SWEATING *NAUSEA AND VOMITING THAT IS NOT CONTROLLED WITH YOUR NAUSEA MEDICATION *UNUSUAL SHORTNESS OF BREATH *UNUSUAL BRUISING OR BLEEDING *URINARY PROBLEMS (pain or burning when urinating, or frequent urination) *BOWEL PROBLEMS (unusual diarrhea, constipation, pain near the anus) TENDERNESS IN MOUTH AND THROAT WITH OR WITHOUT PRESENCE OF ULCERS (sore throat, sores in mouth, or a toothache) UNUSUAL RASH, SWELLING OR PAIN  UNUSUAL VAGINAL DISCHARGE OR ITCHING   Items with * indicate a potential emergency and should be followed up as soon as possible or go to the Emergency Department if any problems should occur.  Please show the CHEMOTHERAPY ALERT CARD or IMMUNOTHERAPY ALERT CARD at  check-in to the Emergency Department and triage nurse.  Should you have questions after your visit or need to cancel or reschedule your appointment, please contact [facility]  Dept: [phone]  and follow the prompts.  Office hours are 8:00 a.m. to 4:30 p.m. Monday - Friday. Please note that voicemails left after 4:00 p.m. may not be returned until the following business day.  We are closed weekends and major holidays. You have access to a nurse at all times for urgent questions. Please call the main number to the clinic Dept: [phone] and follow the prompts.   For any non-urgent questions, you may also contact your provider using MyChart. We now offer e-Visits for anyone 5 and older to request care online for non-urgent symptoms. For details visit [email or web address].   Also download the MyChart app! Go to the app store, search "MyChart", open the app, select [facility], and log in with your MyChart username and password.  Masks are optional in the cancer centers. If you would like for your care team to wear a mask while they are taking care of you, please let them know. For doctor visits, patients may have with them one support person who is at least 79 years old. At this time, visitors are not allowed in the infusion area.  Durvalumab injection What is this medication? DURVALUMAB (dur VAL ue mab) is a monoclonal antibody. It is used to treat lung cancer. This medicine may be used for other purposes; ask your health care provider or pharmacist if you have questions. COMMON BRAND NAME(S): IMFINZI What should I tell my care team  before I take this medication? They need to know if you have any of these conditions: autoimmune diseases like Crohn's disease, ulcerative colitis, or lupus have had or planning to have an allogeneic stem cell transplant (uses someone else's stem cells) history of organ transplant history of radiation to the chest nervous  system problems like myasthenia gravis or Guillain-Barre syndrome an unusual or allergic reaction to durvalumab, other medicines, foods, dyes, or preservatives pregnant or trying to get pregnant breast-feeding How should I use this medication? This medicine is for infusion into a vein. It is given by a health care professional in a hospital or clinic setting. A special MedGuide will be given to you before each treatment. Be sure to read this information carefully each time. Talk to your pediatrician regarding the use of this medicine in children. Special care may be needed. Overdosage: If you think you have taken too much of this medicine contact a poison control center or emergency room at once. NOTE: This medicine is only for you. Do not share this medicine with others. What if I miss a dose? It is important not to miss your dose. Call your doctor or health care professional if you are unable to keep an appointment. What may interact with this medication? Interactions have not been studied. This list may not describe all possible interactions. Give your health care provider a list of all the medicines, herbs, non-prescription drugs, or dietary supplements you use. Also tell them if you smoke, drink alcohol, or use illegal drugs. Some items may interact with your medicine. What should I watch for while using this medication? This medication may make you feel generally unwell. Continue your course of treatment even though you feel ill unless your care team tells you to stop. You may need blood work done while you are taking this medication. Do not become pregnant while taking this medication or for 3 months after stopping it. Women should inform their care team if they wish to become pregnant or think they might be pregnant. There is a potential for serious side effects to an unborn child. Talk to your care team or pharmacist for more information. Do not breast-feed an infant while taking this  medication or for 3 months after stopping it. What side effects may I notice from receiving this medication? Side effects that you should report to your care team as soon as possible: Allergic reactions--skin rash, itching, hives, swelling of the face, lips, tongue, or throat Bloody or watery diarrhea Dizziness, loss of balance or coordination, confusion or trouble speaking Dry cough, shortness of breath or trouble breathing Flushing, mostly over the face, neck, and chest, during injection High blood sugar (hyperglycemia)--increased thirst or amount of urine, unusual weakness or fatigue, blurry vision High thyroid levels (hyperthyroidism)--fast or irregular heartbeat, weight loss, excessive sweating or sensitivity to heat, tremors or shaking, anxiety, nervousness, irregular menstrual cycle or spotting Infection--fever, chills, cough, or sore throat Liver injury--right upper belly pain, loss of appetite, nausea, light-colored stool, dark yellow or brown urine, yellowing skin or eyes, unusual weakness or fatigue Low adrenal gland function--nausea, vomiting, loss of appetite, unusual weakness or fatigue, dizziness, low blood pressure Low thyroid levels (hypothyroidism)--unusual weakness or fatigue, increased sensitivity to cold, constipation, hair loss, dry skin, weight gain, feelings of depression Pancreatitis--severe stomach pain that spreads to your back or gets worse after eating or when touched, fever, nausea, vomiting Rash, fever, and swollen lymph nodes Redness, blistering, peeling or loosening of the skin, including inside the mouth  Wheezing--trouble breathing with loud or whistling sounds Side effects that usually do not require medical attention (report these to your care team if they continue or are bothersome): Fatigue Hair loss This list may not describe all possible side effects. Call your doctor for medical advice about side effects. You may report side effects to FDA at  1-800-FDA-1088. Where should I keep my medication? This medication is given in a hospital or clinic. It will not be stored at home. NOTE: This sheet is a summary. It may not cover all possible information. If you have questions about this medicine, talk to your doctor, pharmacist, or health care provider.  2023 Elsevier/Gold Standard (2021-02-28 00:00:00)

## 2021-10-28 ENCOUNTER — Telehealth: Payer: Self-pay | Admitting: *Deleted

## 2021-10-28 NOTE — Telephone Encounter (Signed)
-----   Message from Lester Morven, RN sent at 10/27/2021  3:32 PM EDT ----- Regarding: Dr. Julien Nordmann Pt. first time Durvalumab. Tolerated treatment well, no issues noted.

## 2021-10-29 ENCOUNTER — Ambulatory Visit: Payer: Medicare HMO | Admitting: Student

## 2021-10-29 ENCOUNTER — Encounter: Payer: Self-pay | Admitting: Student

## 2021-10-29 VITALS — BP 126/66 | HR 79 | Temp 98.0°F | Resp 16 | Ht 70.0 in | Wt 169.0 lb

## 2021-10-29 DIAGNOSIS — Z951 Presence of aortocoronary bypass graft: Secondary | ICD-10-CM | POA: Diagnosis not present

## 2021-10-29 DIAGNOSIS — I2510 Atherosclerotic heart disease of native coronary artery without angina pectoris: Secondary | ICD-10-CM

## 2021-10-29 NOTE — Progress Notes (Signed)
Primary Physician/Referring:  [name], MD  Patient ID: Kevin [name], male    DOB: 1940/06/22, 81 y.o.   MRN: [id number]  Chief Complaint  Patient presents with   Hypotension   Chest Pain   Follow-up    4 week   HPI:    Kevin [name]  is a 81 y.o. Caucasian male with history of hyperlipidemia, 30-pack-year history of smoking quit in 1987, who was referred to our office for evaluation of chest pain and abnormal EKG.  Patient underwent cardiac catheterization 05/01/2021 revealing multivessel disease including critical left main disease, subsequently underwent CABG x3 on 05/13/2021.  During admission patient diagnosed with right upper lung adenocarcinoma, and underwent lung wedge resection. He is following with CT surgery and oncology for further management.    Patient was last seen in our office 10/01/2021 for urgent visit with concerns of syncopal episode which was likely related to hypotension, therefore reduce Lopressor to 12.5 mg p.o. twice daily and advised patient that if symptoms continued and blood pressure remains soft to discontinue Lopressor altogether.  Patient also complained of atypical chest pain symptoms, therefore added Ranexa 500 mg p.o. twice daily given underlying CAD.  He now presents for 4-week follow-up.  Patient never started Ranexa, however he has had no recurrence of chest pain.  Patient has had no recurrence of syncope, near syncope, or dizziness since reducing Lopressor to 12.5 mg p.o. twice daily.  Overall he is feeling well.  Past Medical History:  Diagnosis Date   Arthritis    Coronary artery disease    Hyperlipidemia    SCC (squamous cell carcinoma) 05/28/2004   top of left hand curet x3 62fu   SCC (squamous cell carcinoma) 09/13/2013   mid back tx cx3 19fu   SCC (squamous cell carcinoma) 09/06/2018   left wrist tx with bx   Squamous cell carcinoma of skin 11/16/2003   left ear tip tx curet and cautery 52fu   Social History   Tobacco Use   Smoking  status: Former    Packs/day: 1.00    Years: 30.00    Total pack years: 30.00    Types: Cigarettes    Quit date: 04/14/1995    Years since quitting: 26.5   Smokeless tobacco: Never  Substance Use Topics   Alcohol use: Never   Marital Status: Married  ROS  Review of Systems  Cardiovascular:  Negative for chest pain, dyspnea on exertion, leg swelling and syncope.  Neurological:  Negative for dizziness.   Objective  Blood pressure 126/66, pulse 79, temperature 98 F (36.7 C), resp. rate 16, height 5\' 10"  (1.778 m), weight 169 lb (76.7 kg), SpO2 99 %. Body mass index is 24.25 kg/m.     10/29/2021   11:39 AM 10/27/2021    1:03 PM 10/20/2021    2:16 PM  Vitals with BMI  Height 5\' 10"  5\' 10"    Weight 169 lbs 168 lbs 12 oz 170 lbs  BMI 24.25 28.31 51.76  Systolic 160 737 106  Diastolic 66 69 58  Pulse 79 80 77    Orthostatic VS for the past 72 hrs (Last 3 readings):  Orthostatic BP Patient Position BP Location Cuff Size Orthostatic Pulse  10/29/21 1146 123/59 Standing Left Arm Normal 77  10/29/21 1145 116/65 Sitting Left Arm Normal 66  10/29/21 1144 126/59 Supine Left Arm Normal 67    Physical Exam Vitals reviewed.  Neck:     Vascular: No carotid bruit or JVD.  Cardiovascular:  Rate and Rhythm: Normal rate and regular rhythm.     Pulses: Intact distal pulses.     Heart sounds: Normal heart sounds. No murmur heard.    No gallop.  Pulmonary:     Effort: Pulmonary effort is normal.     Breath sounds: Normal breath sounds.  Musculoskeletal:     Right lower leg: No edema.     Left lower leg: No edema.   Physical exam unchanged compared to previous office visit.  Laboratory examination:      Latest Ref Rng & Units 10/27/2021   12:46 PM 10/16/2021    9:47 AM 09/29/2021    1:32 PM  CMP  Glucose 70 - 99 mg/dL 116  92  113   BUN 8 - 23 mg/dL 17  16  19    Creatinine 0.61 - 1.24 mg/dL 1.19  1.31  1.12   Sodium 135 - 145 mmol/L 139  139  136   Potassium 3.5 - 5.1 mmol/L 4.0   4.3  4.6   Chloride 98 - 111 mmol/L 106  105  105   CO2 22 - 32 mmol/L 28  28  26    Calcium 8.9 - 10.3 mg/dL 9.5  9.3  9.2   Total Protein 6.5 - 8.1 g/dL 6.8  6.4    Total Bilirubin 0.3 - 1.2 mg/dL 1.2  1.1    Alkaline Phos 38 - 126 U/L 81  77    AST 15 - 41 U/L 14  14    ALT 0 - 44 U/L 9  10        Latest Ref Rng & Units 10/27/2021   12:46 PM 10/16/2021    9:47 AM 09/29/2021    1:32 PM  CBC  WBC 4.0 - 10.5 K/uL 3.6  3.9  2.4   Hemoglobin 13.0 - 17.0 g/dL 10.1  9.1  9.3   Hematocrit 39.0 - 52.0 % 31.0  27.5  28.4   Platelets 150 - 400 K/uL 164  133  169     Lipid Panel     Component Value Date/Time   LDLDIRECT 80 05/01/2021 1146   HEMOGLOBIN A1C Lab Results  Component Value Date   HGBA1C 5.2 05/13/2021   MPG 102.54 05/13/2021   TSH Recent Labs    05/07/21 0915  TSH 3.714   External labs:   07/05/2020: Total cholesterol 149, triglycerides 134, HDL 40, LDL 85.  Allergies  No Known Allergies    Medications Prior to Visit:   Outpatient Medications Prior to Visit  Medication Sig Dispense Refill   aspirin EC 81 MG tablet Take 81 mg by mouth daily.     atorvastatin (LIPITOR) 80 MG tablet Take 1 tablet (80 mg total) by mouth daily. 90 tablet 3   Calcium Carb-Cholecalciferol (CALCIUM 600 + D PO) Take 1,200 mg by mouth daily.     Cholecalciferol (DIALYVITE VITAMIN D 5000) 125 MCG (5000 UT) capsule Take 5,000 Units by mouth daily.     clopidogrel (PLAVIX) 75 MG tablet Take 1 tablet (75 mg total) by mouth daily. Okay to restart this medication on 07/23/2021 90 tablet 2   metoprolol tartrate (LOPRESSOR) 25 MG tablet Take 0.5 tablets (12.5 mg total) by mouth 2 (two) times daily. 180 tablet 3   nitroGLYCERIN (NITROSTAT) 0.4 MG SL tablet Place 0.4 mg under the tongue every 5 (five) minutes as needed for chest pain.     No facility-administered medications prior to visit.   Final Medications at End of Visit  Current Meds  Medication Sig   aspirin EC 81 MG tablet Take 81  mg by mouth daily.   atorvastatin (LIPITOR) 80 MG tablet Take 1 tablet (80 mg total) by mouth daily.   Calcium Carb-Cholecalciferol (CALCIUM 600 + D PO) Take 1,200 mg by mouth daily.   Cholecalciferol (DIALYVITE VITAMIN D 5000) 125 MCG (5000 UT) capsule Take 5,000 Units by mouth daily.   clopidogrel (PLAVIX) 75 MG tablet Take 1 tablet (75 mg total) by mouth daily. Okay to restart this medication on 07/23/2021   metoprolol tartrate (LOPRESSOR) 25 MG tablet Take 0.5 tablets (12.5 mg total) by mouth 2 (two) times daily.   nitroGLYCERIN (NITROSTAT) 0.4 MG SL tablet Place 0.4 mg under the tongue every 5 (five) minutes as needed for chest pain.   Radiology:   CT chest 05/07/2021: 1. Right upper lobe spiculated lesions consistent with malignancy. Multidisciplinary consult is advised. 2. No evidence of metastatic disease. 3. Aortic Atherosclerosis (ICD10-I70.0) and Emphysema (ICD10-J43.9).  Chest x-ray 05/16/2021: 1. Post removal of chest support tubes and RIGHT IJ central venous line. No visible pneumothorax. 2. Improved aeration in the chest with trace LEFT effusion. 3. Diminished opacity in the RIGHT upper lobe adjacent to surgical changes at the site of previous mass may reflect resolving pleural fluid and or atelectasis in this area, close attention on follow-up.  Cardiac Studies:   Left Heart Catheterization 05/06/21:  LV: 126/4, EDP 11 mmHg.  Ao 123/46, mean 33 mmHg.  No pressure gradient across aortic valve. LVEF: 40 to 45% with global hypokinesis.  LV is mildly dilated. LM: High-grade, complex, calcified 95% distal left main stenosis. LAD: Proximal segment mild to moderately calcified.  Proximal to mid 60% calcific stenosis.  Gives origin to 3 large diagonals and a fourth large diagonal from the mid to distal segment, mild disease.  No significant calcification mid to distal LAD. CX: Gives origin to high OM1 which is got mild disease, after that there is a 60% calcific focal stenosis.  Mid to  distal circumflex is large, dominant with very mild noncalcific disease. RCA: Nondominant, normal.   Echocardiogram 05/07/2021:  1. Left ventricular ejection fraction, by estimation, is 40 to 45%. The left ventricle has mildly decreased function. The left ventricle demonstrates global hypokinesis. Left ventricular diastolic parameters were normal.   2. Right ventricular systolic function is normal. The right ventricular size is normal.   3. The mitral valve is normal in structure. Trivial mitral valve regurgitation. No evidence of mitral stenosis.   4. The aortic valve is normal in structure. Aortic valve regurgitation is not visualized. No aortic stenosis is present.  CABG x3 05/13/2021:  LIMA to LAD, reverse SVG to OM- 1 and left PDA. Core needle biopsy of the right upper lobe mass unsuccessful and hence underwent right upper lobe wedge resection  EKG:  10/01/2021: Sinus rhythm with single PVC at a rate of 70 bpm.  Normal axis.  Poor R wave progression, cannot exclude anteroseptal infarct old.  Nonspecific ST-T wave abnormality, less prominent compared to previous.   EKG 05/28/2021: Normal sinus rhythm at rate of 68 bpm, normal axis, LVH with repolarization abnormality, cannot exclude inferior lateral ischemia.  PAC (3).   Assessment     ICD-10-CM   1. Coronary artery disease involving native coronary artery of native heart without angina pectoris  I25.10     2. CABG x3 05/13/2021: LIMA to LAD, reverse SVG to OM- 1 and left PDA.  Z95.1  There are no discontinued medications.    No orders of the defined types were placed in this encounter.  No orders of the defined types were placed in this encounter.   Recommendations:   Kevin [name] is a 81 y.o. Caucasian male with history of hyperlipidemia, 30-pack-year history of smoking quit in 1987, who was referred to our office for evaluation of chest pain and abnormal EKG.  Patient underwent cardiac catheterization 05/01/2021 revealing  multivessel disease including critical left main disease, subsequently underwent CABG x3 on 05/13/2021.  During admission patient diagnosed with right upper lung adenocarcinoma, and underwent lung wedge resection. He is following with CT surgery and oncology for further management.    Patient was last seen in our office 10/01/2021 for urgent visit with concerns of syncopal episode which was likely related to hypotension, therefore reduce Lopressor to 12.5 mg p.o. twice daily and advised patient that if symptoms continued and blood pressure remains soft to discontinue Lopressor altogether.  Patient also complained of atypical chest pain symptoms, therefore added Ranexa 500 mg p.o. twice daily given underlying CAD.  He now presents for 4-week follow-up.  She never started Ranexa, however he has had no recurrence of chest pain.  We will therefore hold off on Ranexa at this time.  He has had no recurrence of syncope, near syncope, or dizziness since reducing Lopressor to 12.5 mg p.o. twice daily, will continue this.  Blood pressure stable at today's office visit.  Patient will follow-up as previously scheduled.   Alethia Berthold, PA-C 10/29/2021, 12:23 PM Office: [phone]

## 2021-10-30 ENCOUNTER — Telehealth: Payer: Self-pay | Admitting: Internal Medicine

## 2021-10-30 NOTE — Telephone Encounter (Signed)
Called patient regarding upcoming August, September and October appointments. Patient has been called and notified.

## 2021-11-02 ENCOUNTER — Encounter: Payer: Self-pay | Admitting: Dermatology

## 2021-11-02 NOTE — Progress Notes (Signed)
   Follow-Up Visit   Subjective  Kevin [name] is a 81 y.o. male who presents for the following: Annual Exam (Yearly skin check with personal history of scc and ka ).  Annual skin examination Location:  Duration:  Quality:  Associated Signs/Symptoms: Modifying Factors:  Severity:  Timing: Context:   Objective  Well appearing patient in no apparent distress; mood and affect are within normal limits. General skin examination: No atypical pigmented lesions (all checked with dermoscopy).,  No new or recurrent nonmelanoma skin cancer  Mid Back Noninflamed 1 cm deep dermal nodule  Mid Back Several 1 mm smooth red dermal papules  Mid Back Reticulate hyperpigmentation and erythema    All skin waist up examined.   Assessment & Plan    Screening for malignant neoplasm of skin  Annual skin examination  Epidermal cyst Mid Back  No intervention necessary  Hemangioma of skin Mid Back  No intervention necessary  Radiation dermatitis Mid Back  No treatment initiated      I, Lavonna Monarch, MD, have reviewed all documentation for this visit.  The documentation on 11/02/21 for the exam, diagnosis, procedures, and orders are all accurate and complete.

## 2021-11-03 ENCOUNTER — Other Ambulatory Visit: Payer: Self-pay

## 2021-11-10 ENCOUNTER — Ambulatory Visit
Admission: RE | Admit: 2021-11-10 | Discharge: 2021-11-10 | Disposition: A | Discharge status: Home / Self Care | Payer: Medicare HMO | Source: Ambulatory Visit | Attending: Radiation Oncology | Admitting: Radiation Oncology

## 2021-11-10 DIAGNOSIS — R918 Other nonspecific abnormal finding of lung field: Secondary | ICD-10-CM | POA: Insufficient documentation

## 2021-11-10 DIAGNOSIS — I10 Essential (primary) hypertension: Secondary | ICD-10-CM | POA: Diagnosis not present

## 2021-11-10 DIAGNOSIS — C3491 Malignant neoplasm of unspecified part of right bronchus or lung: Secondary | ICD-10-CM | POA: Insufficient documentation

## 2021-11-10 DIAGNOSIS — Z299 Encounter for prophylactic measures, unspecified: Secondary | ICD-10-CM | POA: Diagnosis not present

## 2021-11-10 DIAGNOSIS — H6120 Impacted cerumen, unspecified ear: Secondary | ICD-10-CM | POA: Diagnosis not present

## 2021-11-10 NOTE — Progress Notes (Signed)
History of Present Illness:   Here for bladder cancer surveillance.    4.24.2017: TURBT of a 2 cm low-grade nonmuscle invasive bladder tumor by Dr. Exie Parody.  Repeat resection for recurrence in August 2017.  Mitomycin was placed after each procedure.   2.6.2020: Repeat TURBT/gemcitabine for 15 mm right posterior bladder wall lesion, low-grade nonmuscle invasive on pathology review.   5.19.2019:he underwent UroLift for BPH symptoms.  6 implants were placed.   10.25.2022: Now back on tamsulosin because of nocturia x3-4.  No gross hematuria.  Tamsulosin has improved his nighttime voiding.  He does drink coffee in the evening.   5.23.2023: Here for questions.  He was recently diagnosed with severe CAD, underwent urgent bypass by Dr. Kipp Brood.  Part of the evaluation included preoperative chest CT which revealed probable carcinoma of the right upper lobe.  He had a resection at the same time.  He has stage III adenocarcinoma.  He is getting chemo/radiotherapy now.  ` He is not currently on tamsulosin, voiding fairly well.  Past Medical History:  Diagnosis Date   Arthritis    Coronary artery disease    Hyperlipidemia    SCC (squamous cell carcinoma) 05/28/2004   top of left hand curet x3 62fu   SCC (squamous cell carcinoma) 09/13/2013   mid back tx cx3 37fu   SCC (squamous cell carcinoma) 09/06/2018   left wrist tx with bx   Squamous cell carcinoma of skin 11/16/2003   left ear tip tx curet and cautery 29fu    Past Surgical History:  Procedure Laterality Date   BRONCHIAL BIOPSY  07/22/2021   Procedure: BRONCHIAL BIOPSIES;  Surgeon: Collene Gobble, MD;  Location: [place];  Service: Cardiopulmonary;;   BRONCHIAL BRUSHINGS  07/22/2021   Procedure: BRONCHIAL BRUSHINGS;  Surgeon: Collene Gobble, MD;  Location: [place];  Service: Cardiopulmonary;;   BRONCHIAL NEEDLE ASPIRATION BIOPSY  07/22/2021   Procedure: BRONCHIAL NEEDLE ASPIRATION BIOPSIES;  Surgeon: Collene Gobble, MD;  Location:  [place];  Service: Cardiopulmonary;;   CORONARY ARTERY BYPASS GRAFT N/A 05/13/2021   Procedure: CORONARY ARTERY BYPASS GRAFTING (CABG) TIMES 3  , ON PUMP, USING LEFT INTERNAL MAMMARY ARTERY AND RIGHT GREATER SAPHENOUS VEIN HARVESTED ENDOSCOPICALLY;  Surgeon: Lajuana Matte, MD;  Location: [place];  Service: Open Heart Surgery;  Laterality: N/A;   ENDOVEIN HARVEST OF GREATER SAPHENOUS VEIN Right 05/13/2021   Procedure: ENDOVEIN HARVEST OF GREATER SAPHENOUS VEIN;  Surgeon: Lajuana Matte, MD;  Location: [place];  Service: Open Heart Surgery;  Laterality: Right;   epidural     back pain   IR CT SPINE LTD  06/18/2020   IR KYPHO LUMBAR INC FX REDUCE BONE BX UNI/BIL CANNULATION INC/IMAGING  06/18/2020   LEFT HEART CATH AND CORONARY ANGIOGRAPHY N/A 05/06/2021   Procedure: LEFT HEART CATH AND CORONARY ANGIOGRAPHY;  Surgeon: [name], MD;  Location: [place] [facility];  Service: Cardiovascular;  Laterality: N/A;   LUNG BIOPSY Right 05/13/2021   Procedure: RIGHT LUNG BIOPSY;  Surgeon: Lajuana Matte, MD;  Location: [place];  Service: Open Heart Surgery;  Laterality: Right;   MINOR HEMORRHOIDECTOMY     SHOULDER SURGERY     TEE WITHOUT CARDIOVERSION N/A 05/13/2021   Procedure: TRANSESOPHAGEAL ECHOCARDIOGRAM (TEE);  Surgeon: Lajuana Matte, MD;  Location: [facility];  Service: Open Heart Surgery;  Laterality: N/A;   TRANSURETHRAL RESECTION OF BLADDER TUMOR  2014   TRANSURETHRAL RESECTION OF BLADDER TUMOR N/A 05/19/2018   Procedure: TRANSURETHRAL RESECTION OF BLADDER  TUMOR (TURBT) AND (INTRAVESICAL GEMCITABINE INSTILLED IN BLADDER IN PACU) ;  Surgeon: Franchot Gallo, MD;  Location: [facility];  Service: Urology;  Laterality: N/A;  [place] Bilateral 07/22/2021   Procedure: VIDEO BRONCHOSCOPY WITH ENDOBRONCHIAL ULTRASOUND;  Surgeon: Collene Gobble, MD;  Location: [facility];  Service: Cardiopulmonary;  Laterality: Bilateral;    Home Medications:   Allergies as of 11/11/2021   No Known Allergies      Medication List        Accurate as of November 10, 2021  7:50 PM. If you have any questions, ask your nurse or doctor.          aspirin EC 81 MG tablet Take 81 mg by mouth daily.   atorvastatin 80 MG tablet Commonly known as: LIPITOR Take 1 tablet (80 mg total) by mouth daily.   CALCIUM 600 + D PO Take 1,200 mg by mouth daily.   clopidogrel 75 MG tablet Commonly known as: Plavix Take 1 tablet (75 mg total) by mouth daily. Okay to restart this medication on 07/23/2021   Dialyvite Vitamin D 5000 125 MCG (5000 UT) capsule Generic drug: Cholecalciferol Take 5,000 Units by mouth daily.   metoprolol tartrate 25 MG tablet Commonly known as: LOPRESSOR Take 0.5 tablets (12.5 mg total) by mouth 2 (two) times daily.   nitroGLYCERIN 0.4 MG SL tablet Commonly known as: NITROSTAT Place 0.4 mg under the tongue every 5 (five) minutes as needed for chest pain.        Allergies: No Known Allergies  Family History  Problem Relation Age of Onset   Alzheimer's disease Mother 75   Congestive Heart Failure Father 18   Heart disease Brother 23   Heart failure Brother 83   Heart failure Brother 12    Social History:  reports that he quit smoking about 26 years ago. His smoking use included cigarettes. He has a 30.00 pack-year smoking history. He has never used smokeless tobacco. He reports that he does not drink alcohol and does not use drugs.  ROS: A complete review of systems was performed.  All systems are negative except for pertinent findings as noted.  Physical Exam:  Vital signs in last 24 hours: There were no vitals taken for this visit. Constitutional:  Alert and oriented, No acute distress Cardiovascular: Regular rate  Respiratory: Normal respiratory effort GI: Abdomen is soft, nontender, nondistended, no abdominal masses. No CVAT.  Genitourinary: Normal male phallus, testes are descended bilaterally and non-tender  and without masses, scrotum is normal in appearance without lesions or masses, perineum is normal on inspection. Lymphatic: No lymphadenopathy Neurologic: Grossly intact, no focal deficits Psychiatric: Normal mood and affect  I have reviewed prior pt notes  I have reviewed notes from referring/previous physicians  I have reviewed urinalysis results  I have independently reviewed prior imaging  I have reviewed prior PSA results    Impression/Assessment:  ***  Plan:  ***

## 2021-11-11 ENCOUNTER — Ambulatory Visit (INDEPENDENT_AMBULATORY_CARE_PROVIDER_SITE_OTHER): Payer: Medicare HMO | Admitting: Urology

## 2021-11-11 ENCOUNTER — Encounter: Payer: Self-pay | Admitting: Urology

## 2021-11-11 VITALS — BP 132/71 | HR 70 | Ht 70.0 in | Wt 167.0 lb

## 2021-11-11 DIAGNOSIS — Z8551 Personal history of malignant neoplasm of bladder: Secondary | ICD-10-CM | POA: Diagnosis not present

## 2021-11-11 DIAGNOSIS — N138 Other obstructive and reflux uropathy: Secondary | ICD-10-CM | POA: Diagnosis not present

## 2021-11-11 DIAGNOSIS — Z8546 Personal history of malignant neoplasm of prostate: Secondary | ICD-10-CM | POA: Diagnosis not present

## 2021-11-11 DIAGNOSIS — R310 Gross hematuria: Secondary | ICD-10-CM

## 2021-11-11 DIAGNOSIS — N401 Enlarged prostate with lower urinary tract symptoms: Secondary | ICD-10-CM | POA: Diagnosis not present

## 2021-11-11 DIAGNOSIS — R351 Nocturia: Secondary | ICD-10-CM | POA: Diagnosis not present

## 2021-11-11 DIAGNOSIS — N210 Calculus in bladder: Secondary | ICD-10-CM | POA: Diagnosis not present

## 2021-11-11 LAB — URINALYSIS, ROUTINE W REFLEX MICROSCOPIC
Bilirubin, UA: NEGATIVE
Glucose, UA: NEGATIVE
Leukocytes,UA: NEGATIVE
Nitrite, UA: NEGATIVE
Specific Gravity, UA: 1.02 (ref 1.005–1.030)
Urobilinogen, Ur: 1 mg/dL (ref 0.2–1.0)
pH, UA: 5.5 (ref 5.0–7.5)

## 2021-11-11 LAB — MICROSCOPIC EXAMINATION
Epithelial Cells (non renal): NONE SEEN /hpf (ref 0–10)
RBC, Urine: >30 /hpf — AB (ref 0–2)
Renal Epithel, UA: NONE SEEN /hpf
WBC, UA: NONE SEEN /hpf (ref 0–5)

## 2021-11-20 NOTE — Progress Notes (Signed)
  Radiation Oncology         [phone]) [phone] ________________________________  Name: Kevin [name] MRN: [id number]  Date of Service: 11/10/2021  DOB: Sep 09, 1940  Post Treatment Telephone Note  Diagnosis:   Stage IIIC (T3, N3, M0) non-small cell lung cancer, adenocarcinoma presented with right upper lobe lung mass status post right upper lobe wedge resection but no lymph node dissection.  Intent: Curative  Radiation Treatment Dates: 08/11/2021 through 09/25/2021 Site Technique Total Dose (Gy) Dose per Fx (Gy) Completed Fx Beam Energies  Lung, Right: Lung_R 3D 60/60 2 30/30 6X, 10X  Lung, Right: Lung_R_Bst 3D 6/6 2 3/3 6X, 10X   Narrative: The patient tolerated radiation therapy relatively well. He developed fatigue and anticipated skin changes in the treatment field but significant esophagitis that was difficult to manage despite carafate, OTC antacids, viscous lidocaine, and suspension narcotics. His taste has changed per his wife. His swallowing has improved greater.    Impression/Plan: 1. Stage IIIC (T3, N3, M0) non-small cell lung cancer, adenocarcinoma presented with right upper lobe lung mass status post right upper lobe wedge resection but no lymph node dissection.. The patient has been doing well since completion of radiotherapy. We discussed that we would be happy to continue to follow him as needed, but he will also continue to follow up with Dr. Julien Nordmann in medical oncology.      Carola Rhine, PAC

## 2021-11-25 ENCOUNTER — Inpatient Hospital Stay: Payer: Medicare HMO

## 2021-11-25 ENCOUNTER — Inpatient Hospital Stay: Payer: Medicare HMO | Attending: Internal Medicine | Admitting: Internal Medicine

## 2021-11-25 ENCOUNTER — Other Ambulatory Visit: Payer: Self-pay | Admitting: Internal Medicine

## 2021-11-25 ENCOUNTER — Other Ambulatory Visit: Payer: Self-pay

## 2021-11-25 ENCOUNTER — Encounter: Payer: Self-pay | Admitting: Internal Medicine

## 2021-11-25 VITALS — BP 120/67 | HR 73 | Temp 97.8°F | Resp 15 | Ht 70.0 in | Wt 170.9 lb

## 2021-11-25 VITALS — BP 140/64 | HR 68 | Resp 17

## 2021-11-25 DIAGNOSIS — C3491 Malignant neoplasm of unspecified part of right bronchus or lung: Secondary | ICD-10-CM

## 2021-11-25 DIAGNOSIS — Z5112 Encounter for antineoplastic immunotherapy: Secondary | ICD-10-CM | POA: Diagnosis not present

## 2021-11-25 DIAGNOSIS — C3411 Malignant neoplasm of upper lobe, right bronchus or lung: Secondary | ICD-10-CM | POA: Diagnosis not present

## 2021-11-25 LAB — CMP (CANCER CENTER ONLY)
ALT: 9 U/L (ref 0–44)
AST: 15 U/L (ref 15–41)
Albumin: 3.8 g/dL (ref 3.5–5.0)
Alkaline Phosphatase: 66 U/L (ref 38–126)
Anion gap: 8 (ref 5–15)
BUN: 18 mg/dL (ref 8–23)
CO2: 24 mmol/L (ref 22–32)
Calcium: 9.1 mg/dL (ref 8.9–10.3)
Chloride: 107 mmol/L (ref 98–111)
Creatinine: 1.42 mg/dL — ABNORMAL HIGH (ref 0.61–1.24)
GFR, Estimated: 51 mL/min — ABNORMAL LOW (ref >60)
Glucose, Bld: 98 mg/dL (ref 70–99)
Potassium: 4 mmol/L (ref 3.5–5.1)
Sodium: 139 mmol/L (ref 135–145)
Total Bilirubin: 0.9 mg/dL (ref 0.3–1.2)
Total Protein: 6.7 g/dL (ref 6.5–8.1)

## 2021-11-25 LAB — CBC WITH DIFFERENTIAL (CANCER CENTER ONLY)
Abs Immature Granulocytes: 0.01 10*3/uL (ref 0.00–0.07)
Basophils Absolute: 0 10*3/uL (ref 0.0–0.1)
Basophils Relative: 0 %
Eosinophils Absolute: 0.3 10*3/uL (ref 0.0–0.5)
Eosinophils Relative: 5 %
HCT: 31.7 % — ABNORMAL LOW (ref 39.0–52.0)
Hemoglobin: 10.7 g/dL — ABNORMAL LOW (ref 13.0–17.0)
Immature Granulocytes: 0 %
Lymphocytes Relative: 17 %
Lymphs Abs: 0.8 10*3/uL (ref 0.7–4.0)
MCH: 32.9 pg (ref 26.0–34.0)
MCHC: 33.8 g/dL (ref 30.0–36.0)
MCV: 97.5 fL (ref 80.0–100.0)
Monocytes Absolute: 0.6 10*3/uL (ref 0.1–1.0)
Monocytes Relative: 12 %
Neutro Abs: 3 10*3/uL (ref 1.7–7.7)
Neutrophils Relative %: 66 %
Platelet Count: 146 10*3/uL — ABNORMAL LOW (ref 150–400)
RBC: 3.25 MIL/uL — ABNORMAL LOW (ref 4.22–5.81)
RDW: 13.2 % (ref 11.5–15.5)
WBC Count: 4.6 10*3/uL (ref 4.0–10.5)
nRBC: 0 % (ref 0.0–0.2)

## 2021-11-25 MED ORDER — SODIUM CHLORIDE 0.9 % IV SOLN: Freq: Once | INTRAVENOUS | Status: AC

## 2021-11-25 MED ORDER — SODIUM CHLORIDE 0.9 % IV SOLN
1500.0000 mg | Freq: Once | INTRAVENOUS | Status: AC
  Administered 2021-11-25: 1500 mg via INTRAVENOUS
  Filled 2021-11-25: qty 30

## 2021-11-25 NOTE — Progress Notes (Signed)
[place] [phone]   [phone]  OFFICE PROGRESS NOTE  [name], MD [place] [address]  DIAGNOSIS: Stage IIIC (T3, N3, M0) non-small cell lung cancer, adenocarcinoma presented with right upper lobe lung mass status post right upper lobe wedge resection but no lymph node dissection.  The tumor measured 3.3 cm with visceral pleural involvement and very close resection margin less than 0.1 cm from the tumor.  This was performed on May 13, 2021 in the same setting of CABG under the care of Dr. Kipp Brood.  PET scan performed in March 2023 showed a right upper lobe nodule adjacent to the wedge resection site small hypermetabolic right hilar, right subcarinal, and left infrahilar lymph nodes which could be reactive but are suspicious for nodal involvement.   Molecular studies showed no actionable mutations and PD-L1 expression was 0%   PRIOR THERAPY:  1) Right upper lobe wedge resection under the care of Dr. Kipp Brood on 05/13/2021. 2) Concurrent chemoradiation with weekly carboplatin for AUC of 2 and paclitaxel 45 Mg/M2.  First dose Aug 11, 2021.  Status post 6 cycles.  Last dose was given September 22, 2021   CURRENT THERAPY: Consolidation treatment with immunotherapy with Imfinzi 1500 Mg IV every 4 weeks.  First dose October 27, 2021.  Status post 1 cycle.  INTERVAL HISTORY: Kevin [name] 81 y.o. male returns to the clinic today for follow-up visit accompanied by his wife.  The patient is feeling fine today with no concerning complaints.  He tolerated the first cycle of his consolidation treatment with immunotherapy fairly well.  He denied having any chest pain, shortness of breath, cough or hemoptysis.  He has no nausea, vomiting, diarrhea or constipation.  He has no headache or visual changes.  He denied having any recent weight loss or night sweats.  He is here today for evaluation before starting cycle #2.    MEDICAL HISTORY: Past  Medical History:  Diagnosis Date   Arthritis    Coronary artery disease    Hyperlipidemia    SCC (squamous cell carcinoma) 05/28/2004   top of left hand curet x3 31f   SCC (squamous cell carcinoma) 09/13/2013   mid back tx cx3 541f  SCC (squamous cell carcinoma) 09/06/2018   left wrist tx with bx   Squamous cell carcinoma of skin 11/16/2003   left ear tip tx curet and cautery 5f30f  ALLERGIES:  has No Known Allergies.  MEDICATIONS:  Current Outpatient Medications  Medication Sig Dispense Refill   aspirin EC 81 MG tablet Take 81 mg by mouth daily.     atorvastatin (LIPITOR) 80 MG tablet Take 1 tablet (80 mg total) by mouth daily. 90 tablet 3   Calcium Carb-Cholecalciferol (CALCIUM 600 + D PO) Take 1,200 mg by mouth daily.     Cholecalciferol (DIALYVITE VITAMIN D 5000) 125 MCG (5000 UT) capsule Take 5,000 Units by mouth daily.     clopidogrel (PLAVIX) 75 MG tablet Take 1 tablet (75 mg total) by mouth daily. Okay to restart this medication on 07/23/2021 90 tablet 2   metoprolol tartrate (LOPRESSOR) 25 MG tablet Take 0.5 tablets (12.5 mg total) by mouth 2 (two) times daily. 180 tablet 3   nitroGLYCERIN (NITROSTAT) 0.4 MG SL tablet Place 0.4 mg under the tongue every 5 (five) minutes as needed for chest pain.     tamsulosin (FLOMAX) 0.4 MG CAPS capsule Take 0.4 mg by mouth daily.  No current facility-administered medications for this visit.    SURGICAL HISTORY:  Past Surgical History:  Procedure Laterality Date   BRONCHIAL BIOPSY  07/22/2021   Procedure: BRONCHIAL BIOPSIES;  Surgeon: Collene Gobble, MD;  Location: [place];  Service: Cardiopulmonary;;   BRONCHIAL BRUSHINGS  07/22/2021   Procedure: BRONCHIAL BRUSHINGS;  Surgeon: Collene Gobble, MD;  Location: [place];  Service: Cardiopulmonary;;   BRONCHIAL NEEDLE ASPIRATION BIOPSY  07/22/2021   Procedure: BRONCHIAL NEEDLE ASPIRATION BIOPSIES;  Surgeon: Collene Gobble, MD;  Location: [place];  Service:  Cardiopulmonary;;   CORONARY ARTERY BYPASS GRAFT N/A 05/13/2021   Procedure: CORONARY ARTERY BYPASS GRAFTING (CABG) TIMES 3  , ON PUMP, USING LEFT INTERNAL MAMMARY ARTERY AND RIGHT GREATER SAPHENOUS VEIN HARVESTED ENDOSCOPICALLY;  Surgeon: Lajuana Matte, MD;  Location: [place];  Service: Open Heart Surgery;  Laterality: N/A;   ENDOVEIN HARVEST OF GREATER SAPHENOUS VEIN Right 05/13/2021   Procedure: ENDOVEIN HARVEST OF GREATER SAPHENOUS VEIN;  Surgeon: Lajuana Matte, MD;  Location: [facility];  Service: Open Heart Surgery;  Laterality: Right;   epidural     back pain   IR CT SPINE LTD  06/18/2020   IR KYPHO LUMBAR INC FX REDUCE BONE BX UNI/BIL CANNULATION INC/IMAGING  06/18/2020   LEFT HEART CATH AND CORONARY ANGIOGRAPHY N/A 05/06/2021   Procedure: LEFT HEART CATH AND CORONARY ANGIOGRAPHY;  Surgeon: [name], MD;  Location: [place] [facility];  Service: Cardiovascular;  Laterality: N/A;   LUNG BIOPSY Right 05/13/2021   Procedure: RIGHT LUNG BIOPSY;  Surgeon: Lajuana Matte, MD;  Location: [place];  Service: Open Heart Surgery;  Laterality: Right;   MINOR HEMORRHOIDECTOMY     SHOULDER SURGERY     TEE WITHOUT CARDIOVERSION N/A 05/13/2021   Procedure: TRANSESOPHAGEAL ECHOCARDIOGRAM (TEE);  Surgeon: Lajuana Matte, MD;  Location: [place];  Service: Open Heart Surgery;  Laterality: N/A;   TRANSURETHRAL RESECTION OF BLADDER TUMOR  2014   TRANSURETHRAL RESECTION OF BLADDER TUMOR N/A 05/19/2018   Procedure: TRANSURETHRAL RESECTION OF BLADDER TUMOR (TURBT) AND (INTRAVESICAL GEMCITABINE INSTILLED IN BLADDER IN PACU) ;  Surgeon: Franchot Gallo, MD;  Location: [facility];  Service: Urology;  Laterality: N/A;  [place] Bilateral 07/22/2021   Procedure: VIDEO BRONCHOSCOPY WITH ENDOBRONCHIAL ULTRASOUND;  Surgeon: Collene Gobble, MD;  Location: [facility];  Service: Cardiopulmonary;  Laterality: Bilateral;    REVIEW OF SYSTEMS:  A comprehensive review  of systems was negative except for: Constitutional: positive for fatigue   PHYSICAL EXAMINATION: General appearance: alert, cooperative, fatigued, and no distress Head: Normocephalic, without obvious abnormality, atraumatic Neck: no adenopathy, no JVD, supple, symmetrical, trachea midline, and thyroid not enlarged, symmetric, no tenderness/mass/nodules Lymph nodes: Cervical, supraclavicular, and axillary nodes normal. Resp: clear to auscultation bilaterally Back: symmetric, no curvature. ROM normal. No CVA tenderness. Cardio: regular rate and rhythm, S1, S2 normal, no murmur, click, rub or gallop GI: soft, non-tender; bowel sounds normal; no masses,  no organomegaly Extremities: extremities normal, atraumatic, no cyanosis or edema  ECOG PERFORMANCE STATUS: 1 - Symptomatic but completely ambulatory  Blood pressure 120/67, pulse 73, temperature 97.8 F (36.6 C), temperature source Oral, resp. rate 15, height _0  (1.778 m), weight 170 lb 14.4 oz (77.5 kg), SpO2 99 %.  LABORATORY DATA: Lab Results  Component Value Date   WBC 4.6 11/25/2021   HGB 10.7 (L) 11/25/2021   HCT 31.7 (L) 11/25/2021   MCV 97.5 11/25/2021   PLT  146 (L) 11/25/2021      Chemistry      Component Value Date/Time   NA 139 10/27/2021 1246   NA 139 05/01/2021 1146   K 4.0 10/27/2021 1246   CL 106 10/27/2021 1246   CO2 28 10/27/2021 1246   BUN 17 10/27/2021 1246   BUN 20 05/01/2021 1146   CREATININE 1.19 10/27/2021 1246      Component Value Date/Time   CALCIUM 9.5 10/27/2021 1246   ALKPHOS 81 10/27/2021 1246   AST 14 (L) 10/27/2021 1246   ALT 9 10/27/2021 1246   BILITOT 1.2 10/27/2021 1246       RADIOGRAPHIC STUDIES: No results found.  ASSESSMENT AND PLAN: This is a very pleasant 81 years old white male diagnosed with stage IIIC (T3, N3, M0) non-small cell lung cancer, adenocarcinoma with no actionable mutations and PD-L1 expression of 0% diagnosed in January 2023 status post wedge resection of the  right upper lobe lung mass in a setting of CABG under the care of Dr. Kipp Brood but the PET scan showed residual disease in the right upper lobe in addition to hypermetabolic right hilar, subcarinal and left infrahilar lymph nodes. The patient was seen by Dr. Lamonte Sakai and biopsy of the right upper lobe as well as the subcarinal and left hilar lymph nodes were positive for malignancy consistent with non-small cell lung cancer, adenocarcinoma. He underwent a course of concurrent chemoradiation with weekly carboplatin for AUC of 2 and paclitaxel 45 Mg/M2 status post 6 cycles.  The patient tolerated this previous course of concurrent chemoradiation fairly well except for fatigue and mild odynophagia. The patient is currently undergoing consolidation treatment with immunotherapy with Imfinzi 1500 Mg IV every 4 weeks status post 1 cycle.  He tolerated the first cycle of his treatment fairly well. I recommended for him to proceed with cycle #2 today as planned. He will come back for follow-up visit in 4 weeks for evaluation before the next cycle of his treatment. The patient was advised to call immediately if he has any concerning symptoms in the interval.  The patient voices understanding of current disease status and treatment options and is in agreement with the current care plan. All questions were answered. The patient knows to call the clinic with any problems, questions or concerns. We can certainly see the patient much sooner if necessary. The total time spent in the appointment was 20 minutes.   Disclaimer: This note was dictated with voice recognition software. Similar sounding words can inadvertently be transcribed and may not be corrected upon review.

## 2021-12-17 ENCOUNTER — Telehealth: Payer: Self-pay

## 2021-12-17 NOTE — Telephone Encounter (Signed)
Patient called and notified. Appointment rescheduled with patient. Voiced understanding.

## 2021-12-17 NOTE — Telephone Encounter (Signed)
-----   Message from Franchot Gallo, MD sent at 12/17/2021  8:03 AM EDT ----- Please let pt know that cardiology did not want him coming off of plavix until January. I would not recommend that we do his surgery until then, as doing the stone removal w/ laser would produce bleeding that would be worse on a blood thinner.  If he agreres to wait, we can bring him in this Dec to reschedule and check UA

## 2021-12-18 ENCOUNTER — Other Ambulatory Visit: Payer: Self-pay

## 2021-12-22 ENCOUNTER — Ambulatory Visit: Payer: Medicare HMO | Admitting: Physician Assistant

## 2021-12-22 ENCOUNTER — Other Ambulatory Visit: Payer: Medicare HMO

## 2021-12-22 ENCOUNTER — Inpatient Hospital Stay: Payer: Medicare HMO | Attending: Internal Medicine

## 2021-12-22 ENCOUNTER — Ambulatory Visit: Payer: Medicare HMO

## 2021-12-22 ENCOUNTER — Encounter: Payer: Self-pay | Admitting: Internal Medicine

## 2021-12-22 ENCOUNTER — Other Ambulatory Visit: Payer: Self-pay

## 2021-12-22 ENCOUNTER — Inpatient Hospital Stay (HOSPITAL_BASED_OUTPATIENT_CLINIC_OR_DEPARTMENT_OTHER): Payer: Medicare HMO | Admitting: Internal Medicine

## 2021-12-22 ENCOUNTER — Encounter: Payer: Self-pay | Admitting: Medical Oncology

## 2021-12-22 ENCOUNTER — Inpatient Hospital Stay: Payer: Medicare HMO

## 2021-12-22 VITALS — BP 117/65 | HR 70 | Temp 97.7°F | Resp 15 | Wt 170.7 lb

## 2021-12-22 DIAGNOSIS — Z923 Personal history of irradiation: Secondary | ICD-10-CM | POA: Diagnosis not present

## 2021-12-22 DIAGNOSIS — Z7902 Long term (current) use of antithrombotics/antiplatelets: Secondary | ICD-10-CM | POA: Insufficient documentation

## 2021-12-22 DIAGNOSIS — C3490 Malignant neoplasm of unspecified part of unspecified bronchus or lung: Secondary | ICD-10-CM

## 2021-12-22 DIAGNOSIS — C3411 Malignant neoplasm of upper lobe, right bronchus or lung: Secondary | ICD-10-CM | POA: Insufficient documentation

## 2021-12-22 DIAGNOSIS — Z7982 Long term (current) use of aspirin: Secondary | ICD-10-CM | POA: Diagnosis not present

## 2021-12-22 DIAGNOSIS — C3491 Malignant neoplasm of unspecified part of right bronchus or lung: Secondary | ICD-10-CM | POA: Diagnosis not present

## 2021-12-22 DIAGNOSIS — Z5112 Encounter for antineoplastic immunotherapy: Secondary | ICD-10-CM | POA: Diagnosis not present

## 2021-12-22 DIAGNOSIS — Z79899 Other long term (current) drug therapy: Secondary | ICD-10-CM | POA: Insufficient documentation

## 2021-12-22 LAB — CMP (CANCER CENTER ONLY)
ALT: 7 U/L (ref 0–44)
AST: 11 U/L — ABNORMAL LOW (ref 15–41)
Albumin: 3.9 g/dL (ref 3.5–5.0)
Alkaline Phosphatase: 74 U/L (ref 38–126)
Anion gap: 5 (ref 5–15)
BUN: 18 mg/dL (ref 8–23)
CO2: 27 mmol/L (ref 22–32)
Calcium: 9.1 mg/dL (ref 8.9–10.3)
Chloride: 106 mmol/L (ref 98–111)
Creatinine: 1.28 mg/dL — ABNORMAL HIGH (ref 0.61–1.24)
GFR, Estimated: 57 mL/min — ABNORMAL LOW (ref >60)
Glucose, Bld: 113 mg/dL — ABNORMAL HIGH (ref 70–99)
Potassium: 3.8 mmol/L (ref 3.5–5.1)
Sodium: 138 mmol/L (ref 135–145)
Total Bilirubin: 0.8 mg/dL (ref 0.3–1.2)
Total Protein: 6.6 g/dL (ref 6.5–8.1)

## 2021-12-22 LAB — CBC WITH DIFFERENTIAL (CANCER CENTER ONLY)
Abs Immature Granulocytes: 0.01 10*3/uL (ref 0.00–0.07)
Basophils Absolute: 0 10*3/uL (ref 0.0–0.1)
Basophils Relative: 1 %
Eosinophils Absolute: 0.2 10*3/uL (ref 0.0–0.5)
Eosinophils Relative: 4 %
HCT: 33.4 % — ABNORMAL LOW (ref 39.0–52.0)
Hemoglobin: 11.2 g/dL — ABNORMAL LOW (ref 13.0–17.0)
Immature Granulocytes: 0 %
Lymphocytes Relative: 16 %
Lymphs Abs: 0.7 10*3/uL (ref 0.7–4.0)
MCH: 32.4 pg (ref 26.0–34.0)
MCHC: 33.5 g/dL (ref 30.0–36.0)
MCV: 96.5 fL (ref 80.0–100.0)
Monocytes Absolute: 0.5 10*3/uL (ref 0.1–1.0)
Monocytes Relative: 11 %
Neutro Abs: 3.2 10*3/uL (ref 1.7–7.7)
Neutrophils Relative %: 68 %
Platelet Count: 161 10*3/uL (ref 150–400)
RBC: 3.46 MIL/uL — ABNORMAL LOW (ref 4.22–5.81)
RDW: 12.2 % (ref 11.5–15.5)
WBC Count: 4.7 10*3/uL (ref 4.0–10.5)
nRBC: 0 % (ref 0.0–0.2)

## 2021-12-22 LAB — TSH: TSH: 3.59 u[IU]/mL (ref 0.350–4.500)

## 2021-12-22 MED ORDER — SODIUM CHLORIDE 0.9 % IV SOLN
1500.0000 mg | Freq: Once | INTRAVENOUS | Status: AC
  Administered 2021-12-22: 1500 mg via INTRAVENOUS
  Filled 2021-12-22: qty 30

## 2021-12-22 MED ORDER — SODIUM CHLORIDE 0.9 % IV SOLN: Freq: Once | INTRAVENOUS | Status: AC

## 2021-12-22 NOTE — Progress Notes (Signed)
[facility] [phone]   Fax:([phone]  OFFICE PROGRESS NOTE  [name], MD [place] [address]  DIAGNOSIS: Stage IIIC (T3, N3, M0) non-small cell lung cancer, adenocarcinoma presented with right upper lobe lung mass status post right upper lobe wedge resection but no lymph node dissection.  The tumor measured 3.3 cm with visceral pleural involvement and very close resection margin less than 0.1 cm from the tumor.  This was performed on May 13, 2021 in the same setting of CABG under the care of Dr. Kipp Brood.  PET scan performed in March 2023 showed a right upper lobe nodule adjacent to the wedge resection site small hypermetabolic right hilar, right subcarinal, and left infrahilar lymph nodes which could be reactive but are suspicious for nodal involvement.   Molecular studies showed no actionable mutations and PD-L1 expression was 0%   PRIOR THERAPY:  1) Right upper lobe wedge resection under the care of Dr. Kipp Brood on 05/13/2021. 2) Concurrent chemoradiation with weekly carboplatin for AUC of 2 and paclitaxel 45 Mg/M2.  First dose Aug 11, 2021.  Status post 6 cycles.  Last dose was given September 22, 2021   CURRENT THERAPY: Consolidation treatment with immunotherapy with Imfinzi 1500 Mg IV every 4 weeks.  First dose October 27, 2021.  Status post 2 cycles.  INTERVAL HISTORY: Kevin [name] 81 y.o. male returns to the clinic today for follow-up visit accompanied by his wife.  The patient is feeling fine today with no concerning complaints.  He has been tolerating his immunotherapy fairly well.  He denied having any skin rash or diarrhea.  He has no chest pain, shortness of breath, cough or hemoptysis.  He has no nausea, vomiting, abdominal pain or constipation.  He has no headache or visual changes.  He denied having any recent weight loss or night sweats.  He is here today for evaluation before starting cycle #3 of his treatment.   MEDICAL  HISTORY: Past Medical History:  Diagnosis Date   Arthritis    Coronary artery disease    Hyperlipidemia    SCC (squamous cell carcinoma) 05/28/2004   top of left hand curet x3 18f   SCC (squamous cell carcinoma) 09/13/2013   mid back tx cx3 548f  SCC (squamous cell carcinoma) 09/06/2018   left wrist tx with bx   Squamous cell carcinoma of skin 11/16/2003   left ear tip tx curet and cautery [id number]  ALLERGIES:  has No Known Allergies.  MEDICATIONS:  Current Outpatient Medications  Medication Sig Dispense Refill   aspirin EC 81 MG tablet Take 81 mg by mouth daily.     atorvastatin (LIPITOR) 80 MG tablet Take 1 tablet (80 mg total) by mouth daily. 90 tablet 3   Calcium Carb-Cholecalciferol (CALCIUM 600 + D PO) Take 1,200 mg by mouth daily.     Cholecalciferol (DIALYVITE VITAMIN D 5000) 125 MCG (5000 UT) capsule Take 5,000 Units by mouth daily.     clopidogrel (PLAVIX) 75 MG tablet Take 1 tablet (75 mg total) by mouth daily. Okay to restart this medication on 07/23/2021 90 tablet 2   metoprolol tartrate (LOPRESSOR) 25 MG tablet Take 0.5 tablets (12.5 mg total) by mouth 2 (two) times daily. 180 tablet 3   nitroGLYCERIN (NITROSTAT) 0.4 MG SL tablet Place 0.4 mg under the tongue every 5 (five) minutes as needed for chest pain. (Patient not taking: Reported on 12/22/2021)     No current  facility-administered medications for this visit.    SURGICAL HISTORY:  Past Surgical History:  Procedure Laterality Date   BRONCHIAL BIOPSY  07/22/2021   Procedure: BRONCHIAL BIOPSIES;  Surgeon: Collene Gobble, MD;  Location: [facility];  Service: Cardiopulmonary;;   BRONCHIAL BRUSHINGS  07/22/2021   Procedure: BRONCHIAL BRUSHINGS;  Surgeon: Collene Gobble, MD;  Location: [facility];  Service: Cardiopulmonary;;   BRONCHIAL NEEDLE ASPIRATION BIOPSY  07/22/2021   Procedure: BRONCHIAL NEEDLE ASPIRATION BIOPSIES;  Surgeon: Collene Gobble, MD;  Location: [facility];  Service: Cardiopulmonary;;    CORONARY ARTERY BYPASS GRAFT N/A 05/13/2021   Procedure: CORONARY ARTERY BYPASS GRAFTING (CABG) TIMES 3  , ON PUMP, USING LEFT INTERNAL MAMMARY ARTERY AND RIGHT GREATER SAPHENOUS VEIN HARVESTED ENDOSCOPICALLY;  Surgeon: Lajuana Matte, MD;  Location: [facility];  Service: Open Heart Surgery;  Laterality: N/A;   ENDOVEIN HARVEST OF GREATER SAPHENOUS VEIN Right 05/13/2021   Procedure: ENDOVEIN HARVEST OF GREATER SAPHENOUS VEIN;  Surgeon: Lajuana Matte, MD;  Location: [facility];  Service: Open Heart Surgery;  Laterality: Right;   epidural     back pain   IR CT SPINE LTD  06/18/2020   IR KYPHO LUMBAR INC FX REDUCE BONE BX UNI/BIL CANNULATION INC/IMAGING  06/18/2020   LEFT HEART CATH AND CORONARY ANGIOGRAPHY N/A 05/06/2021   Procedure: LEFT HEART CATH AND CORONARY ANGIOGRAPHY;  Surgeon: [name], MD;  Location: [facility];  Service: Cardiovascular;  Laterality: N/A;   LUNG BIOPSY Right 05/13/2021   Procedure: RIGHT LUNG BIOPSY;  Surgeon: Lajuana Matte, MD;  Location: [facility];  Service: Open Heart Surgery;  Laterality: Right;   MINOR HEMORRHOIDECTOMY     SHOULDER SURGERY     TEE WITHOUT CARDIOVERSION N/A 05/13/2021   Procedure: TRANSESOPHAGEAL ECHOCARDIOGRAM (TEE);  Surgeon: Lajuana Matte, MD;  Location: [facility];  Service: Open Heart Surgery;  Laterality: N/A;   TRANSURETHRAL RESECTION OF BLADDER TUMOR  2014   TRANSURETHRAL RESECTION OF BLADDER TUMOR N/A 05/19/2018   Procedure: TRANSURETHRAL RESECTION OF BLADDER TUMOR (TURBT) AND (INTRAVESICAL GEMCITABINE INSTILLED IN BLADDER IN PACU) ;  Surgeon: Franchot Gallo, MD;  Location: [facility];  Service: Urology;  Laterality: N/A;  [place] Bilateral 07/22/2021   Procedure: VIDEO BRONCHOSCOPY WITH ENDOBRONCHIAL ULTRASOUND;  Surgeon: Collene Gobble, MD;  Location[facility];  Service: Cardiopulmonary;  Laterality: Bilateral;    REVIEW OF SYSTEMS:  A comprehensive review of systems was  negative.   PHYSICAL EXAMINATION: General appearance: alert, cooperative, and no distress Head: Normocephalic, without obvious abnormality, atraumatic Neck: no adenopathy, no JVD, supple, symmetrical, trachea midline, and thyroid not enlarged, symmetric, no tenderness/mass/nodules Lymph nodes: Cervical, supraclavicular, and axillary nodes normal. Resp: clear to auscultation bilaterally Back: symmetric, no curvature. ROM normal. No CVA tenderness. Cardio: regular rate and rhythm, S1, S2 normal, no murmur, click, rub or gallop GI: soft, non-tender; bowel sounds normal; no masses,  no organomegaly Extremities: extremities normal, atraumatic, no cyanosis or edema  ECOG PERFORMANCE STATUS: 0 - Asymptomatic  Blood pressure 117/65, pulse 70, temperature 97.7 F (36.5 C), temperature source Oral, resp. rate 15, weight 170 lb 11.2 oz (77.4 kg), SpO2 99 %.  LABORATORY DATA: Lab Results  Component Value Date   WBC 4.7 12/22/2021   HGB 11.2 (L) 12/22/2021   HCT 33.4 (L) 12/22/2021   MCV 96.5 12/22/2021   PLT 161 12/22/2021      Chemistry      Component Value Date/Time  NA 139 11/25/2021 0935   NA 139 05/01/2021 1146   K 4.0 11/25/2021 0935   CL 107 11/25/2021 0935   CO2 24 11/25/2021 0935   BUN 18 11/25/2021 0935   BUN 20 05/01/2021 1146   CREATININE 1.42 (H) 11/25/2021 0935      Component Value Date/Time   CALCIUM 9.1 11/25/2021 0935   ALKPHOS 66 11/25/2021 0935   AST 15 11/25/2021 0935   ALT 9 11/25/2021 0935   BILITOT 0.9 11/25/2021 0935       RADIOGRAPHIC STUDIES: No results found.  ASSESSMENT AND PLAN: This is a very pleasant 81 years old white male diagnosed with stage IIIC (T3, N3, M0) non-small cell lung cancer, adenocarcinoma with no actionable mutations and PD-L1 expression of 0% diagnosed in January 2023 status post wedge resection of the right upper lobe lung mass in a setting of CABG under the care of Dr. Kipp Brood but the PET scan showed residual disease in the  right upper lobe in addition to hypermetabolic right hilar, subcarinal and left infrahilar lymph nodes. The patient was seen by Dr. Lamonte Sakai and biopsy of the right upper lobe as well as the subcarinal and left hilar lymph nodes were positive for malignancy consistent with non-small cell lung cancer, adenocarcinoma. He underwent a course of concurrent chemoradiation with weekly carboplatin for AUC of 2 and paclitaxel 45 Mg/M2 status post 6 cycles.  The patient tolerated this previous course of concurrent chemoradiation fairly well except for fatigue and mild odynophagia. The patient is currently undergoing consolidation treatment with immunotherapy with Imfinzi 1500 Mg IV every 4 weeks status post 2 cycles.    The patient has been tolerating this treatment well with no concerning adverse effects. I recommended for him to proceed with cycle #3 today as planned. I will see him back for follow-up visit in 4 weeks for evaluation with repeat CT scan of the chest for restaging of his disease. The patient was advised to call immediately if he has any concerning symptoms in the interval. The patient voices understanding of current disease status and treatment options and is in agreement with the current care plan. All questions were answered. The patient knows to call the clinic with any problems, questions or concerns. We can certainly see the patient much sooner if necessary. The total time spent in the appointment was 20 minutes.   Disclaimer: This note was dictated with voice recognition software. Similar sounding words can inadvertently be transcribed and may not be corrected upon review.

## 2021-12-22 NOTE — Patient Instructions (Signed)
[facility]  Discharge Instructions: Thank you for choosing [facility] to provide your oncology and hematology care.   If you have a lab appointment with the [facility], please go directly to the [facility] and check in at the registration area.   Wear comfortable clothing and clothing appropriate for easy access to any Portacath or PICC line.   We strive to give you quality time with your provider. You may need to reschedule your appointment if you arrive late (15 or more minutes).  Arriving late affects you and other patients whose appointments are after yours.  Also, if you miss three or more appointments without notifying the office, you may be dismissed from the clinic at the provider's discretion.      For prescription refill requests, have your pharmacy contact our office and allow 72 hours for refills to be completed.    Today you received the following chemotherapy and/or immunotherapy agents: durvalumab      To help prevent nausea and vomiting after your treatment, we encourage you to take your nausea medication as directed.  BELOW ARE SYMPTOMS THAT SHOULD BE REPORTED IMMEDIATELY: *FEVER GREATER THAN 100.4 F (38 C) OR HIGHER *CHILLS OR SWEATING *NAUSEA AND VOMITING THAT IS NOT CONTROLLED WITH YOUR NAUSEA MEDICATION *UNUSUAL SHORTNESS OF BREATH *UNUSUAL BRUISING OR BLEEDING *URINARY PROBLEMS (pain or burning when urinating, or frequent urination) *BOWEL PROBLEMS (unusual diarrhea, constipation, pain near the anus) TENDERNESS IN MOUTH AND THROAT WITH OR WITHOUT PRESENCE OF ULCERS (sore throat, sores in mouth, or a toothache) UNUSUAL RASH, SWELLING OR PAIN  UNUSUAL VAGINAL DISCHARGE OR ITCHING   Items with * indicate a potential emergency and should be followed up as soon as possible or go to the Emergency Department if any problems should occur.  Please show the CHEMOTHERAPY ALERT CARD or IMMUNOTHERAPY ALERT CARD at check-in to  the Emergency Department and triage nurse.  Should you have questions after your visit or need to cancel or reschedule your appointment, please contact [facility]  Dept: [phone]  and follow the prompts.  Office hours are 8:00 a.m. to 4:30 p.m. Monday - Friday. Please note that voicemails left after 4:00 p.m. may not be returned until the following business day.  We are closed weekends and major holidays. You have access to a nurse at all times for urgent questions. Please call the main number to the clinic Dept: [phone] and follow the prompts.   For any non-urgent questions, you may also contact your provider using MyChart. We now offer e-Visits for anyone 55 and older to request care online for non-urgent symptoms. For details visit [email or web address].   Also download the MyChart app! Go to the app store, search "MyChart", open the app, select [facility], and log in with your MyChart username and password.  Masks are optional in the cancer centers. If you would like for your care team to wear a mask while they are taking care of you, please let them know. You may have one support person who is at least 79 years old accompany you for your appointments.

## 2021-12-23 LAB — T4: T4, Total: 4.8 ug/dL (ref 4.5–12.0)

## 2022-01-09 ENCOUNTER — Ambulatory Visit: Payer: Medicare HMO | Admitting: Cardiology

## 2022-01-14 ENCOUNTER — Encounter: Payer: Self-pay | Admitting: Cardiology

## 2022-01-14 ENCOUNTER — Ambulatory Visit: Payer: Medicare HMO | Admitting: Cardiology

## 2022-01-14 VITALS — BP 104/56 | HR 79 | Temp 98.0°F | Resp 16 | Ht 70.0 in | Wt 170.8 lb

## 2022-01-14 DIAGNOSIS — E7800 Pure hypercholesterolemia, unspecified: Secondary | ICD-10-CM | POA: Diagnosis not present

## 2022-01-14 DIAGNOSIS — I2510 Atherosclerotic heart disease of native coronary artery without angina pectoris: Secondary | ICD-10-CM | POA: Diagnosis not present

## 2022-01-14 DIAGNOSIS — N1831 Chronic kidney disease, stage 3a: Secondary | ICD-10-CM | POA: Diagnosis not present

## 2022-01-14 NOTE — Progress Notes (Signed)
Primary Physician/Referring:  [name], MD  Patient ID: Kevin [name], male    DOB: 28-Nov-1940, 81 y.o.   MRN: [id number]  Chief Complaint  Patient presents with   Coronary Artery Disease   Hyperlipidemia   Follow-up    6 months   HPI:    Kevin [name]  is a 81 y.o. Caucasian male with history of hyperlipidemia, 30-pack-year history of smoking quit in 1987, who was referred to our office for evaluation of chest pain and abnormal EKG.  Patient underwent cardiac catheterization 05/01/2021 revealing multivessel disease including critical left main disease, subsequently underwent CABG x3 on 05/13/2021.  During admission patient diagnosed with right upper lung adenocarcinoma, and underwent lung wedge resection.  He is being followed by oncology as well.  He is now undergoing immunotherapy for secondaries.  He is presently asymptomatic.  Past Medical History:  Diagnosis Date   Arthritis    Coronary artery disease    Hyperlipidemia    SCC (squamous cell carcinoma) 05/28/2004   top of left hand curet x3 56f   SCC (squamous cell carcinoma) 09/13/2013   mid back tx cx3 538f  SCC (squamous cell carcinoma) 09/06/2018   left wrist tx with bx   Squamous cell carcinoma of skin 11/16/2003   left ear tip tx curet and cautery 28f22f Social History   Tobacco Use   Smoking status: Former    Packs/day: 1.00    Years: 30.00    Total pack years: 30.00    Types: Cigarettes    Quit date: 04/14/1995    Years since quitting: 26.7   Smokeless tobacco: Never  Substance Use Topics   Alcohol use: Never   Marital Status: Married  ROS  Review of Systems  Cardiovascular:  Negative for chest pain, dyspnea on exertion, leg swelling and syncope.  Neurological:  Negative for dizziness.   Objective  Blood pressure (!) 104/56, pulse 79, temperature 98 F (36.7 C), temperature source Temporal, resp. rate 16, height 5' 10"  (1.778 m), weight 170 lb 12.8 oz (77.5 kg), SpO2 97 %. Body mass index is 24.51  kg/m.     01/14/2022    1:58 PM 12/22/2021    9:14 AM 11/25/2021   12:46 PM  Vitals with BMI  Height 5' 10"     Weight 170 lbs 13 oz 170 lbs 11 oz   BMI [id number] [id number] 56 65 64  Pulse 79 70 68    Physical Exam Vitals reviewed.  Neck:     Vascular: No carotid bruit or JVD.  Cardiovascular:     Rate and Rhythm: Normal rate and regular rhythm.     Pulses: Intact distal pulses.     Heart sounds: Normal heart sounds. No murmur heard.    No gallop.  Pulmonary:     Effort: Pulmonary effort is normal.     Breath sounds: Normal breath sounds.  Musculoskeletal:     Right lower leg: No edema.     Left lower leg: No edema.   Physical exam unchanged compared to previous office visit.  Laboratory examination:   Lab Results  Component Value Date   NA 138 12/22/2021   K 3.8 12/22/2021   CO2 27 12/22/2021   GLUCOSE 113 (H) 12/22/2021   BUN 18 12/22/2021   CREATININE 1.28 (H) 12/22/2021   CALCIUM 9.1 12/22/2021   EGFR 52 (L) 05/01/2021   GFRNONAA 57 (L) 12/22/2021  Latest Ref Rng & Units 12/22/2021    8:43 AM 11/25/2021    9:35 AM 10/27/2021   12:46 PM  CMP  Glucose 70 - 99 mg/dL 113  98  116   BUN 8 - 23 mg/dL 18  18  17    Creatinine 0.61 - 1.24 mg/dL 1.28  1.42  1.19   Sodium 135 - 145 mmol/L 138  139  139   Potassium 3.5 - 5.1 mmol/L 3.8  4.0  4.0   Chloride 98 - 111 mmol/L 106  107  106   CO2 22 - 32 mmol/L 27  24  28    Calcium 8.9 - 10.3 mg/dL 9.1  9.1  9.5   Total Protein 6.5 - 8.1 g/dL 6.6  6.7  6.8   Total Bilirubin 0.3 - 1.2 mg/dL 0.8  0.9  1.2   Alkaline Phos 38 - 126 U/L 74  66  81   AST 15 - 41 U/L 11  15  14    ALT 0 - 44 U/L 7  9  9        Latest Ref Rng & Units 12/22/2021    8:43 AM 11/25/2021    9:35 AM 10/27/2021   12:46 PM  CBC  WBC 4.0 - 10.5 K/uL 4.7  4.6  3.6   Hemoglobin 13.0 - 17.0 g/dL 11.2  10.7  10.1   Hematocrit 39.0 - 52.0 % 33.4  31.7  31.0   Platelets 150 - 400 K/uL 161  146  164     Lipid Panel      Component Value Date/Time   LDLDIRECT 80 05/01/2021 1146   HEMOGLOBIN A1C Lab Results  Component Value Date   HGBA1C 5.2 05/13/2021   MPG 102.54 05/13/2021   TSH Recent Labs    05/07/21 0915 12/22/21 0843  TSH 3.714 3.590   External labs:   07/05/2020: Total cholesterol 149, triglycerides 134, HDL 40, LDL 85.  Allergies  No Known Allergies   Medications      Current Outpatient Medications:    aspirin EC 81 MG tablet, Take 81 mg by mouth daily., Disp: , Rfl:    atorvastatin (LIPITOR) 80 MG tablet, Take 1 tablet (80 mg total) by mouth daily., Disp: 90 tablet, Rfl: 3   Calcium Carb-Cholecalciferol (CALCIUM 600 + D PO), Take 1,200 mg by mouth daily., Disp: , Rfl:    Cholecalciferol (DIALYVITE VITAMIN D 5000) 125 MCG (5000 UT) capsule, Take 5,000 Units by mouth daily., Disp: , Rfl:    clopidogrel (PLAVIX) 75 MG tablet, Take 1 tablet (75 mg total) by mouth daily. Okay to restart this medication on 07/23/2021, Disp: 90 tablet, Rfl: 2   metoprolol tartrate (LOPRESSOR) 25 MG tablet, Take 0.5 tablets (12.5 mg total) by mouth 2 (two) times daily., Disp: 180 tablet, Rfl: 3   nitroGLYCERIN (NITROSTAT) 0.4 MG SL tablet, Place 0.4 mg under the tongue every 5 (five) minutes as needed for chest pain., Disp: , Rfl:   Radiology:   CT chest 05/07/2021: 1. Right upper lobe spiculated lesions consistent with malignancy. Multidisciplinary consult is advised. 2. No evidence of metastatic disease. 3. Aortic Atherosclerosis (ICD10-I70.0) and Emphysema (ICD10-J43.9).  Chest x-ray 05/16/2021: 1. Post removal of chest support tubes and RIGHT IJ central venous line. No visible pneumothorax. 2. Improved aeration in the chest with trace LEFT effusion. 3. Diminished opacity in the RIGHT upper lobe adjacent to surgical changes at the site of previous mass may reflect resolving pleural fluid and or atelectasis in this area,  close attention on follow-up.  Cardiac Studies:   Left Heart Catheterization  05/06/21:  LV: 126/4, EDP 11 mmHg.  Ao 123/46, mean 33 mmHg.  No pressure gradient across aortic valve. LVEF: 40 to 45% with global hypokinesis.  LV is mildly dilated. LM: High-grade, complex, calcified 95% distal left main stenosis. LAD: Proximal segment mild to moderately calcified.  Proximal to mid 60% calcific stenosis.  Gives origin to 3 large diagonals and a fourth large diagonal from the mid to distal segment, mild disease.  No significant calcification mid to distal LAD. CX: Gives origin to high OM1 which is got mild disease, after that there is a 60% calcific focal stenosis.  Mid to distal circumflex is large, dominant with very mild noncalcific disease. RCA: Nondominant, normal.   Echocardiogram 05/07/2021:  1. Left ventricular ejection fraction, by estimation, is 40 to 45%. The left ventricle has mildly decreased function. The left ventricle demonstrates global hypokinesis. Left ventricular diastolic parameters were normal.   2. Right ventricular systolic function is normal. The right ventricular size is normal.   3. The mitral valve is normal in structure. Trivial mitral valve regurgitation. No evidence of mitral stenosis.   4. The aortic valve is normal in structure. Aortic valve regurgitation is not visualized. No aortic stenosis is present.  CABG x3 05/13/2021:  LIMA to LAD, reverse SVG to OM- 1 and left PDA. Core needle biopsy of the right upper lobe mass unsuccessful and hence underwent right upper lobe wedge resection  EKG:    EKG 01/14/2022: Normal sinus rhythm at rate of 79 bpm with frequent PACs, left atrial enlargement, normal axis.  Nonspecific T abnormality.  No significant change from 10/01/2021.   Assessment     ICD-10-CM   1. Coronary artery disease involving native coronary artery of native heart without angina pectoris  I25.10 EKG 12-Lead    2. Hypercholesteremia  E78.00     3. Stage 3a chronic kidney disease (HCC)  N18.31       There are no discontinued  medications.    No orders of the defined types were placed in this encounter.  Orders Placed This Encounter  Procedures   EKG 12-Lead     Recommendations:   Kevin [name] is a 81 y.o. Caucasian male with history of hyperlipidemia, 30-pack-year history of smoking quit in 1987, who was referred to our office for evaluation of chest pain and abnormal EKG.  Patient underwent cardiac catheterization 05/01/2021 revealing multivessel disease including critical left main disease, subsequently underwent CABG x3 on 05/13/2021.  During admission patient diagnosed with right upper lung adenocarcinoma, and underwent lung wedge resection.  He is being followed by oncology as well.  He is now undergoing immunotherapy for secondaries.  He is presently asymptomatic without recurrence of angina pectoris.  He is on a low-dose of a beta-blocker.  I did not make any changes to his medication including addition of an ACE inhibitor or an ARB although he has coronary disease and stage IIIa chronic kidney disease which she would benefit from, in view of him undergoing chemotherapy and malignancy being still worked up.  With regard to hyperlipidemia, LDL is >70 but I do not want to add Zetia to his present medical regimen again for the same reason as above.  Otherwise no clinical evidence of heart failure, he has not had any angina pectoris, I will see him back in 6 months for follow-up.     Kevin Prows, MD, [facility] 01/14/2022, 2:40 PM Office: [phone] Fax: [phone]  Pager: [phone]

## 2022-01-15 ENCOUNTER — Other Ambulatory Visit: Payer: Self-pay

## 2022-01-15 ENCOUNTER — Ambulatory Visit (HOSPITAL_COMMUNITY)
Admission: RE | Admit: 2022-01-15 | Discharge: 2022-01-15 | Disposition: A | Discharge status: Home / Self Care | Payer: Medicare HMO | Source: Ambulatory Visit | Attending: Internal Medicine | Admitting: Internal Medicine

## 2022-01-15 ENCOUNTER — Inpatient Hospital Stay: Payer: Medicare HMO | Attending: Internal Medicine

## 2022-01-15 DIAGNOSIS — C3491 Malignant neoplasm of unspecified part of right bronchus or lung: Secondary | ICD-10-CM

## 2022-01-15 DIAGNOSIS — J90 Pleural effusion, not elsewhere classified: Secondary | ICD-10-CM | POA: Diagnosis not present

## 2022-01-15 DIAGNOSIS — J439 Emphysema, unspecified: Secondary | ICD-10-CM | POA: Diagnosis not present

## 2022-01-15 DIAGNOSIS — C3490 Malignant neoplasm of unspecified part of unspecified bronchus or lung: Secondary | ICD-10-CM | POA: Diagnosis not present

## 2022-01-15 DIAGNOSIS — R911 Solitary pulmonary nodule: Secondary | ICD-10-CM | POA: Diagnosis not present

## 2022-01-15 LAB — CBC WITH DIFFERENTIAL (CANCER CENTER ONLY)
Abs Immature Granulocytes: 0.01 10*3/uL (ref 0.00–0.07)
Basophils Absolute: 0 10*3/uL (ref 0.0–0.1)
Basophils Relative: 0 %
Eosinophils Absolute: 0.2 10*3/uL (ref 0.0–0.5)
Eosinophils Relative: 3 %
HCT: 35 % — ABNORMAL LOW (ref 39.0–52.0)
Hemoglobin: 11.7 g/dL — ABNORMAL LOW (ref 13.0–17.0)
Immature Granulocytes: 0 %
Lymphocytes Relative: 19 %
Lymphs Abs: 1.1 10*3/uL (ref 0.7–4.0)
MCH: 31 pg (ref 26.0–34.0)
MCHC: 33.4 g/dL (ref 30.0–36.0)
MCV: 92.8 fL (ref 80.0–100.0)
Monocytes Absolute: 0.7 10*3/uL (ref 0.1–1.0)
Monocytes Relative: 11 %
Neutro Abs: 3.8 10*3/uL (ref 1.7–7.7)
Neutrophils Relative %: 67 %
Platelet Count: 189 10*3/uL (ref 150–400)
RBC: 3.77 MIL/uL — ABNORMAL LOW (ref 4.22–5.81)
RDW: 12.7 % (ref 11.5–15.5)
WBC Count: 5.7 10*3/uL (ref 4.0–10.5)
nRBC: 0 % (ref 0.0–0.2)

## 2022-01-15 LAB — CMP (CANCER CENTER ONLY)
ALT: 7 U/L (ref 0–44)
AST: 11 U/L — ABNORMAL LOW (ref 15–41)
Albumin: 4 g/dL (ref 3.5–5.0)
Alkaline Phosphatase: 77 U/L (ref 38–126)
Anion gap: 5 (ref 5–15)
BUN: 16 mg/dL (ref 8–23)
CO2: 28 mmol/L (ref 22–32)
Calcium: 9.1 mg/dL (ref 8.9–10.3)
Chloride: 103 mmol/L (ref 98–111)
Creatinine: 1.31 mg/dL — ABNORMAL HIGH (ref 0.61–1.24)
GFR, Estimated: 56 mL/min — ABNORMAL LOW (ref >60)
Glucose, Bld: 95 mg/dL (ref 70–99)
Potassium: 4.3 mmol/L (ref 3.5–5.1)
Sodium: 136 mmol/L (ref 135–145)
Total Bilirubin: 1 mg/dL (ref 0.3–1.2)
Total Protein: 7.1 g/dL (ref 6.5–8.1)

## 2022-01-15 MED ORDER — IOHEXOL 300 MG/ML  SOLN
75.0000 mL | Freq: Once | INTRAMUSCULAR | Status: AC | PRN
  Administered 2022-01-15: 75 mL via INTRAVENOUS

## 2022-01-15 MED ORDER — SODIUM CHLORIDE (PF) 0.9 % IJ SOLN
INTRAMUSCULAR | Status: AC
  Filled 2022-01-15: qty 50

## 2022-01-19 ENCOUNTER — Other Ambulatory Visit: Payer: Self-pay

## 2022-01-19 ENCOUNTER — Inpatient Hospital Stay: Payer: Medicare HMO

## 2022-01-19 ENCOUNTER — Encounter: Payer: Self-pay | Admitting: Internal Medicine

## 2022-01-19 ENCOUNTER — Inpatient Hospital Stay (HOSPITAL_BASED_OUTPATIENT_CLINIC_OR_DEPARTMENT_OTHER): Payer: Medicare HMO | Admitting: Internal Medicine

## 2022-01-19 VITALS — BP 131/79 | HR 68 | Temp 97.7°F | Resp 16

## 2022-01-19 DIAGNOSIS — C3491 Malignant neoplasm of unspecified part of right bronchus or lung: Secondary | ICD-10-CM | POA: Diagnosis not present

## 2022-01-19 DIAGNOSIS — C3411 Malignant neoplasm of upper lobe, right bronchus or lung: Secondary | ICD-10-CM | POA: Insufficient documentation

## 2022-01-19 DIAGNOSIS — Z9221 Personal history of antineoplastic chemotherapy: Secondary | ICD-10-CM | POA: Insufficient documentation

## 2022-01-19 DIAGNOSIS — Z5112 Encounter for antineoplastic immunotherapy: Secondary | ICD-10-CM | POA: Insufficient documentation

## 2022-01-19 DIAGNOSIS — Z923 Personal history of irradiation: Secondary | ICD-10-CM | POA: Diagnosis not present

## 2022-01-19 DIAGNOSIS — Z79899 Other long term (current) drug therapy: Secondary | ICD-10-CM | POA: Insufficient documentation

## 2022-01-19 LAB — CMP (CANCER CENTER ONLY)
ALT: 8 U/L (ref 0–44)
AST: 11 U/L — ABNORMAL LOW (ref 15–41)
Albumin: 3.8 g/dL (ref 3.5–5.0)
Alkaline Phosphatase: 85 U/L (ref 38–126)
Anion gap: 4 — ABNORMAL LOW (ref 5–15)
BUN: 17 mg/dL (ref 8–23)
CO2: 27 mmol/L (ref 22–32)
Calcium: 8.9 mg/dL (ref 8.9–10.3)
Chloride: 105 mmol/L (ref 98–111)
Creatinine: 1.2 mg/dL (ref 0.61–1.24)
GFR, Estimated: >60 mL/min (ref >60)
Glucose, Bld: 94 mg/dL (ref 70–99)
Potassium: 4.3 mmol/L (ref 3.5–5.1)
Sodium: 136 mmol/L (ref 135–145)
Total Bilirubin: 0.7 mg/dL (ref 0.3–1.2)
Total Protein: 6.7 g/dL (ref 6.5–8.1)

## 2022-01-19 LAB — CBC WITH DIFFERENTIAL (CANCER CENTER ONLY)
Abs Immature Granulocytes: 0.01 10*3/uL (ref 0.00–0.07)
Basophils Absolute: 0 10*3/uL (ref 0.0–0.1)
Basophils Relative: 0 %
Eosinophils Absolute: 0.2 10*3/uL (ref 0.0–0.5)
Eosinophils Relative: 4 %
HCT: 34.2 % — ABNORMAL LOW (ref 39.0–52.0)
Hemoglobin: 11.5 g/dL — ABNORMAL LOW (ref 13.0–17.0)
Immature Granulocytes: 0 %
Lymphocytes Relative: 18 %
Lymphs Abs: 0.9 10*3/uL (ref 0.7–4.0)
MCH: 31.2 pg (ref 26.0–34.0)
MCHC: 33.6 g/dL (ref 30.0–36.0)
MCV: 92.7 fL (ref 80.0–100.0)
Monocytes Absolute: 0.6 10*3/uL (ref 0.1–1.0)
Monocytes Relative: 12 %
Neutro Abs: 3.5 10*3/uL (ref 1.7–7.7)
Neutrophils Relative %: 66 %
Platelet Count: 162 10*3/uL (ref 150–400)
RBC: 3.69 MIL/uL — ABNORMAL LOW (ref 4.22–5.81)
RDW: 12.6 % (ref 11.5–15.5)
WBC Count: 5.3 10*3/uL (ref 4.0–10.5)
nRBC: 0 % (ref 0.0–0.2)

## 2022-01-19 MED ORDER — SODIUM CHLORIDE 0.9 % IV SOLN: Freq: Once | INTRAVENOUS | Status: AC

## 2022-01-19 MED ORDER — SODIUM CHLORIDE 0.9 % IV SOLN
1500.0000 mg | Freq: Once | INTRAVENOUS | Status: AC
  Administered 2022-01-19: 1500 mg via INTRAVENOUS
  Filled 2022-01-19: qty 30

## 2022-01-19 NOTE — Patient Instructions (Signed)
[facility]  Discharge Instructions: Thank you for choosing [facility] to provide your oncology and hematology care.   If you have a lab appointment with the [facility], please go directly to the [facility] and check in at the registration area.   Wear comfortable clothing and clothing appropriate for easy access to any Portacath or PICC line.   We strive to give you quality time with your provider. You may need to reschedule your appointment if you arrive late (15 or more minutes).  Arriving late affects you and other patients whose appointments are after yours.  Also, if you miss three or more appointments without notifying the office, you may be dismissed from the clinic at the provider's discretion.      For prescription refill requests, have your pharmacy contact our office and allow 72 hours for refills to be completed.    Today you received the following chemotherapy and/or immunotherapy agents: Durvalumab      To help prevent nausea and vomiting after your treatment, we encourage you to take your nausea medication as directed.  BELOW ARE SYMPTOMS THAT SHOULD BE REPORTED IMMEDIATELY: *FEVER GREATER THAN 100.4 F (38 C) OR HIGHER *CHILLS OR SWEATING *NAUSEA AND VOMITING THAT IS NOT CONTROLLED WITH YOUR NAUSEA MEDICATION *UNUSUAL SHORTNESS OF BREATH *UNUSUAL BRUISING OR BLEEDING *URINARY PROBLEMS (pain or burning when urinating, or frequent urination) *BOWEL PROBLEMS (unusual diarrhea, constipation, pain near the anus) TENDERNESS IN MOUTH AND THROAT WITH OR WITHOUT PRESENCE OF ULCERS (sore throat, sores in mouth, or a toothache) UNUSUAL RASH, SWELLING OR PAIN  UNUSUAL VAGINAL DISCHARGE OR ITCHING   Items with * indicate a potential emergency and should be followed up as soon as possible or go to the Emergency Department if any problems should occur.  Please show the CHEMOTHERAPY ALERT CARD or IMMUNOTHERAPY ALERT CARD at check-in to  the Emergency Department and triage nurse.  Should you have questions after your visit or need to cancel or reschedule your appointment, please contact [facility]  Dept: [phone]  and follow the prompts.  Office hours are 8:00 a.m. to 4:30 p.m. Monday - Friday. Please note that voicemails left after 4:00 p.m. may not be returned until the following business day.  We are closed weekends and major holidays. You have access to a nurse at all times for urgent questions. Please call the main number to the clinic Dept: [phone] and follow the prompts.   For any non-urgent questions, you may also contact your provider using MyChart. We now offer e-Visits for anyone 87 and older to request care online for non-urgent symptoms. For details visit [email or web address].   Also download the MyChart app! Go to the app store, search "MyChart", open the app, select [facility], and log in with your MyChart username and password.  Masks are optional in the cancer centers. If you would like for your care team to wear a mask while they are taking care of you, please let them know. You may have one support person who is at least 79 years old accompany you for your appointments.

## 2022-01-19 NOTE — Progress Notes (Signed)
[facility] [phone]   [phone]  OFFICE PROGRESS NOTE  [name], MD [place] [address]  DIAGNOSIS: Stage IIIC (T3, N3, M0) non-small cell lung cancer, adenocarcinoma presented with right upper lobe lung mass status post right upper lobe wedge resection but no lymph node dissection.  The tumor measured 3.3 cm with visceral pleural involvement and very close resection margin less than 0.1 cm from the tumor.  This was performed on May 13, 2021 in the same setting of CABG under the care of Dr. Kipp Brood.  PET scan performed in March 2023 showed a right upper lobe nodule adjacent to the wedge resection site small hypermetabolic right hilar, right subcarinal, and left infrahilar lymph nodes which could be reactive but are suspicious for nodal involvement.   Molecular studies showed no actionable mutations and PD-L1 expression was 0%   PRIOR THERAPY:  1) Right upper lobe wedge resection under the care of Dr. Kipp Brood on 05/13/2021. 2) Concurrent chemoradiation with weekly carboplatin for AUC of 2 and paclitaxel 45 Mg/M2.  First dose Aug 11, 2021.  Status post 6 cycles.  Last dose was given September 22, 2021   CURRENT THERAPY: Consolidation treatment with immunotherapy with Imfinzi 1500 Mg IV every 4 weeks.  First dose October 27, 2021.  Status post 3 cycles.  INTERVAL HISTORY: Kevin [name] 81 y.o. male returns to the clinic today for follow-up visit accompanied by his wife.  The patient is feeling fine today with no concerning complaints.  He denied having any chest pain, shortness of breath, cough or hemoptysis.  He denied having any fever or chills.  He has no nausea, vomiting, diarrhea or constipation.  He has no headache or visual changes.  He denied having any recent weight loss or night sweats.  He has been tolerating his treatment with Imfinzi fairly well.  He had repeat CT scan of the chest performed recently and he is here for  evaluation and discussion of his scan results.    MEDICAL HISTORY: Past Medical History:  Diagnosis Date   Arthritis    Coronary artery disease    Hyperlipidemia    SCC (squamous cell carcinoma) 05/28/2004   top of left hand curet x3 3f   SCC (squamous cell carcinoma) 09/13/2013   mid back tx cx3 58f  SCC (squamous cell carcinoma) 09/06/2018   left wrist tx with bx   Squamous cell carcinoma of skin 11/16/2003   left ear tip tx curet and cautery [id number]  ALLERGIES:  has No Known Allergies.  MEDICATIONS:  Current Outpatient Medications  Medication Sig Dispense Refill   aspirin EC 81 MG tablet Take 81 mg by mouth daily.     atorvastatin (LIPITOR) 80 MG tablet Take 1 tablet (80 mg total) by mouth daily. 90 tablet 3   Calcium Carb-Cholecalciferol (CALCIUM 600 + D PO) Take 1,200 mg by mouth daily.     Cholecalciferol (DIALYVITE VITAMIN D 5000) 125 MCG (5000 UT) capsule Take 5,000 Units by mouth daily.     clopidogrel (PLAVIX) 75 MG tablet Take 75 mg by mouth daily.     metoprolol tartrate (LOPRESSOR) 25 MG tablet Take 0.5 tablets (12.5 mg total) by mouth 2 (two) times daily. 180 tablet 3   nitroGLYCERIN (NITROSTAT) 0.4 MG SL tablet Place 0.4 mg under the tongue every 5 (five) minutes as needed for chest pain.     No current facility-administered medications for this visit.  SURGICAL HISTORY:  Past Surgical History:  Procedure Laterality Date   BRONCHIAL BIOPSY  07/22/2021   Procedure: BRONCHIAL BIOPSIES;  Surgeon: Collene Gobble, MD;  Location: [facility];  Service: Cardiopulmonary;;   BRONCHIAL BRUSHINGS  07/22/2021   Procedure: BRONCHIAL BRUSHINGS;  Surgeon: Collene Gobble, MD;  Location: [facility];  Service: Cardiopulmonary;;   BRONCHIAL NEEDLE ASPIRATION BIOPSY  07/22/2021   Procedure: BRONCHIAL NEEDLE ASPIRATION BIOPSIES;  Surgeon: Collene Gobble, MD;  Location: [facility];  Service: Cardiopulmonary;;   CORONARY ARTERY BYPASS GRAFT N/A 05/13/2021   Procedure:  CORONARY ARTERY BYPASS GRAFTING (CABG) TIMES 3  , ON PUMP, USING LEFT INTERNAL MAMMARY ARTERY AND RIGHT GREATER SAPHENOUS VEIN HARVESTED ENDOSCOPICALLY;  Surgeon: Lajuana Matte, MD;  Location: [facility];  Service: Open Heart Surgery;  Laterality: N/A;   ENDOVEIN HARVEST OF GREATER SAPHENOUS VEIN Right 05/13/2021   Procedure: ENDOVEIN HARVEST OF GREATER SAPHENOUS VEIN;  Surgeon: Lajuana Matte, MD;  Location: [facility];  Service: Open Heart Surgery;  Laterality: Right;   epidural     back pain   IR CT SPINE LTD  06/18/2020   IR KYPHO LUMBAR INC FX REDUCE BONE BX UNI/BIL CANNULATION INC/IMAGING  06/18/2020   LEFT HEART CATH AND CORONARY ANGIOGRAPHY N/A 05/06/2021   Procedure: LEFT HEART CATH AND CORONARY ANGIOGRAPHY;  Surgeon: [name], MD;  Location: [facility];  Service: Cardiovascular;  Laterality: N/A;   LUNG BIOPSY Right 05/13/2021   Procedure: RIGHT LUNG BIOPSY;  Surgeon: Lajuana Matte, MD;  Location: [facility];  Service: Open Heart Surgery;  Laterality: Right;   MINOR HEMORRHOIDECTOMY     SHOULDER SURGERY     TEE WITHOUT CARDIOVERSION N/A 05/13/2021   Procedure: TRANSESOPHAGEAL ECHOCARDIOGRAM (TEE);  Surgeon: Lajuana Matte, MD;  Location: [facility];  Service: Open Heart Surgery;  Laterality: N/A;   TRANSURETHRAL RESECTION OF BLADDER TUMOR  2014   TRANSURETHRAL RESECTION OF BLADDER TUMOR N/A 05/19/2018   Procedure: TRANSURETHRAL RESECTION OF BLADDER TUMOR (TURBT) AND (INTRAVESICAL GEMCITABINE INSTILLED IN BLADDER IN PACU) ;  Surgeon: Franchot Gallo, MD;  Location: [facility];  Service: Urology;  Laterality: N/A;  [place] Bilateral 07/22/2021   Procedure: VIDEO BRONCHOSCOPY WITH ENDOBRONCHIAL ULTRASOUND;  Surgeon: Collene Gobble, MD;  Location: [facility];  Service: Cardiopulmonary;  Laterality: Bilateral;    REVIEW OF SYSTEMS:  Constitutional: negative Eyes: negative Ears, nose, mouth, throat, and face: negative Respiratory:  negative Cardiovascular: negative Gastrointestinal: negative Genitourinary:negative Integument/breast: negative Hematologic/lymphatic: negative Musculoskeletal:negative Neurological: negative Behavioral/Psych: negative Endocrine: negative Allergic/Immunologic: negative   PHYSICAL EXAMINATION: General appearance: alert, cooperative, and no distress Head: Normocephalic, without obvious abnormality, atraumatic Neck: no adenopathy, no JVD, supple, symmetrical, trachea midline, and thyroid not enlarged, symmetric, no tenderness/mass/nodules Lymph nodes: Cervical, supraclavicular, and axillary nodes normal. Resp: clear to auscultation bilaterally Back: symmetric, no curvature. ROM normal. No CVA tenderness. Cardio: regular rate and rhythm, S1, S2 normal, no murmur, click, rub or gallop GI: soft, non-tender; bowel sounds normal; no masses,  no organomegaly Extremities: extremities normal, atraumatic, no cyanosis or edema Neurologic: Alert and oriented X 3, normal strength and tone. Normal symmetric reflexes. Normal coordination and gait  ECOG PERFORMANCE STATUS: 0 - Asymptomatic  Blood pressure 124/71, pulse 78, temperature 97.7 F (36.5 C), temperature source Oral, resp. rate 14, weight 171 lb 6.4 oz (77.7 kg), SpO2 97 %.  LABORATORY DATA: Lab Results  Component Value Date   WBC 5.3 01/19/2022   HGB 11.5 (  L) 01/19/2022   HCT 34.2 (L) 01/19/2022   MCV 92.7 01/19/2022   PLT 162 01/19/2022      Chemistry      Component Value Date/Time   NA 136 01/15/2022 1101   NA 139 05/01/2021 1146   K 4.3 01/15/2022 1101   CL 103 01/15/2022 1101   CO2 28 01/15/2022 1101   BUN 16 01/15/2022 1101   BUN 20 05/01/2021 1146   CREATININE 1.31 (H) 01/15/2022 1101      Component Value Date/Time   CALCIUM 9.1 01/15/2022 1101   ALKPHOS 77 01/15/2022 1101   AST 11 (L) 01/15/2022 1101   ALT 7 01/15/2022 1101   BILITOT 1.0 01/15/2022 1101       RADIOGRAPHIC STUDIES: CT Chest W  Contrast  Result Date: 01/16/2022 CLINICAL DATA:  Non-small cell lung cancer staging; * Tracking Code: BO * EXAM: CT CHEST WITH CONTRAST TECHNIQUE: Multidetector CT imaging of the chest was performed during intravenous contrast administration. RADIATION DOSE REDUCTION: This exam was performed according to the departmental dose-optimization program which includes automated exposure control, adjustment of the mA and/or kV according to patient size and/or use of iterative reconstruction technique. CONTRAST:  15m OMNIPAQUE IOHEXOL 300 MG/ML  SOLN COMPARISON:  None Available. FINDINGS: Cardiovascular: Mild cardiomegaly. Severe left main and three-vessel coronary artery calcifications. Normal caliber thoracic aorta with severe atherosclerotic disease. Mediastinum/Nodes: Esophagus and thyroid are unremarkable. No pathologically enlarged lymph nodes seen in the chest. Lungs/Pleura: Central airways are patent. Severe paraseptal and centrilobular emphysema. Right upper lobe pulmonary nodule located adjacent to right upper lobe wedge resection staple line is unchanged in size when compared with prior exam, measures 3.0 x 1.3 cm on series 6, image 53. New linear ground-glass opacities of the superior right lower lobe, likely postradiation change. Mild ground-glass opacities of the left lower lobe seen on series 2, image 89, likely infectious or inflammatory. New linear nodular opacity of the right lower lobe located on series 6, image 123. New trace right pleural effusion. Upper Abdomen: Gallstones.  No acute abnormality. Musculoskeletal: Prior median sternotomy with intact sternal wires. No aggressive appearing osseous lesions. IMPRESSION: 1. Right upper lobe pulmonary nodule located adjacent to right upper lobe wedge resection staple line is unchanged in size when compared with prior exam. 2. New linear ground-glass opacities of the superior right lower lobe, likely postradiation change. 3. New linear nodular opacity of  the right lower lobe, scarring or atelectasis. Recommend attention on follow-up. 4. Mild ground-glass opacities of the left lower lobe, likely infectious or inflammatory. 5. New trace right pleural effusion. 6. Aortic Atherosclerosis (ICD10-I70.0) and Emphysema (ICD10-J43.9). Electronically Signed   By: LYetta GlassmanM.D.   On: 01/16/2022 17:47    ASSESSMENT AND PLAN: This is a very pleasant 81years old white male diagnosed with stage IIIC (T3, N3, M0) non-small cell lung cancer, adenocarcinoma with no actionable mutations and PD-L1 expression of 0% diagnosed in January 2023 status post wedge resection of the right upper lobe lung mass in a setting of CABG under the care of Dr. LKipp Broodbut the PET scan showed residual disease in the right upper lobe in addition to hypermetabolic right hilar, subcarinal and left infrahilar lymph nodes. The patient was seen by Dr. BLamonte Sakaiand biopsy of the right upper lobe as well as the subcarinal and left hilar lymph nodes were positive for malignancy consistent with non-small cell lung cancer, adenocarcinoma. He underwent a course of concurrent chemoradiation with weekly carboplatin for AUC of 2 and  paclitaxel 45 Mg/M2 status post 6 cycles.  The patient tolerated this previous course of concurrent chemoradiation fairly well except for fatigue and mild odynophagia. The patient is currently undergoing consolidation treatment with immunotherapy with Imfinzi 1500 Mg IV every 4 weeks status post 3 cycles.    The patient has been tolerating this treatment well with no concerning adverse effects. He had repeat CT scan of the chest performed recently.  I personally and independently reviewed the scan images and discussed the result with the patient and his wife. His scan showed no concerning findings for disease progression. I recommended for him to continue his current treatment with Imfinzi and he will proceed with cycle #4 today. I will see the patient back for follow-up  visit in 4 weeks for evaluation before the next cycle of his treatment. The patient was advised to call immediately if he has any other concerning symptoms in the interval. The patient voices understanding of current disease status and treatment options and is in agreement with the current care plan. All questions were answered. The patient knows to call the clinic with any problems, questions or concerns. We can certainly see the patient much sooner if necessary. The total time spent in the appointment was 30 minutes.   Disclaimer: This note was dictated with voice recognition software. Similar sounding words can inadvertently be transcribed and may not be corrected upon review.

## 2022-01-20 ENCOUNTER — Encounter: Payer: Self-pay | Admitting: Internal Medicine

## 2022-01-20 ENCOUNTER — Encounter: Payer: Self-pay | Admitting: Physician Assistant

## 2022-01-23 DIAGNOSIS — R059 Cough, unspecified: Secondary | ICD-10-CM | POA: Diagnosis not present

## 2022-01-23 DIAGNOSIS — I25119 Atherosclerotic heart disease of native coronary artery with unspecified angina pectoris: Secondary | ICD-10-CM | POA: Diagnosis not present

## 2022-01-23 DIAGNOSIS — N1831 Chronic kidney disease, stage 3a: Secondary | ICD-10-CM | POA: Diagnosis not present

## 2022-01-23 DIAGNOSIS — Z299 Encounter for prophylactic measures, unspecified: Secondary | ICD-10-CM | POA: Diagnosis not present

## 2022-01-23 DIAGNOSIS — I10 Essential (primary) hypertension: Secondary | ICD-10-CM | POA: Diagnosis not present

## 2022-02-03 ENCOUNTER — Other Ambulatory Visit: Payer: Medicare HMO | Admitting: Urology

## 2022-02-14 ENCOUNTER — Emergency Department (HOSPITAL_COMMUNITY): Payer: Medicare HMO

## 2022-02-14 ENCOUNTER — Encounter (HOSPITAL_COMMUNITY): Payer: Self-pay

## 2022-02-14 ENCOUNTER — Inpatient Hospital Stay (HOSPITAL_COMMUNITY)
Admission: EM | Admit: 2022-02-14 | Discharge: 2022-02-16 | DRG: 418 | Disposition: A | Discharge status: Home / Self Care | Payer: Medicare HMO | Attending: Internal Medicine | Admitting: Internal Medicine

## 2022-02-14 ENCOUNTER — Inpatient Hospital Stay (HOSPITAL_COMMUNITY): Payer: Medicare HMO

## 2022-02-14 ENCOUNTER — Other Ambulatory Visit: Payer: Self-pay

## 2022-02-14 DIAGNOSIS — R112 Nausea with vomiting, unspecified: Secondary | ICD-10-CM | POA: Diagnosis present

## 2022-02-14 DIAGNOSIS — R109 Unspecified abdominal pain: Secondary | ICD-10-CM | POA: Diagnosis not present

## 2022-02-14 DIAGNOSIS — I2510 Atherosclerotic heart disease of native coronary artery without angina pectoris: Secondary | ICD-10-CM | POA: Diagnosis not present

## 2022-02-14 DIAGNOSIS — Z7982 Long term (current) use of aspirin: Secondary | ICD-10-CM

## 2022-02-14 DIAGNOSIS — R1033 Periumbilical pain: Secondary | ICD-10-CM | POA: Diagnosis not present

## 2022-02-14 DIAGNOSIS — D649 Anemia, unspecified: Secondary | ICD-10-CM | POA: Diagnosis present

## 2022-02-14 DIAGNOSIS — Z85828 Personal history of other malignant neoplasm of skin: Secondary | ICD-10-CM

## 2022-02-14 DIAGNOSIS — K810 Acute cholecystitis: Secondary | ICD-10-CM | POA: Diagnosis not present

## 2022-02-14 DIAGNOSIS — Z79899 Other long term (current) drug therapy: Secondary | ICD-10-CM | POA: Diagnosis not present

## 2022-02-14 DIAGNOSIS — R1110 Vomiting, unspecified: Secondary | ICD-10-CM | POA: Diagnosis not present

## 2022-02-14 DIAGNOSIS — R1013 Epigastric pain: Secondary | ICD-10-CM | POA: Diagnosis not present

## 2022-02-14 DIAGNOSIS — C3491 Malignant neoplasm of unspecified part of right bronchus or lung: Secondary | ICD-10-CM | POA: Diagnosis not present

## 2022-02-14 DIAGNOSIS — Z87891 Personal history of nicotine dependence: Secondary | ICD-10-CM

## 2022-02-14 DIAGNOSIS — R739 Hyperglycemia, unspecified: Secondary | ICD-10-CM | POA: Diagnosis not present

## 2022-02-14 DIAGNOSIS — R1011 Right upper quadrant pain: Secondary | ICD-10-CM | POA: Diagnosis not present

## 2022-02-14 DIAGNOSIS — E782 Mixed hyperlipidemia: Secondary | ICD-10-CM | POA: Insufficient documentation

## 2022-02-14 DIAGNOSIS — K819 Cholecystitis, unspecified: Secondary | ICD-10-CM

## 2022-02-14 DIAGNOSIS — K8000 Calculus of gallbladder with acute cholecystitis without obstruction: Principal | ICD-10-CM | POA: Diagnosis present

## 2022-02-14 DIAGNOSIS — K8020 Calculus of gallbladder without cholecystitis without obstruction: Secondary | ICD-10-CM | POA: Diagnosis not present

## 2022-02-14 DIAGNOSIS — Z8249 Family history of ischemic heart disease and other diseases of the circulatory system: Secondary | ICD-10-CM

## 2022-02-14 DIAGNOSIS — Z951 Presence of aortocoronary bypass graft: Secondary | ICD-10-CM

## 2022-02-14 DIAGNOSIS — I700 Atherosclerosis of aorta: Secondary | ICD-10-CM | POA: Diagnosis not present

## 2022-02-14 DIAGNOSIS — C3490 Malignant neoplasm of unspecified part of unspecified bronchus or lung: Secondary | ICD-10-CM | POA: Diagnosis present

## 2022-02-14 DIAGNOSIS — R079 Chest pain, unspecified: Secondary | ICD-10-CM | POA: Diagnosis not present

## 2022-02-14 LAB — MAGNESIUM: Magnesium: 1.8 mg/dL (ref 1.7–2.4)

## 2022-02-14 LAB — COMPREHENSIVE METABOLIC PANEL
ALT: 15 U/L (ref 0–44)
AST: 17 U/L (ref 15–41)
Albumin: 4.2 g/dL (ref 3.5–5.0)
Alkaline Phosphatase: 90 U/L (ref 38–126)
Anion gap: 7 (ref 5–15)
BUN: 18 mg/dL (ref 8–23)
CO2: 25 mmol/L (ref 22–32)
Calcium: 9.3 mg/dL (ref 8.9–10.3)
Chloride: 103 mmol/L (ref 98–111)
Creatinine, Ser: 1.25 mg/dL — ABNORMAL HIGH (ref 0.61–1.24)
GFR, Estimated: 59 mL/min — ABNORMAL LOW (ref >60)
Glucose, Bld: 147 mg/dL — ABNORMAL HIGH (ref 70–99)
Potassium: 4.1 mmol/L (ref 3.5–5.1)
Sodium: 135 mmol/L (ref 135–145)
Total Bilirubin: 1.4 mg/dL — ABNORMAL HIGH (ref 0.3–1.2)
Total Protein: 7.7 g/dL (ref 6.5–8.1)

## 2022-02-14 LAB — URINALYSIS, ROUTINE W REFLEX MICROSCOPIC
Bilirubin Urine: NEGATIVE
Glucose, UA: 50 mg/dL — AB
Ketones, ur: 5 mg/dL — AB
Leukocytes,Ua: NEGATIVE
Nitrite: NEGATIVE
Protein, ur: 30 mg/dL — AB
Specific Gravity, Urine: 1.014 (ref 1.005–1.030)
pH: 7 (ref 5.0–8.0)

## 2022-02-14 LAB — PHOSPHORUS: Phosphorus: 3.5 mg/dL (ref 2.5–4.6)

## 2022-02-14 LAB — CBC
HCT: 37.6 % — ABNORMAL LOW (ref 39.0–52.0)
Hemoglobin: 12.3 g/dL — ABNORMAL LOW (ref 13.0–17.0)
MCH: 29.9 pg (ref 26.0–34.0)
MCHC: 32.7 g/dL (ref 30.0–36.0)
MCV: 91.5 fL (ref 80.0–100.0)
Platelets: 181 10*3/uL (ref 150–400)
RBC: 4.11 MIL/uL — ABNORMAL LOW (ref 4.22–5.81)
RDW: 13.5 % (ref 11.5–15.5)
WBC: 8.3 10*3/uL (ref 4.0–10.5)
nRBC: 0 % (ref 0.0–0.2)

## 2022-02-14 LAB — LIPASE, BLOOD: Lipase: 30 U/L (ref 11–51)

## 2022-02-14 LAB — TROPONIN I (HIGH SENSITIVITY)
Troponin I (High Sensitivity): 7 ng/L (ref <18)
Troponin I (High Sensitivity): 8 ng/L (ref <18)

## 2022-02-14 MED ORDER — LACTATED RINGERS IV SOLN: INTRAVENOUS | Status: DC

## 2022-02-14 MED ORDER — HYDROMORPHONE HCL 1 MG/ML IJ SOLN
1.0000 mg | Freq: Once | INTRAMUSCULAR | Status: AC
  Administered 2022-02-14: 1 mg via INTRAVENOUS
  Filled 2022-02-14: qty 1

## 2022-02-14 MED ORDER — FAMOTIDINE IN NACL 20-0.9 MG/50ML-% IV SOLN
20.0000 mg | Freq: Once | INTRAVENOUS | Status: AC
  Administered 2022-02-14: 20 mg via INTRAVENOUS
  Filled 2022-02-14: qty 50

## 2022-02-14 MED ORDER — ACETAMINOPHEN 650 MG RE SUPP: 650.0000 mg | Freq: Four times a day (QID) | RECTAL | Status: DC | PRN

## 2022-02-14 MED ORDER — PIPERACILLIN-TAZOBACTAM 3.375 G IVPB
3.3750 g | Freq: Three times a day (TID) | INTRAVENOUS | Status: DC
  Administered 2022-02-14 – 2022-02-16 (×7): 3.375 g via INTRAVENOUS
  Filled 2022-02-14 (×6): qty 50

## 2022-02-14 MED ORDER — SODIUM CHLORIDE 0.9 % IV BOLUS
500.0000 mL | Freq: Once | INTRAVENOUS | Status: AC
  Administered 2022-02-14: 500 mL via INTRAVENOUS

## 2022-02-14 MED ORDER — CHLORHEXIDINE GLUCONATE CLOTH 2 % EX PADS
6.0000 | MEDICATED_PAD | Freq: Once | CUTANEOUS | Status: AC
  Administered 2022-02-14: 6 via TOPICAL

## 2022-02-14 MED ORDER — FENTANYL CITRATE PF 50 MCG/ML IJ SOSY
50.0000 ug | PREFILLED_SYRINGE | Freq: Once | INTRAMUSCULAR | Status: AC
  Administered 2022-02-14: 50 ug via INTRAVENOUS
  Filled 2022-02-14: qty 1

## 2022-02-14 MED ORDER — SODIUM CHLORIDE 0.9 % IV SOLN
2.0000 g | INTRAVENOUS | Status: AC
  Administered 2022-02-15: 2 g via INTRAVENOUS
  Filled 2022-02-14 (×2): qty 2

## 2022-02-14 MED ORDER — ACETAMINOPHEN 325 MG PO TABS: 650.0000 mg | ORAL_TABLET | Freq: Four times a day (QID) | ORAL | Status: DC | PRN

## 2022-02-14 MED ORDER — CHLORHEXIDINE GLUCONATE CLOTH 2 % EX PADS
6.0000 | MEDICATED_PAD | Freq: Once | CUTANEOUS | Status: AC
  Administered 2022-02-15: 6 via TOPICAL

## 2022-02-14 MED ORDER — PROCHLORPERAZINE EDISYLATE 10 MG/2ML IJ SOLN: 10.0000 mg | Freq: Four times a day (QID) | INTRAMUSCULAR | Status: DC | PRN

## 2022-02-14 MED ORDER — PIPERACILLIN-TAZOBACTAM 3.375 G IVPB 30 MIN
3.3750 g | Freq: Once | INTRAVENOUS | Status: AC
  Administered 2022-02-14: 3.375 g via INTRAVENOUS
  Filled 2022-02-14: qty 50

## 2022-02-14 MED ORDER — ALUM & MAG HYDROXIDE-SIMETH 200-200-20 MG/5ML PO SUSP
30.0000 mL | Freq: Once | ORAL | Status: AC
  Administered 2022-02-14: 30 mL via ORAL
  Filled 2022-02-14: qty 30

## 2022-02-14 MED ORDER — MORPHINE SULFATE (PF) 2 MG/ML IV SOLN: 2.0000 mg | INTRAVENOUS | Status: DC | PRN

## 2022-02-14 MED ORDER — IOHEXOL 300 MG/ML  SOLN
100.0000 mL | Freq: Once | INTRAMUSCULAR | Status: AC | PRN
  Administered 2022-02-14: 100 mL via INTRAVENOUS

## 2022-02-14 MED ORDER — ONDANSETRON HCL 4 MG/2ML IJ SOLN
4.0000 mg | Freq: Once | INTRAMUSCULAR | Status: AC
  Administered 2022-02-14: 4 mg via INTRAVENOUS
  Filled 2022-02-14: qty 2

## 2022-02-14 NOTE — Consult Note (Signed)
[facility] Consult  Reason for Consult: Acute cholecystitis  Referring Physician: Dr. Manuella Ghazi  Chief Complaint   Abdominal Pain     HPI: Kevin [name] is a 81 y.o. male with adenocarcinoma of the lung s/p wedge and chemoradiation this past spring summer and now on immunotherapy with Imfinzi. This was all found after cardiac catheterization and CABG with three vessel bypass. He has been doing well with regards to his heart and lungs. He reports that in the last week he has been having abdominal pain and nausea and vomiting.  He was seen in the ED and found to have a distended gallbladder with concern for cholecystitis on CT. [place] this AM confirmed these findings. He is feeling better after antibiotics were started.  Him and his family are worried about his Imfinzi infusion he is due for Monday. I have sent Dr. Ewing Schlein a message regarding his surgery and need to reschedule the immunotherapy.   No chest pain or SOB.   Past Medical History:  Diagnosis Date   Arthritis    Coronary artery disease    Hyperlipidemia    SCC (squamous cell carcinoma) 05/28/2004   top of left hand curet x3 62fu   SCC (squamous cell carcinoma) 09/13/2013   mid back tx cx3 50fu   SCC (squamous cell carcinoma) 09/06/2018   left wrist tx with bx   Squamous cell carcinoma of skin 11/16/2003   left ear tip tx curet and cautery 46fu    Past Surgical History:  Procedure Laterality Date   BRONCHIAL BIOPSY  07/22/2021   Procedure: BRONCHIAL BIOPSIES;  Surgeon: Collene Gobble, MD;  Location: [place];  Service: Cardiopulmonary;;   BRONCHIAL BRUSHINGS  07/22/2021   Procedure: BRONCHIAL BRUSHINGS;  Surgeon: Collene Gobble, MD;  Location: [place];  Service: Cardiopulmonary;;   BRONCHIAL NEEDLE ASPIRATION BIOPSY  07/22/2021   Procedure: BRONCHIAL NEEDLE ASPIRATION BIOPSIES;  Surgeon: Collene Gobble, MD;  Location: [place];  Service: Cardiopulmonary;;   CORONARY ARTERY BYPASS GRAFT N/A  05/13/2021   Procedure: CORONARY ARTERY BYPASS GRAFTING (CABG) TIMES 3  , ON PUMP, USING LEFT INTERNAL MAMMARY ARTERY AND RIGHT GREATER SAPHENOUS VEIN HARVESTED ENDOSCOPICALLY;  Surgeon: Lajuana Matte, MD;  Location: [place];  Service: Open Heart Surgery;  Laterality: N/A;   ENDOVEIN HARVEST OF GREATER SAPHENOUS VEIN Right 05/13/2021   Procedure: ENDOVEIN HARVEST OF GREATER SAPHENOUS VEIN;  Surgeon: Lajuana Matte, MD;  Location: [place];  Service: Open Heart Surgery;  Laterality: Right;   epidural     back pain   IR CT SPINE LTD  06/18/2020   IR KYPHO LUMBAR INC FX REDUCE BONE BX UNI/BIL CANNULATION INC/IMAGING  06/18/2020   LEFT HEART CATH AND CORONARY ANGIOGRAPHY N/A 05/06/2021   Procedure: LEFT HEART CATH AND CORONARY ANGIOGRAPHY;  Surgeon: [name], MD;  Location: [place] [facility];  Service: Cardiovascular;  Laterality: N/A;   LUNG BIOPSY Right 05/13/2021   Procedure: RIGHT LUNG BIOPSY;  Surgeon: Lajuana Matte, MD;  Location: [place];  Service: Open Heart Surgery;  Laterality: Right;   MINOR HEMORRHOIDECTOMY     SHOULDER SURGERY     TEE WITHOUT CARDIOVERSION N/A 05/13/2021   Procedure: TRANSESOPHAGEAL ECHOCARDIOGRAM (TEE);  Surgeon: Lajuana Matte, MD;  Location: [place];  Service: Open Heart Surgery;  Laterality: N/A;   TRANSURETHRAL RESECTION OF BLADDER TUMOR  2014   TRANSURETHRAL RESECTION OF BLADDER TUMOR N/A 05/19/2018   Procedure: TRANSURETHRAL RESECTION OF BLADDER TUMOR (TURBT) AND (INTRAVESICAL GEMCITABINE INSTILLED  IN BLADDER IN PACU) ;  Surgeon: Franchot Gallo, MD;  Location: [facility];  Service: Urology;  Laterality: N/A;  [place] Bilateral 07/22/2021   Procedure: VIDEO BRONCHOSCOPY WITH ENDOBRONCHIAL ULTRASOUND;  Surgeon: Collene Gobble, MD;  Location: [facility];  Service: Cardiopulmonary;  Laterality: Bilateral;    Family History  Problem Relation Age of Onset   Alzheimer's disease Mother 54   Congestive  Heart Failure Father 53   Heart disease Brother 99   Heart failure Brother 66   Heart failure Brother 33    Social History   Tobacco Use   Smoking status: Former    Packs/day: 1.00    Years: 30.00    Total pack years: 30.00    Types: Cigarettes    Quit date: 04/14/1995    Years since quitting: 26.8   Smokeless tobacco: Never  Vaping Use   Vaping Use: Never used  Substance Use Topics   Alcohol use: Never   Drug use: Never    Medications: I have reviewed the patient's current medications. Prior to Admission:  Medications Prior to Admission  Medication Sig Dispense Refill Last Dose   aspirin EC 81 MG tablet Take 81 mg by mouth daily.   02/13/2022   atorvastatin (LIPITOR) 80 MG tablet Take 1 tablet (80 mg total) by mouth daily. 90 tablet 3 02/13/2022   benzonatate (TESSALON) 100 MG capsule Take 100 mg by mouth 3 (three) times daily.   unknown   Calcium Carb-Cholecalciferol (CALCIUM 600 + D PO) Take 1,200 mg by mouth daily.   02/13/2022   Cholecalciferol (DIALYVITE VITAMIN D 5000) 125 MCG (5000 UT) capsule Take 5,000 Units by mouth daily.   02/13/2022   metoprolol tartrate (LOPRESSOR) 25 MG tablet Take 0.5 tablets (12.5 mg total) by mouth 2 (two) times daily. 180 tablet 3 02/13/2022 at 0900   nitroGLYCERIN (NITROSTAT) 0.4 MG SL tablet Place 0.4 mg under the tongue every 5 (five) minutes as needed for chest pain.   unknown   Scheduled: Continuous:  lactated ringers 100 mL/hr at 02/14/22 0311   piperacillin-tazobactam (ZOSYN)  IV 3.375 g (02/14/22 0956)   YKD:[id number] **OR** acetaminophen, morphine injection, prochlorperazine  No Known Allergies   ROS:  A comprehensive review of systems was negative except for: Gastrointestinal: positive for abdominal pain, nausea, and vomiting  Blood pressure 123/71, pulse 99, temperature 99.5 F (37.5 C), temperature source Oral, resp. rate 20, height 5\' 10"  (1.778 m), weight 77.7 kg, SpO2 97 %. Physical Exam Vitals reviewed.   Constitutional:      Appearance: He is well-developed.  HENT:     Head: Normocephalic.  Cardiovascular:     Rate and Rhythm: Normal rate.  Pulmonary:     Effort: Pulmonary effort is normal.  Abdominal:     Palpations: Abdomen is soft.     Tenderness: There is abdominal tenderness in the right upper quadrant and epigastric area. There is no guarding or rebound.  Skin:    General: Skin is warm.  Neurological:     General: No focal deficit present.     Mental Status: He is alert and oriented to person, place, and time.  Psychiatric:        Mood and Affect: Mood normal.        Behavior: Behavior normal.     Results: Results for orders placed or performed during the hospital encounter of 02/14/22 (from the past 48 hour(s))  Urinalysis, Routine w reflex microscopic Urine, Clean  Catch     Status: Abnormal   Collection Time: 02/14/22  1:18 AM  Result Value Ref [place]   Color, Urine YELLOW YELLOW   APPearance HAZY (A) CLEAR   Specific Gravity, Urine 1.014 1.005 - 1.030   pH 7.0 5.0 - 8.0   Glucose, UA 50 (A) NEGATIVE mg/dL   Hgb urine dipstick SMALL (A) NEGATIVE   Bilirubin Urine NEGATIVE NEGATIVE   Ketones, ur 5 (A) NEGATIVE mg/dL   Protein, ur 30 (A) NEGATIVE mg/dL   Nitrite NEGATIVE NEGATIVE   Leukocytes,Ua NEGATIVE NEGATIVE   RBC / HPF 6-10 0 - 5 RBC/hpf   WBC, UA 0-5 0 - 5 WBC/hpf   Bacteria, UA RARE (A) NONE SEEN   Mucus PRESENT    Amorphous Crystal PRESENT     Comment: Performed at [facility], [address] [place], [place] [address]  CBC     Status: Abnormal   Collection Time: 02/14/22  1:20 AM  Result Value Ref [place]   WBC 8.3 4.0 - 10.5 K/uL   RBC 4.11 (L) 4.22 - 5.81 MIL/uL   Hemoglobin 12.3 (L) 13.0 - 17.0 g/dL   HCT 37.6 (L) 39.0 - 52.0 %   MCV 91.5 80.0 - 100.0 fL   MCH 29.9 26.0 - 34.0 pg   MCHC 32.7 30.0 - 36.0 g/dL   RDW 13.5 11.5 - 15.5 %   Platelets 181 150 - 400 K/uL   nRBC 0.0 0.0 - 0.2 %    Comment: Performed at [facility], [address], [place], [place] [address]  Comprehensive metabolic panel     Status: Abnormal   Collection Time: 02/14/22  1:20 AM  Result Value Ref [place]   Sodium 135 135 - 145 mmol/L   Potassium 4.1 3.5 - 5.1 mmol/L   Chloride 103 98 - 111 mmol/L   CO2 25 22 - 32 mmol/L   Glucose, Bld 147 (H) 70 - 99 mg/dL    Comment: Glucose reference range applies only to samples taken after fasting for at least 8 hours.   BUN 18 8 - 23 mg/dL   Creatinine, Ser 1.25 (H) 0.61 - 1.24 mg/dL   Calcium 9.3 8.9 - 10.3 mg/dL   Total Protein 7.7 6.5 - 8.1 g/dL   Albumin 4.2 3.5 - 5.0 g/dL   AST 17 15 - 41 U/L   ALT 15 0 - 44 U/L   Alkaline Phosphatase 90 38 - 126 U/L   Total Bilirubin 1.4 (H) 0.3 - 1.2 mg/dL   GFR, Estimated 59 (L) >60 mL/min    Comment: (NOTE) Calculated using the CKD-EPI Creatinine Equation (2021)    Anion gap 7 5 - 15    Comment: Performed at [facility], [address] [place], [place] [address]  Lipase, blood     Status: None   Collection Time: 02/14/22  1:20 AM  Result Value Ref [place]   Lipase 30 11 - 51 U/L    Comment: Performed at [facility], [address], [place],  [address]  Troponin I (High Sensitivity)     Status: None   Collection Time: 02/14/22  1:20 AM  Result Value Ref [place]   Troponin I (High Sensitivity) 7 <18 ng/L    Comment: (NOTE) Elevated high sensitivity troponin I (hsTnI) values and significant  changes across serial measurements may suggest ACS but many other  chronic and acute conditions are known to elevate hsTnI results.  Refer to the "Links" section for chest pain algorithms and  additional  guidance. Performed at [facility], [address], [place], [place] [address]   Troponin I (High Sensitivity)     Status: None   Collection Time: 02/14/22  3:08 AM  Result Value Ref [place]   Troponin I (High Sensitivity) 8 <18 ng/L    Comment: (NOTE) Elevated high sensitivity troponin I (hsTnI) values and significant  changes across serial measurements may suggest  ACS but many other  chronic and acute conditions are known to elevate hsTnI results.  Refer to the "Links" section for chest pain algorithms and additional  guidance. Performed at [facility], [address] [place], [place] [address]   Culture, blood (Routine X 2) w Reflex to ID Panel     Status: None (Preliminary result)   Collection Time: 02/14/22  5:44 AM   Specimen: Left Antecubital; Blood  Result Value Ref [place]   Specimen Description      LEFT ANTECUBITAL BOTTLES DRAWN AEROBIC AND ANAEROBIC   Special Requests      Blood Culture results may not be optimal due to an excessive volume of blood received in culture bottles   Culture      NO GROWTH <12 HOURS Performed at [facility], [address] [place], [place] [address]    Report Status PENDING   Magnesium     Status: None   Collection Time: 02/14/22  5:44 AM  Result Value Ref [place]   Magnesium 1.8 1.7 - 2.4 mg/dL    Comment: Performed at [facility], [address], [place], [place] [address]  Phosphorus     Status: None   Collection Time: 02/14/22  5:44 AM  Result Value Ref [place]   Phosphorus 3.5 2.5 - 4.6 mg/dL    Comment: Performed at [facility], [address] [place], [place] [address]  Culture, blood (Routine X 2) w Reflex to ID Panel     Status: None (Preliminary result)   Collection Time: 02/14/22  5:53 AM   Specimen: BLOOD LEFT HAND  Result Value Ref [place]   Specimen Description      BLOOD LEFT HAND BOTTLES DRAWN AEROBIC AND ANAEROBIC   Special Requests      Blood Culture results may not be optimal due to an excessive volume of blood received in culture bottles   Culture      NO GROWTH <12 HOURS Performed at [facility], [address] [place], [place] [address]    Report Status PENDING    Personally reviewed- large gallbladder with stone in neck and thickened wall  US Abdomen Limited RUQ (LIVER/GB)  Result Date: 02/14/2022 CLINICAL DATA:  Epigastric pain with nausea and vomiting. Cholelithiasis.  EXAM: ULTRASOUND ABDOMEN LIMITED RIGHT UPPER QUADRANT COMPARISON:  None Available. FINDINGS: Gallbladder: Gallbladder is distended and contains echogenic sludge. Gallstone in the gallbladder neck is better visualized on CT also performed today. Diffuse gallbladder wall thickening is seen measuring up to 6 mm. These findings are consistent with acute cholecystitis. Common bile duct: Diameter: 5 mm, within normal limits. Liver: No focal lesion identified. Within normal limits in parenchymal echogenicity. Portal vein is patent on color Doppler imaging with normal direction of blood flow towards the liver. Other: None. IMPRESSION: Distended gallbladder with small gallstone in the neck and diffuse gallbladder wall thickening, consistent with acute cholecystitis. No evidence of biliary ductal dilatation. Electronically Signed   By: Marlaine Hind M.D.   On: 02/14/2022 11:27   CT ABDOMEN PELVIS W CONTRAST  Result Date: 02/14/2022 CLINICAL DATA:  Mid and  epigastric pain with nausea and vomiting EXAM: CT ABDOMEN AND PELVIS WITH CONTRAST TECHNIQUE: Multidetector CT imaging of the abdomen and pelvis was performed using the standard protocol following bolus administration of intravenous contrast. RADIATION DOSE REDUCTION: This exam was performed according to the departmental dose-optimization program which includes automated exposure control, adjustment of the mA and/or kV according to patient size and/or use of iterative reconstruction technique. CONTRAST:  184mL OMNIPAQUE IOHEXOL 300 MG/ML  SOLN COMPARISON:  PET/CT 07/07/2021 FINDINGS: Lower chest: Emphysema. Motion obscures the lung bases. Scattered areas of scarring. No acute abnormality. Hepatobiliary: 7 mm gallstone within the cystic duct with mild adjacent stranding. Mild pericholecystic stranding. No intra hepatic biliary dilation. The common bile duct is normal in caliber. No suspicious hepatic lesion. Pancreas: Unremarkable. Spleen: Unremarkable. Adrenals/Urinary  Tract: Adrenal glands are unremarkable. Bilateral cortical renal scarring. Nonobstructing right nephrolithiasis. No ureteral stones or hydronephrosis. Unremarkable bladder. Stomach/Bowel: Unremarkable stomach. Normal caliber large and small bowel. Normal appendix. Colonic diverticulosis without diverticulitis. Vascular/Lymphatic: Aortic atherosclerosis. No enlarged abdominal or pelvic lymph nodes. Reproductive: Clips in the prostate gland. Coarse calcification in the prostate gland or bladder wall. Other: No free intraperitoneal fluid or air. Musculoskeletal: No acute or significant osseous findings. L4 vertebroplasty and height loss. IMPRESSION: Findings suggestive of acute cholecystitis secondary to 7 mm stone in the cystic duct. Aortic Atherosclerosis (ICD10-I70.0) and Emphysema (ICD10-J43.9). Electronically Signed   By: Placido Sou M.D.   On: 02/14/2022 02:33   DG Chest Port 1 View  Result Date: 02/14/2022 CLINICAL DATA:  Epigastric pain EXAM: PORTABLE CHEST 1 VIEW COMPARISON:  CT chest dated 01/15/2022 FINDINGS: Subpleural scarring in the lateral right upper lobe. Postsurgical changes related to right upper lobe wedge resection. Associated nodularity on recent prior CT is not radiographically evident. Left lung is clear. The heart is top-normal in size. Postsurgical changes related to prior CABG. Median sternotomy. IMPRESSION: Postsurgical changes related to right upper lobe wedge resection. Associated nodularity on recent prior CT is not radiographically evident. Adjacent subpleural scarring. No evidence of acute cardiopulmonary disease. Electronically Signed   By: Julian Hy M.D.   On: 02/14/2022 01:38     Assessment & Plan:  Shavar Gorka is a 81 y.o. male with acute cholecystitis. He is improving with antibiotics but has a lodged stone. He will need a laparoscopic cholecystectomy.  PLAN: I counseled the patient about the indication, risks and benefits of laparoscopic cholecystectomy.   He understands there is a very small chance for bleeding, infection, injury to normal structures (including common bile duct), conversion to open surgery, persistent symptoms, evolution of postcholecystectomy diarrhea, need for secondary interventions, anesthesia reaction, cardiopulmonary issues and other risks not specifically detailed here. I described the expected recovery, the plan for follow-up and the restrictions during the recovery phase.  All questions were answered.   All questions were answered to the satisfaction of the patient and family.  I have sent a message to Dr. Ewing Schlein about the surgery and delay in the infusion, since he will likely miss Monday's appt.   Virl Cagey 02/14/2022, 3:27 PM

## 2022-02-14 NOTE — H&P (Signed)
History and Physical    Patient: Kevin [name] DXI:[id number] DOB: [birth year] DOA: 02/14/2022 DOS: the patient was seen and examined on 02/14/2022 PCP: [name], MD  Patient coming from: Home  Chief Complaint:  Chief Complaint  Patient presents with   Abdominal Pain   HPI: Kevin [name] is a 81 y.o. male with medical history significant of lung cancer, CAD, hyperlipidemia who presents to the emergency department due to abdominal pain which started yesterday in the evening.  Patient complained of periumbilical and epigastric pain which was sharp in nature and was rated as 10/10 on pain scale, this was associated with nausea and nonbloody vomiting.  Abdominal pain was pursued, so he decided to go to the ED for further evaluation and management.  Patient denies fever, chest pain, chills, shortness of breath.  ED Course:  In the emergency department, patient was hemodynamically stable, BP was 157/80 and other vital signs were within normal range.  Work-up in the ED showed normocytic anemia, BMP was normal except for blood glucose of 147 and creatinine of 1.25 (creatinine is within baseline range), troponin x2 was negative, urinalysis was unimpressive for UTI, lipase 30. CT abdomen pelvis showed findings suggestive of acute cholecystitis secondary to 7 mm stone in the cystic duct Chest x-ray showed no evidence of acute cardiopulmonary disease Patient was treated with Pepcid, fentanyl was given for pain, patient was empirically started with IV Zosyn, GI cocktail was given, IV hydration was provided.  General surgery (Dr. Constance Haw) was consulted and recommended admitting patient with plan to see patient in the morning per ED physician  Review of Systems: Review of systems as noted in the HPI. All other systems reviewed and are negative.   Past Medical History:  Diagnosis Date   Arthritis    Coronary artery disease    Hyperlipidemia    SCC (squamous cell carcinoma) 05/28/2004   top of  left hand curet x3 57fu   SCC (squamous cell carcinoma) 09/13/2013   mid back tx cx3 52fu   SCC (squamous cell carcinoma) 09/06/2018   left wrist tx with bx   Squamous cell carcinoma of skin 11/16/2003   left ear tip tx curet and cautery 23fu   Past Surgical History:  Procedure Laterality Date   BRONCHIAL BIOPSY  07/22/2021   Procedure: BRONCHIAL BIOPSIES;  Surgeon: Collene Gobble, MD;  Location: [place];  Service: Cardiopulmonary;;   BRONCHIAL BRUSHINGS  07/22/2021   Procedure: BRONCHIAL BRUSHINGS;  Surgeon: Collene Gobble, MD;  Location: [place];  Service: Cardiopulmonary;;   BRONCHIAL NEEDLE ASPIRATION BIOPSY  07/22/2021   Procedure: BRONCHIAL NEEDLE ASPIRATION BIOPSIES;  Surgeon: Collene Gobble, MD;  Location: [place];  Service: Cardiopulmonary;;   CORONARY ARTERY BYPASS GRAFT N/A 05/13/2021   Procedure: CORONARY ARTERY BYPASS GRAFTING (CABG) TIMES 3  , ON PUMP, USING LEFT INTERNAL MAMMARY ARTERY AND RIGHT GREATER SAPHENOUS VEIN HARVESTED ENDOSCOPICALLY;  Surgeon: Lajuana Matte, MD;  Location: [place];  Service: Open Heart Surgery;  Laterality: N/A;   ENDOVEIN HARVEST OF GREATER SAPHENOUS VEIN Right 05/13/2021   Procedure: ENDOVEIN HARVEST OF GREATER SAPHENOUS VEIN;  Surgeon: Lajuana Matte, MD;  Location: [place];  Service: Open Heart Surgery;  Laterality: Right;   epidural     back pain   IR CT SPINE LTD  06/18/2020   IR KYPHO LUMBAR INC FX REDUCE BONE BX UNI/BIL CANNULATION INC/IMAGING  06/18/2020   LEFT HEART CATH AND CORONARY ANGIOGRAPHY N/A 05/06/2021   Procedure: LEFT HEART CATH AND  CORONARY ANGIOGRAPHY;  Surgeon: [name], MD;  Location: [place] [facility];  Service: Cardiovascular;  Laterality: N/A;   LUNG BIOPSY Right 05/13/2021   Procedure: RIGHT LUNG BIOPSY;  Surgeon: Lajuana Matte, MD;  Location: [place];  Service: Open Heart Surgery;  Laterality: Right;   MINOR HEMORRHOIDECTOMY     SHOULDER SURGERY     TEE WITHOUT CARDIOVERSION N/A 05/13/2021    Procedure: TRANSESOPHAGEAL ECHOCARDIOGRAM (TEE);  Surgeon: Lajuana Matte, MD;  Location: [place];  Service: Open Heart Surgery;  Laterality: N/A;   TRANSURETHRAL RESECTION OF BLADDER TUMOR  2014   TRANSURETHRAL RESECTION OF BLADDER TUMOR N/A 05/19/2018   Procedure: TRANSURETHRAL RESECTION OF BLADDER TUMOR (TURBT) AND (INTRAVESICAL GEMCITABINE INSTILLED IN BLADDER IN PACU) ;  Surgeon: Franchot Gallo, MD;  Location: [facility];  Service: Urology;  Laterality: N/A;  [place] Bilateral 07/22/2021   Procedure: VIDEO BRONCHOSCOPY WITH ENDOBRONCHIAL ULTRASOUND;  Surgeon: Collene Gobble, MD;  Location: [facility];  Service: Cardiopulmonary;  Laterality: Bilateral;    Social History:  reports that he quit smoking about 26 years ago. His smoking use included cigarettes. He has a 30.00 pack-year smoking history. He has never used smokeless tobacco. He reports that he does not drink alcohol and does not use drugs.   No Known Allergies  Family History  Problem Relation Age of Onset   Alzheimer's disease Mother 55   Congestive Heart Failure Father 51   Heart disease Brother 36   Heart failure Brother 52   Heart failure Brother 60     Prior to Admission medications   Medication Sig Start Date End Date Taking? Authorizing Provider  aspirin EC 81 MG tablet Take 81 mg by mouth daily.    [provider]  atorvastatin (LIPITOR) 80 MG tablet Take 1 tablet (80 mg total) by mouth daily. 07/09/21   [name], MD  Calcium Carb-Cholecalciferol (CALCIUM 600 + D PO) Take 1,200 mg by mouth daily.    [provider]  Cholecalciferol (DIALYVITE VITAMIN D 5000) 125 MCG (5000 UT) capsule Take 5,000 Units by mouth daily.    [provider]  metoprolol tartrate (LOPRESSOR) 25 MG tablet Take 0.5 tablets (12.5 mg total) by mouth 2 (two) times daily. 10/01/21   Cantwell, Celeste C, PA-C  nitroGLYCERIN (NITROSTAT) 0.4 MG SL tablet Place 0.4 mg  under the tongue every 5 (five) minutes as needed for chest pain. Patient not taking: Reported on 01/19/2022 04/09/21   [provider]    Physical Exam: BP (!) 139/94 (BP Location: Left Arm)   Pulse 65   Temp (!) 97.5 F (36.4 C) (Oral)   Resp 18   Ht 5\' 10"  (1.778 m)   Wt 77.7 kg   SpO2 100%   BMI 24.58 kg/m   General: 81 y.o. year-old male well developed well nourished in no acute distress.  Alert and oriented x3. HEENT: NCAT, EOMI Neck: Supple, trachea medial Cardiovascular: Regular rate and rhythm with no rubs or gallops.  No thyromegaly or JVD noted.  No lower extremity edema. 2/4 pulses in all 4 extremities. Respiratory: Clear to auscultation with no wheezes or rales. Good inspiratory effort. Abdomen: Soft, nontender nondistended with normal bowel sounds x4 quadrants. Muskuloskeletal: No cyanosis, clubbing or edema noted bilaterally Neuro: CN II-XII intact, strength 5/5 x 4, sensation, reflexes intact Skin: No ulcerative lesions noted or rashes Psychiatry: Judgement and insight appear normal. Mood is appropriate for condition and setting  Labs on Admission:  Basic Metabolic Panel: Recent Labs  Lab 02/14/22 0120  NA 135  K 4.1  CL 103  CO2 25  GLUCOSE 147*  BUN 18  CREATININE 1.25*  CALCIUM 9.3   Liver Function Tests: Recent Labs  Lab 02/14/22 0120  AST 17  ALT 15  ALKPHOS 90  BILITOT 1.4*  PROT 7.7  ALBUMIN 4.2   Recent Labs  Lab 02/14/22 0120  LIPASE 30   No results for input(s): "AMMONIA" in the last 168 hours. CBC: Recent Labs  Lab 02/14/22 0120  WBC 8.3  HGB 12.3*  HCT 37.6*  MCV 91.5  PLT 181   Cardiac Enzymes: No results for input(s): "CKTOTAL", "CKMB", "CKMBINDEX", "TROPONINI" in the last 168 hours.  BNP (last 3 results) No results for input(s): "BNP" in the last 8760 hours.  ProBNP (last 3 results) No results for input(s): "PROBNP" in the last 8760 hours.  CBG: No results for input(s): "GLUCAP" in the last  168 hours.  Radiological Exams on Admission: CT ABDOMEN PELVIS W CONTRAST  Result Date: 02/14/2022 CLINICAL DATA:  Mid and epigastric pain with nausea and vomiting EXAM: CT ABDOMEN AND PELVIS WITH CONTRAST TECHNIQUE: Multidetector CT imaging of the abdomen and pelvis was performed using the standard protocol following bolus administration of intravenous contrast. RADIATION DOSE REDUCTION: This exam was performed according to the departmental dose-optimization program which includes automated exposure control, adjustment of the mA and/or kV according to patient size and/or use of iterative reconstruction technique. CONTRAST:  11mL OMNIPAQUE IOHEXOL 300 MG/ML  SOLN COMPARISON:  PET/CT 07/07/2021 FINDINGS: Lower chest: Emphysema. Motion obscures the lung bases. Scattered areas of scarring. No acute abnormality. Hepatobiliary: 7 mm gallstone within the cystic duct with mild adjacent stranding. Mild pericholecystic stranding. No intra hepatic biliary dilation. The common bile duct is normal in caliber. No suspicious hepatic lesion. Pancreas: Unremarkable. Spleen: Unremarkable. Adrenals/Urinary Tract: Adrenal glands are unremarkable. Bilateral cortical renal scarring. Nonobstructing right nephrolithiasis. No ureteral stones or hydronephrosis. Unremarkable bladder. Stomach/Bowel: Unremarkable stomach. Normal caliber large and small bowel. Normal appendix. Colonic diverticulosis without diverticulitis. Vascular/Lymphatic: Aortic atherosclerosis. No enlarged abdominal or pelvic lymph nodes. Reproductive: Clips in the prostate gland. Coarse calcification in the prostate gland or bladder wall. Other: No free intraperitoneal fluid or air. Musculoskeletal: No acute or significant osseous findings. L4 vertebroplasty and height loss. IMPRESSION: Findings suggestive of acute cholecystitis secondary to 7 mm stone in the cystic duct. Aortic Atherosclerosis (ICD10-I70.0) and Emphysema (ICD10-J43.9). Electronically Signed   By:  Placido Sou M.D.   On: 02/14/2022 02:33   DG Chest Port 1 View  Result Date: 02/14/2022 CLINICAL DATA:  Epigastric pain EXAM: PORTABLE CHEST 1 VIEW COMPARISON:  CT chest dated 01/15/2022 FINDINGS: Subpleural scarring in the lateral right upper lobe. Postsurgical changes related to right upper lobe wedge resection. Associated nodularity on recent prior CT is not radiographically evident. Left lung is clear. The heart is top-normal in size. Postsurgical changes related to prior CABG. Median sternotomy. IMPRESSION: Postsurgical changes related to right upper lobe wedge resection. Associated nodularity on recent prior CT is not radiographically evident. Adjacent subpleural scarring. No evidence of acute cardiopulmonary disease. Electronically Signed   By: Julian Hy M.D.   On: 02/14/2022 01:38    EKG: I independently viewed the EKG done and my findings are as followed: Normal sinus rhythm at a rate of 89 bpm with APCs  Assessment/Plan Present on Admission:  Cholelithiasis with acute cholecystitis  Abdominal pain  Nausea & vomiting  Coronary  artery disease involving native coronary artery of native heart without angina pectoris  Adenocarcinoma, lung (HCC)  Principal Problem:   Cholelithiasis with acute cholecystitis Active Problems:   Coronary artery disease involving native coronary artery of native heart without angina pectoris   Adenocarcinoma, lung (HCC)   Abdominal pain   Nausea & vomiting   Mixed hyperlipidemia   Cholelithiasis with acute cholecystitis Abdominal pain, nausea and vomiting in the setting of above Patient will be admitted to [facility] Continue IV LR Continue IV Zosyn Continue IV morphine 2 mg q.4h p.r.n. for moderate to severe pain Continue IV Compazine p.r.n. Continue n.p.o. in anticipation for possible surgical procedure in the morning.   Obtain blood culture x2 Surgery was consulted to follow up with patient in the morning per ED physician  Hyperglycemia  possibly reactive Blood glucose of 47, patient has no history of T2DM Continue to monitor glucose level with morning labs  History of lung cancer Patient has adenocarcinoma of right lung stage III Patient is on durvalumab and filgrastim He follows with Dr. [name]  CAD Mixed hyperlipidemia Patient is currently n.p.o. Consider starting patient's home meds when he resumes oral intake  DVT prophylaxis: SCDs  Code Status: Full code  Family Communication: None at bedside  Consults: General surgery (by EDP)   Severity of Illness: The appropriate patient status for this patient is INPATIENT. Inpatient status is judged to be reasonable and necessary in order to provide the required intensity of service to ensure the patient's safety. The patient's presenting symptoms, physical exam findings, and initial radiographic and laboratory data in the context of their chronic comorbidities is felt to place them at high risk for further clinical deterioration. Furthermore, it is not anticipated that the patient will be medically stable for discharge from the hospital within 2 midnights of admission.   * I certify that at the point of admission it is my clinical judgment that the patient will require inpatient hospital care spanning beyond 2 midnights from the point of admission due to high intensity of service, high risk for further deterioration and high frequency of surveillance required.*  Author: Bernadette Hoit, DO 02/14/2022 5:13 AM  For on call review [email or web address].

## 2022-02-14 NOTE — TOC Progression Note (Signed)
  Transition of Care [facility]) Screening Note   Patient Details  Name: Kevin [name] Date of Birth: December 11, 1940   Transition of Care [facility]) CM/SW Contact:    Shade Flood, LCSW Phone Number: 02/14/2022, 10:35 AM    Transition of Care Department [facility]) has reviewed patient and no TOC needs have been identified at this time. We will continue to monitor patient advancement through interdisciplinary progression rounds. If new patient transition needs arise, please place a TOC consult.

## 2022-02-14 NOTE — ED Provider Notes (Signed)
[facility] Emergency Department Provider Note MRN:  [id number]  Arrival date & time: 02/14/22     Chief Complaint   Abdominal Pain   History of Present Illness   Kevin [name] is a 81 y.o. year-old male with a history of lung cancer, CAD presenting to the ED with chief complaint of abdominal pain.  Persistent abdominal pain for the past 7 or 8 hours, worst in the epigastrium but is located diffusely.  Associated with nausea and vomiting.  Not going away.  Denies fever, no chest pain or shortness of breath, no diarrhea.  Review of Systems  A thorough review of systems was obtained and all systems are negative except as noted in the HPI and PMH.   Patient's Health History    Past Medical History:  Diagnosis Date   Arthritis    Coronary artery disease    Hyperlipidemia    SCC (squamous cell carcinoma) 05/28/2004   top of left hand curet x3 53fu   SCC (squamous cell carcinoma) 09/13/2013   mid back tx cx3 27fu   SCC (squamous cell carcinoma) 09/06/2018   left wrist tx with bx   Squamous cell carcinoma of skin 11/16/2003   left ear tip tx curet and cautery 14fu    Past Surgical History:  Procedure Laterality Date   BRONCHIAL BIOPSY  07/22/2021   Procedure: BRONCHIAL BIOPSIES;  Surgeon: Collene Gobble, MD;  Location: [place];  Service: Cardiopulmonary;;   BRONCHIAL BRUSHINGS  07/22/2021   Procedure: BRONCHIAL BRUSHINGS;  Surgeon: Collene Gobble, MD;  Location: [place];  Service: Cardiopulmonary;;   BRONCHIAL NEEDLE ASPIRATION BIOPSY  07/22/2021   Procedure: BRONCHIAL NEEDLE ASPIRATION BIOPSIES;  Surgeon: Collene Gobble, MD;  Location: [place];  Service: Cardiopulmonary;;   CORONARY ARTERY BYPASS GRAFT N/A 05/13/2021   Procedure: CORONARY ARTERY BYPASS GRAFTING (CABG) TIMES 3  , ON PUMP, USING LEFT INTERNAL MAMMARY ARTERY AND RIGHT GREATER SAPHENOUS VEIN HARVESTED ENDOSCOPICALLY;  Surgeon: Lajuana Matte, MD;  Location: [place];  Service:  Open Heart Surgery;  Laterality: N/A;   ENDOVEIN HARVEST OF GREATER SAPHENOUS VEIN Right 05/13/2021   Procedure: ENDOVEIN HARVEST OF GREATER SAPHENOUS VEIN;  Surgeon: Lajuana Matte, MD;  Location: [place];  Service: Open Heart Surgery;  Laterality: Right;   epidural     back pain   IR CT SPINE LTD  06/18/2020   IR KYPHO LUMBAR INC FX REDUCE BONE BX UNI/BIL CANNULATION INC/IMAGING  06/18/2020   LEFT HEART CATH AND CORONARY ANGIOGRAPHY N/A 05/06/2021   Procedure: LEFT HEART CATH AND CORONARY ANGIOGRAPHY;  Surgeon: [name], MD;  Location: [place] [facility];  Service: Cardiovascular;  Laterality: N/A;   LUNG BIOPSY Right 05/13/2021   Procedure: RIGHT LUNG BIOPSY;  Surgeon: Lajuana Matte, MD;  Location: [place];  Service: Open Heart Surgery;  Laterality: Right;   MINOR HEMORRHOIDECTOMY     SHOULDER SURGERY     TEE WITHOUT CARDIOVERSION N/A 05/13/2021   Procedure: TRANSESOPHAGEAL ECHOCARDIOGRAM (TEE);  Surgeon: Lajuana Matte, MD;  Location: [place];  Service: Open Heart Surgery;  Laterality: N/A;   TRANSURETHRAL RESECTION OF BLADDER TUMOR  2014   TRANSURETHRAL RESECTION OF BLADDER TUMOR N/A 05/19/2018   Procedure: TRANSURETHRAL RESECTION OF BLADDER TUMOR (TURBT) AND (INTRAVESICAL GEMCITABINE INSTILLED IN BLADDER IN PACU) ;  Surgeon: Franchot Gallo, MD;  Location: [facility];  Service: Urology;  Laterality: N/A;  [place] Bilateral 07/22/2021   Procedure: VIDEO  BRONCHOSCOPY WITH ENDOBRONCHIAL ULTRASOUND;  Surgeon: Collene Gobble, MD;  Location: [facility] END[facility]COPY;  Service: Cardiopulmonary;  Laterality: Bilateral;    Family History  Problem Relation Age of Onset   Alzheimer's disease Mother 73   Congestive Heart Failure Father 20   Heart disease Brother 46   Heart failure Brother 82   Heart failure Brother 25    Social History   Socioeconomic History   Marital status: Married    Spouse name: Not on file   Number of children: 2   Years  of education: Not on file   Highest education level: Not on file  Occupational History   Not on file  Tobacco Use   Smoking status: Former    Packs/day: 1.00    Years: 30.00    Total pack years: 30.00    Types: Cigarettes    Quit date: 04/14/1995    Years since quitting: 26.8   Smokeless tobacco: Never  Vaping Use   Vaping Use: Never used  Substance and Sexual Activity   Alcohol use: Never   Drug use: Never   Sexual activity: Not on file  Other Topics Concern   Not on file  Social History Narrative   Not on file   Social Determinants of Health   Financial Resource Strain: Not on file  Food Insecurity: Not on file  Transportation Needs: Not on file  Physical Activity: Not on file  Stress: Not on file  Social Connections: Not on file  Intimate Partner Violence: Not on file     Physical Exam   Vitals:   02/14/22 0110 02/14/22 0130  BP:  (!) 157/80  Pulse: 91   Resp: 19 20  Temp:    SpO2: 99%     CONSTITUTIONAL: Well-appearing, NAD NEURO/PSYCH:  Alert and oriented x 3, no focal deficits EYES:  eyes equal and reactive ENT/NECK:  no LAD, no JVD CARDIO: Regular rate, well-perfused, normal S1 and S2 PULM:  CTAB no wheezing or rhonchi GI/GU:  non-distended, non-tender MSK/SPINE:  No gross deformities, no edema SKIN:  no rash, atraumatic   *Additional and/or pertinent findings included in MDM below  Diagnostic and Interventional Summary    EKG Interpretation  Date/Time:  Saturday February 14 2022 01:11:02 EDT Ventricular Rate:  89 PR Interval:  203 QRS Duration: 87 QT Interval:  396 QTC Calculation: 482 R Axis:   84 Text Interpretation: Sinus rhythm Atrial premature complexes Borderline right axis deviation Borderline repolarization abnormality Borderline prolonged QT interval Confirmed by Gerlene Fee [id number]) on 02/14/2022 1:14:07 AM       Labs Reviewed  CBC - Abnormal; Notable for the following components:      Result Value   RBC 4.11 (*)     Hemoglobin 12.3 (*)    HCT 37.6 (*)    All other components within normal limits  COMPREHENSIVE METABOLIC PANEL - Abnormal; Notable for the following components:   Glucose, Bld 147 (*)    Creatinine, Ser 1.25 (*)    Total Bilirubin 1.4 (*)    GFR, Estimated 59 (*)    All other components within normal limits  URINALYSIS, ROUTINE W REFLEX MICROSCOPIC - Abnormal; Notable for the following components:   APPearance HAZY (*)    Glucose, UA 50 (*)    Hgb urine dipstick SMALL (*)    Ketones, ur 5 (*)    Protein, ur 30 (*)    Bacteria, UA RARE (*)    All other components within normal limits  LIPASE, BLOOD  TROPONIN I (HIGH SENSITIVITY)  TROPONIN I (HIGH SENSITIVITY)    CT ABDOMEN PELVIS W CONTRAST  Final Result    DG Chest Port 1 View  Final Result    US Abdomen Limited RUQ (LIVER/GB)    (Results Pending)    Medications  HYDROmorphone (DILAUDID) injection 1 mg (has no administration in time range)  lactated ringers infusion (has no administration in time range)  piperacillin-tazobactam (ZOSYN) IVPB 3.375 g (has no administration in time range)  sodium chloride 0.9 % bolus 500 mL (500 mLs Intravenous New Bag/Given 02/14/22 0132)  ondansetron (ZOFRAN) injection 4 mg (4 mg Intravenous Given 02/14/22 0129)  famotidine (PEPCID) IVPB 20 mg premix (20 mg Intravenous New Bag/Given 02/14/22 0129)  alum & mag hydroxide-simeth (MAALOX/MYLANTA) 200-200-20 MG/5ML suspension 30 mL (30 mLs Oral Given 02/14/22 0128)  fentaNYL (SUBLIMAZE) injection 50 mcg (50 mcg Intravenous Given 02/14/22 0129)  iohexol (OMNIPAQUE) 300 MG/ML solution 100 mL (100 mLs Intravenous Contrast Given 02/14/22 0209)     Procedures  /  Critical Care .Critical Care  Performed by: Maudie Flakes, MD Authorized by: Maudie Flakes, MD   Critical care provider statement:    Critical care time (minutes):  45   Critical care was necessary to treat or prevent imminent or life-threatening deterioration of the following conditions:  acute cholecystitis.   Critical care was time spent personally by me on the following activities:  Development of treatment plan with patient or surrogate, discussions with consultants, evaluation of patient's response to treatment, examination of patient, ordering and review of laboratory studies, ordering and review of radiographic studies, ordering and performing treatments and interventions, pulse oximetry, re-evaluation of patient's condition and review of old charts   ED Course and Medical Decision Making  Initial Impression and Ddx History of CAD and lung cancer, had a CABG and lung resection earlier this year, has undergone chemotherapy and is currently undergoing immunotherapy.  Differential diagnosis regarding patient's abdominal pain includes GERD, atypical presentation of ACS, pancreatitis, cholecystitis, biliary colic, gastric ulcer, perforated viscus, mesenteric ischemia, or complication related to metastatic spread of cancer.  Will need CT abdomen.  Past medical/surgical history that increases complexity of ED encounter: Lung cancer, CAD  Interpretation of Diagnostics I personally reviewed the EKG and my interpretation is as follows: Sinus rhythm, PACs, no significant change from prior  Labs overall reassuring with no significant blood count or electrolyte disturbance  CT abdomen revealing a likely cholecystitis.  Patient Reassessment and Ultimate Disposition/Management     Will discuss case with general surgery, anticipating admission.  Patient management required discussion with the following services or consulting groups:  General/Trauma Surgery  Complexity of Problems Addressed Acute illness or injury that poses threat of life of bodily function  Additional Data Reviewed and Analyzed Further history obtained from: Further history from spouse/family member  Additional Factors Impacting ED Encounter Risk Use of parenteral controlled substances and Consideration of  hospitalization  Barth Kirks. Sedonia Small, MD [place] [email or web address]  Final Clinical Impressions(s) / ED Diagnoses     ICD-10-CM   1. Abdominal pain, unspecified abdominal location  R10.9     2. Cholecystitis  K81.9       ED Discharge Orders     None        Discharge Instructions Discussed with and Provided to Patient:   Discharge Instructions   None      Maudie Flakes, MD 02/14/22 [phone]

## 2022-02-14 NOTE — ED Triage Notes (Signed)
Mid and epigastric - onset 1800 last night with N/V - denies diarrhea.  Pt has lung Cancer and getting infusions at this time.

## 2022-02-14 NOTE — Plan of Care (Signed)

## 2022-02-14 NOTE — Progress Notes (Signed)
Kevin [name] is a 81 y.o. male with medical history significant of lung cancer, CAD, hyperlipidemia who presents to the emergency department due to abdominal pain which started yesterday in the evening.  Imaging had demonstrated findings of cholelithiasis and likely acute cholecystitis and patient was started on IV fluids as well as Zosyn and kept n.p.o.  He has had ultrasound, again demonstrating acute cholecystitis.  General surgery following.  Patient seen and evaluated at bedside and discussion had with wife.  He has been admitted after midnight.  A.m. labs ordered.  Total care time: 25 minutes.

## 2022-02-15 ENCOUNTER — Inpatient Hospital Stay (HOSPITAL_COMMUNITY): Payer: Medicare HMO | Admitting: Anesthesiology

## 2022-02-15 ENCOUNTER — Encounter (HOSPITAL_COMMUNITY)
Admission: EM | Disposition: A | Discharge status: Home / Self Care | Payer: Self-pay | Source: Home / Self Care | Attending: Internal Medicine

## 2022-02-15 DIAGNOSIS — K8000 Calculus of gallbladder with acute cholecystitis without obstruction: Secondary | ICD-10-CM | POA: Diagnosis not present

## 2022-02-15 HISTORY — PX: CHOLECYSTECTOMY: SHX55

## 2022-02-15 LAB — COMPREHENSIVE METABOLIC PANEL
ALT: 10 U/L (ref 0–44)
AST: 12 U/L — ABNORMAL LOW (ref 15–41)
Albumin: 3.4 g/dL — ABNORMAL LOW (ref 3.5–5.0)
Alkaline Phosphatase: 57 U/L (ref 38–126)
Anion gap: 8 (ref 5–15)
BUN: 17 mg/dL (ref 8–23)
CO2: 24 mmol/L (ref 22–32)
Calcium: 8.7 mg/dL — ABNORMAL LOW (ref 8.9–10.3)
Chloride: 102 mmol/L (ref 98–111)
Creatinine, Ser: 1.4 mg/dL — ABNORMAL HIGH (ref 0.61–1.24)
GFR, Estimated: 51 mL/min — ABNORMAL LOW (ref >60)
Glucose, Bld: 87 mg/dL (ref 70–99)
Potassium: 3.8 mmol/L (ref 3.5–5.1)
Sodium: 134 mmol/L — ABNORMAL LOW (ref 135–145)
Total Bilirubin: 1.8 mg/dL — ABNORMAL HIGH (ref 0.3–1.2)
Total Protein: 6.2 g/dL — ABNORMAL LOW (ref 6.5–8.1)

## 2022-02-15 LAB — CBC
HCT: 33.8 % — ABNORMAL LOW (ref 39.0–52.0)
Hemoglobin: 11.3 g/dL — ABNORMAL LOW (ref 13.0–17.0)
MCH: 30.8 pg (ref 26.0–34.0)
MCHC: 33.4 g/dL (ref 30.0–36.0)
MCV: 92.1 fL (ref 80.0–100.0)
Platelets: 162 10*3/uL (ref 150–400)
RBC: 3.67 MIL/uL — ABNORMAL LOW (ref 4.22–5.81)
RDW: 13.7 % (ref 11.5–15.5)
WBC: 6.1 10*3/uL (ref 4.0–10.5)
nRBC: 0 % (ref 0.0–0.2)

## 2022-02-15 LAB — MAGNESIUM: Magnesium: 1.9 mg/dL (ref 1.7–2.4)

## 2022-02-15 SURGERY — LAPAROSCOPIC CHOLECYSTECTOMY
Anesthesia: General

## 2022-02-15 MED ORDER — METOPROLOL TARTRATE 5 MG/5ML IV SOLN
INTRAVENOUS | Status: AC
  Filled 2022-02-15: qty 5

## 2022-02-15 MED ORDER — SODIUM CHLORIDE BACTERIOSTATIC 0.9 % IJ SOLN
INTRAMUSCULAR | Status: AC
  Filled 2022-02-15: qty 10

## 2022-02-15 MED ORDER — HYDROMORPHONE HCL 1 MG/ML IJ SOLN: 0.2500 mg | INTRAMUSCULAR | Status: DC | PRN

## 2022-02-15 MED ORDER — BUPIVACAINE LIPOSOME 1.3 % IJ SUSP
INTRAMUSCULAR | Status: DC | PRN
  Administered 2022-02-15: 20 mL

## 2022-02-15 MED ORDER — ROCURONIUM BROMIDE 10 MG/ML (PF) SYRINGE
PREFILLED_SYRINGE | INTRAVENOUS | Status: AC
  Filled 2022-02-15: qty 10

## 2022-02-15 MED ORDER — LIDOCAINE HCL (CARDIAC) PF 100 MG/5ML IV SOSY
PREFILLED_SYRINGE | INTRAVENOUS | Status: DC | PRN
  Administered 2022-02-15: 100 mg via INTRAVENOUS

## 2022-02-15 MED ORDER — METOCLOPRAMIDE HCL 5 MG/ML IJ SOLN
INTRAMUSCULAR | Status: DC | PRN
  Administered 2022-02-15: 10 mg via INTRAVENOUS

## 2022-02-15 MED ORDER — SODIUM CHLORIDE 0.9 % IR SOLN
Status: DC | PRN
  Administered 2022-02-15: 1000 mL

## 2022-02-15 MED ORDER — PROPOFOL 10 MG/ML IV BOLUS
INTRAVENOUS | Status: AC
  Filled 2022-02-15: qty 20

## 2022-02-15 MED ORDER — FENTANYL CITRATE (PF) 250 MCG/5ML IJ SOLN
INTRAMUSCULAR | Status: AC
  Filled 2022-02-15: qty 5

## 2022-02-15 MED ORDER — ONDANSETRON HCL 4 MG/2ML IJ SOLN
INTRAMUSCULAR | Status: AC
  Filled 2022-02-15: qty 2

## 2022-02-15 MED ORDER — PHENYLEPHRINE 80 MCG/ML (10ML) SYRINGE FOR IV PUSH (FOR BLOOD PRESSURE SUPPORT)
PREFILLED_SYRINGE | INTRAVENOUS | Status: DC | PRN
  Administered 2022-02-15 (×3): 160 ug via INTRAVENOUS

## 2022-02-15 MED ORDER — METOPROLOL TARTRATE 5 MG/5ML IV SOLN
INTRAVENOUS | Status: DC | PRN
  Administered 2022-02-15: 2 mg via INTRAVENOUS
  Administered 2022-02-15: 1 mg via INTRAVENOUS
  Administered 2022-02-15: 2 mg via INTRAVENOUS

## 2022-02-15 MED ORDER — DEXAMETHASONE SODIUM PHOSPHATE 10 MG/ML IJ SOLN
INTRAMUSCULAR | Status: DC | PRN
  Administered 2022-02-15: 10 mg via INTRAVENOUS

## 2022-02-15 MED ORDER — ROCURONIUM BROMIDE 10 MG/ML (PF) SYRINGE
PREFILLED_SYRINGE | INTRAVENOUS | Status: DC | PRN
  Administered 2022-02-15: 60 mg via INTRAVENOUS

## 2022-02-15 MED ORDER — PROPOFOL 10 MG/ML IV BOLUS
INTRAVENOUS | Status: DC | PRN
  Administered 2022-02-15: 100 mg via INTRAVENOUS

## 2022-02-15 MED ORDER — ONDANSETRON HCL 4 MG/2ML IJ SOLN: 4.0000 mg | Freq: Once | INTRAMUSCULAR | Status: DC | PRN

## 2022-02-15 MED ORDER — SUGAMMADEX SODIUM 200 MG/2ML IV SOLN
INTRAVENOUS | Status: DC | PRN
  Administered 2022-02-15: 400 mg via INTRAVENOUS

## 2022-02-15 MED ORDER — METOCLOPRAMIDE HCL 5 MG/ML IJ SOLN
INTRAMUSCULAR | Status: AC
  Filled 2022-02-15: qty 2

## 2022-02-15 MED ORDER — PHENYLEPHRINE 80 MCG/ML (10ML) SYRINGE FOR IV PUSH (FOR BLOOD PRESSURE SUPPORT)
PREFILLED_SYRINGE | INTRAVENOUS | Status: AC
  Filled 2022-02-15: qty 10

## 2022-02-15 MED ORDER — HEMOSTATIC AGENTS (NO CHARGE) OPTIME
TOPICAL | Status: DC | PRN
  Administered 2022-02-15: 1 via TOPICAL

## 2022-02-15 MED ORDER — ESMOLOL HCL 100 MG/10ML IV SOLN
INTRAVENOUS | Status: AC
  Filled 2022-02-15: qty 10

## 2022-02-15 MED ORDER — ESMOLOL HCL 100 MG/10ML IV SOLN
INTRAVENOUS | Status: DC | PRN
  Administered 2022-02-15: 20 mg via INTRAVENOUS

## 2022-02-15 MED ORDER — SUCCINYLCHOLINE CHLORIDE 200 MG/10ML IV SOSY
PREFILLED_SYRINGE | INTRAVENOUS | Status: AC
  Filled 2022-02-15: qty 10

## 2022-02-15 MED ORDER — FENTANYL CITRATE (PF) 100 MCG/2ML IJ SOLN
INTRAMUSCULAR | Status: DC | PRN
  Administered 2022-02-15: 50 ug via INTRAVENOUS
  Administered 2022-02-15: 100 ug via INTRAVENOUS

## 2022-02-15 MED ORDER — ONDANSETRON HCL 4 MG/2ML IJ SOLN
INTRAMUSCULAR | Status: DC | PRN
  Administered 2022-02-15: 4 mg via INTRAVENOUS

## 2022-02-15 MED ORDER — BUPIVACAINE LIPOSOME 1.3 % IJ SUSP
INTRAMUSCULAR | Status: AC
  Filled 2022-02-15: qty 20

## 2022-02-15 MED ORDER — LACTATED RINGERS IV SOLN: INTRAVENOUS | Status: DC | PRN

## 2022-02-15 MED ORDER — LIDOCAINE HCL (PF) 2 % IJ SOLN
INTRAMUSCULAR | Status: AC
  Filled 2022-02-15: qty 5

## 2022-02-15 MED ORDER — DEXAMETHASONE SODIUM PHOSPHATE 10 MG/ML IJ SOLN
INTRAMUSCULAR | Status: AC
  Filled 2022-02-15: qty 1

## 2022-02-15 MED ORDER — OXYCODONE HCL 5 MG PO TABS
5.0000 mg | ORAL_TABLET | ORAL | Status: DC | PRN
  Administered 2022-02-15 – 2022-02-16 (×2): 5 mg via ORAL
  Filled 2022-02-15 (×2): qty 1

## 2022-02-15 MED ORDER — MORPHINE SULFATE (PF) 2 MG/ML IV SOLN: 2.0000 mg | INTRAVENOUS | Status: DC | PRN

## 2022-02-15 SURGICAL SUPPLY — 42 items
ADH SKN CLS APL DERMABOND .7 (GAUZE/BANDAGES/DRESSINGS) ×1
APL PRP STRL LF DISP 70% ISPRP (MISCELLANEOUS) ×1
APPLIER CLIP ROT 10 11.4 M/L (STAPLE) ×1
APR CLP MED LRG 11.4X10 (STAPLE) ×1
BLADE SURG 15 STRL LF DISP TIS (BLADE) ×1 IMPLANT
BLADE SURG 15 STRL SS (BLADE) ×1
CHLORAPREP W/TINT 26 (MISCELLANEOUS) ×1 IMPLANT
CLIP APPLIE ROT 10 11.4 M/L (STAPLE) ×1 IMPLANT
CLOTH BEACON ORANGE TIMEOUT ST (SAFETY) ×1 IMPLANT
COVER LIGHT HANDLE STERIS (MISCELLANEOUS) ×2 IMPLANT
DECANTER SPIKE VIAL GLASS SM (MISCELLANEOUS) ×1 IMPLANT
DERMABOND ADVANCED .7 DNX12 (GAUZE/BANDAGES/DRESSINGS) ×1 IMPLANT
ELECT REM PT RETURN 9FT ADLT (ELECTROSURGICAL) ×1
ELECTRODE REM PT RTRN 9FT ADLT (ELECTROSURGICAL) ×1 IMPLANT
GAUZE 4X4 16PLY ~~LOC~~+RFID DBL (SPONGE) IMPLANT
GLOVE BIO SURGEON STRL SZ 6.5 (GLOVE) ×1 IMPLANT
GLOVE BIOGEL M 7.0 STRL (GLOVE) IMPLANT
GLOVE BIOGEL M STRL SZ7.5 (GLOVE) IMPLANT
GLOVE BIOGEL PI IND STRL 6.5 (GLOVE) ×1 IMPLANT
GLOVE BIOGEL PI IND STRL 7.0 (GLOVE) ×2 IMPLANT
GOWN STRL REUS W/TWL LRG LVL3 (GOWN DISPOSABLE) ×3 IMPLANT
HEMOSTAT SNOW SURGICEL 2X4 (HEMOSTASIS) ×1 IMPLANT
INST SET LAPROSCOPIC AP (KITS) ×1 IMPLANT
KIT TURNOVER KIT A (KITS) ×1 IMPLANT
MANIFOLD NEPTUNE II (INSTRUMENTS) ×1 IMPLANT
NDL INSUFFLATION 14GA 120MM (NEEDLE) ×1 IMPLANT
NEEDLE INSUFFLATION 14GA 120MM (NEEDLE) ×1 IMPLANT
NS IRRIG 1000ML POUR BTL (IV SOLUTION) ×1 IMPLANT
PACK LAP CHOLE LZT030E (CUSTOM PROCEDURE TRAY) ×1 IMPLANT
PAD ARMBOARD 7.5X6 YLW CONV (MISCELLANEOUS) ×1 IMPLANT
SET BASIN LINEN APH (SET/KITS/TRAYS/PACK) ×1 IMPLANT
SET TUBE SMOKE EVAC HIGH FLOW (TUBING) ×1 IMPLANT
SLEEVE Z-THREAD 5X100MM (TROCAR) ×1 IMPLANT
SUT MNCRL AB 4-0 PS2 18 (SUTURE) ×2 IMPLANT
SUT VICRYL 0 UR6 27IN ABS (SUTURE) ×1 IMPLANT
SYS BAG RETRIEVAL 10MM (BASKET) ×1
SYSTEM BAG RETRIEVAL 10MM (BASKET) ×1 IMPLANT
TROCAR Z-THRD FIOS HNDL 11X100 (TROCAR) ×1 IMPLANT
TROCAR Z-THREAD FIOS 5X100MM (TROCAR) ×1 IMPLANT
TROCAR Z-THREAD SLEEVE 11X100 (TROCAR) ×1 IMPLANT
TUBE CONNECTING 12X1/4 (SUCTIONS) ×1 IMPLANT
WARMER LAPAROSCOPE (MISCELLANEOUS) ×1 IMPLANT

## 2022-02-15 NOTE — Anesthesia Postprocedure Evaluation (Signed)
Anesthesia Post Note  Patient: Kevin [name]  Procedure(s) Performed: LAPAROSCOPIC CHOLECYSTECTOMY  Patient location during evaluation: PACU Anesthesia Type: General Level of consciousness: awake and alert and oriented Pain management: pain level controlled Vital Signs Assessment: post-procedure vital signs reviewed and stable Respiratory status: spontaneous breathing, nonlabored ventilation, respiratory function stable and patient connected to nasal cannula oxygen Cardiovascular status: blood pressure returned to baseline and stable Postop Assessment: no apparent nausea or vomiting Anesthetic complications: no   No notable events documented.   Last Vitals:  Vitals:   02/15/22 1515 02/15/22 1527  BP: 130/80   Pulse: 87 81  Resp: (!) 22 13  Temp: 36.7 C   SpO2: 98% 98%    Last Pain:  Vitals:   02/15/22 1513  TempSrc:   PainSc: 0-No pain                 Lendy Dittrich C Amadu Schlageter

## 2022-02-15 NOTE — Anesthesia Preprocedure Evaluation (Addendum)
Anesthesia Evaluation  Patient identified by MRN, date of birth, ID band Patient awake    Reviewed: Allergy & Precautions, H&P , NPO status , Patient's Chart, lab work & pertinent test results, reviewed documented beta blocker date and time   Airway Mallampati: III  TM Distance: >3 FB Neck ROM: Full    Dental  (+) Dental Advisory Given, Missing   Pulmonary former smoker Right lung cancer   Pulmonary exam normal breath sounds clear to auscultation       Cardiovascular hypertension, Pt. on home beta blockers + CAD and + CABG  Normal cardiovascular exam Rhythm:Regular Rate:Normal  1. Left ventricular ejection fraction, by estimation, is 40 to 45%. The  left ventricle has mildly decreased function. The left ventricle  demonstrates global hypokinesis. Left ventricular diastolic parameters  were normal.   2. Right ventricular systolic function is normal. The right ventricular  size is normal.   3. The mitral valve is normal in structure. Trivial mitral valve  regurgitation. No evidence of mitral stenosis.   4. The aortic valve is normal in structure. Aortic valve regurgitation is  not visualized. No aortic stenosis is present.     Neuro/Psych negative neurological ROS  negative psych ROS   GI/Hepatic negative GI ROS, Neg liver ROS,,,  Endo/Other  negative endocrine ROS    Renal/GU negative Renal ROS  negative genitourinary   Musculoskeletal  (+) Arthritis , Osteoarthritis,    Abdominal   Peds negative pediatric ROS (+)  Hematology negative hematology ROS (+)   Anesthesia Other Findings   Reproductive/Obstetrics negative OB ROS                              Anesthesia Physical Anesthesia Plan  ASA: 3  Anesthesia Plan: General   Post-op Pain Management: Minimal or no pain anticipated and Dilaudid IV   Induction: Intravenous  PONV Risk Score and Plan: 3 and Ondansetron and  Dexamethasone  Airway Management Planned: Oral ETT and Video Laryngoscope Planned  Additional Equipment:   Intra-op Plan:   Post-operative Plan: Extubation in OR  Informed Consent: I have reviewed the patients History and Physical, chart, labs and discussed the procedure including the risks, benefits and alternatives for the proposed anesthesia with the patient or authorized representative who has indicated his/her understanding and acceptance.     Dental advisory given  Plan Discussed with: CRNA and Surgeon  Anesthesia Plan Comments:          Anesthesia Quick Evaluation

## 2022-02-15 NOTE — Discharge Instructions (Signed)
Discharge Laparoscopic Surgery Instructions:  Common Complaints: Right shoulder pain is common after laparoscopic surgery. This is secondary to the gas used in the surgery being trapped under the diaphragm.  Walk to help your body absorb the gas. This will improve in a few days. Pain at the port sites are common, especially the larger port sites. This will improve with time.  Some nausea is common and poor appetite. The main goal is to stay hydrated the first few days after surgery.   Diet/ Activity: Diet as tolerated. You may not have an appetite, but it is important to stay hydrated. Drink 64 ounces of water a day. Your appetite will return with time.  Shower per your regular routine daily.  Do not take hot showers. Take warm showers that are less than 10 minutes. Rest and listen to your body, but do not remain in bed all day.  Walk everyday for at least 15-20 minutes. Deep cough and move around every 1-2 hours in the first few days after surgery.  Do not lift > 10 lbs, perform excessive bending, pushing, pulling, squatting for 1-2 weeks after surgery.  Do not pick at the dermabond glue on your incision sites.  This glue film will remain in place for 1-2 weeks and will start to peel off.  Do not place lotions or balms on your incision unless instructed to specifically by Dr. Krizia Flight.   Pain Expectations and Narcotics: -After surgery you will have pain associated with your incisions and this is normal. The pain is muscular and nerve pain, and will get better with time. -You are encouraged and expected to take non narcotic medications like tylenol and ibuprofen (when able) to treat pain as multiple modalities can aid with pain treatment. -Narcotics are only used when pain is severe or there is breakthrough pain. -You are not expected to have a pain score of 0 after surgery, as we cannot prevent pain. A pain score of 3-4 that allows you to be functional, move, walk, and tolerate some activity is  the goal. The pain will continue to improve over the days after surgery and is dependent on your surgery. -Due to [place] law, we are only able to give a certain amount of pain medication to treat post operative pain, and we only give additional narcotics on a patient by patient basis.  -For most laparoscopic surgery, studies have shown that the majority of patients only need 10-15 narcotic pills, and for open surgeries most patients only need 15-20.   -Having appropriate expectations of pain and knowledge of pain management with non narcotics is important as we do not want anyone to become addicted to narcotic pain medication.  -Using ice packs in the first 48 hours and heating pads after 48 hours, wearing an abdominal binder (when recommended), and using over the counter medications are all ways to help with pain management.   -Simple acts like meditation and mindfulness practices after surgery can also help with pain control and research has proven the benefit of these practices.  Medication: Take tylenol and ibuprofen as needed for pain control, alternating every 4-6 hours.  Example:  Tylenol 1000mg @ 6am, 12noon, 6pm, 12midnight (Do not exceed 4000mg of tylenol a day). Ibuprofen 800mg @ 9am, 3pm, 9pm, 3am (Do not exceed 3600mg of ibuprofen a day).  Take Roxicodone for breakthrough pain every 4 hours.  Take Colace for constipation related to narcotic pain medication. If you do not have a bowel movement in 2 days, take Miralax   over the counter.  Drink plenty of water to also prevent constipation.   Contact Information: If you have questions or concerns, please call our office, [phone], Monday- Thursday 8AM-5PM and Friday 8AM-12Noon.  If it is after hours or on the weekend, please call [facility] Main Number, [phone], [phone], and ask to speak to the surgeon on call for Dr. Davelyn Gwinn at [place].   

## 2022-02-15 NOTE — Progress Notes (Signed)
[facility]  Updated wife. Will stay overnight and home tomorrow. Diet and PRN for pain.  Will do post op phone call 11/22.  Curlene Labrum, MD [facility] [address] [place], [place] [address] [phone] (office)

## 2022-02-15 NOTE — Addendum Note (Signed)
Addendum  created 02/15/22 1535 by Denese Killings, MD   Flowsheet accepted, Intraprocedure Meds edited

## 2022-02-15 NOTE — Transfer of Care (Signed)
Immediate Anesthesia Transfer of Care Note  Patient: Kevin [name]  Procedure(s) Performed: LAPAROSCOPIC CHOLECYSTECTOMY  Patient Location: PACU  Anesthesia Type:General  Level of Consciousness: awake, alert , oriented, and sedated  Airway & Oxygen Therapy: Patient Spontanous Breathing and Patient connected to nasal cannula oxygen  Post-op Assessment: Report given to RN and Post -op Vital signs reviewed and stable  Post vital signs: Reviewed and stable  Last Vitals:  Vitals Value Taken Time  BP 130/80 02/15/22 1515  Temp 36.7 C 02/15/22 1515  Pulse 81 02/15/22 1527  Resp 13 02/15/22 1527  SpO2 98 % 02/15/22 1527    Last Pain:  Vitals:   02/15/22 1513  TempSrc:   PainSc: 0-No pain         Complications: No notable events documented.

## 2022-02-15 NOTE — Op Note (Signed)
Operative Note   Preoperative Diagnosis: Acute cholecystitis    Postoperative Diagnosis: Same   Procedure(s) Performed: Laparoscopic cholecystectomy   Surgeon: Ria Comment C. Constance Haw, MD   Assistants: No qualified resident was available   Anesthesia: General endotracheal   Anesthesiologist: Denese Killings, MD    Specimens: Gallbladder    Estimated Blood Loss: Minimal    Blood Replacement: None    Complications: None    Operative Findings: Edematous gallbladder   Procedure: The patient was taken to the operating room and placed supine. General endotracheal anesthesia was induced. Intravenous antibiotics were administered per protocol. An orogastric tube positioned to decompress the stomach. The abdomen was prepared and draped in the usual sterile fashion.    A supraumbilical incision was made and a Veress technique was utilized to achieve pneumoperitoneum to 15 mmHg with carbon dioxide. A 11 mm optiview port was placed through the supraumbilical region, and a 10 mm 0-degree operative laparoscope was introduced. The area underlying the trocar and Veress needle were inspected and without evidence of injury.  Remaining trocars were placed under direct vision. Two 5 mm ports were placed in the right abdomen, between the anterior axillary and midclavicular line.  A final 11 mm port was placed through the mid-epigastrium, near the falciform ligament.    The gallbladder fundus was elevated cephalad and the infundibulum was retracted to the patient's right. The gallbladder/cystic duct junction was skeletonized. The cystic artery noted in the triangle of Calot and was also skeletonized.  We then continued liberal medial and lateral dissection until the critical view of safety was achieved.    The cystic duct and cystic artery were doubly clipped and divided. The gallbladder was then dissected from the liver bed with electrocautery. The specimen was placed in an Endopouch and was retrieved  through the epigastric site.   Final inspection revealed acceptable hemostasis. Surgical SNOW was placed in the gallbladder bed.  Trocars were removed and pneumoperitoneum was released.  0 Vicryl fascial sutures were used to close the epigastric and umbilical port sites. Skin incisions were closed with 4-0 Monocryl subcuticular sutures and Dermabond. The patient was awakened from anesthesia and extubated without complication.    Curlene Labrum, MD [facility] [address] [place], [place] [address] [phone] (office)

## 2022-02-15 NOTE — Anesthesia Procedure Notes (Signed)
Procedure Name: Intubation Date/Time: 02/15/2022 3:19 PM  Performed by: Denese Killings, MDPre-anesthesia Checklist: Patient identified, Emergency Drugs available, Suction available and Patient being monitored Patient Re-evaluated:Patient Re-evaluated prior to induction Oxygen Delivery Method: Circle system utilized Preoxygenation: Pre-oxygenation with 100% oxygen Induction Type: IV induction Ventilation: Mask ventilation without difficulty Laryngoscope Size: Mac and 3 Grade View: Grade III Tube type: Oral Tube size: 7.5 mm Number of attempts: 1 Airway Equipment and Method: Stylet Placement Confirmation: ETT inserted through vocal cords under direct vision, positive ETCO2 and breath sounds checked- equal and bilateral Secured at: 24 cm Tube secured with: Tape Dental Injury: Teeth and Oropharynx as per pre-operative assessment

## 2022-02-15 NOTE — Progress Notes (Signed)
PROGRESS NOTE    [name]  JKD:[id number] DOB: [birth year] DOA: 02/14/2022 PCP: [name], MD   Brief Narrative:    Kevin [name] is a 81 y.o. male with medical history significant of lung cancer, CAD, hyperlipidemia who presents to the emergency department due to abdominal pain which started yesterday in the evening.  Imaging had demonstrated findings of cholelithiasis and likely acute cholecystitis and patient was started on IV fluids as well as Zosyn and kept n.p.o.  He has had ultrasound, again demonstrating acute cholecystitis.  He is being planned for laparoscopic cholecystectomy.  Assessment & Plan:   Principal Problem:   Cholelithiasis with acute cholecystitis Active Problems:   Coronary artery disease involving native coronary artery of native heart without angina pectoris   Adenocarcinoma, lung (HCC)   Abdominal pain   Nausea & vomiting   Mixed hyperlipidemia  Assessment and Plan:   Cholelithiasis with acute cholecystitis Abdominal pain, nausea and vomiting in the setting of above Patient will be admitted to [facility] Continue IV LR Continue IV antibiotics as ordered Continue IV morphine 2 mg q.4h p.r.n. for moderate to severe pain Continue IV Compazine p.r.n. Currently n.p.o. in anticipation of laparoscopic cholecystectomy today   History of lung cancer Patient has adenocarcinoma of right lung stage III Patient is on durvalumab and filgrastim He follows with Dr. [name]   CAD Mixed hyperlipidemia Patient is currently n.p.o. Consider starting patient's home meds when he resumes oral intake    DVT prophylaxis: SCDs Code Status: Full Family Communication: Wife at bedside 11/5 Disposition Plan:  Status is: Inpatient Remains inpatient appropriate because: Continued need for IV fluid and medications.  Consultants:  General surgery  Procedures:  None  Antimicrobials:  Anti-infectives (From admission, onward)    Start     Dose/Rate Route  Frequency Ordered Stop   02/15/22 0600  cefoTEtan (CEFOTAN) 2 g in sodium chloride 0.9 % 100 mL IVPB        2 g 200 mL/hr over 30 Minutes Intravenous On call to O.R. 02/14/22 1943 02/16/22 0559   02/14/22 1000  piperacillin-tazobactam (ZOSYN) IVPB 3.375 g        3.375 g 12.5 mL/hr over 240 Minutes Intravenous Every 8 hours 02/14/22 0518     02/14/22 0300  piperacillin-tazobactam (ZOSYN) IVPB 3.375 g        3.375 g 100 mL/hr over 30 Minutes Intravenous  Once 02/14/22 0250 02/14/22 0327      Subjective: Patient seen and evaluated today with no new acute complaints or concerns. No acute concerns or events noted overnight.  He is currently n.p.o. in anticipation for laparoscopic cholecystectomy.  Objective: Vitals:   02/14/22 1208 02/14/22 1210 02/14/22 2116 02/15/22 0456  BP: (!) 82/53 123/71 106/65 (!) 103/58  Pulse: (!) 102 99 83 (!) 59  Resp: 20 20 20 20   Temp: 100.2 F (37.9 C) 99.5 F (37.5 C) 98.6 F (37 C) 98.1 F (36.7 C)  TempSrc: Oral Oral Oral Oral  SpO2: 96% 97% 97% 95%  Weight:      Height:        Intake/Output Summary (Last 24 hours) at 02/15/2022 1116 Last data filed at 02/15/2022 0423 Gross per 24 hour  Intake 785.07 ml  Output --  Net 785.07 ml   Filed Weights   02/14/22 0113  Weight: 77.7 kg    Examination:  General exam: Appears calm and comfortable  Respiratory system: Clear to auscultation. Respiratory effort normal. Cardiovascular system: S1 & S2 heard, RRR.  Gastrointestinal system: Abdomen is soft Central nervous system: Alert and awake Extremities: No edema Skin: No significant lesions noted Psychiatry: Flat affect.    Data Reviewed: I have personally reviewed following labs and imaging studies  CBC: Recent Labs  Lab 02/14/22 0120 02/15/22 0350  WBC 8.3 6.1  HGB 12.3* 11.3*  HCT 37.6* 33.8*  MCV 91.5 92.1  PLT 181 518   Basic Metabolic Panel: Recent Labs  Lab 02/14/22 0120 02/14/22 0544 02/15/22 0350  NA 135  --  134*   K 4.1  --  3.8  CL 103  --  102  CO2 25  --  24  GLUCOSE 147*  --  87  BUN 18  --  17  CREATININE 1.25*  --  1.40*  CALCIUM 9.3  --  8.7*  MG  --  1.8 1.9  PHOS  --  3.5  --    GFR: Estimated Creatinine Clearance: 44.2 mL/min (A) (by C-G formula based on SCr of 1.4 mg/dL (H)). Liver Function Tests: Recent Labs  Lab 02/14/22 0120 02/15/22 0350  AST 17 12*  ALT 15 10  ALKPHOS 90 57  BILITOT 1.4* 1.8*  PROT 7.7 6.2*  ALBUMIN 4.2 3.4*   Recent Labs  Lab 02/14/22 0120  LIPASE 30   No results for input(s): "AMMONIA" in the last 168 hours. Coagulation Profile: No results for input(s): "INR", "PROTIME" in the last 168 hours. Cardiac Enzymes: No results for input(s): "CKTOTAL", "CKMB", "CKMBINDEX", "TROPONINI" in the last 168 hours. BNP (last 3 results) No results for input(s): "PROBNP" in the last 8760 hours. HbA1C: No results for input(s): "HGBA1C" in the last 72 hours. CBG: No results for input(s): "GLUCAP" in the last 168 hours. Lipid Profile: No results for input(s): "CHOL", "HDL", "LDLCALC", "TRIG", "CHOLHDL", "LDLDIRECT" in the last 72 hours. Thyroid Function Tests: No results for input(s): "TSH", "T4TOTAL", "FREET4", "T3FREE", "THYROIDAB" in the last 72 hours. Anemia Panel: No results for input(s): "VITAMINB12", "FOLATE", "FERRITIN", "TIBC", "IRON", "RETICCTPCT" in the last 72 hours. Sepsis Labs: No results for input(s): "PROCALCITON", "LATICACIDVEN" in the last 168 hours.  Recent Results (from the past 240 hour(s))  Culture, blood (Routine X 2) w Reflex to ID Panel     Status: None (Preliminary result)   Collection Time: 02/14/22  5:44 AM   Specimen: Left Antecubital; Blood  Result Value Ref Range Status   Specimen Description   Final    LEFT ANTECUBITAL BOTTLES DRAWN AEROBIC AND ANAEROBIC   Special Requests   Final    Blood Culture results may not be optimal due to an excessive volume of blood received in culture bottles   Culture   Final    NO GROWTH <12  HOURS Performed at [facility], [address] [place], [place] [address]    Report Status PENDING  Incomplete  Culture, blood (Routine X 2) w Reflex to ID Panel     Status: None (Preliminary result)   Collection Time: 02/14/22  5:53 AM   Specimen: BLOOD LEFT HAND  Result Value Ref Range Status   Specimen Description   Final    BLOOD LEFT HAND BOTTLES DRAWN AEROBIC AND ANAEROBIC   Special Requests   Final    Blood Culture results may not be optimal due to an excessive volume of blood received in culture bottles   Culture   Final    NO GROWTH <12 HOURS Performed at [facility], [address], [place], [place] [address]    Report Status PENDING  Incomplete         Radiology Studies: US Abdomen Limited RUQ (LIVER/GB)  Result Date: 02/14/2022 CLINICAL DATA:  Epigastric pain with nausea and vomiting. Cholelithiasis. EXAM: ULTRASOUND ABDOMEN LIMITED RIGHT UPPER QUADRANT COMPARISON:  None Available. FINDINGS: Gallbladder: Gallbladder is distended and contains echogenic sludge. Gallstone in the gallbladder neck is better visualized on CT also performed today. Diffuse gallbladder wall thickening is seen measuring up to 6 mm. These findings are consistent with acute cholecystitis. Common bile duct: Diameter: 5 mm, within normal limits. Liver: No focal lesion identified. Within normal limits in parenchymal echogenicity. Portal vein is patent on color Doppler imaging with normal direction of blood flow towards the liver. Other: None. IMPRESSION: Distended gallbladder with small gallstone in the neck and diffuse gallbladder wall thickening, consistent with acute cholecystitis. No evidence of biliary ductal dilatation. Electronically Signed   By: Marlaine Hind M.D.   On: 02/14/2022 11:27   CT ABDOMEN PELVIS W CONTRAST  Result Date: 02/14/2022 CLINICAL DATA:  Mid and epigastric pain with nausea and vomiting EXAM: CT ABDOMEN AND PELVIS WITH CONTRAST TECHNIQUE: Multidetector CT imaging of the abdomen  and pelvis was performed using the standard protocol following bolus administration of intravenous contrast. RADIATION DOSE REDUCTION: This exam was performed according to the departmental dose-optimization program which includes automated exposure control, adjustment of the mA and/or kV according to patient size and/or use of iterative reconstruction technique. CONTRAST:  114mL OMNIPAQUE IOHEXOL 300 MG/ML  SOLN COMPARISON:  PET/CT 07/07/2021 FINDINGS: Lower chest: Emphysema. Motion obscures the lung bases. Scattered areas of scarring. No acute abnormality. Hepatobiliary: 7 mm gallstone within the cystic duct with mild adjacent stranding. Mild pericholecystic stranding. No intra hepatic biliary dilation. The common bile duct is normal in caliber. No suspicious hepatic lesion. Pancreas: Unremarkable. Spleen: Unremarkable. Adrenals/Urinary Tract: Adrenal glands are unremarkable. Bilateral cortical renal scarring. Nonobstructing right nephrolithiasis. No ureteral stones or hydronephrosis. Unremarkable bladder. Stomach/Bowel: Unremarkable stomach. Normal caliber large and small bowel. Normal appendix. Colonic diverticulosis without diverticulitis. Vascular/Lymphatic: Aortic atherosclerosis. No enlarged abdominal or pelvic lymph nodes. Reproductive: Clips in the prostate gland. Coarse calcification in the prostate gland or bladder wall. Other: No free intraperitoneal fluid or air. Musculoskeletal: No acute or significant osseous findings. L4 vertebroplasty and height loss. IMPRESSION: Findings suggestive of acute cholecystitis secondary to 7 mm stone in the cystic duct. Aortic Atherosclerosis (ICD10-I70.0) and Emphysema (ICD10-J43.9). Electronically Signed   By: Placido Sou M.D.   On: 02/14/2022 02:33   DG Chest Port 1 View  Result Date: 02/14/2022 CLINICAL DATA:  Epigastric pain EXAM: PORTABLE CHEST 1 VIEW COMPARISON:  CT chest dated 01/15/2022 FINDINGS: Subpleural scarring in the lateral right upper lobe.  Postsurgical changes related to right upper lobe wedge resection. Associated nodularity on recent prior CT is not radiographically evident. Left lung is clear. The heart is top-normal in size. Postsurgical changes related to prior CABG. Median sternotomy. IMPRESSION: Postsurgical changes related to right upper lobe wedge resection. Associated nodularity on recent prior CT is not radiographically evident. Adjacent subpleural scarring. No evidence of acute cardiopulmonary disease. Electronically Signed   By: Julian Hy M.D.   On: 02/14/2022 01:38        Scheduled Meds: Continuous Infusions:  cefoTEtan (CEFOTAN) IV     lactated ringers Stopped (02/14/22 0950)   piperacillin-tazobactam (ZOSYN)  IV 3.375 g (02/15/22 0858)     LOS: 1 day    Time spent: 35 minutes    Pete Schnitzer Darleen Crocker, DO [facility]  If 7PM-7AM, please  contact night-coverage [email or web address] 02/15/2022, 11:16 AM

## 2022-02-15 NOTE — Progress Notes (Signed)
[facility]  Hoping to still do surgery today. OR delayed by emergency.  Patient without any questions.  Curlene Labrum, MD [facility] [address] [place], [place] [address] [phone] (office)

## 2022-02-15 NOTE — TOC Initial Note (Signed)
Transition of Care [facility]) - Initial/Assessment Note    Patient Details  Name: Kevin [name] MRN: [id number] Date of Birth: 1941/01/29  Transition of Care [facility]) CM/SW Contact:    Iona Beard, [place] Phone Number: 02/15/2022, 1:48 PM  Clinical Narrative:                 Pt is high risk for admission. CSW spoke with pt and family in room to complete assessment. Pt lives with his wife. Pt is independent in completing ADLs and able to drive when needed. Pt has not had HH. Pt does not use any DME. TOC to follow.   Expected Discharge Plan: Home/Self Care Barriers to Discharge: Continued Medical Work up   Patient Goals and CMS Choice Patient states their goals for this hospitalization and ongoing recovery are:: return home CMS Medicare.gov Compare Post Acute Care list provided to:: Patient Choice offered to / list presented to : Patient, Spouse  Expected Discharge Plan and Services Expected Discharge Plan: Home/Self Care In-house Referral: Clinical Social Work Discharge Planning Services: CM Consult   Living arrangements for the past 2 months: Single Family Home                                      Prior Living Arrangements/Services Living arrangements for the past 2 months: Single Family Home Lives with:: Spouse Patient language and need for interpreter reviewed:: Yes Do you feel safe going back to the place where you live?: Yes      Need for Family Participation in Patient Care: Yes (Comment) Care giver support system in place?: Yes (comment)   Criminal Activity/Legal Involvement Pertinent to Current Situation/Hospitalization: No - Comment as needed  Activities of Daily Living Home Assistive Devices/Equipment: None ADL Screening (condition at time of admission) Patient's cognitive ability adequate to safely complete daily activities?: Yes Is the patient deaf or have difficulty hearing?: No Does the patient have difficulty seeing, even when wearing  glasses/contacts?: No Does the patient have difficulty concentrating, remembering, or making decisions?: No Patient able to express need for assistance with ADLs?: Yes Does the patient have difficulty dressing or bathing?: No Independently performs ADLs?: Yes (appropriate for developmental age) Does the patient have difficulty walking or climbing stairs?: No Weakness of Legs: None Weakness of Arms/Hands: None  Permission Sought/Granted                  Emotional Assessment Appearance:: Appears stated age Attitude/Demeanor/Rapport: Engaged Affect (typically observed): Accepting Orientation: : Oriented to Self, Oriented to Place, Oriented to  Time, Oriented to Situation Alcohol / Substance Use: Not Applicable Psych Involvement: No (comment)  Admission diagnosis:  Acute cholecystitis [K81.0] Cholecystitis [K81.9] Abdominal pain, unspecified abdominal location [R10.9] Patient Active Problem List   Diagnosis Date Noted   Cholelithiasis with acute cholecystitis 02/14/2022   Abdominal pain 02/14/2022   Nausea & vomiting 02/14/2022   Mixed hyperlipidemia 02/14/2022   Encounter for antineoplastic immunotherapy 10/20/2021   Neutropenia ([place]) 09/15/2021   Drug-induced neutropenia ([place]) 08/25/2021   Encounter for antineoplastic chemotherapy 07/30/2021   Adenopathy 07/14/2021   Adenocarcinoma, lung ([place]) 07/09/2021   Goals of care, counseling/discussion 07/09/2021   Adenocarcinoma of right lung, stage III ([place]) 06/18/2021   S/P Wedge Resection Right Upper lobe 05/14/2021   S/P CABG x 3    Hypercholesteremia    Mass of upper lobe of right lung    Ventilator dependence ([place])  Coronary artery disease involving native coronary artery of native heart without angina pectoris 05/05/2021   PCP:  [name], MD Pharmacy:   [id number] [place], [place] [address] [place] [address] Phone: [phone] Fax:  [phone]  [place], [place] [address] [place] [address] Phone: [phone] Fax: [phone]     Social Determinants of Health (SDOH) Interventions    Readmission Risk Interventions    02/15/2022    1:47 PM  Readmission Risk Prevention Plan  Transportation Screening Complete  HRI or Home Care Consult Complete  Social Work Consult for [place] Planning/Counseling Complete  Palliative Care Screening Not Applicable

## 2022-02-16 ENCOUNTER — Inpatient Hospital Stay: Payer: Medicare HMO

## 2022-02-16 ENCOUNTER — Other Ambulatory Visit: Payer: Self-pay

## 2022-02-16 ENCOUNTER — Inpatient Hospital Stay: Payer: Medicare HMO | Admitting: Internal Medicine

## 2022-02-16 DIAGNOSIS — K8000 Calculus of gallbladder with acute cholecystitis without obstruction: Secondary | ICD-10-CM | POA: Diagnosis not present

## 2022-02-16 LAB — COMPREHENSIVE METABOLIC PANEL
ALT: 22 U/L (ref 0–44)
AST: 32 U/L (ref 15–41)
Albumin: 3.3 g/dL — ABNORMAL LOW (ref 3.5–5.0)
Alkaline Phosphatase: 57 U/L (ref 38–126)
Anion gap: 9 (ref 5–15)
BUN: 19 mg/dL (ref 8–23)
CO2: 23 mmol/L (ref 22–32)
Calcium: 8.7 mg/dL — ABNORMAL LOW (ref 8.9–10.3)
Chloride: 102 mmol/L (ref 98–111)
Creatinine, Ser: 1.39 mg/dL — ABNORMAL HIGH (ref 0.61–1.24)
GFR, Estimated: 52 mL/min — ABNORMAL LOW (ref >60)
Glucose, Bld: 143 mg/dL — ABNORMAL HIGH (ref 70–99)
Potassium: 4.1 mmol/L (ref 3.5–5.1)
Sodium: 134 mmol/L — ABNORMAL LOW (ref 135–145)
Total Bilirubin: 1.2 mg/dL (ref 0.3–1.2)
Total Protein: 6.7 g/dL (ref 6.5–8.1)

## 2022-02-16 LAB — CBC
HCT: 35.3 % — ABNORMAL LOW (ref 39.0–52.0)
Hemoglobin: 11.7 g/dL — ABNORMAL LOW (ref 13.0–17.0)
MCH: 30 pg (ref 26.0–34.0)
MCHC: 33.1 g/dL (ref 30.0–36.0)
MCV: 90.5 fL (ref 80.0–100.0)
Platelets: 170 10*3/uL (ref 150–400)
RBC: 3.9 MIL/uL — ABNORMAL LOW (ref 4.22–5.81)
RDW: 13.2 % (ref 11.5–15.5)
WBC: 11.4 10*3/uL — ABNORMAL HIGH (ref 4.0–10.5)
nRBC: 0 % (ref 0.0–0.2)

## 2022-02-16 LAB — MAGNESIUM: Magnesium: 1.9 mg/dL (ref 1.7–2.4)

## 2022-02-16 NOTE — Progress Notes (Signed)
Patient discharged home today, transported home by family. Discharge paperwork went over with patient, patient verbalized understanding. Belongings sent home with patient.

## 2022-02-16 NOTE — Progress Notes (Signed)
1 Day Post-Op  Subjective: Patient has no complaints.  Is eating breakfast well.  Preoperative pain has resolved.  Objective: Vital signs in last 24 hours: Temp:  [97.4 F (36.3 C)-98.1 F (36.7 C)] 97.4 F (36.3 C) (11/06 0453) Pulse Rate:  [79-92] 86 (11/06 0453) Resp:  [11-22] 14 (11/06 0453) BP: (105-142)/(52-97) 128/81 (11/06 0453) SpO2:  [94 %-99 %] 94 % (11/06 0453) Last BM Date :  (several days ago-per pt)  Intake/Output from previous day: 11/05 0701 - 11/06 0700 In: 1540 [P.O.:240; I.V.:1200; IV Piggyback:100] Out: 12 [Blood:10] Intake/Output this shift: No intake/output data recorded.  General appearance: alert, cooperative, and no distress Resp: clear to auscultation bilaterally Cardio: regular rate and rhythm, S1, S2 normal, no murmur, click, rub or gallop GI: Soft, incisions healing well.  Lab Results:  Recent Labs    02/15/22 0350 02/16/22 0422  WBC 6.1 11.4*  HGB 11.3* 11.7*  HCT 33.8* 35.3*  PLT 162 170   BMET Recent Labs    02/15/22 0350 02/16/22 0422  NA 134* 134*  K 3.8 4.1  CL 102 102  CO2 24 23  GLUCOSE 87 143*  BUN 17 19  CREATININE 1.40* 1.39*  CALCIUM 8.7* 8.7*   PT/INR No results for input(s): "LABPROT", "INR" in the last 72 hours.  Studies/Results: US Abdomen Limited RUQ (LIVER/GB)  Result Date: 02/14/2022 CLINICAL DATA:  Epigastric pain with nausea and vomiting. Cholelithiasis. EXAM: ULTRASOUND ABDOMEN LIMITED RIGHT UPPER QUADRANT COMPARISON:  None Available. FINDINGS: Gallbladder: Gallbladder is distended and contains echogenic sludge. Gallstone in the gallbladder neck is better visualized on CT also performed today. Diffuse gallbladder wall thickening is seen measuring up to 6 mm. These findings are consistent with acute cholecystitis. Common bile duct: Diameter: 5 mm, within normal limits. Liver: No focal lesion identified. Within normal limits in parenchymal echogenicity. Portal vein is patent on color Doppler imaging with  normal direction of blood flow towards the liver. Other: None. IMPRESSION: Distended gallbladder with small gallstone in the neck and diffuse gallbladder wall thickening, consistent with acute cholecystitis. No evidence of biliary ductal dilatation. Electronically Signed   By: Marlaine Hind M.D.   On: 02/14/2022 11:27    Anti-infectives: Anti-infectives (From admission, onward)    Start     Dose/Rate Route Frequency Ordered Stop   02/15/22 0600  cefoTEtan (CEFOTAN) 2 g in sodium chloride 0.9 % 100 mL IVPB        2 g 200 mL/hr over 30 Minutes Intravenous On call to O.R. 02/14/22 1943 02/15/22 1432   02/14/22 1000  piperacillin-tazobactam (ZOSYN) IVPB 3.375 g        3.375 g 12.5 mL/hr over 240 Minutes Intravenous Every 8 hours 02/14/22 0518     02/14/22 0300  piperacillin-tazobactam (ZOSYN) IVPB 3.375 g        3.375 g 100 mL/hr over 30 Minutes Intravenous  Once 02/14/22 0250 02/14/22 0327       Assessment/Plan: s/p Procedure(s): LAPAROSCOPIC CHOLECYSTECTOMY Impression: Stable on postoperative day 1.  Okay for discharge from surgery standpoint.  Follow-up in 2 weeks by virtual visit by Dr. Constance Haw.  Discussed with Dr. Manuella Ghazi.  LOS: 2 days    Aviva Signs 02/16/2022

## 2022-02-16 NOTE — Discharge Summary (Signed)
Physician Discharge Summary  Kevin [name] GDJ:[id number] DOB: February 08, 1941 DOA: 02/14/2022  PCP: [name], MD  Admit date: 02/14/2022  Discharge date: 02/16/2022  Admitted From:Home  Disposition:  Home  Recommendations for Outpatient Follow-up:  Follow up with PCP in 4 weeks and repeat BMP Follow-up with Dr. Constance Haw as scheduled 11/22 Continue other home medications as prior  Home Health: None  Equipment/Devices: None  Discharge Condition:Stable  CODE STATUS: Full  Diet recommendation: Heart Healthy  Brief/Interim Summary:  Kevin [name] is a 81 y.o. male with medical history significant of lung cancer, CAD, hyperlipidemia who presents to the emergency department due to abdominal pain which started the evening prior to admission.  He was noted to have findings of cholelithiasis and acute cholecystitis and was started on IV fluids as well as empiric Zosyn.  He had repeat ultrasound the following day again demonstrating acute cholecystitis and underwent laparoscopic cholecystectomy.  He is now doing well with no further symptomatology and states that pain is well controlled.  He is in stable condition for discharge from general surgery standpoint and will follow-up as noted above.  He may resume his usual pain medications.  Discharge Diagnoses:  Principal Problem:   Cholelithiasis with acute cholecystitis Active Problems:   Coronary artery disease involving native coronary artery of native heart without angina pectoris   Adenocarcinoma, lung (HCC)   Abdominal pain   Nausea & vomiting   Mixed hyperlipidemia  Principal discharge diagnosis: Acute cholecystitis status post laparoscopic cholecystectomy 11/5.  Discharge Instructions  Discharge Instructions     Diet - low sodium heart healthy   Complete by: As directed    If the dressing is still on your incision site when you go home, remove it on the third day after your surgery date. Remove dressing if it begins to fall  off, or if it is dirty or damaged before the third day.   Complete by: As directed    Increase activity slowly   Complete by: As directed       Allergies as of 02/16/2022   No Known Allergies      Medication List     TAKE these medications    aspirin EC 81 MG tablet Take 81 mg by mouth daily.   atorvastatin 80 MG tablet Commonly known as: LIPITOR Take 1 tablet (80 mg total) by mouth daily.   benzonatate 100 MG capsule Commonly known as: TESSALON Take 100 mg by mouth 3 (three) times daily.   CALCIUM 600 + D PO Take 1,200 mg by mouth daily.   Dialyvite Vitamin D 5000 125 MCG (5000 UT) capsule Generic drug: Cholecalciferol Take 5,000 Units by mouth daily.   metoprolol tartrate 25 MG tablet Commonly known as: LOPRESSOR Take 0.5 tablets (12.5 mg total) by mouth 2 (two) times daily.   nitroGLYCERIN 0.4 MG SL tablet Commonly known as: NITROSTAT Place 0.4 mg under the tongue every 5 (five) minutes as needed for chest pain.               Discharge Care Instructions  (From admission, onward)           Start     Ordered   02/16/22 0000  If the dressing is still on your incision site when you go home, remove it on the third day after your surgery date. Remove dressing if it begins to fall off, or if it is dirty or damaged before the third day.        02/16/22 6222  Follow-up Information     Virl Cagey, MD Follow up on 03/04/2022.   Specialty: General Surgery Why: post op phone for gallbladder Contact information: [address] [place] [address] [phone]         [name], MD. Schedule an appointment as soon as possible for a visit in 4 week(s).   Specialty: Internal Medicine Contact information: [place] [address] [phone]                No Known Allergies  Consultations: General surgery   Procedures/Studies: US Abdomen Limited RUQ (LIVER/GB)  Result Date: 02/14/2022 CLINICAL  DATA:  Epigastric pain with nausea and vomiting. Cholelithiasis. EXAM: ULTRASOUND ABDOMEN LIMITED RIGHT UPPER QUADRANT COMPARISON:  None Available. FINDINGS: Gallbladder: Gallbladder is distended and contains echogenic sludge. Gallstone in the gallbladder neck is better visualized on CT also performed today. Diffuse gallbladder wall thickening is seen measuring up to 6 mm. These findings are consistent with acute cholecystitis. Common bile duct: Diameter: 5 mm, within normal limits. Liver: No focal lesion identified. Within normal limits in parenchymal echogenicity. Portal vein is patent on color Doppler imaging with normal direction of blood flow towards the liver. Other: None. IMPRESSION: Distended gallbladder with small gallstone in the neck and diffuse gallbladder wall thickening, consistent with acute cholecystitis. No evidence of biliary ductal dilatation. Electronically Signed   By: Marlaine Hind M.D.   On: 02/14/2022 11:27   CT ABDOMEN PELVIS W CONTRAST  Result Date: 02/14/2022 CLINICAL DATA:  Mid and epigastric pain with nausea and vomiting EXAM: CT ABDOMEN AND PELVIS WITH CONTRAST TECHNIQUE: Multidetector CT imaging of the abdomen and pelvis was performed using the standard protocol following bolus administration of intravenous contrast. RADIATION DOSE REDUCTION: This exam was performed according to the departmental dose-optimization program which includes automated exposure control, adjustment of the mA and/or kV according to patient size and/or use of iterative reconstruction technique. CONTRAST:  12mL OMNIPAQUE IOHEXOL 300 MG/ML  SOLN COMPARISON:  PET/CT 07/07/2021 FINDINGS: Lower chest: Emphysema. Motion obscures the lung bases. Scattered areas of scarring. No acute abnormality. Hepatobiliary: 7 mm gallstone within the cystic duct with mild adjacent stranding. Mild pericholecystic stranding. No intra hepatic biliary dilation. The common bile duct is normal in caliber. No suspicious hepatic lesion.  Pancreas: Unremarkable. Spleen: Unremarkable. Adrenals/Urinary Tract: Adrenal glands are unremarkable. Bilateral cortical renal scarring. Nonobstructing right nephrolithiasis. No ureteral stones or hydronephrosis. Unremarkable bladder. Stomach/Bowel: Unremarkable stomach. Normal caliber large and small bowel. Normal appendix. Colonic diverticulosis without diverticulitis. Vascular/Lymphatic: Aortic atherosclerosis. No enlarged abdominal or pelvic lymph nodes. Reproductive: Clips in the prostate gland. Coarse calcification in the prostate gland or bladder wall. Other: No free intraperitoneal fluid or air. Musculoskeletal: No acute or significant osseous findings. L4 vertebroplasty and height loss. IMPRESSION: Findings suggestive of acute cholecystitis secondary to 7 mm stone in the cystic duct. Aortic Atherosclerosis (ICD10-I70.0) and Emphysema (ICD10-J43.9). Electronically Signed   By: Placido Sou M.D.   On: 02/14/2022 02:33   DG Chest Port 1 View  Result Date: 02/14/2022 CLINICAL DATA:  Epigastric pain EXAM: PORTABLE CHEST 1 VIEW COMPARISON:  CT chest dated 01/15/2022 FINDINGS: Subpleural scarring in the lateral right upper lobe. Postsurgical changes related to right upper lobe wedge resection. Associated nodularity on recent prior CT is not radiographically evident. Left lung is clear. The heart is top-normal in size. Postsurgical changes related to prior CABG. Median sternotomy. IMPRESSION: Postsurgical changes related to right upper lobe wedge resection. Associated nodularity on recent prior CT  is not radiographically evident. Adjacent subpleural scarring. No evidence of acute cardiopulmonary disease. Electronically Signed   By: Julian Hy M.D.   On: 02/14/2022 01:38     Discharge Exam: Vitals:   02/15/22 2100 02/16/22 0453  BP: (!) 105/52 128/81  Pulse: 82 86  Resp: 16 14  Temp: 97.8 F (36.6 C) (!) 97.4 F (36.3 C)  SpO2: 99% 94%   Vitals:   02/15/22 1540 02/15/22 1545 02/15/22  2100 02/16/22 0453  BP:  (!) 142/97 (!) 105/52 128/81  Pulse: 82 79 82 86  Resp: 12 14 16 14   Temp:   97.8 F (36.6 C) (!) 97.4 F (36.3 C)  TempSrc:   Oral Oral  SpO2: 97% 96% 99% 94%  Weight:      Height:        General: Pt is alert, awake, not in acute distress Cardiovascular: RRR, S1/S2 +, no rubs, no gallops Respiratory: CTA bilaterally, no wheezing, no rhonchi Abdominal: Soft, NT, ND, bowel sounds +, incisions clean dry and intact Extremities: no edema, no cyanosis    The results of significant diagnostics from this hospitalization (including imaging, microbiology, ancillary and laboratory) are listed below for reference.     Microbiology: Recent Results (from the past 240 hour(s))  Culture, blood (Routine X 2) w Reflex to ID Panel     Status: None (Preliminary result)   Collection Time: 02/14/22  5:44 AM   Specimen: Left Antecubital; Blood  Result Value Ref Range Status   Specimen Description   Final    LEFT ANTECUBITAL BOTTLES DRAWN AEROBIC AND ANAEROBIC   Special Requests   Final    Blood Culture results may not be optimal due to an excessive volume of blood received in culture bottles   Culture   Final    NO GROWTH 2 DAYS Performed at [facility], [address] [place], [place] [address]    Report Status PENDING  Incomplete  Culture, blood (Routine X 2) w Reflex to ID Panel     Status: None (Preliminary result)   Collection Time: 02/14/22  5:53 AM   Specimen: BLOOD LEFT HAND  Result Value Ref Range Status   Specimen Description   Final    BLOOD LEFT HAND BOTTLES DRAWN AEROBIC AND ANAEROBIC   Special Requests   Final    Blood Culture results may not be optimal due to an excessive volume of blood received in culture bottles   Culture   Final    NO GROWTH 2 DAYS Performed at [facility], [address] [place], [place] [address]    Report Status PENDING  Incomplete     Labs: BNP (last 3 results) No results for input(s): "BNP" in the last 8760  hours. Basic Metabolic Panel: Recent Labs  Lab 02/14/22 0120 02/14/22 0544 02/15/22 0350 02/16/22 0422  NA 135  --  134* 134*  K 4.1  --  3.8 4.1  CL 103  --  102 102  CO2 25  --  24 23  GLUCOSE 147*  --  87 143*  BUN 18  --  17 19  CREATININE 1.25*  --  1.40* 1.39*  CALCIUM 9.3  --  8.7* 8.7*  MG  --  1.8 1.9 1.9  PHOS  --  3.5  --   --    Liver Function Tests: Recent Labs  Lab 02/14/22 0120 02/15/22 0350 02/16/22 0422  AST 17 12* 32  ALT 15 10 22   ALKPHOS 90 57 57  BILITOT 1.4* 1.8* 1.2  PROT 7.7 6.2* 6.7  ALBUMIN 4.2 3.4* 3.3*   Recent Labs  Lab 02/14/22 0120  LIPASE 30   No results for input(s): "AMMONIA" in the last 168 hours. CBC: Recent Labs  Lab 02/14/22 0120 02/15/22 0350 02/16/22 0422  WBC 8.3 6.1 11.4*  HGB 12.3* 11.3* 11.7*  HCT 37.6* 33.8* 35.3*  MCV 91.5 92.1 90.5  PLT 181 162 170   Cardiac Enzymes: No results for input(s): "CKTOTAL", "CKMB", "CKMBINDEX", "TROPONINI" in the last 168 hours. BNP: Invalid input(s): "POCBNP" CBG: No results for input(s): "GLUCAP" in the last 168 hours. D-Dimer No results for input(s): "DDIMER" in the last 72 hours. Hgb A1c No results for input(s): "HGBA1C" in the last 72 hours. Lipid Profile No results for input(s): "CHOL", "HDL", "LDLCALC", "TRIG", "CHOLHDL", "LDLDIRECT" in the last 72 hours. Thyroid function studies No results for input(s): "TSH", "T4TOTAL", "T3FREE", "THYROIDAB" in the last 72 hours.  Invalid input(s): "FREET3" Anemia work up No results for input(s): "VITAMINB12", "FOLATE", "FERRITIN", "TIBC", "IRON", "RETICCTPCT" in the last 72 hours. Urinalysis    Component Value Date/Time   COLORURINE YELLOW 02/14/2022 0118   APPEARANCEUR HAZY (A) 02/14/2022 0118   APPEARANCEUR Clear 11/11/2021 1442   LABSPEC 1.014 02/14/2022 0118   PHURINE 7.0 02/14/2022 0118   GLUCOSEU 50 (A) 02/14/2022 0118   HGBUR SMALL (A) 02/14/2022 0118   BILIRUBINUR NEGATIVE 02/14/2022 0118   BILIRUBINUR Negative  11/11/2021 1442   KETONESUR 5 (A) 02/14/2022 0118   PROTEINUR 30 (A) 02/14/2022 0118   NITRITE NEGATIVE 02/14/2022 0118   LEUKOCYTESUR NEGATIVE 02/14/2022 0118   Sepsis Labs Recent Labs  Lab 02/14/22 0120 02/15/22 0350 02/16/22 0422  WBC 8.3 6.1 11.4*   Microbiology Recent Results (from the past 240 hour(s))  Culture, blood (Routine X 2) w Reflex to ID Panel     Status: None (Preliminary result)   Collection Time: 02/14/22  5:44 AM   Specimen: Left Antecubital; Blood  Result Value Ref Range Status   Specimen Description   Final    LEFT ANTECUBITAL BOTTLES DRAWN AEROBIC AND ANAEROBIC   Special Requests   Final    Blood Culture results may not be optimal due to an excessive volume of blood received in culture bottles   Culture   Final    NO GROWTH 2 DAYS Performed at [facility], [address] [place], [place] [address]    Report Status PENDING  Incomplete  Culture, blood (Routine X 2) w Reflex to ID Panel     Status: None (Preliminary result)   Collection Time: 02/14/22  5:53 AM   Specimen: BLOOD LEFT HAND  Result Value Ref Range Status   Specimen Description   Final    BLOOD LEFT HAND BOTTLES DRAWN AEROBIC AND ANAEROBIC   Special Requests   Final    Blood Culture results may not be optimal due to an excessive volume of blood received in culture bottles   Culture   Final    NO GROWTH 2 DAYS Performed at [facility], [address] [place], [place] [address]    Report Status PENDING  Incomplete     Time coordinating discharge: 35 minutes  SIGNED:   Rodena Goldmann, DO [facility] 02/16/2022, 9:16 AM  If 7PM-7AM, please contact night-coverage [email or web address]

## 2022-02-17 DIAGNOSIS — Z6823 Body mass index (BMI) 23.0-23.9, adult: Secondary | ICD-10-CM | POA: Diagnosis not present

## 2022-02-17 DIAGNOSIS — Z9049 Acquired absence of other specified parts of digestive tract: Secondary | ICD-10-CM | POA: Diagnosis not present

## 2022-02-17 DIAGNOSIS — Z09 Encounter for follow-up examination after completed treatment for conditions other than malignant neoplasm: Secondary | ICD-10-CM | POA: Diagnosis not present

## 2022-02-17 DIAGNOSIS — Z299 Encounter for prophylactic measures, unspecified: Secondary | ICD-10-CM | POA: Diagnosis not present

## 2022-02-17 DIAGNOSIS — I10 Essential (primary) hypertension: Secondary | ICD-10-CM | POA: Diagnosis not present

## 2022-02-17 LAB — SURGICAL PATHOLOGY

## 2022-02-19 LAB — CULTURE, BLOOD (ROUTINE X 2)
Culture: NO GROWTH
Culture: NO GROWTH

## 2022-02-20 ENCOUNTER — Encounter (HOSPITAL_COMMUNITY): Payer: Self-pay | Admitting: General Surgery

## 2022-02-26 DIAGNOSIS — I10 Essential (primary) hypertension: Secondary | ICD-10-CM | POA: Diagnosis not present

## 2022-02-26 DIAGNOSIS — Z299 Encounter for prophylactic measures, unspecified: Secondary | ICD-10-CM | POA: Diagnosis not present

## 2022-02-26 DIAGNOSIS — R059 Cough, unspecified: Secondary | ICD-10-CM | POA: Diagnosis not present

## 2022-03-03 ENCOUNTER — Ambulatory Visit (INDEPENDENT_AMBULATORY_CARE_PROVIDER_SITE_OTHER): Payer: Medicare HMO | Admitting: General Surgery

## 2022-03-03 DIAGNOSIS — K8001 Calculus of gallbladder with acute cholecystitis with obstruction: Secondary | ICD-10-CM

## 2022-03-03 NOTE — Progress Notes (Signed)
[facility]  I am calling the patient for post operative evaluation. This is not a billable encounter as it is under the [place] charges for the surgery.  The patient had a laparoscopic cholecystectomy on 02/15/22. The patient reports that he is doing well. The are tolerating a diet, having good pain control, and having regular Bms.  The incisions are healing. The patient has no concerns.    Pathology: A.   GALLBLADDER, CHOLECYSTECTOMY:  -    Acute cholecystitis, calculous.  -    No malignancy identified.   Will see the patient PRN.  Activity and diet as tolerated.   Curlene Labrum, MD [facility] [address] [place], [place] [address] [phone] (office)

## 2022-03-04 ENCOUNTER — Encounter: Payer: Medicare HMO | Admitting: General Surgery

## 2022-03-09 DIAGNOSIS — R053 Chronic cough: Secondary | ICD-10-CM | POA: Diagnosis not present

## 2022-03-09 DIAGNOSIS — Z299 Encounter for prophylactic measures, unspecified: Secondary | ICD-10-CM | POA: Diagnosis not present

## 2022-03-09 DIAGNOSIS — J069 Acute upper respiratory infection, unspecified: Secondary | ICD-10-CM | POA: Diagnosis not present

## 2022-03-16 ENCOUNTER — Other Ambulatory Visit: Payer: Self-pay

## 2022-03-16 ENCOUNTER — Inpatient Hospital Stay: Payer: Medicare HMO

## 2022-03-16 ENCOUNTER — Inpatient Hospital Stay: Payer: Medicare HMO | Attending: Internal Medicine

## 2022-03-16 ENCOUNTER — Inpatient Hospital Stay (HOSPITAL_BASED_OUTPATIENT_CLINIC_OR_DEPARTMENT_OTHER): Payer: Medicare HMO | Admitting: Internal Medicine

## 2022-03-16 VITALS — BP 120/73 | HR 73 | Temp 97.9°F | Resp 18 | Wt 167.2 lb

## 2022-03-16 DIAGNOSIS — C3411 Malignant neoplasm of upper lobe, right bronchus or lung: Secondary | ICD-10-CM | POA: Diagnosis not present

## 2022-03-16 DIAGNOSIS — Z9221 Personal history of antineoplastic chemotherapy: Secondary | ICD-10-CM | POA: Insufficient documentation

## 2022-03-16 DIAGNOSIS — C3491 Malignant neoplasm of unspecified part of right bronchus or lung: Secondary | ICD-10-CM | POA: Diagnosis not present

## 2022-03-16 DIAGNOSIS — Z923 Personal history of irradiation: Secondary | ICD-10-CM | POA: Diagnosis not present

## 2022-03-16 DIAGNOSIS — Z5112 Encounter for antineoplastic immunotherapy: Secondary | ICD-10-CM | POA: Diagnosis not present

## 2022-03-16 LAB — CBC WITH DIFFERENTIAL (CANCER CENTER ONLY)
Abs Immature Granulocytes: 0.03 10*3/uL (ref 0.00–0.07)
Basophils Absolute: 0 10*3/uL (ref 0.0–0.1)
Basophils Relative: 0 %
Eosinophils Absolute: 0.1 10*3/uL (ref 0.0–0.5)
Eosinophils Relative: 1 %
HCT: 33.9 % — ABNORMAL LOW (ref 39.0–52.0)
Hemoglobin: 11.5 g/dL — ABNORMAL LOW (ref 13.0–17.0)
Immature Granulocytes: 0 %
Lymphocytes Relative: 9 %
Lymphs Abs: 0.8 10*3/uL (ref 0.7–4.0)
MCH: 30.7 pg (ref 26.0–34.0)
MCHC: 33.9 g/dL (ref 30.0–36.0)
MCV: 90.6 fL (ref 80.0–100.0)
Monocytes Absolute: 0.7 10*3/uL (ref 0.1–1.0)
Monocytes Relative: 8 %
Neutro Abs: 7.8 10*3/uL — ABNORMAL HIGH (ref 1.7–7.7)
Neutrophils Relative %: 82 %
Platelet Count: 178 10*3/uL (ref 150–400)
RBC: 3.74 MIL/uL — ABNORMAL LOW (ref 4.22–5.81)
RDW: 14.3 % (ref 11.5–15.5)
WBC Count: 9.4 10*3/uL (ref 4.0–10.5)
nRBC: 0 % (ref 0.0–0.2)

## 2022-03-16 LAB — CMP (CANCER CENTER ONLY)
ALT: 13 U/L (ref 0–44)
AST: 9 U/L — ABNORMAL LOW (ref 15–41)
Albumin: 3.8 g/dL (ref 3.5–5.0)
Alkaline Phosphatase: 81 U/L (ref 38–126)
Anion gap: 6 (ref 5–15)
BUN: 21 mg/dL (ref 8–23)
CO2: 27 mmol/L (ref 22–32)
Calcium: 9.4 mg/dL (ref 8.9–10.3)
Chloride: 104 mmol/L (ref 98–111)
Creatinine: 1.23 mg/dL (ref 0.61–1.24)
GFR, Estimated: 60 mL/min — ABNORMAL LOW (ref >60)
Glucose, Bld: 97 mg/dL (ref 70–99)
Potassium: 4 mmol/L (ref 3.5–5.1)
Sodium: 137 mmol/L (ref 135–145)
Total Bilirubin: 0.7 mg/dL (ref 0.3–1.2)
Total Protein: 6.6 g/dL (ref 6.5–8.1)

## 2022-03-16 MED ORDER — SODIUM CHLORIDE 0.9 % IV SOLN: Freq: Once | INTRAVENOUS | Status: AC

## 2022-03-16 MED ORDER — LORAZEPAM 2 MG/ML IJ SOLN
INTRAMUSCULAR | Status: AC
  Filled 2022-03-16: qty 1

## 2022-03-16 MED ORDER — SODIUM CHLORIDE 0.9 % IV SOLN
1500.0000 mg | Freq: Once | INTRAVENOUS | Status: AC
  Administered 2022-03-16: 1500 mg via INTRAVENOUS
  Filled 2022-03-16: qty 30

## 2022-03-16 NOTE — Patient Instructions (Signed)
[facility]  Discharge Instructions: Thank you for choosing [facility] to provide your oncology and hematology care.   If you have a lab appointment with the [facility], please go directly to the [facility] and check in at the registration area.   Wear comfortable clothing and clothing appropriate for easy access to any Portacath or PICC line.   We strive to give you quality time with your provider. You may need to reschedule your appointment if you arrive late (15 or more minutes).  Arriving late affects you and other patients whose appointments are after yours.  Also, if you miss three or more appointments without notifying the office, you may be dismissed from the clinic at the provider's discretion.      For prescription refill requests, have your pharmacy contact our office and allow 72 hours for refills to be completed.    Today you received the following chemotherapy and/or immunotherapy agents: durvalumab      To help prevent nausea and vomiting after your treatment, we encourage you to take your nausea medication as directed.  BELOW ARE SYMPTOMS THAT SHOULD BE REPORTED IMMEDIATELY: *FEVER GREATER THAN 100.4 F (38 C) OR HIGHER *CHILLS OR SWEATING *NAUSEA AND VOMITING THAT IS NOT CONTROLLED WITH YOUR NAUSEA MEDICATION *UNUSUAL SHORTNESS OF BREATH *UNUSUAL BRUISING OR BLEEDING *URINARY PROBLEMS (pain or burning when urinating, or frequent urination) *BOWEL PROBLEMS (unusual diarrhea, constipation, pain near the anus) TENDERNESS IN MOUTH AND THROAT WITH OR WITHOUT PRESENCE OF ULCERS (sore throat, sores in mouth, or a toothache) UNUSUAL RASH, SWELLING OR PAIN  UNUSUAL VAGINAL DISCHARGE OR ITCHING   Items with * indicate a potential emergency and should be followed up as soon as possible or go to the Emergency Department if any problems should occur.  Please show the CHEMOTHERAPY ALERT CARD or IMMUNOTHERAPY ALERT CARD at check-in to  the Emergency Department and triage nurse.  Should you have questions after your visit or need to cancel or reschedule your appointment, please contact [facility]  Dept: [phone]  and follow the prompts.  Office hours are 8:00 a.m. to 4:30 p.m. Monday - Friday. Please note that voicemails left after 4:00 p.m. may not be returned until the following business day.  We are closed weekends and major holidays. You have access to a nurse at all times for urgent questions. Please call the main number to the clinic Dept: [phone] and follow the prompts.   For any non-urgent questions, you may also contact your provider using MyChart. We now offer e-Visits for anyone 38 and older to request care online for non-urgent symptoms. For details visit [email or web address].   Also download the MyChart app! Go to the app store, search "MyChart", open the app, select [facility], and log in with your MyChart username and password.  Masks are optional in the cancer centers. If you would like for your care team to wear a mask while they are taking care of you, please let them know. You may have one support person who is at least 79 years old accompany you for your appointments.

## 2022-03-16 NOTE — Progress Notes (Signed)
[facility] [phone]   [phone]  OFFICE PROGRESS NOTE  [name], MD [name] [id number]  DIAGNOSIS: Stage IIIC (T3, N3, M0) non-small cell lung cancer, adenocarcinoma presented with right upper lobe lung mass status post right upper lobe wedge resection but no lymph node dissection.  The tumor measured 3.3 cm with visceral pleural involvement and very close resection margin less than 0.1 cm from the tumor.  This was performed on May 13, 2021 in the same setting of CABG under the care of Dr. Kipp Brood.  PET scan performed in March 2023 showed a right upper lobe nodule adjacent to the wedge resection site small hypermetabolic right hilar, right subcarinal, and left infrahilar lymph nodes which could be reactive but are suspicious for nodal involvement.   Molecular studies showed no actionable mutations and PD-L1 expression was 0%   PRIOR THERAPY:  1) Right upper lobe wedge resection under the care of Dr. Kipp Brood on 05/13/2021. 2) Concurrent chemoradiation with weekly carboplatin for AUC of 2 and paclitaxel 45 Mg/M2.  First dose Aug 11, 2021.  Status post 6 cycles.  Last dose was given September 22, 2021   CURRENT THERAPY: Consolidation treatment with immunotherapy with Imfinzi 1500 Mg IV every 4 weeks.  First dose October 27, 2021.  Status post 4 cycles.  INTERVAL HISTORY: Kevin [name] 81 y.o. male returns to the clinic today for follow-up visit accompanied by his wife.  The patient underwent cholecystectomy in early November 2023.  He is recovering well from his surgery.  He denied having any current chest pain, shortness of breath, but continues to have dry cough with no hemoptysis.  He is scheduled to see Dr. Valeta Harms later this month.  He denied having any significant nausea, vomiting, diarrhea or constipation.  He has no headache or visual changes.  He did not receive cycle #5 at that time.  MEDICAL HISTORY: Past Medical History:   Diagnosis Date   Arthritis    Coronary artery disease    Hyperlipidemia    SCC (squamous cell carcinoma) 05/28/2004   top of left hand curet x3 20f   SCC (squamous cell carcinoma) 09/13/2013   mid back tx cx3 571f  SCC (squamous cell carcinoma) 09/06/2018   left wrist tx with bx   Squamous cell carcinoma of skin 11/16/2003   left ear tip tx curet and cautery [id number]  ALLERGIES:  has No Known Allergies.  MEDICATIONS:  Current Outpatient Medications  Medication Sig Dispense Refill   aspirin EC 81 MG tablet Take 81 mg by mouth daily.     atorvastatin (LIPITOR) 80 MG tablet Take 1 tablet (80 mg total) by mouth daily. 90 tablet 3   benzonatate (TESSALON) 100 MG capsule Take 100 mg by mouth 3 (three) times daily.     Calcium Carb-Cholecalciferol (CALCIUM 600 + D PO) Take 1,200 mg by mouth daily.     Cholecalciferol (DIALYVITE VITAMIN D 5000) 125 MCG (5000 UT) capsule Take 5,000 Units by mouth daily.     metoprolol tartrate (LOPRESSOR) 25 MG tablet Take 0.5 tablets (12.5 mg total) by mouth 2 (two) times daily. 180 tablet 3   nitroGLYCERIN (NITROSTAT) 0.4 MG SL tablet Place 0.4 mg under the tongue every 5 (five) minutes as needed for chest pain.     No current facility-administered medications for this visit.    SURGICAL HISTORY:  Past Surgical History:  Procedure Laterality Date   BRONCHIAL BIOPSY  07/22/2021   Procedure: BRONCHIAL BIOPSIES;  Surgeon: Collene Gobble, MD;  Location: [facility];  Service: Cardiopulmonary;;   BRONCHIAL BRUSHINGS  07/22/2021   Procedure: BRONCHIAL BRUSHINGS;  Surgeon: Collene Gobble, MD;  Location: [facility];  Service: Cardiopulmonary;;   BRONCHIAL NEEDLE ASPIRATION BIOPSY  07/22/2021   Procedure: BRONCHIAL NEEDLE ASPIRATION BIOPSIES;  Surgeon: Collene Gobble, MD;  Location: [facility];  Service: Cardiopulmonary;;   CHOLECYSTECTOMY N/A 02/15/2022   Procedure: LAPAROSCOPIC CHOLECYSTECTOMY;  Surgeon: Virl Cagey, MD;  Location: [facility];   Service: General;  Laterality: N/A;   CORONARY ARTERY BYPASS GRAFT N/A 05/13/2021   Procedure: CORONARY ARTERY BYPASS GRAFTING (CABG) TIMES 3  , ON PUMP, USING LEFT INTERNAL MAMMARY ARTERY AND RIGHT GREATER SAPHENOUS VEIN HARVESTED ENDOSCOPICALLY;  Surgeon: Lajuana Matte, MD;  Location: [facility];  Service: Open Heart Surgery;  Laterality: N/A;   ENDOVEIN HARVEST OF GREATER SAPHENOUS VEIN Right 05/13/2021   Procedure: ENDOVEIN HARVEST OF GREATER SAPHENOUS VEIN;  Surgeon: Lajuana Matte, MD;  Location: [facility];  Service: Open Heart Surgery;  Laterality: Right;   epidural     back pain   IR CT SPINE LTD  06/18/2020   IR KYPHO LUMBAR INC FX REDUCE BONE BX UNI/BIL CANNULATION INC/IMAGING  06/18/2020   LEFT HEART CATH AND CORONARY ANGIOGRAPHY N/A 05/06/2021   Procedure: LEFT HEART CATH AND CORONARY ANGIOGRAPHY;  Surgeon: [name], MD;  Location: [facility];  Service: Cardiovascular;  Laterality: N/A;   LUNG BIOPSY Right 05/13/2021   Procedure: RIGHT LUNG BIOPSY;  Surgeon: Lajuana Matte, MD;  Location: [facility];  Service: Open Heart Surgery;  Laterality: Right;   MINOR HEMORRHOIDECTOMY     SHOULDER SURGERY     TEE WITHOUT CARDIOVERSION N/A 05/13/2021   Procedure: TRANSESOPHAGEAL ECHOCARDIOGRAM (TEE);  Surgeon: Lajuana Matte, MD;  Location: [facility];  Service: Open Heart Surgery;  Laterality: N/A;   TRANSURETHRAL RESECTION OF BLADDER TUMOR  2014   TRANSURETHRAL RESECTION OF BLADDER TUMOR N/A 05/19/2018   Procedure: TRANSURETHRAL RESECTION OF BLADDER TUMOR (TURBT) AND (INTRAVESICAL GEMCITABINE INSTILLED IN BLADDER IN PACU) ;  Surgeon: Franchot Gallo, MD;  Location: [facility];  Service: Urology;  Laterality: N/A;  [place] Bilateral 07/22/2021   Procedure: VIDEO BRONCHOSCOPY WITH ENDOBRONCHIAL ULTRASOUND;  Surgeon: Collene Gobble, MD;  Location: [facility];  Service: Cardiopulmonary;  Laterality: Bilateral;    REVIEW OF SYSTEMS:  A  comprehensive review of systems was negative except for: Respiratory: positive for cough   PHYSICAL EXAMINATION: General appearance: alert, cooperative, and no distress Head: Normocephalic, without obvious abnormality, atraumatic Neck: no adenopathy, no JVD, supple, symmetrical, trachea midline, and thyroid not enlarged, symmetric, no tenderness/mass/nodules Lymph nodes: Cervical, supraclavicular, and axillary nodes normal. Resp: clear to auscultation bilaterally Back: symmetric, no curvature. ROM normal. No CVA tenderness. Cardio: regular rate and rhythm, S1, S2 normal, no murmur, click, rub or gallop GI: soft, non-tender; bowel sounds normal; no masses,  no organomegaly Extremities: extremities normal, atraumatic, no cyanosis or edema  ECOG PERFORMANCE STATUS: 0 - Asymptomatic  Blood pressure 120/73, pulse 73, temperature 97.9 F (36.6 C), temperature source Oral, resp. rate 18, weight 167 lb 3 oz (75.8 kg), SpO2 98 %.  LABORATORY DATA: Lab Results  Component Value Date   WBC 9.4 03/16/2022   HGB 11.5 (L) 03/16/2022   HCT 33.9 (L) 03/16/2022   MCV 90.6 03/16/2022   PLT 178 03/16/2022      Chemistry  Component Value Date/Time   NA 134 (L) 02/16/2022 0422   NA 139 05/01/2021 1146   K 4.1 02/16/2022 0422   CL 102 02/16/2022 0422   CO2 23 02/16/2022 0422   BUN 19 02/16/2022 0422   BUN 20 05/01/2021 1146   CREATININE 1.39 (H) 02/16/2022 0422   CREATININE 1.20 01/19/2022 1046      Component Value Date/Time   CALCIUM 8.7 (L) 02/16/2022 0422   ALKPHOS 57 02/16/2022 0422   AST 32 02/16/2022 0422   AST 11 (L) 01/19/2022 1046   ALT 22 02/16/2022 0422   ALT 8 01/19/2022 1046   BILITOT 1.2 02/16/2022 0422   BILITOT 0.7 01/19/2022 1046       RADIOGRAPHIC STUDIES: US Abdomen Limited RUQ (LIVER/GB)  Result Date: 02/14/2022 CLINICAL DATA:  Epigastric pain with nausea and vomiting. Cholelithiasis. EXAM: ULTRASOUND ABDOMEN LIMITED RIGHT UPPER QUADRANT COMPARISON:  None  Available. FINDINGS: Gallbladder: Gallbladder is distended and contains echogenic sludge. Gallstone in the gallbladder neck is better visualized on CT also performed today. Diffuse gallbladder wall thickening is seen measuring up to 6 mm. These findings are consistent with acute cholecystitis. Common bile duct: Diameter: 5 mm, within normal limits. Liver: No focal lesion identified. Within normal limits in parenchymal echogenicity. Portal vein is patent on color Doppler imaging with normal direction of blood flow towards the liver. Other: None. IMPRESSION: Distended gallbladder with small gallstone in the neck and diffuse gallbladder wall thickening, consistent with acute cholecystitis. No evidence of biliary ductal dilatation. Electronically Signed   By: Marlaine Hind M.D.   On: 02/14/2022 11:27    ASSESSMENT AND PLAN: This is a very pleasant 81 years old white male diagnosed with stage IIIC (T3, N3, M0) non-small cell lung cancer, adenocarcinoma with no actionable mutations and PD-L1 expression of 0% diagnosed in January 2023 status post wedge resection of the right upper lobe lung mass in a setting of CABG under the care of Dr. Kipp Brood but the PET scan showed residual disease in the right upper lobe in addition to hypermetabolic right hilar, subcarinal and left infrahilar lymph nodes. The patient was seen by Dr. Lamonte Sakai and biopsy of the right upper lobe as well as the subcarinal and left hilar lymph nodes were positive for malignancy consistent with non-small cell lung cancer, adenocarcinoma. He underwent a course of concurrent chemoradiation with weekly carboplatin for AUC of 2 and paclitaxel 45 Mg/M2 status post 6 cycles.  The patient tolerated this previous course of concurrent chemoradiation fairly well except for fatigue and mild odynophagia. The patient is currently undergoing consolidation treatment with immunotherapy with Imfinzi 1500 Mg IV every 4 weeks status post 4 cycles.    He has been  tolerating this treatment well but cycle #5 was delayed because of cholecystectomy at the time of the treatment. He is recovering well and recommended for him to proceed with cycle #5 today as planned. I will see the patient back for follow-up visit in 4 weeks for evaluation before starting cycle #6. The patient was advised to call immediately if he has any other concerning symptoms in the interval. The patient voices understanding of current disease status and treatment options and is in agreement with the current care plan. All questions were answered. The patient knows to call the clinic with any problems, questions or concerns. We can certainly see the patient much sooner if necessary. The total time spent in the appointment was 20 minutes.   Disclaimer: This note was dictated with voice recognition software.  Similar sounding words can inadvertently be transcribed and may not be corrected upon review.

## 2022-03-18 ENCOUNTER — Encounter: Payer: Self-pay | Admitting: Medical Oncology

## 2022-03-18 ENCOUNTER — Telehealth: Payer: Self-pay | Admitting: Medical Oncology

## 2022-03-18 NOTE — Telephone Encounter (Signed)
Letter faxed to [facility] re: use of Durvalumab in pts treatments.

## 2022-03-25 ENCOUNTER — Telehealth: Payer: Self-pay | Admitting: Internal Medicine

## 2022-03-25 NOTE — Telephone Encounter (Signed)
Patient called to verify upcoming appointment. Patient notified.

## 2022-04-03 ENCOUNTER — Telehealth: Payer: Self-pay | Admitting: Pulmonary Disease

## 2022-04-03 ENCOUNTER — Ambulatory Visit (INDEPENDENT_AMBULATORY_CARE_PROVIDER_SITE_OTHER): Payer: Medicare HMO | Admitting: Pulmonary Disease

## 2022-04-03 ENCOUNTER — Encounter: Payer: Self-pay | Admitting: Pulmonary Disease

## 2022-04-03 VITALS — BP 122/62 | HR 75 | Temp 98.0°F | Ht 71.0 in | Wt 165.0 lb

## 2022-04-03 DIAGNOSIS — R052 Subacute cough: Secondary | ICD-10-CM | POA: Diagnosis not present

## 2022-04-03 DIAGNOSIS — Z923 Personal history of irradiation: Secondary | ICD-10-CM

## 2022-04-03 DIAGNOSIS — J438 Other emphysema: Secondary | ICD-10-CM | POA: Diagnosis not present

## 2022-04-03 DIAGNOSIS — Z902 Acquired absence of lung [part of]: Secondary | ICD-10-CM | POA: Diagnosis not present

## 2022-04-03 DIAGNOSIS — C3491 Malignant neoplasm of unspecified part of right bronchus or lung: Secondary | ICD-10-CM

## 2022-04-03 MED ORDER — BREZTRI AEROSPHERE 160-9-4.8 MCG/ACT IN AERO: 2.0000 | INHALATION_SPRAY | Freq: Two times a day (BID) | RESPIRATORY_TRACT | 0 refills | Status: DC

## 2022-04-03 MED ORDER — BREZTRI AEROSPHERE 160-9-4.8 MCG/ACT IN AERO: 2.0000 | INHALATION_SPRAY | Freq: Two times a day (BID) | RESPIRATORY_TRACT | 5 refills | Status: DC

## 2022-04-03 NOTE — Addendum Note (Signed)
Addended by: Alvin Critchley on: 04/03/2022 10:27 AM   Modules accepted: Orders

## 2022-04-03 NOTE — Telephone Encounter (Signed)
Called and left patient a detailed message that I was getting his Breztri sent to [facility] in [place] as he requested and that I was sorry it did not go trough when he was here with Dr Valeta Harms today. I told him to call office back with any questions or concerns. Nothing further needed

## 2022-04-03 NOTE — Addendum Note (Signed)
Addended by: Alvin Critchley on: 04/03/2022 11:31 AM   Modules accepted: Orders

## 2022-04-03 NOTE — Patient Instructions (Signed)
Thank you for visiting Dr. Valeta Harms at [facility]. Today we recommend the following:  Samples of breztri, 2 puffs twice daily  Nebulizer machine  Albuterol neb solution, every 6 hrs as needed   Return in about 3 months (around 07/03/2022) for with APP or Dr. Valeta Harms.    Please do your part to reduce the spread of COVID-19.

## 2022-04-03 NOTE — Progress Notes (Signed)
Synopsis: Referred in April 2023 for adenopathy by [name], MD  Subjective:   PATIENT ID: Kevin [name] GENDER: male DOB: Jan 06, 1941, MRN: [id number]  Chief Complaint  Patient presents with   Follow-up    Congestion, cough, runny nose in AM    This is a 81 year old gentleman, Past medical history of coronary artery bypass grafting by Dr. Kipp Brood, was found to have a lung mass at the time of his surgical evaluation.  He had a Wedge resection of the right upper lobe which was consistent with malignancy.  Patient has establish care with medical oncology.  Last seen in the office on 07/09/2021, medical oncology office note reviewed.  Patient diagnosed with a stage IIIb T3, N2M0 non-small cell lung cancer, adenocarcinoma of the right upper lobe tumor at 3.3 cm with visceral pleural involvement.  PET scan concerning for nodal disease.  There was multidisciplinary discussion regarding his case last week at medical thoracic oncology conference.  Nodal disease potentially within the mediastinum and hilum but also possibly considered reactive.  At this changes the plan for consideration for radiation treatment which could be definitive to the upper lobe lesion versus getting more mediastinal radiation.  Radiation oncology was concerned about giving treatment to the mediastinum with a recent sternotomy.  However the patient is still on Plavix.  Patient was referred to me for consideration of videobronchoscope with endobronchial ultrasound of the small lymph nodes to confirm nodal disease.  OV 08/11/2021: Here today for follow-up after biopsy.Patient underwent video bronchoscopy with endobronchial ultrasound and nodal sampling by Dr. Lamonte Sakai. he is not using any inhalers at this time.  From a respiratory standpoint he is able to complete all of his activities of daily living.  He has a trip planned in September.  He is starting his chemoradiation next week.  OV 04/03/2022: Here today for follow-up.   Initially was seen earlier in the year after bronchoscopy.  Was diagnosed with stage IIIb non-small cell lung cancer.  He is here today complaining of ongoing cough.  He is underwent radiation treatments to the chest and currently undergoing immunotherapy treatments with Dr. Earlie Server.  Recent CT scan of the chest reviewed with patient.  As areas of scarring from previous surgery and radiation.  Also has significant paraseptal emphysema.  Currently only using Symbicort.  Up until the past couple of weeks it only been using it as needed.  Now using it more regularly.  He is sleeping better.  Thinks this may be related to the initiation of his inhaler use.  Does not have a nebulizer at home.    Past Medical History:  Diagnosis Date   Arthritis    Coronary artery disease    Hyperlipidemia    SCC (squamous cell carcinoma) 05/28/2004   top of left hand curet x3 74fu   SCC (squamous cell carcinoma) 09/13/2013   mid back tx cx3 59fu   SCC (squamous cell carcinoma) 09/06/2018   left wrist tx with bx   Squamous cell carcinoma of skin 11/16/2003   left ear tip tx curet and cautery 47fu     Family History  Problem Relation Age of Onset   Alzheimer's disease Mother 57   Congestive Heart Failure Father 52   Heart disease Brother 87   Heart failure Brother 61   Heart failure Brother 60     Past Surgical History:  Procedure Laterality Date   BRONCHIAL BIOPSY  07/22/2021   Procedure: BRONCHIAL BIOPSIES;  Surgeon: Collene Gobble, MD;  [facility]: [facility];  Service: Cardiopulmonary;;   BRONCHIAL BRUSHINGS  07/22/2021   Procedure: BRONCHIAL BRUSHINGS;  Surgeon: Collene Gobble, MD;  Location: [place];  Service: Cardiopulmonary;;   BRONCHIAL NEEDLE ASPIRATION BIOPSY  07/22/2021   Procedure: BRONCHIAL NEEDLE ASPIRATION BIOPSIES;  Surgeon: Collene Gobble, MD;  Location: [facility];  Service: Cardiopulmonary;;   CHOLECYSTECTOMY N/A 02/15/2022   Procedure: LAPAROSCOPIC CHOLECYSTECTOMY;  Surgeon:  Virl Cagey, MD;  Location: [facility];  Service: General;  Laterality: N/A;   CORONARY ARTERY BYPASS GRAFT N/A 05/13/2021   Procedure: CORONARY ARTERY BYPASS GRAFTING (CABG) TIMES 3  , ON PUMP, USING LEFT INTERNAL MAMMARY ARTERY AND RIGHT GREATER SAPHENOUS VEIN HARVESTED ENDOSCOPICALLY;  Surgeon: Lajuana Matte, MD;  Location: [place];  Service: Open Heart Surgery;  Laterality: N/A;   ENDOVEIN HARVEST OF GREATER SAPHENOUS VEIN Right 05/13/2021   Procedure: ENDOVEIN HARVEST OF GREATER SAPHENOUS VEIN;  Surgeon: Lajuana Matte, MD;  Location: [place];  Service: Open Heart Surgery;  Laterality: Right;   epidural     back pain   IR CT SPINE LTD  06/18/2020   IR KYPHO LUMBAR INC FX REDUCE BONE BX UNI/BIL CANNULATION INC/IMAGING  06/18/2020   LEFT HEART CATH AND CORONARY ANGIOGRAPHY N/A 05/06/2021   Procedure: LEFT HEART CATH AND CORONARY ANGIOGRAPHY;  Surgeon: [name], MD;  Location: [facility];  Service: Cardiovascular;  Laterality: N/A;   LUNG BIOPSY Right 05/13/2021   Procedure: RIGHT LUNG BIOPSY;  Surgeon: Lajuana Matte, MD;  Location: [place];  Service: Open Heart Surgery;  Laterality: Right;   MINOR HEMORRHOIDECTOMY     SHOULDER SURGERY     TEE WITHOUT CARDIOVERSION N/A 05/13/2021   Procedure: TRANSESOPHAGEAL ECHOCARDIOGRAM (TEE);  Surgeon: Lajuana Matte, MD;  Location: [place];  Service: Open Heart Surgery;  Laterality: N/A;   TRANSURETHRAL RESECTION OF BLADDER TUMOR  2014   TRANSURETHRAL RESECTION OF BLADDER TUMOR N/A 05/19/2018   Procedure: TRANSURETHRAL RESECTION OF BLADDER TUMOR (TURBT) AND (INTRAVESICAL GEMCITABINE INSTILLED IN BLADDER IN PACU) ;  Surgeon: Franchot Gallo, MD;  [facility];  Service: Urology;  Laterality: N/A;  [place] Bilateral 07/22/2021   Procedure: VIDEO BRONCHOSCOPY WITH ENDOBRONCHIAL ULTRASOUND;  Surgeon: Collene Gobble, MD;  Location: [facility];  Service: Cardiopulmonary;   Laterality: Bilateral;    Social History   Socioeconomic History   Marital status: Married    Spouse name: Not on file   Number of children: 2   Years of education: Not on file   Highest education level: Not on file  Occupational History   Not on file  Tobacco Use   Smoking status: Former    Packs/day: 1.00    Years: 30.00    Total pack years: 30.00    Types: Cigarettes    Quit date: 04/14/1995    Years since quitting: 26.9   Smokeless tobacco: Never  Vaping Use   Vaping Use: Never used  Substance and Sexual Activity   Alcohol use: Never   Drug use: Never   Sexual activity: Not on file  Other Topics Concern   Not on file  Social History Narrative   Not on file   Social Determinants of Health   Financial Resource Strain: Not on file  Food Insecurity: No Food Insecurity (02/14/2022)   Hunger Vital Sign    Worried About Running Out of Food in the Last Year: Never true    Ran Out of Food  in the Last Year: Never true  Transportation Needs: No Transportation Needs (02/14/2022)   PRAPARE - Hydrologist (Medical): No    Lack of Transportation (Non-Medical): No  Physical Activity: Not on file  Stress: Not on file  Social Connections: Not on file  Intimate Partner Violence: Not At Risk (02/14/2022)   Humiliation, Afraid, Rape, and Kick questionnaire    Fear of Current or Ex-Partner: No    Emotionally Abused: No    Physically Abused: No    Sexually Abused: No     No Known Allergies   Outpatient Medications Prior to Visit  Medication Sig Dispense Refill   aspirin EC 81 MG tablet Take 81 mg by mouth daily.     atorvastatin (LIPITOR) 80 MG tablet Take 1 tablet (80 mg total) by mouth daily. 90 tablet 3   benzonatate (TESSALON) 100 MG capsule Take 100 mg by mouth 3 (three) times daily.     Calcium Carb-Cholecalciferol (CALCIUM 600 + D PO) Take 1,200 mg by mouth daily.     Cholecalciferol (DIALYVITE VITAMIN D 5000) 125 MCG (5000 UT) capsule Take  5,000 Units by mouth daily.     metoprolol tartrate (LOPRESSOR) 25 MG tablet Take 0.5 tablets (12.5 mg total) by mouth 2 (two) times daily. 180 tablet 3   nitroGLYCERIN (NITROSTAT) 0.4 MG SL tablet Place 0.4 mg under the tongue every 5 (five) minutes as needed for chest pain.     No facility-administered medications prior to visit.    Review of Systems  Constitutional:  Negative for chills, fever, malaise/fatigue and weight loss.  HENT:  Negative for hearing loss, sore throat and tinnitus.   Eyes:  Negative for blurred vision and double vision.  Respiratory:  Positive for cough, sputum production and shortness of breath. Negative for hemoptysis, wheezing and stridor.   Cardiovascular:  Negative for chest pain, palpitations, orthopnea, leg swelling and PND.  Gastrointestinal:  Negative for abdominal pain, constipation, diarrhea, heartburn, nausea and vomiting.  Genitourinary:  Negative for dysuria, hematuria and urgency.  Musculoskeletal:  Negative for joint pain and myalgias.  Skin:  Negative for itching and rash.  Neurological:  Negative for dizziness, tingling, weakness and headaches.  Endo/Heme/Allergies:  Negative for environmental allergies. Does not bruise/bleed easily.  Psychiatric/Behavioral:  Negative for depression. The patient is not nervous/anxious and does not have insomnia.   All other systems reviewed and are negative.    Objective:  Physical Exam Vitals reviewed.  Constitutional:      General: He is not in acute distress.    Appearance: He is well-developed.  HENT:     Head: Normocephalic and atraumatic.  Eyes:     General: No scleral icterus.    Conjunctiva/sclera: Conjunctivae normal.     Pupils: Pupils are equal, round, and reactive to light.  Neck:     Vascular: No JVD.     Trachea: No tracheal deviation.  Cardiovascular:     Rate and Rhythm: Normal rate and regular rhythm.     Heart sounds: Normal heart sounds. No murmur heard. Pulmonary:     Effort:  Pulmonary effort is normal. No tachypnea, accessory muscle usage or respiratory distress.     Breath sounds: No stridor. No wheezing, rhonchi or rales.     Comments: Diminished breath sounds bilaterally, no crackles no wheeze Abdominal:     General: There is no distension.     Palpations: Abdomen is soft.     Tenderness: There is no abdominal tenderness.  Musculoskeletal:        General: No tenderness.     Cervical back: Neck supple.  Lymphadenopathy:     Cervical: No cervical adenopathy.  Skin:    General: Skin is warm and dry.     Capillary Refill: Capillary refill takes less than 2 seconds.     Findings: No rash.  Neurological:     Mental Status: He is alert and oriented to person, place, and time.  Psychiatric:        Behavior: Behavior normal.      Vitals:   04/03/22 1000  BP: 122/62  Pulse: 75  Temp: 98 F (36.7 C)  SpO2: 98%  Weight: 165 lb (74.8 kg)  Height: 5\' 11"  (1.803 m)   98% on RA BMI Readings from Last 3 Encounters:  04/03/22 23.01 kg/m  03/16/22 23.99 kg/m  02/14/22 24.58 kg/m   Wt Readings from Last 3 Encounters:  04/03/22 165 lb (74.8 kg)  03/16/22 167 lb 3 oz (75.8 kg)  02/14/22 171 lb 4.8 oz (77.7 kg)     CBC    Component Value Date/Time   WBC 9.4 03/16/2022 1010   WBC 11.4 (H) 02/16/2022 0422   RBC 3.74 (L) 03/16/2022 1010   HGB 11.5 (L) 03/16/2022 1010   HGB 14.5 05/01/2021 1146   HCT 33.9 (L) 03/16/2022 1010   HCT 41.2 05/01/2021 1146   PLT 178 03/16/2022 1010   PLT 236 05/01/2021 1146   MCV 90.6 03/16/2022 1010   MCV 90 05/01/2021 1146   MCH 30.7 03/16/2022 1010   MCHC 33.9 03/16/2022 1010   RDW 14.3 03/16/2022 1010   RDW 11.8 05/01/2021 1146   LYMPHSABS 0.8 03/16/2022 1010   MONOABS 0.7 03/16/2022 1010   EOSABS 0.1 03/16/2022 1010   BASOSABS 0.0 03/16/2022 1010     Chest Imaging: 07/07/2021 nuclear medicine pet imaging: Hypermetabolic adenopathy within the mediastinum, right upper lobe hypermetabolic  lesion. Concerning for advanced age bronchogenic carcinoma. The patient's images have been independently reviewed by me.    CT chest October 2023: Right upper lobe pulmonary nodule adjacent to right upper lobe wedge resection relatively unchanged.  Has some linear areas of groundglass opacity consistent with his radiation changes.  Mild inflammatory changes within the left lower lobe.  No other new nodules,.  No evidence of metastatic disease.  Pulmonary Functions Testing Results:     No data to display          FeNO:   Pathology:   Echocardiogram:   Heart Catheterization:     Assessment & Plan:     ICD-10-CM   1. Subacute cough  R05.2     2. Adenocarcinoma of right lung, stage III (HCC)  C34.91     3. History of radiation therapy  Z92.3     4. Status post lobectomy of lung  Z90.2        Discussion:  This is a 81 year old gentleman, rediagnosed with a stage III adenocarcinoma of the lung.  He had a previous CABG sternotomy.  Underwent radiation treatments to the chest.  Currently on immune therapy, already completed his chemoradiation course.  CT radiographic evidence of paraseptal emphysema  Plan: Samples today of Breztri, twice daily New prescription for albuterol nebulizer, albuterol nebulizer solution. Hopefully can use this at least twice a day. Encouraged ongoing pulmonary toiletry techniques.  Airway clearance in the morning. I suspect he will have an ongoing cough.  He has significant parenchymal lung disease related to his emphysema, previous surgery  and scarring related to radiation treatments. If his symptoms get worse or has associated shortness of breath he may have repeat axial CT imaging to see if he has any evidence of pneumonitis related to his ongoing immune therapy however he currently just has cough.  I think we should hold off on any repeat axial CT imaging at this time. Patient to follow-up with me or [name] in 3 months to see if symptoms are any  better.     Current Outpatient Medications:    aspirin EC 81 MG tablet, Take 81 mg by mouth daily., Disp: , Rfl:    atorvastatin (LIPITOR) 80 MG tablet, Take 1 tablet (80 mg total) by mouth daily., Disp: 90 tablet, Rfl: 3   benzonatate (TESSALON) 100 MG capsule, Take 100 mg by mouth 3 (three) times daily., Disp: , Rfl:    Calcium Carb-Cholecalciferol (CALCIUM 600 + D PO), Take 1,200 mg by mouth daily., Disp: , Rfl:    Cholecalciferol (DIALYVITE VITAMIN D 5000) 125 MCG (5000 UT) capsule, Take 5,000 Units by mouth daily., Disp: , Rfl:    metoprolol tartrate (LOPRESSOR) 25 MG tablet, Take 0.5 tablets (12.5 mg total) by mouth 2 (two) times daily., Disp: 180 tablet, Rfl: 3   nitroGLYCERIN (NITROSTAT) 0.4 MG SL tablet, Place 0.4 mg under the tongue every 5 (five) minutes as needed for chest pain., Disp: , Rfl:    Garner Nash, DO [facility] 04/03/2022 10:17 AM

## 2022-04-09 ENCOUNTER — Telehealth: Payer: Self-pay | Admitting: Internal Medicine

## 2022-04-09 NOTE — Telephone Encounter (Signed)
Called patient regarding January and February appointments, patient is notified.

## 2022-04-14 ENCOUNTER — Other Ambulatory Visit: Payer: Self-pay

## 2022-04-14 ENCOUNTER — Encounter: Payer: Self-pay | Admitting: Internal Medicine

## 2022-04-14 ENCOUNTER — Other Ambulatory Visit: Payer: Medicare HMO | Admitting: Urology

## 2022-04-14 ENCOUNTER — Inpatient Hospital Stay: Payer: Medicare HMO

## 2022-04-14 ENCOUNTER — Telehealth: Payer: Self-pay | Admitting: Pulmonary Disease

## 2022-04-14 ENCOUNTER — Other Ambulatory Visit: Payer: Self-pay | Admitting: Medical Oncology

## 2022-04-14 ENCOUNTER — Inpatient Hospital Stay: Payer: Medicare HMO | Attending: Internal Medicine

## 2022-04-14 ENCOUNTER — Inpatient Hospital Stay (HOSPITAL_BASED_OUTPATIENT_CLINIC_OR_DEPARTMENT_OTHER): Payer: Medicare HMO | Admitting: Internal Medicine

## 2022-04-14 VITALS — BP 127/59 | HR 70 | Temp 97.7°F | Resp 16 | Wt 168.3 lb

## 2022-04-14 VITALS — BP 115/53 | HR 81 | Resp 15

## 2022-04-14 DIAGNOSIS — Z79899 Other long term (current) drug therapy: Secondary | ICD-10-CM | POA: Diagnosis not present

## 2022-04-14 DIAGNOSIS — E785 Hyperlipidemia, unspecified: Secondary | ICD-10-CM | POA: Diagnosis not present

## 2022-04-14 DIAGNOSIS — C3411 Malignant neoplasm of upper lobe, right bronchus or lung: Secondary | ICD-10-CM | POA: Insufficient documentation

## 2022-04-14 DIAGNOSIS — Z7982 Long term (current) use of aspirin: Secondary | ICD-10-CM | POA: Diagnosis not present

## 2022-04-14 DIAGNOSIS — C3490 Malignant neoplasm of unspecified part of unspecified bronchus or lung: Secondary | ICD-10-CM

## 2022-04-14 DIAGNOSIS — R052 Subacute cough: Secondary | ICD-10-CM

## 2022-04-14 DIAGNOSIS — Z5112 Encounter for antineoplastic immunotherapy: Secondary | ICD-10-CM | POA: Diagnosis not present

## 2022-04-14 DIAGNOSIS — C3491 Malignant neoplasm of unspecified part of right bronchus or lung: Secondary | ICD-10-CM

## 2022-04-14 DIAGNOSIS — I2510 Atherosclerotic heart disease of native coronary artery without angina pectoris: Secondary | ICD-10-CM | POA: Diagnosis not present

## 2022-04-14 DIAGNOSIS — J449 Chronic obstructive pulmonary disease, unspecified: Secondary | ICD-10-CM

## 2022-04-14 DIAGNOSIS — R0602 Shortness of breath: Secondary | ICD-10-CM

## 2022-04-14 DIAGNOSIS — Z923 Personal history of irradiation: Secondary | ICD-10-CM | POA: Insufficient documentation

## 2022-04-14 LAB — CMP (CANCER CENTER ONLY)
ALT: 10 U/L (ref 0–44)
AST: 12 U/L — ABNORMAL LOW (ref 15–41)
Albumin: 3.9 g/dL (ref 3.5–5.0)
Alkaline Phosphatase: 79 U/L (ref 38–126)
Anion gap: 7 (ref 5–15)
BUN: 16 mg/dL (ref 8–23)
CO2: 26 mmol/L (ref 22–32)
Calcium: 9.2 mg/dL (ref 8.9–10.3)
Chloride: 105 mmol/L (ref 98–111)
Creatinine: 1.24 mg/dL (ref 0.61–1.24)
GFR, Estimated: 59 mL/min — ABNORMAL LOW (ref >60)
Glucose, Bld: 102 mg/dL — ABNORMAL HIGH (ref 70–99)
Potassium: 3.9 mmol/L (ref 3.5–5.1)
Sodium: 138 mmol/L (ref 135–145)
Total Bilirubin: 1 mg/dL (ref 0.3–1.2)
Total Protein: 6.7 g/dL (ref 6.5–8.1)

## 2022-04-14 LAB — CBC WITH DIFFERENTIAL (CANCER CENTER ONLY)
Abs Immature Granulocytes: 0.01 10*3/uL (ref 0.00–0.07)
Basophils Absolute: 0 10*3/uL (ref 0.0–0.1)
Basophils Relative: 1 %
Eosinophils Absolute: 0.1 10*3/uL (ref 0.0–0.5)
Eosinophils Relative: 2 %
HCT: 35.7 % — ABNORMAL LOW (ref 39.0–52.0)
Hemoglobin: 11.9 g/dL — ABNORMAL LOW (ref 13.0–17.0)
Immature Granulocytes: 0 %
Lymphocytes Relative: 17 %
Lymphs Abs: 1 10*3/uL (ref 0.7–4.0)
MCH: 30.6 pg (ref 26.0–34.0)
MCHC: 33.3 g/dL (ref 30.0–36.0)
MCV: 91.8 fL (ref 80.0–100.0)
Monocytes Absolute: 0.6 10*3/uL (ref 0.1–1.0)
Monocytes Relative: 10 %
Neutro Abs: 4.1 10*3/uL (ref 1.7–7.7)
Neutrophils Relative %: 70 %
Platelet Count: 165 10*3/uL (ref 150–400)
RBC: 3.89 MIL/uL — ABNORMAL LOW (ref 4.22–5.81)
RDW: 15.3 % (ref 11.5–15.5)
WBC Count: 5.8 10*3/uL (ref 4.0–10.5)
nRBC: 0 % (ref 0.0–0.2)

## 2022-04-14 LAB — TSH: TSH: 3.585 u[IU]/mL (ref 0.350–4.500)

## 2022-04-14 MED ORDER — SODIUM CHLORIDE 0.9 % IV SOLN: Freq: Once | INTRAVENOUS | Status: AC

## 2022-04-14 MED ORDER — SODIUM CHLORIDE 0.9 % IV SOLN
1500.0000 mg | Freq: Once | INTRAVENOUS | Status: AC
  Administered 2022-04-14: 1500 mg via INTRAVENOUS
  Filled 2022-04-14: qty 30

## 2022-04-14 NOTE — Progress Notes (Signed)
[place] Telephone:([phone]   [phone]  OFFICE PROGRESS NOTE  [name], MD [place] [address]  DIAGNOSIS: Stage IIIC (T3, N3, M0) non-small cell lung cancer, adenocarcinoma presented with right upper lobe lung mass status post right upper lobe wedge resection but no lymph node dissection.  The tumor measured 3.3 cm with visceral pleural involvement and very close resection margin less than 0.1 cm from the tumor.  This was performed on May 13, 2021 in the same setting of CABG under the care of Dr. Kipp Brood.  PET scan performed in March 2023 showed a right upper lobe nodule adjacent to the wedge resection site small hypermetabolic right hilar, right subcarinal, and left infrahilar lymph nodes which could be reactive but are suspicious for nodal involvement.   Molecular studies showed no actionable mutations and PD-L1 expression was 0%   PRIOR THERAPY:  1) Right upper lobe wedge resection under the care of Dr. Kipp Brood on 05/13/2021. 2) Concurrent chemoradiation with weekly carboplatin for AUC of 2 and paclitaxel 45 Mg/M2.  First dose Aug 11, 2021.  Status post 6 cycles.  Last dose was given September 22, 2021   CURRENT THERAPY: Consolidation treatment with immunotherapy with Imfinzi 1500 Mg IV every 4 weeks.  First dose October 27, 2021.  Status post 5 cycles.  INTERVAL HISTORY: Kevin [name] 82 y.o. male returns to the clinic today for follow-up visit accompanied by his wife.  The patient is feeling fine today with no concerning complaints.  He denied having any current chest pain, shortness of breath, cough or hemoptysis.  He has no nausea, vomiting, diarrhea or constipation.  He has no headache or visual changes.  He has no recent weight loss or night sweats.  He continues to tolerate his treatment with immunotherapy fairly well.  He is here today for evaluation before starting cycle #6.  MEDICAL HISTORY: Past Medical History:  Diagnosis  Date   Arthritis    Coronary artery disease    Hyperlipidemia    SCC (squamous cell carcinoma) 05/28/2004   top of left hand curet x3 29f   SCC (squamous cell carcinoma) 09/13/2013   mid back tx cx3 536f  SCC (squamous cell carcinoma) 09/06/2018   left wrist tx with bx   Squamous cell carcinoma of skin 11/16/2003   left ear tip tx curet and cautery 3f58f  ALLERGIES:  has No Known Allergies.  MEDICATIONS:  Current Outpatient Medications  Medication Sig Dispense Refill   aspirin EC 81 MG tablet Take 81 mg by mouth daily.     atorvastatin (LIPITOR) 80 MG tablet Take 1 tablet (80 mg total) by mouth daily. 90 tablet 3   Budeson-Glycopyrrol-Formoterol (BREZTRI AEROSPHERE) 160-9-4.8 MCG/ACT AERO Inhale 2 puffs into the lungs in the morning and at bedtime. 1 each 5   Calcium Carb-Cholecalciferol (CALCIUM 600 + D PO) Take 1,200 mg by mouth daily.     Cholecalciferol (DIALYVITE VITAMIN D 5000) 125 MCG (5000 UT) capsule Take 5,000 Units by mouth daily.     metoprolol tartrate (LOPRESSOR) 25 MG tablet Take 0.5 tablets (12.5 mg total) by mouth 2 (two) times daily. 180 tablet 3   nitroGLYCERIN (NITROSTAT) 0.4 MG SL tablet Place 0.4 mg under the tongue every 5 (five) minutes as needed for chest pain.     No current facility-administered medications for this visit.    SURGICAL HISTORY:  Past Surgical History:  Procedure Laterality Date  BRONCHIAL BIOPSY  07/22/2021   Procedure: BRONCHIAL BIOPSIES;  Surgeon: Collene Gobble, MD;  Location: [place];  Service: Cardiopulmonary;;   BRONCHIAL BRUSHINGS  07/22/2021   Procedure: BRONCHIAL BRUSHINGS;  Surgeon: Collene Gobble, MD;  Location: [place];  Service: Cardiopulmonary;;   BRONCHIAL NEEDLE ASPIRATION BIOPSY  07/22/2021   Procedure: BRONCHIAL NEEDLE ASPIRATION BIOPSIES;  Surgeon: Collene Gobble, MD;  Location: [place];  Service: Cardiopulmonary;;   CHOLECYSTECTOMY N/A 02/15/2022   Procedure: LAPAROSCOPIC CHOLECYSTECTOMY;  Surgeon:  Virl Cagey, MD;  Location: [facility];  Service: General;  Laterality: N/A;   CORONARY ARTERY BYPASS GRAFT N/A 05/13/2021   Procedure: CORONARY ARTERY BYPASS GRAFTING (CABG) TIMES 3  , ON PUMP, USING LEFT INTERNAL MAMMARY ARTERY AND RIGHT GREATER SAPHENOUS VEIN HARVESTED ENDOSCOPICALLY;  Surgeon: Lajuana Matte, MD;  Location: [place];  Service: Open Heart Surgery;  Laterality: N/A;   ENDOVEIN HARVEST OF GREATER SAPHENOUS VEIN Right 05/13/2021   Procedure: ENDOVEIN HARVEST OF GREATER SAPHENOUS VEIN;  Surgeon: Lajuana Matte, MD;  Location: [place];  Service: Open Heart Surgery;  Laterality: Right;   epidural     back pain   IR CT SPINE LTD  06/18/2020   IR KYPHO LUMBAR INC FX REDUCE BONE BX UNI/BIL CANNULATION INC/IMAGING  06/18/2020   LEFT HEART CATH AND CORONARY ANGIOGRAPHY N/A 05/06/2021   Procedure: LEFT HEART CATH AND CORONARY ANGIOGRAPHY;  Surgeon: [name], MD;  Location: [place] [facility];  Service: Cardiovascular;  Laterality: N/A;   LUNG BIOPSY Right 05/13/2021   Procedure: RIGHT LUNG BIOPSY;  Surgeon: Lajuana Matte, MD;  Location: [place];  Service: Open Heart Surgery;  Laterality: Right;   MINOR HEMORRHOIDECTOMY     SHOULDER SURGERY     TEE WITHOUT CARDIOVERSION N/A 05/13/2021   Procedure: TRANSESOPHAGEAL ECHOCARDIOGRAM (TEE);  Surgeon: Lajuana Matte, MD;  Location: [place];  Service: Open Heart Surgery;  Laterality: N/A;   TRANSURETHRAL RESECTION OF BLADDER TUMOR  2014   TRANSURETHRAL RESECTION OF BLADDER TUMOR N/A 05/19/2018   Procedure: TRANSURETHRAL RESECTION OF BLADDER TUMOR (TURBT) AND (INTRAVESICAL GEMCITABINE INSTILLED IN BLADDER IN PACU) ;  Surgeon: Franchot Gallo, MD;  Location: [facility];  Service: Urology;  Laterality: N/A;  [place] Bilateral 07/22/2021   Procedure: VIDEO BRONCHOSCOPY WITH ENDOBRONCHIAL ULTRASOUND;  Surgeon: Collene Gobble, MD;  Location: [facility];  Service: Cardiopulmonary;   Laterality: Bilateral;    REVIEW OF SYSTEMS:  A comprehensive review of systems was negative.   PHYSICAL EXAMINATION: General appearance: alert, cooperative, and no distress Head: Normocephalic, without obvious abnormality, atraumatic Neck: no adenopathy, no JVD, supple, symmetrical, trachea midline, and thyroid not enlarged, symmetric, no tenderness/mass/nodules Lymph nodes: Cervical, supraclavicular, and axillary nodes normal. Resp: clear to auscultation bilaterally Back: symmetric, no curvature. ROM normal. No CVA tenderness. Cardio: regular rate and rhythm, S1, S2 normal, no murmur, click, rub or gallop GI: soft, non-tender; bowel sounds normal; no masses,  no organomegaly Extremities: extremities normal, atraumatic, no cyanosis or edema  ECOG PERFORMANCE STATUS: 0 - Asymptomatic  Blood pressure (!) 127/59, pulse 70, temperature 97.7 F (36.5 C), temperature source Oral, resp. rate 16, weight 168 lb 4.8 oz (76.3 kg), SpO2 98 %.  LABORATORY DATA: Lab Results  Component Value Date   WBC 5.8 04/14/2022   HGB 11.9 (L) 04/14/2022   HCT 35.7 (L) 04/14/2022   MCV 91.8 04/14/2022   PLT 165 04/14/2022      Chemistry  Component Value Date/Time   NA 138 04/14/2022 0848   NA 139 05/01/2021 1146   K 3.9 04/14/2022 0848   CL 105 04/14/2022 0848   CO2 26 04/14/2022 0848   BUN 16 04/14/2022 0848   BUN 20 05/01/2021 1146   CREATININE 1.24 04/14/2022 0848      Component Value Date/Time   CALCIUM 9.2 04/14/2022 0848   ALKPHOS 79 04/14/2022 0848   AST 12 (L) 04/14/2022 0848   ALT 10 04/14/2022 0848   BILITOT 1.0 04/14/2022 0848       RADIOGRAPHIC STUDIES: No results found.  ASSESSMENT AND PLAN: This is a very pleasant 82 years old white male diagnosed with stage IIIC (T3, N3, M0) non-small cell lung cancer, adenocarcinoma with no actionable mutations and PD-L1 expression of 0% diagnosed in January 2023 status post wedge resection of the right upper lobe lung mass in a  setting of CABG under the care of Dr. Kipp Brood but the PET scan showed residual disease in the right upper lobe in addition to hypermetabolic right hilar, subcarinal and left infrahilar lymph nodes. The patient was seen by Dr. Lamonte Sakai and biopsy of the right upper lobe as well as the subcarinal and left hilar lymph nodes were positive for malignancy consistent with non-small cell lung cancer, adenocarcinoma. He underwent a course of concurrent chemoradiation with weekly carboplatin for AUC of 2 and paclitaxel 45 Mg/M2 status post 6 cycles.  The patient tolerated this previous course of concurrent chemoradiation fairly well except for fatigue and mild odynophagia. The patient is currently undergoing consolidation treatment with immunotherapy with Imfinzi 1500 Mg IV every 4 weeks status post 5 cycles.    The patient has been tolerating this treatment well with no concerning adverse effects. I recommended for him to proceed with cycle #6 today as planned. I will see him back for follow-up visit in 4 weeks for evaluation with repeat CT scan of the chest for restaging of his disease. The patient was advised to call immediately if he has any other concerning symptoms in the interval. The patient voices understanding of current disease status and treatment options and is in agreement with the current care plan. All questions were answered. The patient knows to call the clinic with any problems, questions or concerns. We can certainly see the patient much sooner if necessary. The total time spent in the appointment was 20 minutes.   Disclaimer: This note was dictated with voice recognition software. Similar sounding words can inadvertently be transcribed and may not be corrected upon review.

## 2022-04-14 NOTE — Telephone Encounter (Signed)
Nebulizer and medicine for Albuterol 2.5 MG/ML resent. Nothing further needed.

## 2022-04-15 LAB — T4: T4, Total: 5.5 ug/dL (ref 4.5–12.0)

## 2022-04-16 ENCOUNTER — Other Ambulatory Visit: Payer: Self-pay

## 2022-04-16 DIAGNOSIS — J439 Emphysema, unspecified: Secondary | ICD-10-CM | POA: Diagnosis not present

## 2022-04-16 DIAGNOSIS — I129 Hypertensive chronic kidney disease with stage 1 through stage 4 chronic kidney disease, or unspecified chronic kidney disease: Secondary | ICD-10-CM | POA: Diagnosis not present

## 2022-04-16 DIAGNOSIS — Z825 Family history of asthma and other chronic lower respiratory diseases: Secondary | ICD-10-CM | POA: Diagnosis not present

## 2022-04-16 DIAGNOSIS — Z809 Family history of malignant neoplasm, unspecified: Secondary | ICD-10-CM | POA: Diagnosis not present

## 2022-04-16 DIAGNOSIS — E785 Hyperlipidemia, unspecified: Secondary | ICD-10-CM | POA: Diagnosis not present

## 2022-04-16 DIAGNOSIS — Z811 Family history of alcohol abuse and dependence: Secondary | ICD-10-CM | POA: Diagnosis not present

## 2022-04-16 DIAGNOSIS — J449 Chronic obstructive pulmonary disease, unspecified: Secondary | ICD-10-CM

## 2022-04-16 DIAGNOSIS — M1990 Unspecified osteoarthritis, unspecified site: Secondary | ICD-10-CM | POA: Diagnosis not present

## 2022-04-16 DIAGNOSIS — Z85828 Personal history of other malignant neoplasm of skin: Secondary | ICD-10-CM | POA: Diagnosis not present

## 2022-04-16 DIAGNOSIS — Z8249 Family history of ischemic heart disease and other diseases of the circulatory system: Secondary | ICD-10-CM | POA: Diagnosis not present

## 2022-04-16 DIAGNOSIS — Z87891 Personal history of nicotine dependence: Secondary | ICD-10-CM | POA: Diagnosis not present

## 2022-04-16 DIAGNOSIS — N529 Male erectile dysfunction, unspecified: Secondary | ICD-10-CM | POA: Diagnosis not present

## 2022-04-16 DIAGNOSIS — N182 Chronic kidney disease, stage 2 (mild): Secondary | ICD-10-CM | POA: Diagnosis not present

## 2022-04-16 MED ORDER — ALBUTEROL SULFATE (2.5 MG/3ML) 0.083% IN NEBU: 2.5000 mg | INHALATION_SOLUTION | Freq: Four times a day (QID) | RESPIRATORY_TRACT | 11 refills | Status: DC | PRN

## 2022-04-30 ENCOUNTER — Telehealth: Payer: Self-pay | Admitting: Pulmonary Disease

## 2022-04-30 NOTE — Telephone Encounter (Signed)
Called and spoke with pt letting him know recs per Florentina Addison and he verbalized understanding. Appt scheduled for pt. Nothing further needed.

## 2022-04-30 NOTE — Telephone Encounter (Signed)
Called and spoke with pt who states he has had problems with a cough. States that he has been using his Breztri inhaler.   Pt said that he has a hacky cough that he is not coughing up any phlegm. Pt has been taking mucinex DM but still is not able to cough up any phlegm.  Pt denies any complaints of fever or wheezing. States his cough has been going on for months now but is getting worse. States that he can cough at any time now.   Pt wants to know if anything could be prescribed to help with his cough.

## 2022-04-30 NOTE — Telephone Encounter (Signed)
Patient would like the nurse to call him regarding his nebulizer.  He stated he has a cough and would like some advice on his nebulizer.

## 2022-04-30 NOTE — Telephone Encounter (Signed)
Also routing to [facility] as she was provider of day in case Dr. Tonia Brooms is not available now as he is not in office tomorrow 1/19.

## 2022-04-30 NOTE — Telephone Encounter (Signed)
Needs OV for further evaluation if cough has been ongoing that long. Thanks.

## 2022-05-03 ENCOUNTER — Encounter (HOSPITAL_COMMUNITY): Payer: Self-pay

## 2022-05-03 ENCOUNTER — Emergency Department (HOSPITAL_COMMUNITY)
Admission: EM | Admit: 2022-05-03 | Discharge: 2022-05-03 | Disposition: A | Discharge status: Home / Self Care | Payer: Medicare HMO | Attending: Emergency Medicine | Admitting: Emergency Medicine

## 2022-05-03 ENCOUNTER — Other Ambulatory Visit: Payer: Self-pay

## 2022-05-03 ENCOUNTER — Emergency Department (HOSPITAL_COMMUNITY): Payer: Medicare HMO

## 2022-05-03 DIAGNOSIS — Z7951 Long term (current) use of inhaled steroids: Secondary | ICD-10-CM | POA: Diagnosis not present

## 2022-05-03 DIAGNOSIS — R052 Subacute cough: Secondary | ICD-10-CM | POA: Diagnosis not present

## 2022-05-03 DIAGNOSIS — I2510 Atherosclerotic heart disease of native coronary artery without angina pectoris: Secondary | ICD-10-CM | POA: Diagnosis not present

## 2022-05-03 DIAGNOSIS — R059 Cough, unspecified: Secondary | ICD-10-CM | POA: Diagnosis not present

## 2022-05-03 DIAGNOSIS — Z1152 Encounter for screening for COVID-19: Secondary | ICD-10-CM | POA: Diagnosis not present

## 2022-05-03 DIAGNOSIS — Z85118 Personal history of other malignant neoplasm of bronchus and lung: Secondary | ICD-10-CM | POA: Diagnosis not present

## 2022-05-03 DIAGNOSIS — Z7982 Long term (current) use of aspirin: Secondary | ICD-10-CM | POA: Insufficient documentation

## 2022-05-03 DIAGNOSIS — J439 Emphysema, unspecified: Secondary | ICD-10-CM | POA: Diagnosis not present

## 2022-05-03 DIAGNOSIS — I10 Essential (primary) hypertension: Secondary | ICD-10-CM | POA: Diagnosis not present

## 2022-05-03 DIAGNOSIS — J449 Chronic obstructive pulmonary disease, unspecified: Secondary | ICD-10-CM | POA: Insufficient documentation

## 2022-05-03 DIAGNOSIS — J811 Chronic pulmonary edema: Secondary | ICD-10-CM | POA: Diagnosis not present

## 2022-05-03 LAB — COMPREHENSIVE METABOLIC PANEL
ALT: 13 U/L (ref 0–44)
AST: 16 U/L (ref 15–41)
Albumin: 3.3 g/dL — ABNORMAL LOW (ref 3.5–5.0)
Alkaline Phosphatase: 81 U/L (ref 38–126)
Anion gap: 8 (ref 5–15)
BUN: 14 mg/dL (ref 8–23)
CO2: 23 mmol/L (ref 22–32)
Calcium: 8.4 mg/dL — ABNORMAL LOW (ref 8.9–10.3)
Chloride: 104 mmol/L (ref 98–111)
Creatinine, Ser: 1.11 mg/dL (ref 0.61–1.24)
GFR, Estimated: >60 mL/min (ref >60)
Glucose, Bld: 97 mg/dL (ref 70–99)
Potassium: 3.7 mmol/L (ref 3.5–5.1)
Sodium: 135 mmol/L (ref 135–145)
Total Bilirubin: 0.6 mg/dL (ref 0.3–1.2)
Total Protein: 6.3 g/dL — ABNORMAL LOW (ref 6.5–8.1)

## 2022-05-03 LAB — RESP PANEL BY RT-PCR (RSV, FLU A&B, COVID)  RVPGX2
Influenza A by PCR: NEGATIVE
Influenza B by PCR: NEGATIVE
Resp Syncytial Virus by PCR: POSITIVE — AB
SARS Coronavirus 2 by RT PCR: NEGATIVE

## 2022-05-03 LAB — CBC WITH DIFFERENTIAL/PLATELET
Abs Immature Granulocytes: 0.01 10*3/uL (ref 0.00–0.07)
Basophils Absolute: 0 10*3/uL (ref 0.0–0.1)
Basophils Relative: 1 %
Eosinophils Absolute: 0.2 10*3/uL (ref 0.0–0.5)
Eosinophils Relative: 5 %
HCT: 35.6 % — ABNORMAL LOW (ref 39.0–52.0)
Hemoglobin: 11.4 g/dL — ABNORMAL LOW (ref 13.0–17.0)
Immature Granulocytes: 0 %
Lymphocytes Relative: 13 %
Lymphs Abs: 0.5 10*3/uL — ABNORMAL LOW (ref 0.7–4.0)
MCH: 30.1 pg (ref 26.0–34.0)
MCHC: 32 g/dL (ref 30.0–36.0)
MCV: 93.9 fL (ref 80.0–100.0)
Monocytes Absolute: 0.5 10*3/uL (ref 0.1–1.0)
Monocytes Relative: 13 %
Neutro Abs: 2.8 10*3/uL (ref 1.7–7.7)
Neutrophils Relative %: 68 %
Platelets: 141 10*3/uL — ABNORMAL LOW (ref 150–400)
RBC: 3.79 MIL/uL — ABNORMAL LOW (ref 4.22–5.81)
RDW: 15.1 % (ref 11.5–15.5)
WBC: 4.1 10*3/uL (ref 4.0–10.5)
nRBC: 0 % (ref 0.0–0.2)

## 2022-05-03 LAB — BRAIN NATRIURETIC PEPTIDE: B Natriuretic Peptide: 559 pg/mL — ABNORMAL HIGH (ref 0.0–100.0)

## 2022-05-03 LAB — MAGNESIUM: Magnesium: 1.9 mg/dL (ref 1.7–2.4)

## 2022-05-03 MED ORDER — HYDROCODONE BIT-HOMATROP MBR 5-1.5 MG/5ML PO SOLN: 5.0000 mL | Freq: Four times a day (QID) | ORAL | 0 refills | Status: DC | PRN

## 2022-05-03 MED ORDER — FUROSEMIDE 40 MG PO TABS
40.0000 mg | ORAL_TABLET | Freq: Once | ORAL | Status: AC
  Administered 2022-05-03: 40 mg via ORAL
  Filled 2022-05-03: qty 1

## 2022-05-03 MED ORDER — FUROSEMIDE 20 MG PO TABS: 20.0000 mg | ORAL_TABLET | Freq: Every day | ORAL | 0 refills | Status: DC

## 2022-05-03 MED ORDER — PREDNISONE 50 MG PO TABS
60.0000 mg | ORAL_TABLET | Freq: Every day | ORAL | Status: DC
  Administered 2022-05-03: 60 mg via ORAL
  Filled 2022-05-03: qty 1

## 2022-05-03 MED ORDER — PREDNISONE 10 MG PO TABS: 20.0000 mg | ORAL_TABLET | Freq: Every day | ORAL | 0 refills | Status: AC

## 2022-05-03 MED ORDER — POTASSIUM CHLORIDE CRYS ER 20 MEQ PO TBCR
40.0000 meq | EXTENDED_RELEASE_TABLET | Freq: Once | ORAL | Status: AC
  Administered 2022-05-03: 40 meq via ORAL
  Filled 2022-05-03: qty 2

## 2022-05-03 NOTE — Discharge Instructions (Signed)
There were medications that were sent to your pharmacy: -Prednisone is a steroid that can minimize airway inflammation.  Take this as prescribed for the next 4 days starting tomorrow.  You should additionally use your nebulized breathing treatments at home to help open up airways and minimize cough. -Lasix is a medication to minimize fluid that backs up into the lungs from the heart.  This will cause you to have increased urine for 6 hours after you take it.  You did receive a dose of this in the emergency department.  Take this as prescribed for the next 7 days.  Talk to primary care doctor about possible long-term use. -Hycodan is a strong cough syrup.  Take this only as needed.  Ensure that you go to your scheduled CT scan tomorrow.  Continue to follow-up with your outpatient providers.  Return to the emergency department for any new or worsening symptoms of concern.

## 2022-05-03 NOTE — ED Triage Notes (Signed)
Pt reports having problems with a cough and emphysema since October and has another appointment with pulmonology this coming Friday but reports cough is keeping him awake at night now.

## 2022-05-03 NOTE — ED Provider Notes (Signed)
[place] EMERGENCY DEPARTMENT AT [facility] Provider Note   CSN: [id number] Arrival date & time: 05/03/22  1419     History  Chief Complaint  Patient presents with   Cough    Kevin [name] is a 82 y.o. male.   Cough Patient presents for cough.  Medical history includes HLD, CAD, right lung adenocarcinoma s/p wedge resection 1 year ago, arthritis.  He is currently on immunotherapy every 4 weeks, for his cancer.  Since October, patient has had a persistent dry cough.  He has tried over-the-counter cough drops with short-term relief.  He has tried over-the-counter cough syrup, also with short-term relief.  Cough is worsened over the past 2 weeks.  It has caused him sleep disturbance.  For this reason, he presents to the ED.  Patient denies any recent shortness of breath, fevers, or chills.  He does have a history of COPD and has been prescribed nebulizers in the past.  He states that these do not seem to help him at all.  When using them to treat cough, causes worsened coughing. He has an outpatient CT scan and hide his chest scheduled for tomorrow for ongoing surveillance of his cancer.    Home Medications Prior to Admission medications   Medication Sig Start Date End Date Taking? Authorizing Provider  acetaminophen (TYLENOL) 500 MG tablet Take 500 mg by mouth daily as needed for moderate pain, fever or headache.   Yes [provider]  albuterol (PROVENTIL) (2.5 MG/3ML) 0.083% nebulizer solution Take 3 mLs (2.5 mg total) by nebulization every 6 (six) hours as needed for wheezing or shortness of breath. 04/16/22  Yes Icard, Rachel Bo, DO  aspirin EC 81 MG tablet Take 81 mg by mouth daily.   Yes [provider]  atorvastatin (LIPITOR) 80 MG tablet Take 1 tablet (80 mg total) by mouth daily. 07/09/21  Yes Yates Decamp, MD  Budeson-Glycopyrrol-Formoterol (BREZTRI AEROSPHERE) 160-9-4.8 MCG/ACT AERO Inhale 2 puffs into the lungs in the morning and at bedtime. 04/03/22   Yes Icard, Rachel Bo, DO  Cholecalciferol (DIALYVITE VITAMIN D 5000) 125 MCG (5000 UT) capsule Take 5,000 Units by mouth daily.   Yes [provider]  furosemide (LASIX) 20 MG tablet Take 1 tablet (20 mg total) by mouth daily for 7 days. 05/03/22 05/10/22 Yes Gloris Manchester, MD  HYDROcodone bit-homatropine (HYCODAN) 5-1.5 MG/5ML syrup Take 5 mLs by mouth every 6 (six) hours as needed for cough. 05/03/22  Yes Gloris Manchester, MD  metoprolol tartrate (LOPRESSOR) 25 MG tablet Take 0.5 tablets (12.5 mg total) by mouth 2 (two) times daily. Patient taking differently: Take 12.5 mg by mouth 2 (two) times daily with breakfast and lunch. 10/01/21  Yes Cantwell, Celeste C, PA-C  nitroGLYCERIN (NITROSTAT) 0.4 MG SL tablet Place 0.4 mg under the tongue every 5 (five) minutes as needed for chest pain. 04/09/21  Yes [provider]  predniSONE (DELTASONE) 10 MG tablet Take 2 tablets (20 mg total) by mouth daily for 4 days. 05/03/22 05/07/22 Yes Gloris Manchester, MD      Allergies    Patient has no known allergies.    Review of Systems   Review of Systems  Respiratory:  Positive for cough.   All other systems reviewed and are negative.   Physical Exam Updated Vital Signs BP 135/77 (BP Location: Right Arm)   Pulse 85   Temp 98.9 F (37.2 C)   Resp 18   Ht 5\' 10"  (1.778 m)   Wt 73.5 kg  SpO2 99%   BMI 23.24 kg/m  Physical Exam Vitals and nursing note reviewed.  Constitutional:      General: He is not in acute distress.    Appearance: Normal appearance. He is well-developed. He is not ill-appearing, toxic-appearing or diaphoretic.  HENT:     Head: Normocephalic and atraumatic.     Right Ear: External ear normal.     Left Ear: External ear normal.     Nose: Nose normal.     Mouth/Throat:     Mouth: Mucous membranes are moist.  Eyes:     Extraocular Movements: Extraocular movements intact.     Conjunctiva/sclera: Conjunctivae normal.  Cardiovascular:     Rate and Rhythm: Normal rate and  regular rhythm.     Heart sounds: No murmur heard. Pulmonary:     Effort: Pulmonary effort is normal. No tachypnea, prolonged expiration or respiratory distress.     Breath sounds: Normal breath sounds. No decreased breath sounds, wheezing, rhonchi or rales.  Abdominal:     General: There is no distension.     Palpations: Abdomen is soft.     Tenderness: There is no abdominal tenderness.  Musculoskeletal:        General: No swelling. Normal range of motion.     Cervical back: Normal range of motion and neck supple.     Right lower leg: No edema.     Left lower leg: No edema.  Skin:    General: Skin is warm and dry.     Capillary Refill: Capillary refill takes less than 2 seconds.     Coloration: Skin is not jaundiced or pale.  Neurological:     General: No focal deficit present.     Mental Status: He is alert and oriented to person, place, and time.     Cranial Nerves: No cranial nerve deficit.     Sensory: No sensory deficit.     Motor: No weakness.     Coordination: Coordination normal.  Psychiatric:        Mood and Affect: Mood normal.        Behavior: Behavior normal.        Thought Content: Thought content normal.        Judgment: Judgment normal.     ED Results / Procedures / Treatments   Labs (all labs ordered are listed, but only abnormal results are displayed) Labs Reviewed  RESP PANEL BY RT-PCR (RSV, FLU A&B, COVID)  RVPGX2 - Abnormal; Notable for the following components:      Result Value   Resp Syncytial Virus by PCR POSITIVE (*)    All other components within normal limits  COMPREHENSIVE METABOLIC PANEL - Abnormal; Notable for the following components:   Calcium 8.4 (*)    Total Protein 6.3 (*)    Albumin 3.3 (*)    All other components within normal limits  CBC WITH DIFFERENTIAL/PLATELET - Abnormal; Notable for the following components:   RBC 3.79 (*)    Hemoglobin 11.4 (*)    HCT 35.6 (*)    Platelets 141 (*)    Lymphs Abs 0.5 (*)    All other  components within normal limits  BRAIN NATRIURETIC PEPTIDE - Abnormal; Notable for the following components:   B Natriuretic Peptide 559.0 (*)    All other components within normal limits  MAGNESIUM    EKG EKG Interpretation  Date/Time:  Sunday May 03 2022 16:02:50 EST Ventricular Rate:  85 PR Interval:  207 QRS Duration: 83 QT  Interval:  346 QTC Calculation: 412 R Axis:   75 Text Interpretation: Sinus rhythm Premature atrial complexes Confirmed by Gloris Manchester [phone]) on 05/03/2022 4:27:03 PM  Radiology DG Chest Port 1 View  Result Date: 05/03/2022 CLINICAL DATA:  Cough EXAM: PORTABLE CHEST 1 VIEW COMPARISON:  Chest x-ray 02/14/2022.  Chest CT 01/15/2022 FINDINGS: Postsurgical changes, scarring and pleural thickening in the right upper lobe appear unchanged. The heart is enlarged. There central pulmonary vascular congestion. There is no evidence for pneumothorax or pleural effusion. Emphysematous changes are again noted. Sternotomy wires are present. No acute fractures are seen. IMPRESSION: 1. Cardiomegaly with central pulmonary vascular congestion. 2. Stable postsurgical changes, scarring and pleural thickening in the right upper lobe. Electronically Signed   By: Darliss Cheney M.D.   On: 05/03/2022 16:08    Procedures Procedures    Medications Ordered in ED Medications  predniSONE (DELTASONE) tablet 60 mg (has no administration in time range)  potassium chloride SA (KLOR-CON M) CR tablet 40 mEq (has no administration in time range)  furosemide (LASIX) tablet 40 mg (has no administration in time range)    ED Course/ Medical Decision Making/ A&P                             Medical Decision Making Amount and/or Complexity of Data Reviewed Labs: ordered. Radiology: ordered.   This patient presents to the ED for concern of cough, this involves an extensive number of treatment options, and is a complaint that carries with it a high risk of complications and morbidity.  The  differential diagnosis includes pneumonia, URI, COPD exacerbation, CHF   Co morbidities that complicate the patient evaluation  HLD, CAD, right lung adenocarcinoma s/p wedge resection 1 year ago, arthritis   Additional history obtained:  Additional history obtained from patient's wife External records from outside source obtained and reviewed including EMR   Lab Tests:  I Ordered, and personally interpreted labs.  The pertinent results include: Baseline anemia, no leukocytosis, normal electrolytes.  BNP is elevated at 559 without prior lab work for comparison.  Patient is found to be RSV positive.   Imaging Studies ordered:  I ordered imaging studies including chest x-ray I independently visualized and interpreted imaging which showed cardiomegaly with central vascular congestion I agree with the radiologist interpretation   Cardiac Monitoring: / EKG:  The patient was maintained on a cardiac monitor.  I personally viewed and interpreted the cardiac monitored which showed an underlying rhythm of: Sinus rhythm  Problem List / ED Course / Critical interventions / Medication management  This patient with history of lung cancer and COPD, presenting for persistent cough over the past 3 months that has worsened over the past 2 weeks and is causing sleep disturbance presents to the ED.  He denies any other associated symptoms.  He is well-appearing on exam.  His breathing is unlabored.  SpO2 is 100% on room air.  Lungs are clear to auscultation.  Diagnostic workup was initiated.  On patient's lab work, he was found to be RSV positive.  In the setting of underlying COPD, this is likely causing a worsening of his cough.  His BNP was also elevated at 559.  Per chart review, he underwent echocardiogram 1 year ago.  At that time, he had global hypokinesis with LVEF of 40 to 45%.  Today on chest x-ray, he did have some pulmonary vascular congestion.  Currently, he is not prescribed any diuretic  medication.  Patient was given prednisone for treatment of bronchitis related to his COPD and URI.  He was prescribed additional steroid therapy.  A potassium supplement was provided in the ED to optimize his electrolyte levels.  Patient was also given Lasix for diuresis.  He does state that his cough is worse when laying flat and this may be secondary to his underlying diminished heart function.  He was prescribed additional Lasix for home.  He was advised to follow-up with his outpatient providers.  Tomorrow, he does have a CT scan scheduled for ongoing surveillance of his cancer.  Patient was discharged in stable condition. I ordered medication including prednisone for bronchitis; potassium chloride for electrolyte optimization; Lasix for diuresis Reevaluation of the patient after these medicines showed that the patient stayed the same I have reviewed the patients home medicines and have made adjustments as needed   Social Determinants of Health:  Has access to outpatient care        Final Clinical Impression(s) / ED Diagnoses Final diagnoses:  Subacute cough    Rx / DC Orders ED Discharge Orders          Ordered    predniSONE (DELTASONE) 10 MG tablet  Daily        05/03/22 1858    furosemide (LASIX) 20 MG tablet  Daily        05/03/22 1858    HYDROcodone bit-homatropine (HYCODAN) 5-1.5 MG/5ML syrup  Every 6 hours PRN        05/03/22 1858              Gloris Manchester, MD 05/03/22 1859

## 2022-05-04 ENCOUNTER — Ambulatory Visit (HOSPITAL_COMMUNITY)
Admission: RE | Admit: 2022-05-04 | Discharge: 2022-05-04 | Disposition: A | Discharge status: Home / Self Care | Payer: Medicare HMO | Source: Ambulatory Visit | Attending: Internal Medicine | Admitting: Internal Medicine

## 2022-05-04 DIAGNOSIS — J181 Lobar pneumonia, unspecified organism: Secondary | ICD-10-CM | POA: Diagnosis not present

## 2022-05-04 DIAGNOSIS — J432 Centrilobular emphysema: Secondary | ICD-10-CM | POA: Diagnosis not present

## 2022-05-04 DIAGNOSIS — C3490 Malignant neoplasm of unspecified part of unspecified bronchus or lung: Secondary | ICD-10-CM | POA: Diagnosis not present

## 2022-05-04 DIAGNOSIS — C3411 Malignant neoplasm of upper lobe, right bronchus or lung: Secondary | ICD-10-CM | POA: Diagnosis not present

## 2022-05-04 MED ORDER — IOHEXOL 300 MG/ML  SOLN
75.0000 mL | Freq: Once | INTRAMUSCULAR | Status: AC | PRN
  Administered 2022-05-04: 75 mL via INTRAVENOUS

## 2022-05-04 MED ORDER — SODIUM CHLORIDE (PF) 0.9 % IJ SOLN
INTRAMUSCULAR | Status: AC
  Filled 2022-05-04: qty 50

## 2022-05-04 NOTE — Progress Notes (Incomplete)
History of Present Illness:   Here for bladder cancer surveillance.     4.24.2017: TURBT of a 2 cm low-grade nonmuscle invasive bladder tumor by Dr. Nechama Guard.  Repeat resection for recurrence in August 2017.  Mitomycin was placed after each procedure.   2.6.2020: Repeat TURBT/gemcitabine for 15 mm right posterior bladder wall lesion, low-grade nonmuscle invasive on pathology review.   5.19.2019:he underwent UroLift for BPH symptoms.  6 implants were placed.   10.25.2022: Now back on tamsulosin because of nocturia x3-4.  No gross hematuria.  Tamsulosin has improved his nighttime voiding.  He does drink coffee in the evening.   5.23.2023: Here for questions.  He was recently diagnosed with severe CAD, underwent urgent bypass by Dr. Cliffton Asters.  Part of the evaluation included preoperative chest CT which revealed probable carcinoma of the right upper lobe.  He had a resection at the same time.  He has stage III adenocarcinoma.  He is getting chemo/radiotherapy now.  ` He is not currently on tamsulosin, voiding fairly well.  PET CT scan also revealed bladder calculi.   1.23.2024:   Past Medical History:  Diagnosis Date   Arthritis    Coronary artery disease    Hyperlipidemia    SCC (squamous cell carcinoma) 05/28/2004   top of left hand curet x3 23fu   SCC (squamous cell carcinoma) 09/13/2013   mid back tx cx3 55fu   SCC (squamous cell carcinoma) 09/06/2018   left wrist tx with bx   Squamous cell carcinoma of skin 11/16/2003   left ear tip tx curet and cautery 57fu    Past Surgical History:  Procedure Laterality Date   BRONCHIAL BIOPSY  07/22/2021   Procedure: BRONCHIAL BIOPSIES;  Surgeon: Leslye Peer, MD;  Location: [facility] ENDOSCOPY;  Service: Cardiopulmonary;;   BRONCHIAL BRUSHINGS  07/22/2021   Procedure: BRONCHIAL BRUSHINGS;  Surgeon: Leslye Peer, MD;  Location: [facility] ENDOSCOPY;  Service: Cardiopulmonary;;   BRONCHIAL NEEDLE ASPIRATION BIOPSY  07/22/2021   Procedure: BRONCHIAL NEEDLE  ASPIRATION BIOPSIES;  Surgeon: Leslye Peer, MD;  Location: [facility] ENDOSCOPY;  Service: Cardiopulmonary;;   CHOLECYSTECTOMY N/A 02/15/2022   Procedure: LAPAROSCOPIC CHOLECYSTECTOMY;  Surgeon: Lucretia Roers, MD;  Location: [facility] ORS;  Service: General;  Laterality: N/A;   CORONARY ARTERY BYPASS GRAFT N/A 05/13/2021   Procedure: CORONARY ARTERY BYPASS GRAFTING (CABG) TIMES 3  , ON PUMP, USING LEFT INTERNAL MAMMARY ARTERY AND RIGHT GREATER SAPHENOUS VEIN HARVESTED ENDOSCOPICALLY;  Surgeon: Corliss Skains, MD;  Location: [facility] OR;  Service: Open Heart Surgery;  Laterality: N/A;   ENDOVEIN HARVEST OF GREATER SAPHENOUS VEIN Right 05/13/2021   Procedure: ENDOVEIN HARVEST OF GREATER SAPHENOUS VEIN;  Surgeon: Corliss Skains, MD;  Location: [facility] OR;  Service: Open Heart Surgery;  Laterality: Right;   epidural     back pain   IR CT SPINE LTD  06/18/2020   IR KYPHO LUMBAR INC FX REDUCE BONE BX UNI/BIL CANNULATION INC/IMAGING  06/18/2020   LEFT HEART CATH AND CORONARY ANGIOGRAPHY N/A 05/06/2021   Procedure: LEFT HEART CATH AND CORONARY ANGIOGRAPHY;  Surgeon: Yates Decamp, MD;  Location: [facility] INVASIVE CV LAB;  Service: Cardiovascular;  Laterality: N/A;   LUNG BIOPSY Right 05/13/2021   Procedure: RIGHT LUNG BIOPSY;  Surgeon: Corliss Skains, MD;  Location: [facility] OR;  Service: Open Heart Surgery;  Laterality: Right;   MINOR HEMORRHOIDECTOMY     SHOULDER SURGERY     TEE WITHOUT CARDIOVERSION N/A 05/13/2021   Procedure: TRANSESOPHAGEAL ECHOCARDIOGRAM (TEE);  Surgeon: Brynda Greathouse  O, MD;  Location: [facility] OR;  Service: Open Heart Surgery;  Laterality: N/A;   TRANSURETHRAL RESECTION OF BLADDER TUMOR  2014   TRANSURETHRAL RESECTION OF BLADDER TUMOR N/A 05/19/2018   Procedure: TRANSURETHRAL RESECTION OF BLADDER TUMOR (TURBT) AND (INTRAVESICAL GEMCITABINE INSTILLED IN BLADDER IN PACU) ;  Surgeon: Marcine Matar, MD;  Location: [facility] ORS;  Service: Urology;  Laterality: N/A;  30 MINS   VIDEO BRONCHOSCOPY WITH  ENDOBRONCHIAL ULTRASOUND Bilateral 07/22/2021   Procedure: VIDEO BRONCHOSCOPY WITH ENDOBRONCHIAL ULTRASOUND;  Surgeon: Leslye Peer, MD;  Location: [facility] ENDOSCOPY;  Service: Cardiopulmonary;  Laterality: Bilateral;    Home Medications:  Allergies as of 05/05/2022   No Known Allergies      Medication List        Accurate as of May 04, 2022 11:56 AM. If you have any questions, ask your nurse or doctor.          acetaminophen 500 MG tablet Commonly known as: TYLENOL Take 500 mg by mouth daily as needed for moderate pain, fever or headache.   albuterol (2.5 MG/3ML) 0.083% nebulizer solution Commonly known as: PROVENTIL Take 3 mLs (2.5 mg total) by nebulization every 6 (six) hours as needed for wheezing or shortness of breath.   aspirin EC 81 MG tablet Take 81 mg by mouth daily.   atorvastatin 80 MG tablet Commonly known as: LIPITOR Take 1 tablet (80 mg total) by mouth daily.   Breztri Aerosphere 160-9-4.8 MCG/ACT Aero Generic drug: Budeson-Glycopyrrol-Formoterol Inhale 2 puffs into the lungs in the morning and at bedtime.   Dialyvite Vitamin D 5000 125 MCG (5000 UT) capsule Generic drug: Cholecalciferol Take 5,000 Units by mouth daily.   furosemide 20 MG tablet Commonly known as: LASIX Take 1 tablet (20 mg total) by mouth daily for 7 days.   HYDROcodone bit-homatropine 5-1.5 MG/5ML syrup Commonly known as: HYCODAN Take 5 mLs by mouth every 6 (six) hours as needed for cough.   metoprolol tartrate 25 MG tablet Commonly known as: LOPRESSOR Take 0.5 tablets (12.5 mg total) by mouth 2 (two) times daily. What changed: when to take this   nitroGLYCERIN 0.4 MG SL tablet Commonly known as: NITROSTAT Place 0.4 mg under the tongue every 5 (five) minutes as needed for chest pain.   predniSONE 10 MG tablet Commonly known as: DELTASONE Take 2 tablets (20 mg total) by mouth daily for 4 days.        Allergies: No Known Allergies  Family History  Problem Relation  Age of Onset   Alzheimer's disease Mother 67   Congestive Heart Failure Father 41   Heart disease Brother 40   Heart failure Brother 42   Heart failure Brother 77    Social History:  reports that he quit smoking about 27 years ago. His smoking use included cigarettes. He has a 30.00 pack-year smoking history. He has never used smokeless tobacco. He reports that he does not drink alcohol and does not use drugs.  ROS: A complete review of systems was performed.  All systems are negative except for pertinent findings as noted.  Physical Exam:  Vital signs in last 24 hours: There were no vitals taken for this visit. Constitutional:  Alert and oriented, No acute distress Cardiovascular: Regular rate  Respiratory: Normal respiratory effort GI: Abdomen is soft, nontender, nondistended, no abdominal masses. No CVAT.  Genitourinary: Normal male phallus, testes are descended bilaterally and non-tender and without masses, scrotum is normal in appearance without lesions or masses, perineum is normal on inspection.  Lymphatic: No lymphadenopathy Neurologic: Grossly intact, no focal deficits Psychiatric: Normal mood and affect  I have reviewed prior pt notes  I have reviewed notes from referring/previous physicians  I have reviewed urinalysis results  I have independently reviewed prior imaging  I have reviewed prior PSA results  I have reviewed prior urine culture   Impression/Assessment:  ***  Plan:  ***

## 2022-05-05 ENCOUNTER — Ambulatory Visit (INDEPENDENT_AMBULATORY_CARE_PROVIDER_SITE_OTHER): Payer: Medicare HMO | Admitting: Urology

## 2022-05-05 VITALS — BP 113/58 | HR 103

## 2022-05-05 DIAGNOSIS — N210 Calculus in bladder: Secondary | ICD-10-CM | POA: Diagnosis not present

## 2022-05-05 DIAGNOSIS — Z8551 Personal history of malignant neoplasm of bladder: Secondary | ICD-10-CM

## 2022-05-05 DIAGNOSIS — R351 Nocturia: Secondary | ICD-10-CM

## 2022-05-05 DIAGNOSIS — N401 Enlarged prostate with lower urinary tract symptoms: Secondary | ICD-10-CM

## 2022-05-05 DIAGNOSIS — N138 Other obstructive and reflux uropathy: Secondary | ICD-10-CM | POA: Diagnosis not present

## 2022-05-05 LAB — URINALYSIS, ROUTINE W REFLEX MICROSCOPIC
Bilirubin, UA: NEGATIVE
Glucose, UA: NEGATIVE
Ketones, UA: NEGATIVE
Leukocytes,UA: NEGATIVE
Nitrite, UA: NEGATIVE
Specific Gravity, UA: 1.025 (ref 1.005–1.030)
Urobilinogen, Ur: 1 mg/dL (ref 0.2–1.0)
pH, UA: 5 (ref 5.0–7.5)

## 2022-05-05 LAB — MICROSCOPIC EXAMINATION

## 2022-05-05 MED ORDER — CIPROFLOXACIN HCL 500 MG PO TABS
500.0000 mg | ORAL_TABLET | Freq: Once | ORAL | Status: AC
  Administered 2022-05-05: 500 mg via ORAL

## 2022-05-05 NOTE — Progress Notes (Signed)
History of Present Illness:   Here for bladder cancer surveillance.     4.24.2017: TURBT of a 2 cm low-grade nonmuscle invasive bladder tumor by Dr. Nechama [name].  Repeat resection for recurrence in August 2017.  Mitomycin was placed after each procedure.   2.6.2020: Repeat TURBT/gemcitabine for 15 mm right posterior bladder wall lesion, low-grade nonmuscle invasive on pathology review.   5.19.2019:he underwent UroLift for BPH symptoms.  6 implants were placed.   10.25.2022: Now back on tamsulosin because of nocturia x3-4.  No gross hematuria.  Tamsulosin has improved his nighttime voiding.  He does drink coffee in the evening.   5.23.2023: Here for questions.  He was recently diagnosed with severe CAD, underwent urgent bypass by Dr. Cliffton [name].  Part of the evaluation included preoperative chest CT which revealed probable carcinoma of the right upper lobe.  He had a resection at the same time.  He has stage III adenocarcinoma.  He is getting chemo/radiotherapy now.  ` He is not currently on tamsulosin, voiding fairly well.  PET CT scan also revealed bladder calculi.   1.23.2024: He is getting monthly infusions for his lung cancer.  He has had no gross hematuria or significant lower urinary tract symptoms.  He did have a laparoscopic cholecystectomy in November.  Past Medical History:  Diagnosis Date   Arthritis    Coronary artery disease    Hyperlipidemia    SCC (squamous cell carcinoma) 05/28/2004   top of left hand curet x3 26fu   SCC (squamous cell carcinoma) 09/13/2013   mid back tx cx3 72fu   SCC (squamous cell carcinoma) 09/06/2018   left wrist tx with bx   Squamous cell carcinoma of skin 11/16/2003   left ear tip tx curet and cautery 20fu    Past Surgical History:  Procedure Laterality Date   BRONCHIAL BIOPSY  07/22/2021   Procedure: BRONCHIAL BIOPSIES;  Surgeon: Leslye Peer, MD;  Location: [facility] ENDOSCOPY;  Service: Cardiopulmonary;;   BRONCHIAL BRUSHINGS  07/22/2021   Procedure:  BRONCHIAL BRUSHINGS;  Surgeon: Leslye Peer, MD;  Location: [facility] ENDOSCOPY;  Service: Cardiopulmonary;;   BRONCHIAL NEEDLE ASPIRATION BIOPSY  07/22/2021   Procedure: BRONCHIAL NEEDLE ASPIRATION BIOPSIES;  Surgeon: Leslye Peer, MD;  Location: [facility] ENDOSCOPY;  Service: Cardiopulmonary;;   CHOLECYSTECTOMY N/A 02/15/2022   Procedure: LAPAROSCOPIC CHOLECYSTECTOMY;  Surgeon: Lucretia Roers, MD;  Location: [facility] ORS;  Service: General;  Laterality: N/A;   CORONARY ARTERY BYPASS GRAFT N/A 05/13/2021   Procedure: CORONARY ARTERY BYPASS GRAFTING (CABG) TIMES 3  , ON PUMP, USING LEFT INTERNAL MAMMARY ARTERY AND RIGHT GREATER SAPHENOUS VEIN HARVESTED ENDOSCOPICALLY;  Surgeon: Corliss Skains, MD;  Location: [facility] OR;  Service: Open Heart Surgery;  Laterality: N/A;   ENDOVEIN HARVEST OF GREATER SAPHENOUS VEIN Right 05/13/2021   Procedure: ENDOVEIN HARVEST OF GREATER SAPHENOUS VEIN;  Surgeon: Corliss Skains, MD;  Location: [facility] OR;  Service: Open Heart Surgery;  Laterality: Right;   epidural     back pain   IR CT SPINE LTD  06/18/2020   IR KYPHO LUMBAR INC FX REDUCE BONE BX UNI/BIL CANNULATION INC/IMAGING  06/18/2020   LEFT HEART CATH AND CORONARY ANGIOGRAPHY N/A 05/06/2021   Procedure: LEFT HEART CATH AND CORONARY ANGIOGRAPHY;  Surgeon: Yates Decamp, MD;  Location: [facility] INVASIVE CV LAB;  Service: Cardiovascular;  Laterality: N/A;   LUNG BIOPSY Right 05/13/2021   Procedure: RIGHT LUNG BIOPSY;  Surgeon: Corliss Skains, MD;  Location: [facility] OR;  Service: Open Heart Surgery;  Laterality:  Right;   MINOR HEMORRHOIDECTOMY     SHOULDER SURGERY     TEE WITHOUT CARDIOVERSION N/A 05/13/2021   Procedure: TRANSESOPHAGEAL ECHOCARDIOGRAM (TEE);  Surgeon: Corliss Skains, MD;  Location: [facility] OR;  Service: Open Heart Surgery;  Laterality: N/A;   TRANSURETHRAL RESECTION OF BLADDER TUMOR  2014   TRANSURETHRAL RESECTION OF BLADDER TUMOR N/A 05/19/2018   Procedure: TRANSURETHRAL RESECTION OF BLADDER TUMOR (TURBT) AND  (INTRAVESICAL GEMCITABINE INSTILLED IN BLADDER IN PACU) ;  Surgeon: Marcine Matar, MD;  Location: [facility] ORS;  Service: Urology;  Laterality: N/A;  30 MINS   VIDEO BRONCHOSCOPY WITH ENDOBRONCHIAL ULTRASOUND Bilateral 07/22/2021   Procedure: VIDEO BRONCHOSCOPY WITH ENDOBRONCHIAL ULTRASOUND;  Surgeon: Leslye Peer, MD;  Location: [facility] ENDOSCOPY;  Service: Cardiopulmonary;  Laterality: Bilateral;    Home Medications:  Allergies as of 05/05/2022   No Known Allergies      Medication List        Accurate as of May 04, 2022 11:56 AM. If you have any questions, ask your nurse or doctor.          acetaminophen 500 MG tablet Commonly known as: TYLENOL Take 500 mg by mouth daily as needed for moderate pain, fever or headache.   albuterol (2.5 MG/3ML) 0.083% nebulizer solution Commonly known as: PROVENTIL Take 3 mLs (2.5 mg total) by nebulization every 6 (six) hours as needed for wheezing or shortness of breath.   aspirin EC 81 MG tablet Take 81 mg by mouth daily.   atorvastatin 80 MG tablet Commonly known as: LIPITOR Take 1 tablet (80 mg total) by mouth daily.   Breztri Aerosphere 160-9-4.8 MCG/ACT Aero Generic drug: Budeson-Glycopyrrol-Formoterol Inhale 2 puffs into the lungs in the morning and at bedtime.   Dialyvite Vitamin D 5000 125 MCG (5000 UT) capsule Generic drug: Cholecalciferol Take 5,000 Units by mouth daily.   furosemide 20 MG tablet Commonly known as: LASIX Take 1 tablet (20 mg total) by mouth daily for 7 days.   HYDROcodone bit-homatropine 5-1.5 MG/5ML syrup Commonly known as: HYCODAN Take 5 mLs by mouth every 6 (six) hours as needed for cough.   metoprolol tartrate 25 MG tablet Commonly known as: LOPRESSOR Take 0.5 tablets (12.5 mg total) by mouth 2 (two) times daily. What changed: when to take this   nitroGLYCERIN 0.4 MG SL tablet Commonly known as: NITROSTAT Place 0.4 mg under the tongue every 5 (five) minutes as needed for chest pain.    predniSONE 10 MG tablet Commonly known as: DELTASONE Take 2 tablets (20 mg total) by mouth daily for 4 days.        Allergies: No Known Allergies  Family History  Problem Relation Age of Onset   Alzheimer's disease Mother 77   Congestive Heart Failure Father 56   Heart disease Brother 40   Heart failure Brother 71   Heart failure Brother 58    Social History:  reports that he quit smoking about 27 years ago. His smoking use included cigarettes. He has a 30.00 pack-year smoking history. He has never used smokeless tobacco. He reports that he does not drink alcohol and does not use drugs.  ROS: A complete review of systems was performed.  All systems are negative except for pertinent findings as noted.  Physical Exam:  Vital signs in last 24 hours: There were no vitals taken for this visit. Constitutional:  Alert and oriented, No acute distress Cardiovascular: Regular rate  Respiratory: Normal respiratory effort Neurologic: Grossly intact, no focal deficits Psychiatric: Normal mood  and affect  I have reviewed prior pt notes  I have reviewed notes from referring/previous physicians  I have reviewed urinalysis results  I have independently reviewed prior imaging--PET CT scan  I have reviewed prior pathology results  Cystoscopy Procedure Note:  Indication: Bladder cancer surveillance  After informed consent and discussion of the procedure and its risks, Kevin [name] was positioned and prepped in the standard fashion.  Cystoscopy was performed with a flexible cystoscope.   Findings: Urethra: No stricture or lesion Prostate: Minimally obstructive Bladder neck: Open Ureteral orifices: Normal bilaterally Bladder: No lesions seen.  Scar in the posterior bladder without urothelial abnormalities  The patient tolerated the procedure well.      Impression/Assessment:  History of low-grade nonmuscle invasive bladder cancer, most recent resection in 2020.  No  evidence of recurrence based on cystoscopy today  History of bladder calculus-none seen today  Plan:  I will see him back in 6 months for his next cystoscopy

## 2022-05-06 ENCOUNTER — Other Ambulatory Visit: Payer: Self-pay

## 2022-05-07 ENCOUNTER — Telehealth: Payer: Self-pay

## 2022-05-07 DIAGNOSIS — I10 Essential (primary) hypertension: Secondary | ICD-10-CM | POA: Diagnosis not present

## 2022-05-07 DIAGNOSIS — Z299 Encounter for prophylactic measures, unspecified: Secondary | ICD-10-CM | POA: Diagnosis not present

## 2022-05-07 DIAGNOSIS — B338 Other specified viral diseases: Secondary | ICD-10-CM | POA: Diagnosis not present

## 2022-05-07 DIAGNOSIS — C679 Malignant neoplasm of bladder, unspecified: Secondary | ICD-10-CM | POA: Diagnosis not present

## 2022-05-07 DIAGNOSIS — I77810 Thoracic aortic ectasia: Secondary | ICD-10-CM | POA: Diagnosis not present

## 2022-05-07 NOTE — Telephone Encounter (Signed)
        Patient  visited [facility] on 1/21    Telephone encounter attempt :1st    A HIPAA compliant voice message was left requesting a return call.  Instructed patient to call back .    [place], Care Management  [phone] [address], [place], [place] [address] Phone: [phone] Email: [email or web address]

## 2022-05-08 ENCOUNTER — Telehealth: Payer: Self-pay

## 2022-05-08 ENCOUNTER — Encounter: Payer: Self-pay | Admitting: Adult Health

## 2022-05-08 ENCOUNTER — Ambulatory Visit (INDEPENDENT_AMBULATORY_CARE_PROVIDER_SITE_OTHER): Payer: Medicare HMO | Admitting: Adult Health

## 2022-05-08 VITALS — BP 100/80 | HR 102 | Ht 71.0 in | Wt 162.4 lb

## 2022-05-08 DIAGNOSIS — C3491 Malignant neoplasm of unspecified part of right bronchus or lung: Secondary | ICD-10-CM

## 2022-05-08 DIAGNOSIS — R053 Chronic cough: Secondary | ICD-10-CM | POA: Diagnosis not present

## 2022-05-08 MED ORDER — BENZONATATE 200 MG PO CAPS: 200.0000 mg | ORAL_CAPSULE | Freq: Three times a day (TID) | ORAL | 2 refills | Status: DC | PRN

## 2022-05-08 NOTE — Progress Notes (Signed)
@Patient  ID: Kevin [name], male    DOB: 08-17-1940, 82 y.o.   MRN: [id number]  Chief Complaint  Patient presents with   Follow-up    Cough    Referring provider: [name], MD  HPI: 82 yo male former smoker followed for emphysema and stage IIIb non small cell lung cancer status post chemo/radiation and ongoing immunotherapy Medical history significant for coronary disease status post CABG, congestive heart failure  TEST/EVENTS :  07/07/2021 nuclear medicine pet imaging: Hypermetabolic adenopathy within the mediastinum, right upper lobe hypermetabolic lesion. Concerning for advanced age bronchogenic carcinoma. The patient's images have been independently reviewed by me.     CT chest October 2023: Right upper lobe pulmonary nodule adjacent to right upper lobe wedge resection relatively unchanged.  Has some linear areas of groundglass opacity consistent with his radiation changes.  Mild inflammatory changes within the left lower lobe.  No other new nodules,.  No evidence of metastatic disease.  05/08/2022 Follow up : Cough, Emphysema , and Lung cancer  Patient presents for a 1 month follow-up.  Patient was seen last visit with a flare of cough and shortness of breath.  He has underlying emphysema.  Last visit patient was started on Breztri inhaler. Known stage IIIb non-small cell lung cancer status post chemoradiation and ongoing immunotherapy. CT chest May 04, 2022 showed a new 0.6 cm left lower lobe pulmonary nodule indeterminate for metastasis.  Slight decrease in treated right upper lobe pulmonary nodule adjacent to the wedge resection, no thoracic adenopathy.  Moderate emphysema with diffuse bronchial wall thickening.  Patchy bandlike consolidation in the right lower lobe with associated volume loss compatible with evolving postradiation change.,  Patchy parenchymal bands on the left compatible with postradiation change. Since last visit patient says he has not seen any change  in his symptoms.  He is unsure if it helps with his breathing or not.  But has not helped at all with his cough.  Patient says he has been coughing pretty much nonstop for the last 3 months.  Cough so bad he cannot sleep at night.  Patient was seen at urgent care May 03, 2022.  Diagnosed with RSV.  Was recommended to begin prednisone taper.  Patient says he is only taken 1 day off.  Patient says cough is minimally productive.  Has no fever or discolored mucus.    No Known Allergies  Immunization History  Administered Date(s) Administered   Influenza, High Dose Seasonal PF 01/06/2017, 01/17/2018, 01/04/2019    Past Medical History:  Diagnosis Date   Arthritis    Coronary artery disease    Hyperlipidemia    SCC (squamous cell carcinoma) 05/28/2004   top of left hand curet x3 54fu   SCC (squamous cell carcinoma) 09/13/2013   mid back tx cx3 74fu   SCC (squamous cell carcinoma) 09/06/2018   left wrist tx with bx   Squamous cell carcinoma of skin 11/16/2003   left ear tip tx curet and cautery 5fu    Tobacco History: Social History   Tobacco Use  Smoking Status Former   Packs/day: 1.00   Years: 30.00   Total pack years: 30.00   Types: Cigarettes   Quit date: 04/14/1995   Years since quitting: 27.0  Smokeless Tobacco Never   Counseling given: Not Answered   Outpatient Medications Prior to Visit  Medication Sig Dispense Refill   Budeson-Glycopyrrol-Formoterol (BREZTRI AEROSPHERE) 160-9-4.8 MCG/ACT AERO Inhale 2 puffs into the lungs in the morning and at bedtime. 1  each 5   acetaminophen (TYLENOL) 500 MG tablet Take 500 mg by mouth daily as needed for moderate pain, fever or headache.     albuterol (PROVENTIL) (2.5 MG/3ML) 0.083% nebulizer solution Take 3 mLs (2.5 mg total) by nebulization every 6 (six) hours as needed for wheezing or shortness of breath. (Patient not taking: Reported on 05/08/2022) 360 mL 11   aspirin EC 81 MG tablet Take 81 mg by mouth daily.     atorvastatin  (LIPITOR) 80 MG tablet Take 1 tablet (80 mg total) by mouth daily. 90 tablet 3   Cholecalciferol (DIALYVITE VITAMIN D 5000) 125 MCG (5000 UT) capsule Take 5,000 Units by mouth daily.     furosemide (LASIX) 20 MG tablet Take 1 tablet (20 mg total) by mouth daily for 7 days. 7 tablet 0   HYDROcodone bit-homatropine (HYCODAN) 5-1.5 MG/5ML syrup Take 5 mLs by mouth every 6 (six) hours as needed for cough. 120 mL 0   metoprolol tartrate (LOPRESSOR) 25 MG tablet Take 0.5 tablets (12.5 mg total) by mouth 2 (two) times daily. (Patient taking differently: Take 12.5 mg by mouth 2 (two) times daily with breakfast and lunch.) 180 tablet 3   nitroGLYCERIN (NITROSTAT) 0.4 MG SL tablet Place 0.4 mg under the tongue every 5 (five) minutes as needed for chest pain.     No facility-administered medications prior to visit.     Review of Systems:   Constitutional:   No  weight loss, night sweats,  Fevers, chills,  +fatigue, or  lassitude.  HEENT:   No headaches,  Difficulty swallowing,  Tooth/dental problems, or  Sore throat,                No sneezing, itching, ear ache,+ nasal congestion, post nasal drip,   CV:  No chest pain,  Orthopnea, PND, swelling in lower extremities, anasarca, dizziness, palpitations, syncope.   GI  No heartburn, indigestion, abdominal pain, nausea, vomiting, diarrhea, change in bowel habits, loss of appetite, bloody stools.   Resp:   No chest wall deformity  Skin: no rash or lesions.  GU: no dysuria, change in color of urine, no urgency or frequency.  No flank pain, no hematuria   MS:  No joint pain or swelling.  No decreased range of motion.  No back pain.    Physical Exam  BP 100/80 (BP Location: Left Arm)   Pulse (!) 102   Ht 5\' 11"  (1.803 m)   Wt 162 lb 6.4 oz (73.7 kg)   SpO2 91%   BMI 22.65 kg/m   GEN: A/Ox3; pleasant , NAD, well nourished    HEENT:  Cave/AT,  EACs-clear, TMs-wnl, NOSE-clear, THROAT-clear, no lesions, no postnasal drip or exudate noted.    NECK:  Supple w/ fair ROM; no JVD; normal carotid impulses w/o bruits; no thyromegaly or nodules palpated; no lymphadenopathy.    RESP few scattered rhonchi  no accessory muscle use, no dullness to percussion  CARD:  RRR, no m/r/g, no peripheral edema, pulses intact, no cyanosis or clubbing.  GI:   Soft & nt; nml bowel sounds; no organomegaly or masses detected.   Musco: Warm bil, no deformities or joint swelling noted.   Neuro: alert, no focal deficits noted.    Skin: Warm, no lesions or rashes    Lab Results:  CBC    Component Value Date/Time   WBC 4.1 05/03/2022 1656   RBC 3.79 (L) 05/03/2022 1656   HGB 11.4 (L) 05/03/2022 1656   HGB 11.9 (L)  04/14/2022 0848   HGB 14.5 05/01/2021 1146   HCT 35.6 (L) 05/03/2022 1656   HCT 41.2 05/01/2021 1146   PLT 141 (L) 05/03/2022 1656   PLT 165 04/14/2022 0848   PLT 236 05/01/2021 1146   MCV 93.9 05/03/2022 1656   MCV 90 05/01/2021 1146   MCH 30.1 05/03/2022 1656   MCHC 32.0 05/03/2022 1656   RDW 15.1 05/03/2022 1656   RDW 11.8 05/01/2021 1146   LYMPHSABS 0.5 (L) 05/03/2022 1656   MONOABS 0.5 05/03/2022 1656   EOSABS 0.2 05/03/2022 1656   BASOSABS 0.0 05/03/2022 1656    BMET    Component Value Date/Time   NA 135 05/03/2022 1656   NA 139 05/01/2021 1146   K 3.7 05/03/2022 1656   CL 104 05/03/2022 1656   CO2 23 05/03/2022 1656   GLUCOSE 97 05/03/2022 1656   BUN 14 05/03/2022 1656   BUN 20 05/01/2021 1146   CREATININE 1.11 05/03/2022 1656   CREATININE 1.24 04/14/2022 0848   CALCIUM 8.4 (L) 05/03/2022 1656   GFRNONAA >60 05/03/2022 1656   GFRNONAA 59 (L) 04/14/2022 0848   GFRAA >60 05/17/2018 1525    BNP    Component Value Date/Time   BNP 559.0 (H) 05/03/2022 1656    ProBNP No results found for: "PROBNP"  Imaging: CT Chest W Contrast  Result Date: 05/05/2022 CLINICAL DATA:  Non-small cell right upper lobe lung cancer status post right upper lobe wedge resection 05/13/2021 with adjuvant chemo radiation  and ongoing consolidation immunotherapy. Restaging. * Tracking Code: BO * EXAM: CT CHEST WITH CONTRAST TECHNIQUE: Multidetector CT imaging of the chest was performed during intravenous contrast administration. RADIATION DOSE REDUCTION: This exam was performed according to the departmental dose-optimization program which includes automated exposure control, adjustment of the mA and/or kV according to patient size and/or use of iterative reconstruction technique. CONTRAST:  34mL OMNIPAQUE IOHEXOL 300 MG/ML  SOLN COMPARISON:  01/15/2022 chest CT. FINDINGS: Cardiovascular: Normal heart size. No significant pericardial effusion/thickening. Three-vessel coronary atherosclerosis status post CABG. Atherosclerotic nonaneurysmal thoracic aorta. Normal caliber pulmonary arteries. No central pulmonary emboli. Mediastinum/Nodes: No significant thyroid nodules. Unremarkable esophagus. No pathologically enlarged axillary, mediastinal or hilar lymph nodes. Lungs/Pleura: No pneumothorax. No pleural effusion. Moderate paraseptal and centrilobular emphysema with diffuse bronchial wall thickening. Status post posterior right upper lobe wedge resection. Solid posterior right upper lobe 2.8 x 1.1 cm pulmonary nodule adjacent to the wedge resection suture line (series 7/image 45), previously 2.9 x 1.2 cm using similar measurement technique, slightly decreased. Sharply marginated patchy bandlike consolidation in the superior segment right lower lobe with associated mild volume loss and distortion, compatible with evolving postradiation change. Minimally thickened curvilinear parenchymal band in the posterior basilar right lower lobe compatible with nonspecific postinfectious/postinflammatory scarring. Patchy sharply marginated left upper lobe paramediastinal and left perihilar lung consolidation with some volume loss and distortion, compatible with evolving postradiation change. New indistinct 0.6 cm posterior left lower lobe pulmonary  nodule (series 7/image 88). Upper abdomen: Cholecystectomy. Nonobstructing 3 mm upper right renal stone. Musculoskeletal: No aggressive appearing focal osseous lesions. Intact sternotomy wires. Mild thoracic spondylosis. IMPRESSION: 1. New 0.6 cm posterior left lower lobe pulmonary nodule, indeterminate for metastasis. Recommend attention on follow-up chest CT in 3 months. 2. Interval slight decrease in treated right upper lobe pulmonary nodule adjacent to the wedge resection suture line. Expected evolution of postradiation changes in both lungs as detailed. 3. No thoracic adenopathy. 4. Nonobstructing upper right renal stone. 5. Nonobstructing right nephrolithiasis. 6. Aortic Atherosclerosis (  ICD10-I70.0) and Emphysema (ICD10-J43.9). These results will be called to the ordering clinician or representative by the Radiologist Assistant, and communication documented in the PACS or [employer]. Electronically Signed   By: Ilona Sorrel M.D.   On: 05/05/2022 12:27   DG Chest Port 1 View  Result Date: 05/03/2022 CLINICAL DATA:  Cough EXAM: PORTABLE CHEST 1 VIEW COMPARISON:  Chest x-ray 02/14/2022.  Chest CT 01/15/2022 FINDINGS: Postsurgical changes, scarring and pleural thickening in the right upper lobe appear unchanged. The heart is enlarged. There central pulmonary vascular congestion. There is no evidence for pneumothorax or pleural effusion. Emphysematous changes are again noted. Sternotomy wires are present. No acute fractures are seen. IMPRESSION: 1. Cardiomegaly with central pulmonary vascular congestion. 2. Stable postsurgical changes, scarring and pleural thickening in the right upper lobe. Electronically Signed   By: Ronney Asters M.D.   On: 05/03/2022 16:08    ciprofloxacin (CIPRO) tablet 500 mg     Date Action Dose Route User   05/05/2022 1144 Given 500 mg Oral Clark, Shaniqua R, CMA      durvalumab (IMFINZI) 1,500 mg in sodium chloride 0.9 % 100 mL chemo infusion     Date Action Dose Route  User   Discharged on 05/03/2022   Admitted on 05/03/2022   03/16/2022 1136 Rate/Dose Change (none) Intravenous Willis Modena, RN   03/16/2022 1136 New Bag/Given 1,500 mg Intravenous Georgette Shell T, RN      durvalumab (IMFINZI) 1,500 mg in sodium chloride 0.9 % 100 mL chemo infusion     Date Action Dose Route User   Discharged on 05/03/2022   Admitted on 05/03/2022   04/14/2022 1157 Rate/Dose Change (none) Intravenous Scot Dock, RN   04/14/2022 1156 Rate/Dose Change (none) Intravenous Scot Dock, RN   04/14/2022 1056 Rate/Dose Change (none) Intravenous Scot Dock, RN   04/14/2022 1056 New Bag/Given 1,500 mg Intravenous Scot Dock, RN      0.9 %  sodium chloride infusion     Date Action Dose Route User   Discharged on 05/03/2022   Admitted on 05/03/2022   03/16/2022 1243 Rate/Dose Change (none) Intravenous Willis Modena, RN   03/16/2022 1134 New Bag/Given (none) Intravenous Georgette Shell T, RN      0.9 %  sodium chloride infusion     Date Action Dose Route User   Discharged on 05/03/2022   Admitted on 05/03/2022   04/14/2022 1201 Rate/Dose Change (none) Intravenous Scot Dock, RN   04/14/2022 1023 New Bag/Given (none) Intravenous Scot Dock, RN           No data to display          No results found for: "NITRICOXIDE"      Assessment & Plan:   Chronic cough Chronic cough-suspect is multifactorial.-With underlying emphysema, lung cancer, previous radiation and ongoing immunotherapy.   CT chest shows no evidence of pneumonitis.  Does show evolving radiation changes. For now we will treat for an upper airway cough syndrome with trigger control for postnasal drainage, GERD and cough control regimen. Agree with steroid taper.  Plan  Patient Instructions  Begin Delsym 2 tsp Twice daily  for cough , As needed   Tessalon Three times a day  for cough As needed   Begin Allegra 180mg  daily in am  Begin Chlorpheniramine 4mg  At bedtime  As needed  drainage/cough  (Chlor tabs)  May use Chlorpheniramine 4mg  every 6hr as needed for drainage /cough -may make you sleepy  use caution  Finish prednisone taper as directed  Sips of water to soothe throat , avoid throat clearing . NO MINTS  Begin Pepcid 20mg  At bedtime   Continue on Breztri Twice daily  rinse after use.  Albuterol inhaler /neb As needed   Follow up with Dr Valeta Harms in 6 weeks and As needed   Please contact office for sooner follow up if symptoms do not improve or worsen or seek emergency care       Adenocarcinoma of right lung, stage III [facility]) Continue follow-up with oncology and current maintenance regimen.    I spent  41  minutes dedicated to the care of this patient on the date of this encounter to include pre-visit review of records, face-to-face time with the patient discussing conditions above, post visit ordering of testing, clinical documentation with the electronic health record, making appropriate referrals as documented, and communicating necessary findings to members of the patients care team.   Rexene Edison, NP 05/08/2022

## 2022-05-08 NOTE — Telephone Encounter (Signed)
     Patient  visit on 1/21  at [facility] you been able to follow up with your primary care physician? Yes   The patient was or was not able to obtain any needed medicine or equipment. Yes   Are there diet recommendations that you are having difficulty following? Na   Patient expresses understanding of discharge instructions and education provided has no other needs at this time.   Yes     [place], [facility], Care Management  [phone] [address], [place],  [address] Phone: [phone] Email: [email or web address]

## 2022-05-08 NOTE — Patient Instructions (Addendum)
Begin Delsym 2 tsp Twice daily  for cough , As needed   Tessalon Three times a day  for cough As needed   Begin Allegra 180mg  daily in am  Begin Chlorpheniramine 4mg  At bedtime  As needed  drainage/cough (Chlor tabs)  May use Chlorpheniramine 4mg  every 6hr as needed for drainage /cough -may make you sleepy use caution  Finish prednisone taper as directed  Sips of water to soothe throat , avoid throat clearing . NO MINTS  Begin Pepcid 20mg  At bedtime   Continue on Breztri Twice daily  rinse after use.  Albuterol inhaler /neb As needed   Follow up with Dr Valeta Harms in 6 weeks and As needed   Please contact office for sooner follow up if symptoms do not improve or worsen or seek emergency care

## 2022-05-08 NOTE — Assessment & Plan Note (Signed)
Continue follow-up with oncology and current maintenance regimen.

## 2022-05-08 NOTE — Assessment & Plan Note (Signed)
Chronic cough-suspect is multifactorial.-With underlying emphysema, lung cancer, previous radiation and ongoing immunotherapy.   CT chest shows no evidence of pneumonitis.  Does show evolving radiation changes. For now we will treat for an upper airway cough syndrome with trigger control for postnasal drainage, GERD and cough control regimen. Agree with steroid taper.  Plan  Patient Instructions  Begin Delsym 2 tsp Twice daily  for cough , As needed   Tessalon Three times a day  for cough As needed   Begin Allegra 180mg  daily in am  Begin Chlorpheniramine 4mg  At bedtime  As needed  drainage/cough (Chlor tabs)  May use Chlorpheniramine 4mg  every 6hr as needed for drainage /cough -may make you sleepy use caution  Finish prednisone taper as directed  Sips of water to soothe throat , avoid throat clearing . NO MINTS  Begin Pepcid 20mg  At bedtime   Continue on Breztri Twice daily  rinse after use.  Albuterol inhaler /neb As needed   Follow up with Dr Valeta Harms in 6 weeks and As needed   Please contact office for sooner follow up if symptoms do not improve or worsen or seek emergency care

## 2022-05-11 ENCOUNTER — Inpatient Hospital Stay: Payer: Medicare HMO

## 2022-05-11 ENCOUNTER — Other Ambulatory Visit: Payer: Self-pay | Admitting: Medical Oncology

## 2022-05-11 ENCOUNTER — Inpatient Hospital Stay (HOSPITAL_BASED_OUTPATIENT_CLINIC_OR_DEPARTMENT_OTHER): Payer: Medicare HMO | Admitting: Internal Medicine

## 2022-05-11 ENCOUNTER — Other Ambulatory Visit: Payer: Self-pay

## 2022-05-11 ENCOUNTER — Encounter: Payer: Self-pay | Admitting: Urology

## 2022-05-11 VITALS — BP 114/77 | HR 87 | Resp 17

## 2022-05-11 DIAGNOSIS — C3491 Malignant neoplasm of unspecified part of right bronchus or lung: Secondary | ICD-10-CM | POA: Diagnosis not present

## 2022-05-11 DIAGNOSIS — I2510 Atherosclerotic heart disease of native coronary artery without angina pectoris: Secondary | ICD-10-CM | POA: Diagnosis not present

## 2022-05-11 DIAGNOSIS — Z79899 Other long term (current) drug therapy: Secondary | ICD-10-CM | POA: Diagnosis not present

## 2022-05-11 DIAGNOSIS — C3411 Malignant neoplasm of upper lobe, right bronchus or lung: Secondary | ICD-10-CM | POA: Diagnosis not present

## 2022-05-11 DIAGNOSIS — E785 Hyperlipidemia, unspecified: Secondary | ICD-10-CM | POA: Diagnosis not present

## 2022-05-11 DIAGNOSIS — Z7982 Long term (current) use of aspirin: Secondary | ICD-10-CM | POA: Diagnosis not present

## 2022-05-11 DIAGNOSIS — Z5112 Encounter for antineoplastic immunotherapy: Secondary | ICD-10-CM | POA: Diagnosis not present

## 2022-05-11 DIAGNOSIS — Z923 Personal history of irradiation: Secondary | ICD-10-CM | POA: Diagnosis not present

## 2022-05-11 LAB — CMP (CANCER CENTER ONLY)
ALT: 13 U/L (ref 0–44)
AST: 13 U/L — ABNORMAL LOW (ref 15–41)
Albumin: 3.6 g/dL (ref 3.5–5.0)
Alkaline Phosphatase: 86 U/L (ref 38–126)
Anion gap: 7 (ref 5–15)
BUN: 23 mg/dL (ref 8–23)
CO2: 25 mmol/L (ref 22–32)
Calcium: 9 mg/dL (ref 8.9–10.3)
Chloride: 104 mmol/L (ref 98–111)
Creatinine: 1.27 mg/dL — ABNORMAL HIGH (ref 0.61–1.24)
GFR, Estimated: 57 mL/min — ABNORMAL LOW (ref >60)
Glucose, Bld: 93 mg/dL (ref 70–99)
Potassium: 4 mmol/L (ref 3.5–5.1)
Sodium: 136 mmol/L (ref 135–145)
Total Bilirubin: 0.9 mg/dL (ref 0.3–1.2)
Total Protein: 6.9 g/dL (ref 6.5–8.1)

## 2022-05-11 LAB — CBC WITH DIFFERENTIAL (CANCER CENTER ONLY)
Abs Immature Granulocytes: 0.05 10*3/uL (ref 0.00–0.07)
Basophils Absolute: 0 10*3/uL (ref 0.0–0.1)
Basophils Relative: 0 %
Eosinophils Absolute: 0.3 10*3/uL (ref 0.0–0.5)
Eosinophils Relative: 3 %
HCT: 39.2 % (ref 39.0–52.0)
Hemoglobin: 13.4 g/dL (ref 13.0–17.0)
Immature Granulocytes: 1 %
Lymphocytes Relative: 13 %
Lymphs Abs: 1.2 10*3/uL (ref 0.7–4.0)
MCH: 30.5 pg (ref 26.0–34.0)
MCHC: 34.2 g/dL (ref 30.0–36.0)
MCV: 89.3 fL (ref 80.0–100.0)
Monocytes Absolute: 0.7 10*3/uL (ref 0.1–1.0)
Monocytes Relative: 8 %
Neutro Abs: 6.7 10*3/uL (ref 1.7–7.7)
Neutrophils Relative %: 75 %
Platelet Count: 239 10*3/uL (ref 150–400)
RBC: 4.39 MIL/uL (ref 4.22–5.81)
RDW: 14.5 % (ref 11.5–15.5)
WBC Count: 9 10*3/uL (ref 4.0–10.5)
nRBC: 0 % (ref 0.0–0.2)

## 2022-05-11 MED ORDER — SODIUM CHLORIDE 0.9 % IV SOLN: Freq: Once | INTRAVENOUS | Status: AC

## 2022-05-11 MED ORDER — SODIUM CHLORIDE 0.9 % IV SOLN
1500.0000 mg | Freq: Once | INTRAVENOUS | Status: AC
  Administered 2022-05-11: 1500 mg via INTRAVENOUS
  Filled 2022-05-11: qty 30

## 2022-05-11 NOTE — Patient Instructions (Signed)
[facility]  Discharge Instructions: Thank you for choosing [facility] to provide your oncology and hematology care.   If you have a lab appointment with the [facility], please go directly to the [facility] and check in at the registration area.   Wear comfortable clothing and clothing appropriate for easy access to any Portacath or PICC line.   We strive to give you quality time with your provider. You may need to reschedule your appointment if you arrive late (15 or more minutes).  Arriving late affects you and other patients whose appointments are after yours.  Also, if you miss three or more appointments without notifying the office, you may be dismissed from the clinic at the provider's discretion.      For prescription refill requests, have your pharmacy contact our office and allow 72 hours for refills to be completed.    Today you received the following chemotherapy and/or immunotherapy agent: Durvalumab (Imfinzi)   To help prevent nausea and vomiting after your treatment, we encourage you to take your nausea medication as directed.  BELOW ARE SYMPTOMS THAT SHOULD BE REPORTED IMMEDIATELY: *FEVER GREATER THAN 100.4 F (38 C) OR HIGHER *CHILLS OR SWEATING *NAUSEA AND VOMITING THAT IS NOT CONTROLLED WITH YOUR NAUSEA MEDICATION *UNUSUAL SHORTNESS OF BREATH *UNUSUAL BRUISING OR BLEEDING *URINARY PROBLEMS (pain or burning when urinating, or frequent urination) *BOWEL PROBLEMS (unusual diarrhea, constipation, pain near the anus) TENDERNESS IN MOUTH AND THROAT WITH OR WITHOUT PRESENCE OF ULCERS (sore throat, sores in mouth, or a toothache) UNUSUAL RASH, SWELLING OR PAIN  UNUSUAL VAGINAL DISCHARGE OR ITCHING   Items with * indicate a potential emergency and should be followed up as soon as possible or go to the Emergency Department if any problems should occur.  Please show the CHEMOTHERAPY ALERT CARD or IMMUNOTHERAPY ALERT CARD  at check-in to the Emergency Department and triage nurse.  Should you have questions after your visit or need to cancel or reschedule your appointment, please contact [facility]  Dept: [phone]  and follow the prompts.  Office hours are 8:00 a.m. to 4:30 p.m. Monday - Friday. Please note that voicemails left after 4:00 p.m. may not be returned until the following business day.  We are closed weekends and major holidays. You have access to a nurse at all times for urgent questions. Please call the main number to the clinic Dept: [phone] and follow the prompts.   For any non-urgent questions, you may also contact your provider using MyChart. We now offer e-Visits for anyone 73 and older to request care online for non-urgent symptoms. For details visit [email or web address].   Also download the MyChart app! Go to the app store, search "MyChart", open the app, select [facility], and log in with your MyChart username and password.  Durvalumab Injection What is this medication? DURVALUMAB (dur VAL ue mab) treats some types of cancer. It works by helping your immune system slow or stop the spread of cancer cells. It is a monoclonal antibody. This medicine may be used for other purposes; ask your health care provider or pharmacist if you have questions. COMMON BRAND NAME(S): IMFINZI What should I tell my care team before I take this medication? They need to know if you have any of these conditions: Allogeneic stem cell transplant (uses someone else's stem cells) Autoimmune diseases, such as Crohn disease, ulcerative colitis, lupus History of chest radiation Nervous system problems, such  as Guillain-Barre syndrome, myasthenia gravis Organ transplant An unusual or allergic reaction to durvalumab, other medications, foods, dyes, or preservatives Pregnant or trying to get pregnant Breast-feeding How should I use this medication? This medication is  infused into a vein. It is given by your care team in a hospital or clinic setting. A special MedGuide will be given to you before each treatment. Be sure to read this information carefully each time. Talk to your care team about the use of this medication in children. Special care may be needed. Overdosage: If you think you have taken too much of this medicine contact a poison control center or emergency room at once. NOTE: This medicine is only for you. Do not share this medicine with others. What if I miss a dose? Keep appointments for follow-up doses. It is important not to miss your dose. Call your care team if you are unable to keep an appointment. What may interact with this medication? Interactions have not been studied. This list may not describe all possible interactions. Give your health care provider a list of all the medicines, herbs, non-prescription drugs, or dietary supplements you use. Also tell them if you smoke, drink alcohol, or use illegal drugs. Some items may interact with your medicine. What should I watch for while using this medication? Your condition will be monitored carefully while you are receiving this medication. You may need blood work while taking this medication. This medication may cause serious skin reactions. They can happen weeks to months after starting the medication. Contact your care team right away if you notice fevers or flu-like symptoms with a rash. The rash may be red or purple and then turn into blisters or peeling of the skin. You may also notice a red rash with swelling of the face, lips, or lymph nodes in your neck or under your arms. Tell your care team right away if you have any change in your eyesight. Talk to your care team if you may be pregnant. Serious birth defects can occur if you take this medication during pregnancy and for 3 months after the last dose. You will need a negative pregnancy test before starting this medication. Contraception  is recommended while taking this medication and for 3 months after the last dose. Your care team can help you find the option that works for you. Do not breastfeed while taking this medication and for 3 months after the last dose. What side effects may I notice from receiving this medication? Side effects that you should report to your care team as soon as possible: Allergic reactions--skin rash, itching, hives, swelling of the face, lips, tongue, or throat Dry cough, shortness of breath or trouble breathing Eye pain, redness, irritation, or discharge with blurry or decreased vision Heart muscle inflammation--unusual weakness or fatigue, shortness of breath, chest pain, fast or irregular heartbeat, dizziness, swelling of the ankles, feet, or hands Hormone gland problems--headache, sensitivity to light, unusual weakness or fatigue, dizziness, fast or irregular heartbeat, increased sensitivity to cold or heat, excessive sweating, constipation, hair loss, increased thirst or amount of urine, tremors or shaking, irritability Infusion reactions--chest pain, shortness of breath or trouble breathing, feeling faint or lightheaded Kidney injury (glomerulonephritis)--decrease in the amount of urine, red or dark brown urine, foamy or bubbly urine, swelling of the ankles, hands, or feet Liver injury--right upper belly pain, loss of appetite, nausea, light-colored stool, dark yellow or brown urine, yellowing skin or eyes, unusual weakness or fatigue Pain, tingling, or numbness in the  hands or feet, muscle weakness, change in vision, confusion or trouble speaking, loss of balance or coordination, trouble walking, seizures Rash, fever, and swollen lymph nodes Redness, blistering, peeling, or loosening of the skin, including inside the mouth Sudden or severe stomach pain, bloody diarrhea, fever, nausea, vomiting Side effects that usually do not require medical attention (report these to your care team if they  continue or are bothersome): Bone, joint, or muscle pain Diarrhea Fatigue Loss of appetite Nausea Skin rash This list may not describe all possible side effects. Call your doctor for medical advice about side effects. You may report side effects to FDA at 1-800-FDA-1088. Where should I keep my medication? This medication is given in a hospital or clinic. It will not be stored at home. NOTE: This sheet is a summary. It may not cover all possible information. If you have questions about this medicine, talk to your doctor, pharmacist, or health care provider.  2023 [employer]/Gold Standard (2021-07-21 00:00:00)

## 2022-05-11 NOTE — Progress Notes (Signed)
[place] [phone]   [phone]  PROGRESS NOTE FOR TELEMEDICINE VISITS  Kevin Chroman, MD [address] [place] [address]  I connected withNAME@ on 05/11/22 at 10:00 AM EST by video enabled telemedicine visit and verified that I am speaking with the correct person using two identifiers.   I discussed the limitations, risks, security and privacy concerns of performing an evaluation and management service by telemedicine and the availability of in-person appointments. I also discussed with the patient that there may be a patient responsible charge related to this service. The patient expressed understanding and agreed to proceed.  Other persons participating in the visit and their role in the encounter: His wife  Patient's location: [place] exam room Provider's location: [place] office  DIAGNOSIS: Stage IIIC (T3, N3, M0) non-small cell lung cancer, adenocarcinoma presented with right upper lobe lung mass status post right upper lobe wedge resection but no lymph node dissection.  The tumor measured 3.3 cm with visceral pleural involvement and very close resection margin less than 0.1 cm from the tumor.  This was performed on May 13, 2021 in the same setting of CABG under the care of Dr. Kipp Brood.  PET scan performed in March 2023 showed a right upper lobe nodule adjacent to the wedge resection site small hypermetabolic right hilar, right subcarinal, and left infrahilar lymph nodes which could be reactive but are suspicious for nodal involvement.   Molecular studies showed no actionable mutations and PD-L1 expression was 0%   PRIOR THERAPY:  1) Right upper lobe wedge resection under the care of Dr. Kipp Brood on 05/13/2021. 2) Concurrent chemoradiation with weekly carboplatin for AUC of 2 and paclitaxel 45 Mg/M2.  First dose Aug 11, 2021.  Status post 6 cycles.  Last dose was given September 22, 2021   CURRENT THERAPY:  Consolidation treatment with immunotherapy with Imfinzi 1500 Mg IV every 4 weeks.  First dose October 27, 2021.  Status post 6 cycles.  INTERVAL HISTORY: Kevin [name] 82 y.o. male returns to the clinic today for follow-up visit accompanied by his wife.  The patient is feeling fine today with no concerning complaints except for persistent cough.  He was diagnosed a week ago with RSV at [facility].  He was treated with over-the-counter cough medication as well as Pepcid.  He denied having any current chest pain, shortness of breath or hemoptysis.  He has no nausea, vomiting, diarrhea or constipation.  He has no headache or visual changes.  He has no recent weight loss or night sweats.  He had repeat CT scan of the chest performed recently and he is here for evaluation and discussion of his scan results.  MEDICAL HISTORY: Past Medical History:  Diagnosis Date   Arthritis    Coronary artery disease    Hyperlipidemia    SCC (squamous cell carcinoma) 05/28/2004   top of left hand curet x3 50fu   SCC (squamous cell carcinoma) 09/13/2013   mid back tx cx3 22fu   SCC (squamous cell carcinoma) 09/06/2018   left wrist tx with bx   Squamous cell carcinoma of skin 11/16/2003   left ear tip tx curet and cautery 57fu    ALLERGIES:  has No Known Allergies.  MEDICATIONS:  Current Outpatient Medications  Medication Sig Dispense Refill   acetaminophen (TYLENOL) 500 MG tablet Take 500 mg by mouth daily as needed for moderate pain, fever or headache.     albuterol (PROVENTIL) (2.5  MG/3ML) 0.083% nebulizer solution Take 3 mLs (2.5 mg total) by nebulization every 6 (six) hours as needed for wheezing or shortness of breath. (Patient not taking: Reported on 05/08/2022) 360 mL 11   aspirin EC 81 MG tablet Take 81 mg by mouth daily.     atorvastatin (LIPITOR) 80 MG tablet Take 1 tablet (80 mg total) by mouth daily. 90 tablet 3   benzonatate (TESSALON) 200 MG capsule Take 1 capsule (200 mg total) by mouth 3  (three) times daily as needed. 45 capsule 2   Budeson-Glycopyrrol-Formoterol (BREZTRI AEROSPHERE) 160-9-4.8 MCG/ACT AERO Inhale 2 puffs into the lungs in the morning and at bedtime. 1 each 5   Cholecalciferol (DIALYVITE VITAMIN D 5000) 125 MCG (5000 UT) capsule Take 5,000 Units by mouth daily.     furosemide (LASIX) 20 MG tablet Take 1 tablet (20 mg total) by mouth daily for 7 days. 7 tablet 0   HYDROcodone bit-homatropine (HYCODAN) 5-1.5 MG/5ML syrup Take 5 mLs by mouth every 6 (six) hours as needed for cough. 120 mL 0   metoprolol tartrate (LOPRESSOR) 25 MG tablet Take 0.5 tablets (12.5 mg total) by mouth 2 (two) times daily. (Patient taking differently: Take 12.5 mg by mouth 2 (two) times daily with breakfast and lunch.) 180 tablet 3   nitroGLYCERIN (NITROSTAT) 0.4 MG SL tablet Place 0.4 mg under the tongue every 5 (five) minutes as needed for chest pain.     No current facility-administered medications for this visit.    SURGICAL HISTORY:  Past Surgical History:  Procedure Laterality Date   BRONCHIAL BIOPSY  07/22/2021   Procedure: BRONCHIAL BIOPSIES;  Surgeon: Collene Gobble, MD;  Location: [place];  Service: Cardiopulmonary;;   BRONCHIAL BRUSHINGS  07/22/2021   Procedure: BRONCHIAL BRUSHINGS;  Surgeon: Collene Gobble, MD;  Location: [place];  Service: Cardiopulmonary;;   BRONCHIAL NEEDLE ASPIRATION BIOPSY  07/22/2021   Procedure: BRONCHIAL NEEDLE ASPIRATION BIOPSIES;  Surgeon: Collene Gobble, MD;  Location: [place];  Service: Cardiopulmonary;;   CHOLECYSTECTOMY N/A 02/15/2022   Procedure: LAPAROSCOPIC CHOLECYSTECTOMY;  Surgeon: Virl Cagey, MD;  Location: [facility];  Service: General;  Laterality: N/A;   CORONARY ARTERY BYPASS GRAFT N/A 05/13/2021   Procedure: CORONARY ARTERY BYPASS GRAFTING (CABG) TIMES 3  , ON PUMP, USING LEFT INTERNAL MAMMARY ARTERY AND RIGHT GREATER SAPHENOUS VEIN HARVESTED ENDOSCOPICALLY;  Surgeon: Lajuana Matte, MD;  Location: [place];   Service: Open Heart Surgery;  Laterality: N/A;   ENDOVEIN HARVEST OF GREATER SAPHENOUS VEIN Right 05/13/2021   Procedure: ENDOVEIN HARVEST OF GREATER SAPHENOUS VEIN;  Surgeon: Lajuana Matte, MD;  Location: [place];  Service: Open Heart Surgery;  Laterality: Right;   epidural     back pain   IR CT SPINE LTD  06/18/2020   IR KYPHO LUMBAR INC FX REDUCE BONE BX UNI/BIL CANNULATION INC/IMAGING  06/18/2020   LEFT HEART CATH AND CORONARY ANGIOGRAPHY N/A 05/06/2021   Procedure: LEFT HEART CATH AND CORONARY ANGIOGRAPHY;  Surgeon: [name], MD;  Location: [place] [facility];  Service: Cardiovascular;  Laterality: N/A;   LUNG BIOPSY Right 05/13/2021   Procedure: RIGHT LUNG BIOPSY;  Surgeon: Lajuana Matte, MD;  Location: [place];  Service: Open Heart Surgery;  Laterality: Right;   MINOR HEMORRHOIDECTOMY     SHOULDER SURGERY     TEE WITHOUT CARDIOVERSION N/A 05/13/2021   Procedure: TRANSESOPHAGEAL ECHOCARDIOGRAM (TEE);  Surgeon: Lajuana Matte, MD;  Location: [place];  Service: Open Heart Surgery;  Laterality: N/A;  TRANSURETHRAL RESECTION OF BLADDER TUMOR  2014   TRANSURETHRAL RESECTION OF BLADDER TUMOR N/A 05/19/2018   Procedure: TRANSURETHRAL RESECTION OF BLADDER TUMOR (TURBT) AND (INTRAVESICAL GEMCITABINE INSTILLED IN BLADDER IN PACU) ;  Surgeon: Franchot Gallo, MD;  Location: [facility];  Service: Urology;  Laterality: N/A;  [place] Bilateral 07/22/2021   Procedure: VIDEO BRONCHOSCOPY WITH ENDOBRONCHIAL ULTRASOUND;  Surgeon: Collene Gobble, MD;  Location: [facility];  Service: Cardiopulmonary;  Laterality: Bilateral;    REVIEW OF SYSTEMS:  Constitutional: positive for fatigue Eyes: negative Ears, nose, mouth, throat, and face: negative Respiratory: positive for cough Cardiovascular: negative Gastrointestinal: negative Genitourinary:negative Integument/breast: negative Hematologic/lymphatic:  negative Musculoskeletal:negative Neurological: negative Behavioral/Psych: negative Endocrine: negative Allergic/Immunologic: negative    ECOG PERFORMANCE STATUS: 1 - Symptomatic but completely ambulatory  Blood pressure 110/64, pulse 63, temperature 97.7 F (36.5 C), temperature source Temporal, resp. rate 16, height 5\' 11"  (1.803 m), weight 163 lb 6.4 oz (74.1 kg), SpO2 95 %.  LABORATORY DATA: Lab Results  Component Value Date   WBC 9.0 05/11/2022   HGB 13.4 05/11/2022   HCT 39.2 05/11/2022   MCV 89.3 05/11/2022   PLT 239 05/11/2022      Chemistry      Component Value Date/Time   NA 135 05/03/2022 1656   NA 139 05/01/2021 1146   K 3.7 05/03/2022 1656   CL 104 05/03/2022 1656   CO2 23 05/03/2022 1656   BUN 14 05/03/2022 1656   BUN 20 05/01/2021 1146   CREATININE 1.11 05/03/2022 1656   CREATININE 1.24 04/14/2022 0848      Component Value Date/Time   CALCIUM 8.4 (L) 05/03/2022 1656   ALKPHOS 81 05/03/2022 1656   AST 16 05/03/2022 1656   AST 12 (L) 04/14/2022 0848   ALT 13 05/03/2022 1656   ALT 10 04/14/2022 0848   BILITOT 0.6 05/03/2022 1656   BILITOT 1.0 04/14/2022 0848       RADIOGRAPHIC STUDIES: CT Chest W Contrast  Result Date: 05/05/2022 CLINICAL DATA:  Non-small cell right upper lobe lung cancer status post right upper lobe wedge resection 05/13/2021 with adjuvant chemo radiation and ongoing consolidation immunotherapy. Restaging. * Tracking Code: BO * EXAM: CT CHEST WITH CONTRAST TECHNIQUE: Multidetector CT imaging of the chest was performed during intravenous contrast administration. RADIATION DOSE REDUCTION: This exam was performed according to the departmental dose-optimization program which includes automated exposure control, adjustment of the mA and/or kV according to patient size and/or use of iterative reconstruction technique. CONTRAST:  40mL OMNIPAQUE IOHEXOL 300 MG/ML  SOLN COMPARISON:  01/15/2022 chest CT. FINDINGS: Cardiovascular: Normal heart  size. No significant pericardial effusion/thickening. Three-vessel coronary atherosclerosis status post CABG. Atherosclerotic nonaneurysmal thoracic aorta. Normal caliber pulmonary arteries. No central pulmonary emboli. Mediastinum/Nodes: No significant thyroid nodules. Unremarkable esophagus. No pathologically enlarged axillary, mediastinal or hilar lymph nodes. Lungs/Pleura: No pneumothorax. No pleural effusion. Moderate paraseptal and centrilobular emphysema with diffuse bronchial wall thickening. Status post posterior right upper lobe wedge resection. Solid posterior right upper lobe 2.8 x 1.1 cm pulmonary nodule adjacent to the wedge resection suture line (series 7/image 45), previously 2.9 x 1.2 cm using similar measurement technique, slightly decreased. Sharply marginated patchy bandlike consolidation in the superior segment right lower lobe with associated mild volume loss and distortion, compatible with evolving postradiation change. Minimally thickened curvilinear parenchymal band in the posterior basilar right lower lobe compatible with nonspecific postinfectious/postinflammatory scarring. Patchy sharply marginated left upper lobe paramediastinal and left perihilar lung consolidation with  some volume loss and distortion, compatible with evolving postradiation change. New indistinct 0.6 cm posterior left lower lobe pulmonary nodule (series 7/image 88). Upper abdomen: Cholecystectomy. Nonobstructing 3 mm upper right renal stone. Musculoskeletal: No aggressive appearing focal osseous lesions. Intact sternotomy wires. Mild thoracic spondylosis. IMPRESSION: 1. New 0.6 cm posterior left lower lobe pulmonary nodule, indeterminate for metastasis. Recommend attention on follow-up chest CT in 3 months. 2. Interval slight decrease in treated right upper lobe pulmonary nodule adjacent to the wedge resection suture line. Expected evolution of postradiation changes in both lungs as detailed. 3. No thoracic adenopathy.  4. Nonobstructing upper right renal stone. 5. Nonobstructing right nephrolithiasis. 6. Aortic Atherosclerosis (ICD10-I70.0) and Emphysema (ICD10-J43.9). These results will be called to the ordering clinician or representative by the Radiologist Assistant, and communication documented in the PACS or [employer]. Electronically Signed   By: Ilona Sorrel M.D.   On: 05/05/2022 12:27   DG Chest Port 1 View  Result Date: 05/03/2022 CLINICAL DATA:  Cough EXAM: PORTABLE CHEST 1 VIEW COMPARISON:  Chest x-ray 02/14/2022.  Chest CT 01/15/2022 FINDINGS: Postsurgical changes, scarring and pleural thickening in the right upper lobe appear unchanged. The heart is enlarged. There central pulmonary vascular congestion. There is no evidence for pneumothorax or pleural effusion. Emphysematous changes are again noted. Sternotomy wires are present. No acute fractures are seen. IMPRESSION: 1. Cardiomegaly with central pulmonary vascular congestion. 2. Stable postsurgical changes, scarring and pleural thickening in the right upper lobe. Electronically Signed   By: Ronney Asters M.D.   On: 05/03/2022 16:08    ASSESSMENT AND PLAN:  This is a very pleasant 82 years old white male diagnosed with stage IIIC (T3, N3, M0) non-small cell lung cancer, adenocarcinoma with no actionable mutations and PD-L1 expression of 0% diagnosed in January 2023 status post wedge resection of the right upper lobe lung mass in a setting of CABG under the care of Dr. Kipp Brood but the PET scan showed residual disease in the right upper lobe in addition to hypermetabolic right hilar, subcarinal and left infrahilar lymph nodes. The patient was seen by Dr. Lamonte Sakai and biopsy of the right upper lobe as well as the subcarinal and left hilar lymph nodes were positive for malignancy consistent with non-small cell lung cancer, adenocarcinoma. He underwent a course of concurrent chemoradiation with weekly carboplatin for AUC of 2 and paclitaxel 45 Mg/M2 status  post 6 cycles.  The patient tolerated this previous course of concurrent chemoradiation fairly well except for fatigue and mild odynophagia. The patient is currently undergoing consolidation treatment with immunotherapy with Imfinzi 1500 Mg IV every 4 weeks status post 6 cycles.     The patient has been tolerating this treatment well with no concerning adverse effects. He had repeat CT scan of the chest performed recently.  I personally and independently reviewed the scan and discussed the result with the patient and his wife. His scan showed no concerning findings for disease progression but there was development of new 0.6 cm left lower lobe nodule.  With the recent upper respiratory infection and RSV, this is nonspecific and need close monitoring before consideration of possible metastatic lesion. I recommended for the patient to continue his current treatment with Imfinzi and he will proceed with cycle #7 today. I will see him back for follow-up visit in 4 weeks for evaluation before starting cycle #8. We will reevaluate this pulmonary nodules in around 3 months from now with repeat CT scan of the chest for restaging of his  disease. The patient was advised to call immediately if he has any other concerning symptoms in the interval.  I discussed the assessment and treatment plan with the patient. The patient was provided an opportunity to ask questions and all were answered. The patient agreed with the plan and demonstrated an understanding of the instructions.   The patient was advised to call back or seek an in-person evaluation if the symptoms worsen or if the condition fails to improve as anticipated.  I provided 30 minutes of face-to-face video visit time during this encounter, and > 50% was spent counseling as documented under my assessment & plan.  Eilleen Kempf, MD 05/11/2022 10:04 AM  Disclaimer: This note was dictated with voice recognition software. Similar sounding words can  inadvertently be transcribed and may not be corrected upon review.

## 2022-05-13 DIAGNOSIS — N1831 Chronic kidney disease, stage 3a: Secondary | ICD-10-CM | POA: Diagnosis not present

## 2022-05-13 DIAGNOSIS — N400 Enlarged prostate without lower urinary tract symptoms: Secondary | ICD-10-CM | POA: Diagnosis not present

## 2022-06-08 ENCOUNTER — Inpatient Hospital Stay: Payer: Medicare HMO

## 2022-06-08 ENCOUNTER — Inpatient Hospital Stay (HOSPITAL_BASED_OUTPATIENT_CLINIC_OR_DEPARTMENT_OTHER): Payer: Medicare HMO | Admitting: Internal Medicine

## 2022-06-08 ENCOUNTER — Other Ambulatory Visit: Payer: Self-pay

## 2022-06-08 ENCOUNTER — Inpatient Hospital Stay: Payer: Medicare HMO | Attending: Internal Medicine

## 2022-06-08 ENCOUNTER — Encounter: Payer: Self-pay | Admitting: Internal Medicine

## 2022-06-08 ENCOUNTER — Encounter: Payer: Self-pay | Admitting: Medical Oncology

## 2022-06-08 VITALS — BP 104/68 | HR 95 | Temp 98.2°F | Resp 17 | Wt 162.6 lb

## 2022-06-08 DIAGNOSIS — Z923 Personal history of irradiation: Secondary | ICD-10-CM | POA: Insufficient documentation

## 2022-06-08 DIAGNOSIS — Z79620 Long term (current) use of immunosuppressive biologic: Secondary | ICD-10-CM | POA: Insufficient documentation

## 2022-06-08 DIAGNOSIS — Z9221 Personal history of antineoplastic chemotherapy: Secondary | ICD-10-CM | POA: Insufficient documentation

## 2022-06-08 DIAGNOSIS — C3491 Malignant neoplasm of unspecified part of right bronchus or lung: Secondary | ICD-10-CM

## 2022-06-08 DIAGNOSIS — Z5112 Encounter for antineoplastic immunotherapy: Secondary | ICD-10-CM | POA: Insufficient documentation

## 2022-06-08 DIAGNOSIS — Z79899 Other long term (current) drug therapy: Secondary | ICD-10-CM | POA: Diagnosis not present

## 2022-06-08 DIAGNOSIS — R058 Other specified cough: Secondary | ICD-10-CM | POA: Diagnosis not present

## 2022-06-08 DIAGNOSIS — Z902 Acquired absence of lung [part of]: Secondary | ICD-10-CM | POA: Insufficient documentation

## 2022-06-08 DIAGNOSIS — C3411 Malignant neoplasm of upper lobe, right bronchus or lung: Secondary | ICD-10-CM | POA: Insufficient documentation

## 2022-06-08 LAB — CBC WITH DIFFERENTIAL (CANCER CENTER ONLY)
Abs Immature Granulocytes: 0.03 10*3/uL (ref 0.00–0.07)
Basophils Absolute: 0 10*3/uL (ref 0.0–0.1)
Basophils Relative: 1 %
Eosinophils Absolute: 0.4 10*3/uL (ref 0.0–0.5)
Eosinophils Relative: 5 %
HCT: 36.5 % — ABNORMAL LOW (ref 39.0–52.0)
Hemoglobin: 12.2 g/dL — ABNORMAL LOW (ref 13.0–17.0)
Immature Granulocytes: 0 %
Lymphocytes Relative: 9 %
Lymphs Abs: 0.7 10*3/uL (ref 0.7–4.0)
MCH: 30.4 pg (ref 26.0–34.0)
MCHC: 33.4 g/dL (ref 30.0–36.0)
MCV: 91 fL (ref 80.0–100.0)
Monocytes Absolute: 0.9 10*3/uL (ref 0.1–1.0)
Monocytes Relative: 12 %
Neutro Abs: 5.5 10*3/uL (ref 1.7–7.7)
Neutrophils Relative %: 73 %
Platelet Count: 231 10*3/uL (ref 150–400)
RBC: 4.01 MIL/uL — ABNORMAL LOW (ref 4.22–5.81)
RDW: 14.2 % (ref 11.5–15.5)
WBC Count: 7.6 10*3/uL (ref 4.0–10.5)
nRBC: 0 % (ref 0.0–0.2)

## 2022-06-08 LAB — CMP (CANCER CENTER ONLY)
ALT: 27 U/L (ref 0–44)
AST: 24 U/L (ref 15–41)
Albumin: 3.5 g/dL (ref 3.5–5.0)
Alkaline Phosphatase: 131 U/L — ABNORMAL HIGH (ref 38–126)
Anion gap: 7 (ref 5–15)
BUN: 17 mg/dL (ref 8–23)
CO2: 27 mmol/L (ref 22–32)
Calcium: 8.7 mg/dL — ABNORMAL LOW (ref 8.9–10.3)
Chloride: 101 mmol/L (ref 98–111)
Creatinine: 1.13 mg/dL (ref 0.61–1.24)
GFR, Estimated: >60 mL/min (ref >60)
Glucose, Bld: 126 mg/dL — ABNORMAL HIGH (ref 70–99)
Potassium: 3.8 mmol/L (ref 3.5–5.1)
Sodium: 135 mmol/L (ref 135–145)
Total Bilirubin: 1.7 mg/dL — ABNORMAL HIGH (ref 0.3–1.2)
Total Protein: 6.9 g/dL (ref 6.5–8.1)

## 2022-06-08 MED ORDER — SODIUM CHLORIDE 0.9 % IV SOLN
1500.0000 mg | Freq: Once | INTRAVENOUS | Status: AC
  Administered 2022-06-08: 1500 mg via INTRAVENOUS
  Filled 2022-06-08: qty 30

## 2022-06-08 MED ORDER — SODIUM CHLORIDE 0.9 % IV SOLN: Freq: Once | INTRAVENOUS | Status: AC

## 2022-06-08 NOTE — Progress Notes (Signed)
Per Dr. Julien Nordmann OK to trt with total bili 1.7 today

## 2022-06-08 NOTE — Progress Notes (Signed)
Patient seen by MD today  Vitals are within treatment parameters.  Labs reviewed: and are within treatment parameters.  Per physician team, patient is ready for treatment and there are NO modifications to the treatment plan.

## 2022-06-08 NOTE — Progress Notes (Signed)
[facility] Telephone:([phone]) [phone]   Fax:(336) [phone]  OFFICE PROGRESS NOTE  [name], MD [place] [address]  DIAGNOSIS: Stage IIIC (T3, N3, M0) non-small cell lung cancer, adenocarcinoma presented with right upper lobe lung mass status post right upper lobe wedge resection but no lymph node dissection.  The tumor measured 3.3 cm with visceral pleural involvement and very close resection margin less than 0.1 cm from the tumor.  This was performed on May 13, 2021 in the same setting of CABG under the care of Dr. Kipp Brood.  PET scan performed in March 2023 showed a right upper lobe nodule adjacent to the wedge resection site small hypermetabolic right hilar, right subcarinal, and left infrahilar lymph nodes which could be reactive but are suspicious for nodal involvement.   Molecular studies showed no actionable mutations and PD-L1 expression was 0%   PRIOR THERAPY:  1) Right upper lobe wedge resection under the care of Dr. Kipp Brood on 05/13/2021. 2) Concurrent chemoradiation with weekly carboplatin for AUC of 2 and paclitaxel 45 Mg/M2.  First dose Aug 11, 2021.  Status post 6 cycles.  Last dose was given September 22, 2021   CURRENT THERAPY: Consolidation treatment with immunotherapy with Imfinzi 1500 Mg IV every 4 weeks.  First dose October 27, 2021.  Status post 7 cycles.  INTERVAL HISTORY: Kevin [name] 82 y.o. male returns to the clinic today for follow-up visit accompanied by his wife.  The patient is feeling fine today with no concerning complaints except for the persistent cough.  He is currently on several cough medication including Tessalon and recent prescription with Hycodan by his primary care physician.  He has no significant shortness of breath except with the cough.  He has no chest pain or hemoptysis.  He has no nausea, vomiting, diarrhea or constipation.  He has no headache or visual changes.  He denied having any recent weight loss or night  sweats.  He continues to tolerate his treatment with immunotherapy fairly well.  He is here for evaluation before starting cycle #8.  MEDICAL HISTORY: Past Medical History:  Diagnosis Date   Arthritis    Coronary artery disease    Hyperlipidemia    SCC (squamous cell carcinoma) 05/28/2004   top of left hand curet x3 38f   SCC (squamous cell carcinoma) 09/13/2013   mid back tx cx3 564f  SCC (squamous cell carcinoma) 09/06/2018   left wrist tx with bx   Squamous cell carcinoma of skin 11/16/2003   left ear tip tx curet and cautery [id number]  ALLERGIES:  has No Known Allergies.  MEDICATIONS:  Current Outpatient Medications  Medication Sig Dispense Refill   aspirin EC 81 MG tablet Take 81 mg by mouth daily.     atorvastatin (LIPITOR) 80 MG tablet Take 1 tablet (80 mg total) by mouth daily. 90 tablet 3   Budeson-Glycopyrrol-Formoterol (BREZTRI AEROSPHERE) 160-9-4.8 MCG/ACT AERO Inhale 2 puffs into the lungs in the morning and at bedtime. 1 each 5   Calcium Carb-Cholecalciferol (CALCIUM 600 + D PO) Take 1,200 mg by mouth daily.     Cholecalciferol (DIALYVITE VITAMIN D 5000) 125 MCG (5000 UT) capsule Take 5,000 Units by mouth daily.     metoprolol tartrate (LOPRESSOR) 25 MG tablet Take 0.5 tablets (12.5 mg total) by mouth 2 (two) times daily. 180 tablet 3   nitroGLYCERIN (NITROSTAT) 0.4 MG SL tablet Place 0.4 mg under the tongue every 5 (five) minutes  as needed for chest pain.     No current facility-administered medications for this visit.    SURGICAL HISTORY:  Past Surgical History:  Procedure Laterality Date   BRONCHIAL BIOPSY  07/22/2021   Procedure: BRONCHIAL BIOPSIES;  Surgeon: Collene Gobble, MD;  Location: [facility];  Service: Cardiopulmonary;;   BRONCHIAL BRUSHINGS  07/22/2021   Procedure: BRONCHIAL BRUSHINGS;  Surgeon: Collene Gobble, MD;  Location: [facility];  Service: Cardiopulmonary;;   BRONCHIAL NEEDLE ASPIRATION BIOPSY  07/22/2021   Procedure: BRONCHIAL NEEDLE  ASPIRATION BIOPSIES;  Surgeon: Collene Gobble, MD;  Location: [facility];  Service: Cardiopulmonary;;   CHOLECYSTECTOMY N/A 02/15/2022   Procedure: LAPAROSCOPIC CHOLECYSTECTOMY;  Surgeon: Virl Cagey, MD;  Location: [facility];  Service: General;  Laterality: N/A;   CORONARY ARTERY BYPASS GRAFT N/A 05/13/2021   Procedure: CORONARY ARTERY BYPASS GRAFTING (CABG) TIMES 3  , ON PUMP, USING LEFT INTERNAL MAMMARY ARTERY AND RIGHT GREATER SAPHENOUS VEIN HARVESTED ENDOSCOPICALLY;  Surgeon: Lajuana Matte, MD;  Location: [facility];  Service: Open Heart Surgery;  Laterality: N/A;   ENDOVEIN HARVEST OF GREATER SAPHENOUS VEIN Right 05/13/2021   Procedure: ENDOVEIN HARVEST OF GREATER SAPHENOUS VEIN;  Surgeon: Lajuana Matte, MD;  Location: [facility];  Service: Open Heart Surgery;  Laterality: Right;   epidural     back pain   IR CT SPINE LTD  06/18/2020   IR KYPHO LUMBAR INC FX REDUCE BONE BX UNI/BIL CANNULATION INC/IMAGING  06/18/2020   LEFT HEART CATH AND CORONARY ANGIOGRAPHY N/A 05/06/2021   Procedure: LEFT HEART CATH AND CORONARY ANGIOGRAPHY;  Surgeon: [name], MD;  Location: [facility];  Service: Cardiovascular;  Laterality: N/A;   LUNG BIOPSY Right 05/13/2021   Procedure: RIGHT LUNG BIOPSY;  Surgeon: Lajuana Matte, MD;  Location: [facility];  Service: Open Heart Surgery;  Laterality: Right;   MINOR HEMORRHOIDECTOMY     SHOULDER SURGERY     TEE WITHOUT CARDIOVERSION N/A 05/13/2021   Procedure: TRANSESOPHAGEAL ECHOCARDIOGRAM (TEE);  Surgeon: Lajuana Matte, MD;  Location: [facility];  Service: Open Heart Surgery;  Laterality: N/A;   TRANSURETHRAL RESECTION OF BLADDER TUMOR  2014   TRANSURETHRAL RESECTION OF BLADDER TUMOR N/A 05/19/2018   Procedure: TRANSURETHRAL RESECTION OF BLADDER TUMOR (TURBT) AND (INTRAVESICAL GEMCITABINE INSTILLED IN BLADDER IN PACU) ;  Surgeon: Franchot Gallo, MD;  Location: [facility];  Service: Urology;  Laterality: N/A;  [facility] Bilateral 07/22/2021   Procedure: VIDEO BRONCHOSCOPY WITH ENDOBRONCHIAL ULTRASOUND;  Surgeon: Collene Gobble, MD;  Location: [facility];  Service: Cardiopulmonary;  Laterality: Bilateral;    REVIEW OF SYSTEMS:  A comprehensive review of systems was negative except for: Respiratory: positive for cough   PHYSICAL EXAMINATION: General appearance: alert, cooperative, and no distress Head: Normocephalic, without obvious abnormality, atraumatic Neck: no adenopathy, no JVD, supple, symmetrical, trachea midline, and thyroid not enlarged, symmetric, no tenderness/mass/nodules Lymph nodes: Cervical, supraclavicular, and axillary nodes normal. Resp: clear to auscultation bilaterally Back: symmetric, no curvature. ROM normal. No CVA tenderness. Cardio: regular rate and rhythm, S1, S2 normal, no murmur, click, rub or gallop GI: soft, non-tender; bowel sounds normal; no masses,  no organomegaly Extremities: extremities normal, atraumatic, no cyanosis or edema  ECOG PERFORMANCE STATUS: 0 - Asymptomatic  Blood pressure (!) 127/59, pulse 70, temperature 97.7 F (36.5 C), temperature source Oral, resp. rate 16, weight 168 lb 4.8 oz (76.3 kg), SpO2 98 %.  LABORATORY DATA: Lab Results  Component  Value Date   WBC 5.8 04/14/2022   HGB 11.9 (L) 04/14/2022   HCT 35.7 (L) 04/14/2022   MCV 91.8 04/14/2022   PLT 165 04/14/2022      Chemistry      Component Value Date/Time   NA 138 04/14/2022 0848   NA 139 05/01/2021 1146   K 3.9 04/14/2022 0848   CL 105 04/14/2022 0848   CO2 26 04/14/2022 0848   BUN 16 04/14/2022 0848   BUN 20 05/01/2021 1146   CREATININE 1.24 04/14/2022 0848      Component Value Date/Time   CALCIUM 9.2 04/14/2022 0848   ALKPHOS 79 04/14/2022 0848   AST 12 (L) 04/14/2022 0848   ALT 10 04/14/2022 0848   BILITOT 1.0 04/14/2022 0848       RADIOGRAPHIC STUDIES: No results found.  ASSESSMENT AND PLAN: This is a very pleasant 82 years old white male  diagnosed with stage IIIC (T3, N3, M0) non-small cell lung cancer, adenocarcinoma with no actionable mutations and PD-L1 expression of 0% diagnosed in January 2023 status post wedge resection of the right upper lobe lung mass in a setting of CABG under the care of Dr. Kipp Brood but the PET scan showed residual disease in the right upper lobe in addition to hypermetabolic right hilar, subcarinal and left infrahilar lymph nodes. The patient was seen by Dr. Lamonte Sakai and biopsy of the right upper lobe as well as the subcarinal and left hilar lymph nodes were positive for malignancy consistent with non-small cell lung cancer, adenocarcinoma. He underwent a course of concurrent chemoradiation with weekly carboplatin for AUC of 2 and paclitaxel 45 Mg/M2 status post 6 cycles.  The patient tolerated this previous course of concurrent chemoradiation fairly well except for fatigue and mild odynophagia. The patient is currently undergoing consolidation treatment with immunotherapy with Imfinzi 1500 Mg IV every 4 weeks status post 7 cycles.    The patient has been tolerating this treatment well with no concerning adverse effects. I recommended for him to proceed with cycle #8 today as planned. For the cough he will continue with Hycodan and Tessalon and if there is worsening of his cough or shortness of breath, I may discontinue his treatment with Imfinzi at some point.  Most of his cough is probably related to his recent infection with RSV. The patient was advised to call immediately if he has any other concerning symptoms in the interval. The patient voices understanding of current disease status and treatment options and is in agreement with the current care plan. All questions were answered. The patient knows to call the clinic with any problems, questions or concerns. We can certainly see the patient much sooner if necessary. The total time spent in the appointment was 20 minutes.   Disclaimer: This note was  dictated with voice recognition software. Similar sounding words can inadvertently be transcribed and may not be corrected upon review.

## 2022-06-08 NOTE — Patient Instructions (Signed)
[facility]  Discharge Instructions: Thank you for choosing [facility] to provide your oncology and hematology care.   If you have a lab appointment with the [facility], please go directly to the [facility] and check in at the registration area.   Wear comfortable clothing and clothing appropriate for easy access to any Portacath or PICC line.   We strive to give you quality time with your provider. You may need to reschedule your appointment if you arrive late (15 or more minutes).  Arriving late affects you and other patients whose appointments are after yours.  Also, if you miss three or more appointments without notifying the office, you may be dismissed from the clinic at the provider's discretion.      For prescription refill requests, have your pharmacy contact our office and allow 72 hours for refills to be completed.    Today you received the following chemotherapy and/or immunotherapy agents imfinzi      To help prevent nausea and vomiting after your treatment, we encourage you to take your nausea medication as directed.  BELOW ARE SYMPTOMS THAT SHOULD BE REPORTED IMMEDIATELY: *FEVER GREATER THAN 100.4 F (38 C) OR HIGHER *CHILLS OR SWEATING *NAUSEA AND VOMITING THAT IS NOT CONTROLLED WITH YOUR NAUSEA MEDICATION *UNUSUAL SHORTNESS OF BREATH *UNUSUAL BRUISING OR BLEEDING *URINARY PROBLEMS (pain or burning when urinating, or frequent urination) *BOWEL PROBLEMS (unusual diarrhea, constipation, pain near the anus) TENDERNESS IN MOUTH AND THROAT WITH OR WITHOUT PRESENCE OF ULCERS (sore throat, sores in mouth, or a toothache) UNUSUAL RASH, SWELLING OR PAIN  UNUSUAL VAGINAL DISCHARGE OR ITCHING   Items with * indicate a potential emergency and should be followed up as soon as possible or go to the Emergency Department if any problems should occur.  Please show the CHEMOTHERAPY ALERT CARD or IMMUNOTHERAPY ALERT CARD at check-in  to the Emergency Department and triage nurse.  Should you have questions after your visit or need to cancel or reschedule your appointment, please contact [facility]  Dept: [phone]  and follow the prompts.  Office hours are 8:00 a.m. to 4:30 p.m. Monday - Friday. Please note that voicemails left after 4:00 p.m. may not be returned until the following business day.  We are closed weekends and major holidays. You have access to a nurse at all times for urgent questions. Please call the main number to the clinic Dept: [phone] and follow the prompts.   For any non-urgent questions, you may also contact your provider using MyChart. We now offer e-Visits for anyone 36 and older to request care online for non-urgent symptoms. For details visit [email or web address].   Also download the MyChart app! Go to the app store, search "MyChart", open the app, select [facility], and log in with your MyChart username and password.

## 2022-06-08 NOTE — Progress Notes (Addendum)
Patient seen by MD today  Vitals are within treatment parameters.  Labs reviewed: are not within treatment parameters-Per Dr. Julien Nordmann it is ok to treat pt today with Durvalumab and bilirubin of 1.7.  Per physician team, patient is ready for treatment and there are NO modifications to the treatment plan.

## 2022-06-22 DIAGNOSIS — Z1339 Encounter for screening examination for other mental health and behavioral disorders: Secondary | ICD-10-CM | POA: Diagnosis not present

## 2022-06-22 DIAGNOSIS — Z1331 Encounter for screening for depression: Secondary | ICD-10-CM | POA: Diagnosis not present

## 2022-06-22 DIAGNOSIS — Z299 Encounter for prophylactic measures, unspecified: Secondary | ICD-10-CM | POA: Diagnosis not present

## 2022-06-22 DIAGNOSIS — I10 Essential (primary) hypertension: Secondary | ICD-10-CM | POA: Diagnosis not present

## 2022-06-22 DIAGNOSIS — Z7189 Other specified counseling: Secondary | ICD-10-CM | POA: Diagnosis not present

## 2022-06-22 DIAGNOSIS — Z0000 Encounter for general adult medical examination without abnormal findings: Secondary | ICD-10-CM | POA: Diagnosis not present

## 2022-06-29 ENCOUNTER — Ambulatory Visit (INDEPENDENT_AMBULATORY_CARE_PROVIDER_SITE_OTHER): Payer: Medicare HMO | Admitting: Pulmonary Disease

## 2022-06-29 ENCOUNTER — Encounter: Payer: Self-pay | Admitting: Pulmonary Disease

## 2022-06-29 VITALS — BP 120/70 | HR 72 | Ht 71.0 in | Wt 163.4 lb

## 2022-06-29 DIAGNOSIS — Z951 Presence of aortocoronary bypass graft: Secondary | ICD-10-CM | POA: Diagnosis not present

## 2022-06-29 DIAGNOSIS — J449 Chronic obstructive pulmonary disease, unspecified: Secondary | ICD-10-CM

## 2022-06-29 DIAGNOSIS — R053 Chronic cough: Secondary | ICD-10-CM | POA: Diagnosis not present

## 2022-06-29 DIAGNOSIS — C3491 Malignant neoplasm of unspecified part of right bronchus or lung: Secondary | ICD-10-CM | POA: Diagnosis not present

## 2022-06-29 DIAGNOSIS — Z923 Personal history of irradiation: Secondary | ICD-10-CM

## 2022-06-29 DIAGNOSIS — Z902 Acquired absence of lung [part of]: Secondary | ICD-10-CM | POA: Diagnosis not present

## 2022-06-29 MED ORDER — BENZONATATE 200 MG PO CAPS: 200.0000 mg | ORAL_CAPSULE | Freq: Three times a day (TID) | ORAL | 2 refills | Status: DC | PRN

## 2022-06-29 NOTE — Progress Notes (Signed)
Synopsis: Referred in April 2023 for adenopathy by [name], MD  Subjective:   PATIENT ID: Kevin [name] GENDER: male DOB: 01-29-1941, MRN: [id number]  Chief Complaint  Patient presents with   Follow-up    F/up cough    This is a 82 year old gentleman, Past medical history of coronary artery bypass grafting by Dr. Kipp Brood, was found to have a lung mass at the time of his surgical evaluation.  He had a Wedge resection of the right upper lobe which was consistent with malignancy.  Patient has establish care with medical oncology.  Last seen in the office on 07/09/2021, medical oncology office note reviewed.  Patient diagnosed with a stage IIIb T3, N2M0 non-small cell lung cancer, adenocarcinoma of the right upper lobe tumor at 3.3 cm with visceral pleural involvement.  PET scan concerning for nodal disease.  There was multidisciplinary discussion regarding his case last week at medical thoracic oncology conference.  Nodal disease potentially within the mediastinum and hilum but also possibly considered reactive.  At this changes the plan for consideration for radiation treatment which could be definitive to the upper lobe lesion versus getting more mediastinal radiation.  Radiation oncology was concerned about giving treatment to the mediastinum with a recent sternotomy.  However the patient is still on Plavix.  Patient was referred to me for consideration of videobronchoscope with endobronchial ultrasound of the small lymph nodes to confirm nodal disease.  OV 08/11/2021: Here today for follow-up after biopsy.Patient underwent video bronchoscopy with endobronchial ultrasound and nodal sampling by Dr. Lamonte Sakai. he is not using any inhalers at this time.  From a respiratory standpoint he is able to complete all of his activities of daily living.  He has a trip planned in September.  He is starting his chemoradiation next week.  OV 04/03/2022: Here today for follow-up.  Initially was seen earlier in  the year after bronchoscopy.  Was diagnosed with stage IIIb non-small cell lung cancer.  He is here today complaining of ongoing cough.  He is underwent radiation treatments to the chest and currently undergoing immunotherapy treatments with Dr. Earlie Server.  Recent CT scan of the chest reviewed with patient.  As areas of scarring from previous surgery and radiation.  Also has significant paraseptal emphysema.  Currently only using Symbicort.  Up until the past couple of weeks it only been using it as needed.  Now using it more regularly.  He is sleeping better.  Thinks this may be related to the initiation of his inhaler use.  Does not have a nebulizer at home.  OV 06/29/2022: Here to see me for follow-up.  Last seen in the office in January 2024 by TP, NP.  He was diagnosed with RSV this past season and has been on prednisone taper for short while.  From respiratory standpoint he is doing okay.  He does have dry cough.  This is relieved some with use of his inhalers.  He is trying to a use his Breztri daily.  He also has some relief with Tessalon Perles and cough drops.  He is not really sure that Mucinex has made much difference.  His cough is predominantly dry.  We reviewed CT imaging that he completed in January.     Past Medical History:  Diagnosis Date   Arthritis    Coronary artery disease    Hyperlipidemia    SCC (squamous cell carcinoma) 05/28/2004   top of left hand curet x3 82fu   SCC (squamous cell carcinoma) 09/13/2013  mid back tx cx3 17fu   SCC (squamous cell carcinoma) 09/06/2018   left wrist tx with bx   Squamous cell carcinoma of skin 11/16/2003   left ear tip tx curet and cautery 63fu     Family History  Problem Relation Age of Onset   Alzheimer's disease Mother 70   Congestive Heart Failure Father 12   Heart disease Brother 68   Heart failure Brother 17   Heart failure Brother 74     Past Surgical History:  Procedure Laterality Date   BRONCHIAL BIOPSY  07/22/2021    Procedure: BRONCHIAL BIOPSIES;  Surgeon: Collene Gobble, MD;  Location: [place];  Service: Cardiopulmonary;;   BRONCHIAL BRUSHINGS  07/22/2021   Procedure: BRONCHIAL BRUSHINGS;  Surgeon: Collene Gobble, MD;  Location: [facility];  Service: Cardiopulmonary;;   BRONCHIAL NEEDLE ASPIRATION BIOPSY  07/22/2021   Procedure: BRONCHIAL NEEDLE ASPIRATION BIOPSIES;  Surgeon: Collene Gobble, MD;  Location: [facility];  Service: Cardiopulmonary;;   CHOLECYSTECTOMY N/A 02/15/2022   Procedure: LAPAROSCOPIC CHOLECYSTECTOMY;  Surgeon: Virl Cagey, MD;  Location: [facility];  Service: General;  Laterality: N/A;   CORONARY ARTERY BYPASS GRAFT N/A 05/13/2021   Procedure: CORONARY ARTERY BYPASS GRAFTING (CABG) TIMES 3  , ON PUMP, USING LEFT INTERNAL MAMMARY ARTERY AND RIGHT GREATER SAPHENOUS VEIN HARVESTED ENDOSCOPICALLY;  Surgeon: Lajuana Matte, MD;  Location: [facility];  Service: Open Heart Surgery;  Laterality: N/A;   ENDOVEIN HARVEST OF GREATER SAPHENOUS VEIN Right 05/13/2021   Procedure: ENDOVEIN HARVEST OF GREATER SAPHENOUS VEIN;  Surgeon: Lajuana Matte, MD;  Location: [place];  Service: Open Heart Surgery;  Laterality: Right;   epidural     back pain   IR CT SPINE LTD  06/18/2020   IR KYPHO LUMBAR INC FX REDUCE BONE BX UNI/BIL CANNULATION INC/IMAGING  06/18/2020   LEFT HEART CATH AND CORONARY ANGIOGRAPHY N/A 05/06/2021   Procedure: LEFT HEART CATH AND CORONARY ANGIOGRAPHY;  Surgeon: [name], MD;  Location: [facility];  Service: Cardiovascular;  Laterality: N/A;   LUNG BIOPSY Right 05/13/2021   Procedure: RIGHT LUNG BIOPSY;  Surgeon: Lajuana Matte, MD;  Location: [place];  Service: Open Heart Surgery;  Laterality: Right;   MINOR HEMORRHOIDECTOMY     SHOULDER SURGERY     TEE WITHOUT CARDIOVERSION N/A 05/13/2021   Procedure: TRANSESOPHAGEAL ECHOCARDIOGRAM (TEE);  Surgeon: Lajuana Matte, MD;  Location: [facility];  Service: Open Heart Surgery;  Laterality: N/A;   TRANSURETHRAL  RESECTION OF BLADDER TUMOR  2014   TRANSURETHRAL RESECTION OF BLADDER TUMOR N/A 05/19/2018   Procedure: TRANSURETHRAL RESECTION OF BLADDER TUMOR (TURBT) AND (INTRAVESICAL GEMCITABINE INSTILLED IN BLADDER IN PACU) ;  Surgeon: Franchot Gallo, MD;  Location: [facility];  Service: Urology;  Laterality: N/A;  [place] Bilateral 07/22/2021   Procedure: VIDEO BRONCHOSCOPY WITH ENDOBRONCHIAL ULTRASOUND;  Surgeon: Collene Gobble, MD;  Location[facility];  Service: Cardiopulmonary;  Laterality: Bilateral;    Social History   Socioeconomic History   Marital status: Married    Spouse name: Not on file   Number of children: 2   Years of education: Not on file   Highest education level: Not on file  Occupational History   Not on file  Tobacco Use   Smoking status: Former    Packs/day: 1.00    Years: 30.00    Additional pack years: 0.00    Total pack years: 30.00    Types:  Cigarettes    Quit date: 04/14/1995    Years since quitting: 27.2   Smokeless tobacco: Never  Vaping Use   Vaping Use: Never used  Substance and Sexual Activity   Alcohol use: Never   Drug use: Never   Sexual activity: Not on file  Other Topics Concern   Not on file  Social History Narrative   Not on file   Social Determinants of Health   Financial Resource Strain: Not on file  Food Insecurity: No Food Insecurity (02/14/2022)   Hunger Vital Sign    Worried About Running Out of Food in the Last Year: Never true    Ran Out of Food in the Last Year: Never true  Transportation Needs: No Transportation Needs (02/14/2022)   PRAPARE - Hydrologist (Medical): No    Lack of Transportation (Non-Medical): No  Physical Activity: Not on file  Stress: Not on file  Social Connections: Not on file  Intimate Partner Violence: Not At Risk (02/14/2022)   Humiliation, Afraid, Rape, and Kick questionnaire    Fear of Current or Ex-Partner: No     Emotionally Abused: No    Physically Abused: No    Sexually Abused: No     No Known Allergies   Outpatient Medications Prior to Visit  Medication Sig Dispense Refill   acetaminophen (TYLENOL) 500 MG tablet Take 500 mg by mouth daily as needed for moderate pain, fever or headache.     aspirin EC 81 MG tablet Take 81 mg by mouth daily.     atorvastatin (LIPITOR) 80 MG tablet Take 1 tablet (80 mg total) by mouth daily. 90 tablet 3   Budeson-Glycopyrrol-Formoterol (BREZTRI AEROSPHERE) 160-9-4.8 MCG/ACT AERO Inhale 2 puffs into the lungs in the morning and at bedtime. 1 each 5   Cholecalciferol (DIALYVITE VITAMIN D 5000) 125 MCG (5000 UT) capsule Take 5,000 Units by mouth daily.     HYDROcodone bit-homatropine (HYCODAN) 5-1.5 MG/5ML syrup Take 5 mLs by mouth every 6 (six) hours as needed for cough. 120 mL 0   metoprolol tartrate (LOPRESSOR) 25 MG tablet Take 0.5 tablets (12.5 mg total) by mouth 2 (two) times daily. (Patient taking differently: Take 12.5 mg by mouth 2 (two) times daily with breakfast and lunch.) 180 tablet 3   nitroGLYCERIN (NITROSTAT) 0.4 MG SL tablet Place 0.4 mg under the tongue every 5 (five) minutes as needed for chest pain.     benzonatate (TESSALON) 200 MG capsule Take 1 capsule (200 mg total) by mouth 3 (three) times daily as needed. 45 capsule 2   albuterol (PROVENTIL) (2.5 MG/3ML) 0.083% nebulizer solution Take 3 mLs (2.5 mg total) by nebulization every 6 (six) hours as needed for wheezing or shortness of breath. (Patient not taking: Reported on 05/08/2022) 360 mL 11   predniSONE (DELTASONE) 10 MG tablet Take 10 mg by mouth 2 (two) times daily. (Patient not taking: Reported on 06/29/2022)     No facility-administered medications prior to visit.    Review of Systems  Constitutional:  Negative for chills, fever, malaise/fatigue and weight loss.  HENT:  Negative for hearing loss, sore throat and tinnitus.   Eyes:  Negative for blurred vision and double vision.   Respiratory:  Positive for cough. Negative for hemoptysis, sputum production, shortness of breath, wheezing and stridor.   Cardiovascular:  Negative for chest pain, palpitations, orthopnea, leg swelling and PND.  Gastrointestinal:  Negative for abdominal pain, constipation, diarrhea, heartburn, nausea and vomiting.  Genitourinary:  Negative  for dysuria, hematuria and urgency.  Musculoskeletal:  Negative for joint pain and myalgias.  Skin:  Negative for itching and rash.  Neurological:  Negative for dizziness, tingling, weakness and headaches.  Endo/Heme/Allergies:  Negative for environmental allergies. Does not bruise/bleed easily.  Psychiatric/Behavioral:  Negative for depression. The patient is not nervous/anxious and does not have insomnia.   All other systems reviewed and are negative.    Objective:  Physical Exam Vitals reviewed.  Constitutional:      General: He is not in acute distress.    Appearance: He is well-developed.  HENT:     Head: Normocephalic and atraumatic.  Eyes:     General: No scleral icterus.    Conjunctiva/sclera: Conjunctivae normal.     Pupils: Pupils are equal, round, and reactive to light.  Neck:     Vascular: No JVD.     Trachea: No tracheal deviation.  Cardiovascular:     Rate and Rhythm: Normal rate and regular rhythm.     Heart sounds: Normal heart sounds. No murmur heard. Pulmonary:     Effort: Pulmonary effort is normal. No tachypnea, accessory muscle usage or respiratory distress.     Breath sounds: No stridor. Rhonchi present. No wheezing or rales.  Abdominal:     General: There is no distension.     Palpations: Abdomen is soft.     Tenderness: There is no abdominal tenderness.  Musculoskeletal:        General: No tenderness.     Cervical back: Neck supple.  Lymphadenopathy:     Cervical: No cervical adenopathy.  Skin:    General: Skin is warm and dry.     Capillary Refill: Capillary refill takes less than 2 seconds.     Findings: No  rash.  Neurological:     Mental Status: He is alert and oriented to person, place, and time.  Psychiatric:        Behavior: Behavior normal.      Vitals:   06/29/22 1026  BP: 120/70  Pulse: 72  SpO2: 96%  Weight: 163 lb 6.4 oz (74.1 kg)  Height: 5\' 11"  (1.803 m)   96% on RA BMI Readings from Last 3 Encounters:  06/29/22 22.79 kg/m  06/08/22 22.67 kg/m  05/11/22 22.79 kg/m   Wt Readings from Last 3 Encounters:  06/29/22 163 lb 6.4 oz (74.1 kg)  06/08/22 162 lb 9 oz (73.7 kg)  05/11/22 163 lb 6.4 oz (74.1 kg)     CBC    Component Value Date/Time   WBC 7.6 06/08/2022 0900   WBC 4.1 05/03/2022 1656   RBC 4.01 (L) 06/08/2022 0900   HGB 12.2 (L) 06/08/2022 0900   HGB 14.5 05/01/2021 1146   HCT 36.5 (L) 06/08/2022 0900   HCT 41.2 05/01/2021 1146   PLT 231 06/08/2022 0900   PLT 236 05/01/2021 1146   MCV 91.0 06/08/2022 0900   MCV 90 05/01/2021 1146   MCH 30.4 06/08/2022 0900   MCHC 33.4 06/08/2022 0900   RDW 14.2 06/08/2022 0900   RDW 11.8 05/01/2021 1146   LYMPHSABS 0.7 06/08/2022 0900   MONOABS 0.9 06/08/2022 0900   EOSABS 0.4 06/08/2022 0900   BASOSABS 0.0 06/08/2022 0900     Chest Imaging: 07/07/2021 nuclear medicine pet imaging: Hypermetabolic adenopathy within the mediastinum, right upper lobe hypermetabolic lesion. Concerning for advanced age bronchogenic carcinoma. The patient's images have been independently reviewed by me.    CT chest October 2023: Right upper lobe pulmonary nodule adjacent to right  upper lobe wedge resection relatively unchanged.  Has some linear areas of groundglass opacity consistent with his radiation changes.  Mild inflammatory changes within the left lower lobe.  No other new nodules,.  No evidence of metastatic disease.  CT chest January 2024: Areas consistent with postradiation bronchiectasis and scarring. Evidence of paraseptal and centrilobular emphysema. The patient's images have been independently reviewed by me.     Pulmonary Functions Testing Results:     No data to display          FeNO:   Pathology:   Echocardiogram:   Heart Catheterization:     Assessment & Plan:     ICD-10-CM   1. Chronic cough  R05.3     2. Adenocarcinoma of right lung, stage III (HCC)  C34.91     3. Chronic obstructive pulmonary disease, unspecified COPD type ([place])  J44.9     4. History of radiation therapy  Z92.3     5. Status post lobectomy of lung  Z90.2     6. S/P CABG x 3  Z95.1       Discussion:  82 year old gentleman, stage III adenocarcinoma of the lung status post chemo and radiation.  Currently undergoing immunotherapy.  Reviewed CT of the chest which was complete in January.  He has plenty of reason for chronic cough.  He has evidence of parenchymal disease and scarring related to his radiation as well as significant centrilobular and paraseptal emphysema.  Plan: Would recommend that he continue the use of his Breztri. Continue as needed cough medications. I do not suspect that we will ever really be able to get rid of his cough with his parenchymal disease that he has in his lung. Will continue to manage it the best of our ability. Continue immune therapy. Can follow-up with [place] in 1 year or as needed. If his cough gets worse or he has exacerbations of gout we try to treat these with antibiotics and steroids to try to limit it. He is at risk for colonization of the airway due to his structural disease.  And we may need to consider sputum productions if his cough ever becomes productive but at this time its dry and nonproductive. Continue surveillance imaging as ordered by medical oncology. New refills given for [facility].  RTC in 1 year or as needed.   Current Outpatient Medications:    acetaminophen (TYLENOL) 500 MG tablet, Take 500 mg by mouth daily as needed for moderate pain, fever or headache., Disp: , Rfl:    aspirin EC 81 MG tablet, Take 81 mg by mouth daily., Disp: , Rfl:     atorvastatin (LIPITOR) 80 MG tablet, Take 1 tablet (80 mg total) by mouth daily., Disp: 90 tablet, Rfl: 3   Budeson-Glycopyrrol-Formoterol (BREZTRI AEROSPHERE) 160-9-4.8 MCG/ACT AERO, Inhale 2 puffs into the lungs in the morning and at bedtime., Disp: 1 each, Rfl: 5   Cholecalciferol (DIALYVITE VITAMIN D 5000) 125 MCG (5000 UT) capsule, Take 5,000 Units by mouth daily., Disp: , Rfl:    HYDROcodone bit-homatropine (HYCODAN) 5-1.5 MG/5ML syrup, Take 5 mLs by mouth every 6 (six) hours as needed for cough., Disp: 120 mL, Rfl: 0   metoprolol tartrate (LOPRESSOR) 25 MG tablet, Take 0.5 tablets (12.5 mg total) by mouth 2 (two) times daily. (Patient taking differently: Take 12.5 mg by mouth 2 (two) times daily with breakfast and lunch.), Disp: 180 tablet, Rfl: 3   nitroGLYCERIN (NITROSTAT) 0.4 MG SL tablet, Place 0.4 mg under the tongue every  5 (five) minutes as needed for chest pain., Disp: , Rfl:    albuterol (PROVENTIL) (2.5 MG/3ML) 0.083% nebulizer solution, Take 3 mLs (2.5 mg total) by nebulization every 6 (six) hours as needed for wheezing or shortness of breath. (Patient not taking: Reported on 05/08/2022), Disp: 360 mL, Rfl: 11   benzonatate (TESSALON) 200 MG capsule, Take 1 capsule (200 mg total) by mouth 3 (three) times daily as needed., Disp: 45 capsule, Rfl: 2   predniSONE (DELTASONE) 10 MG tablet, Take 10 mg by mouth 2 (two) times daily. (Patient not taking: Reported on 06/29/2022), Disp: , Rfl:    Garner Nash, DO  Pulmonary Critical Care 06/29/2022 11:03 AM

## 2022-06-29 NOTE — Patient Instructions (Signed)
Thank you for visiting Dr. Valeta Harms at [facility]. Today we recommend the following:  Continue current inhaler regimen  Continue mucinex as needed   Return in about 1 year (around 06/29/2023) for with APP or Dr. Valeta Harms.    Please do your part to reduce the spread of COVID-19.

## 2022-07-02 IMAGING — DX DG CHEST 2V
2 series · 2 of 2 positions shown · non-contrast
Comparison: None.

CLINICAL DATA: Dyspnea.  Preop for CABG.

EXAM:
CHEST - 2 VIEW

[chest pa]
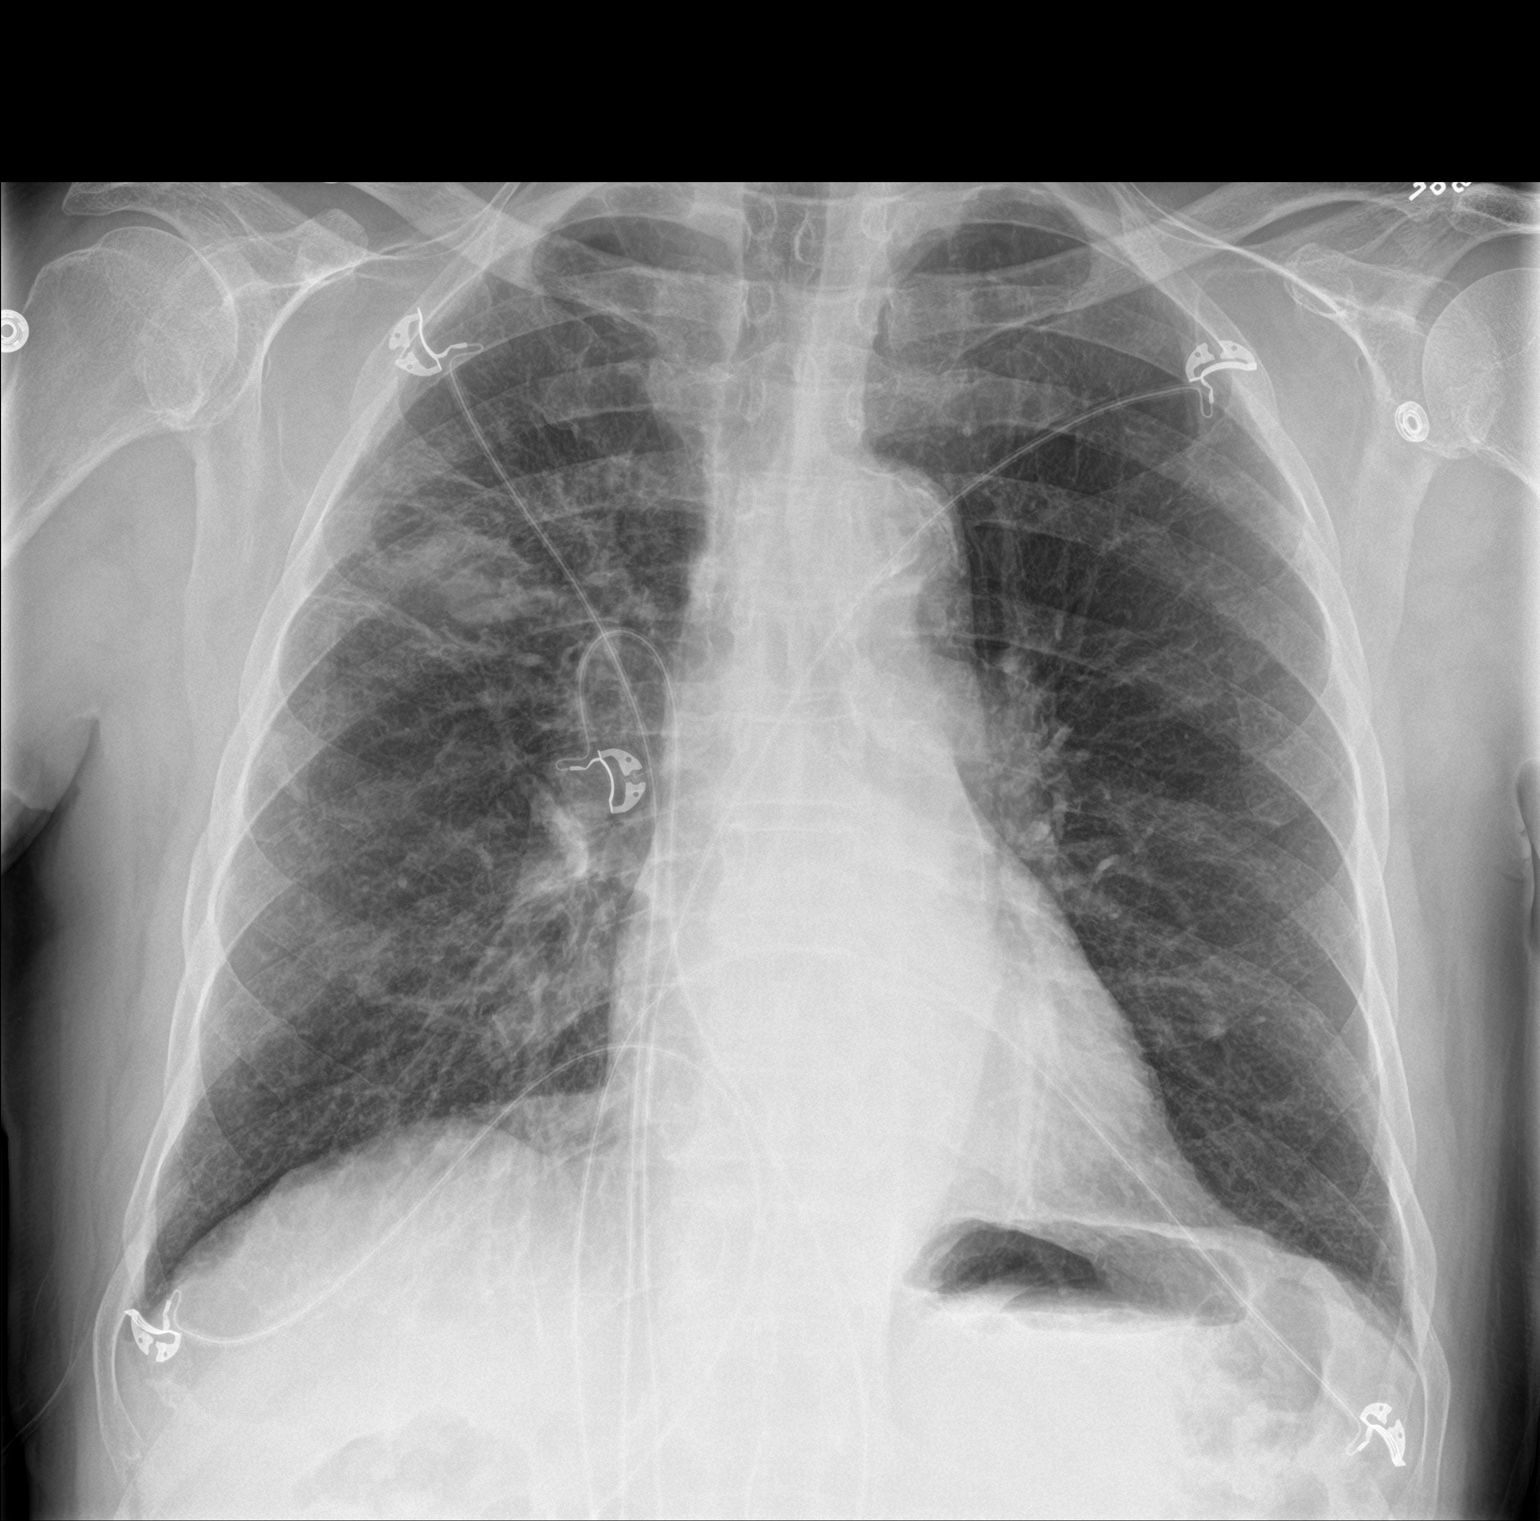

[chest lat]
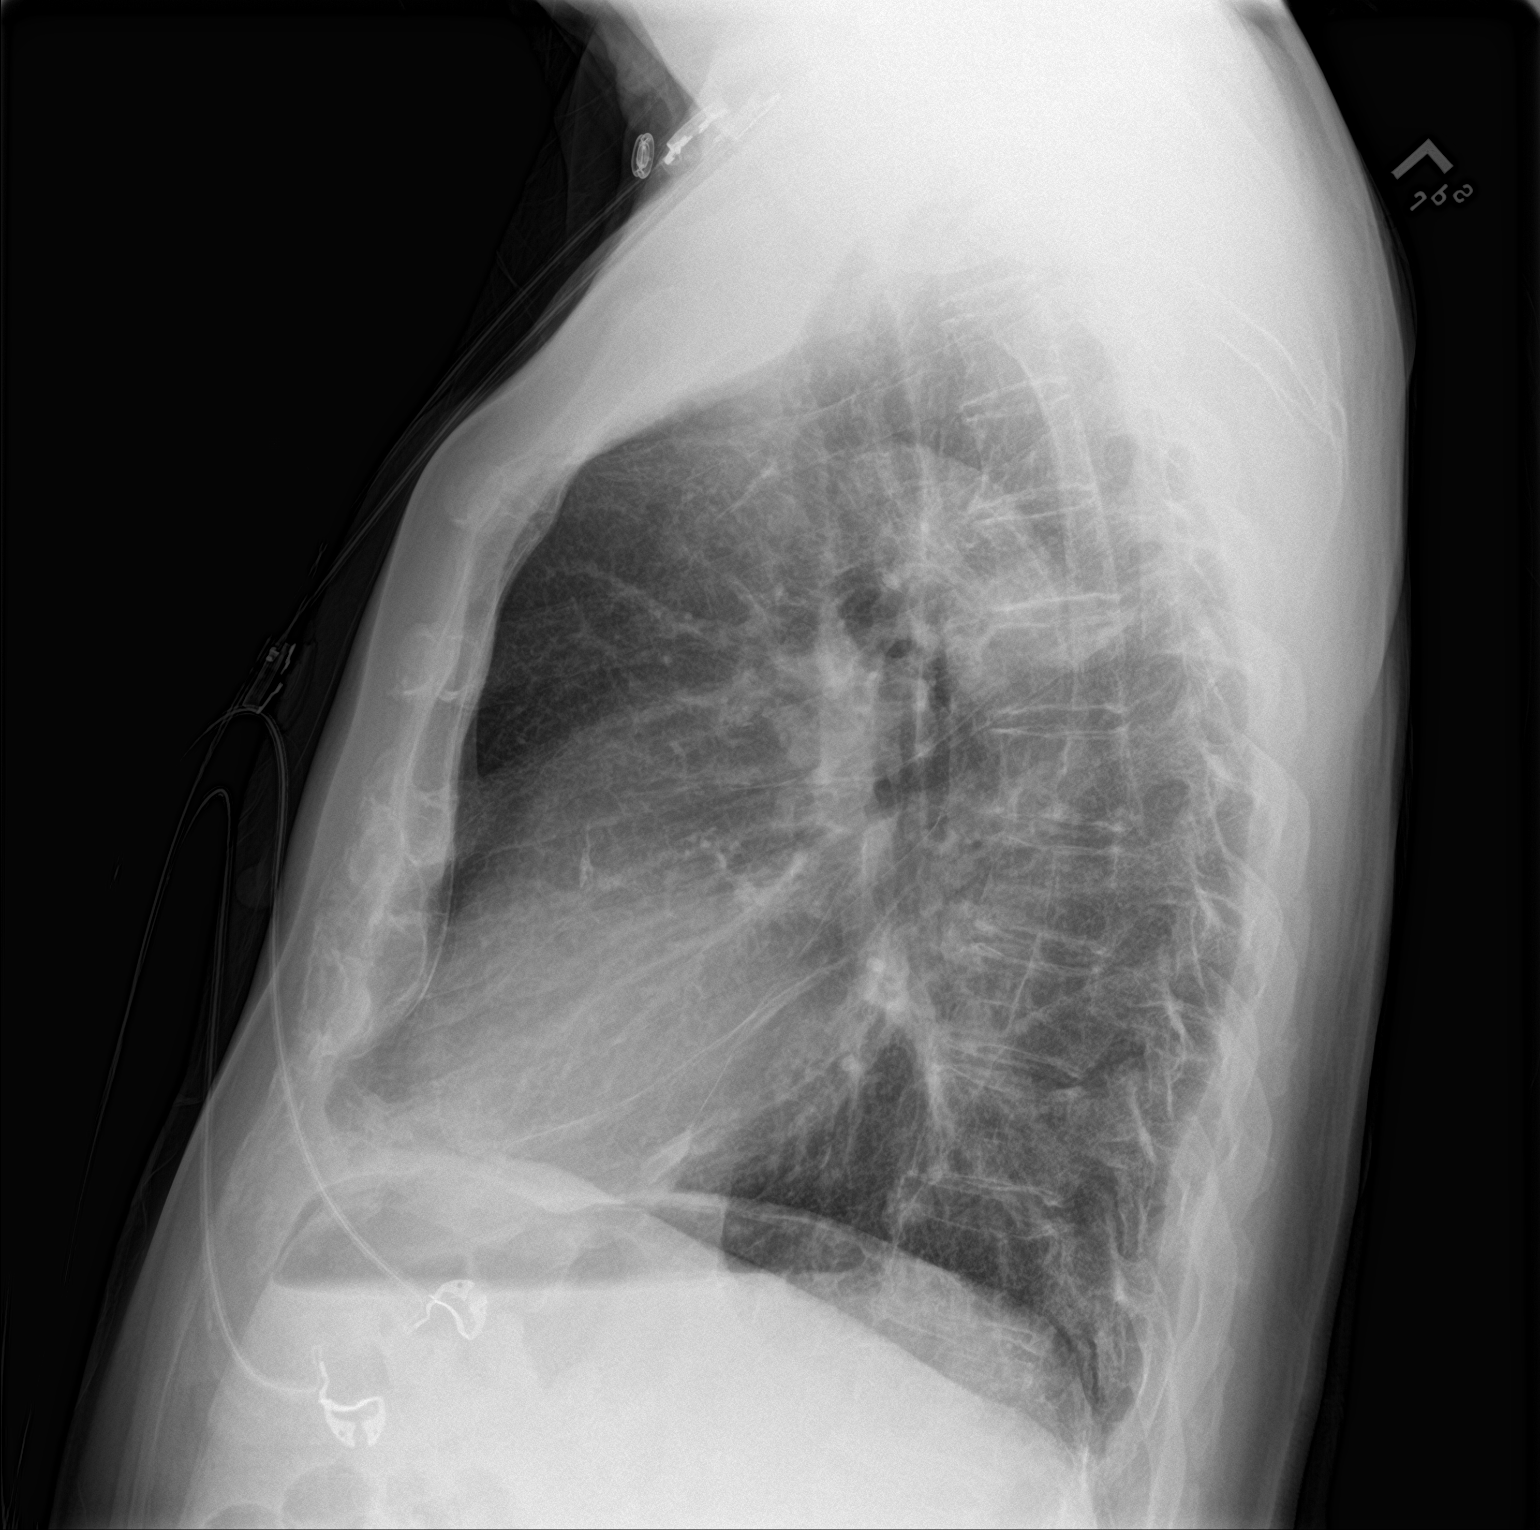

[2 of 2 positions shown; findings below may reference images not displayed]

FINDINGS: A 4.5 cm opacity is present posteriorly and superiorly in the right
lung. This may be along the major fissure. Left lung is clear. Heart
size is normal. Atherosclerotic calcifications are present at the
arch. Bony thorax is unremarkable.
IMPRESSION: 1. 4.5 cm opacity posteriorly and superiorly in the right lung.
While this may represent atelectasis, infection or malignancy is not
excluded. Recommend CT chest with contrast for further evaluation.
2. Aortic atherosclerosis

These results will be called to the ordering clinician or
representative by the Radiologist Assistant, and communication
documented in the PACS or [REDACTED].

## 2022-07-02 IMAGING — CT CT CHEST W/ CM
2 of 4 series · 15 of 36 positions shown, 18 images · IV contrast (APPLIED)
Comparison: Chest radiograph dated 05/07/2021.

CLINICAL DATA: Right upper lobe mass.

EXAM:
CT CHEST WITH CONTRAST
TECHNIQUE: Multidetector CT imaging of the chest was performed during
intravenous contrast administration.

[Series 3: chest w · axial · 0.84mm/px · z∈[+1028,+1338]mm · 12 of 185 slices shown, 15 images]
[im 15/185  mediastinal]
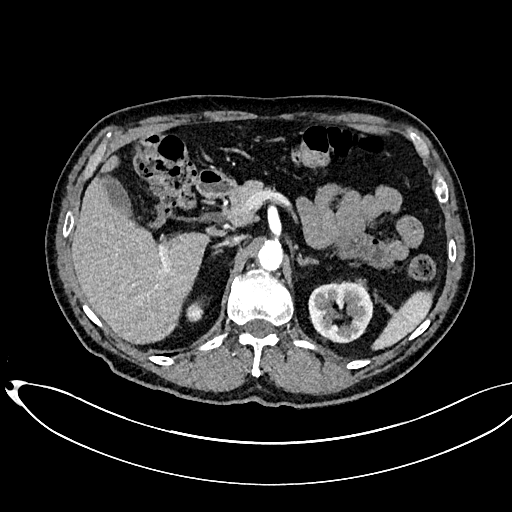
[im 15/185  lung]
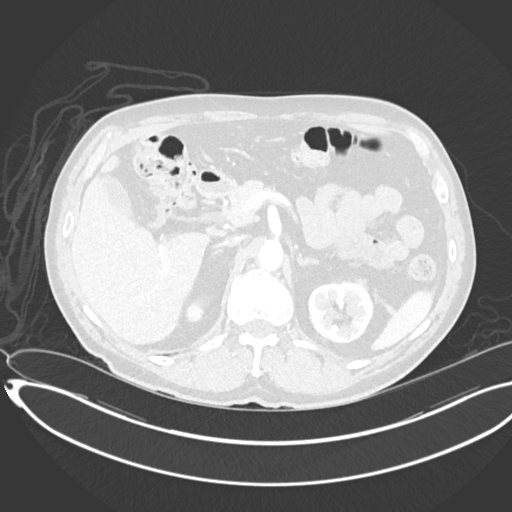
[im 29/185  lung]
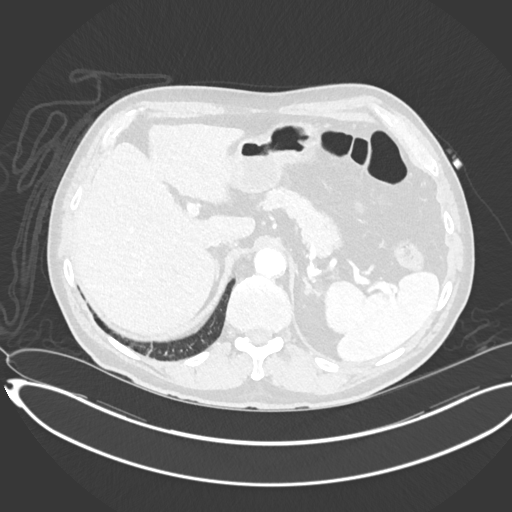
[im 43/185  lung]
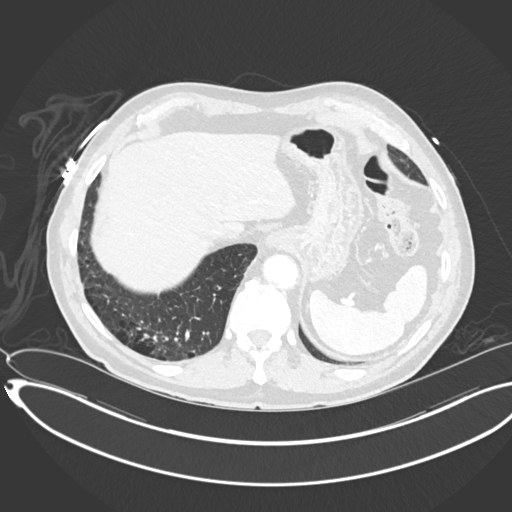
[im 57/185  lung]
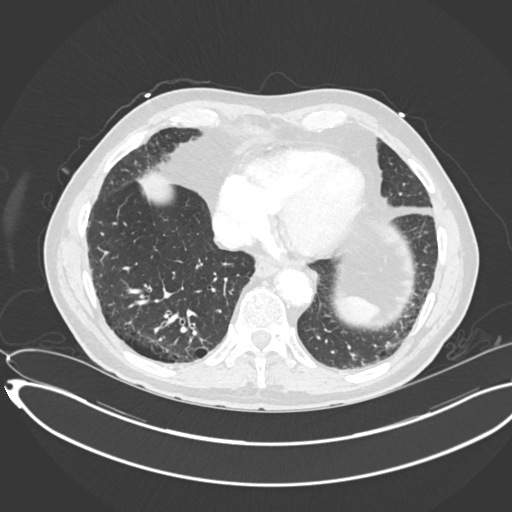
[im 71/185  mediastinal]
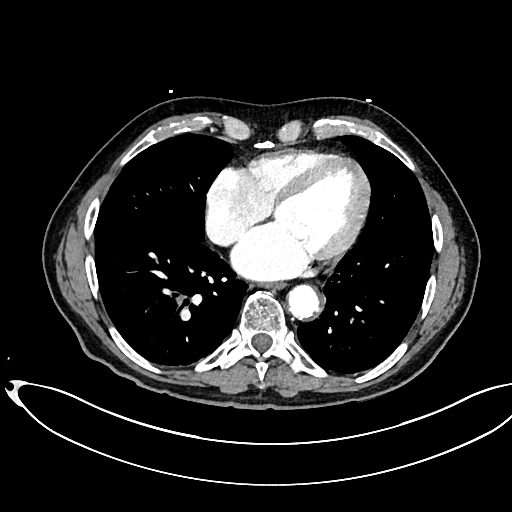
[im 71/185  lung]
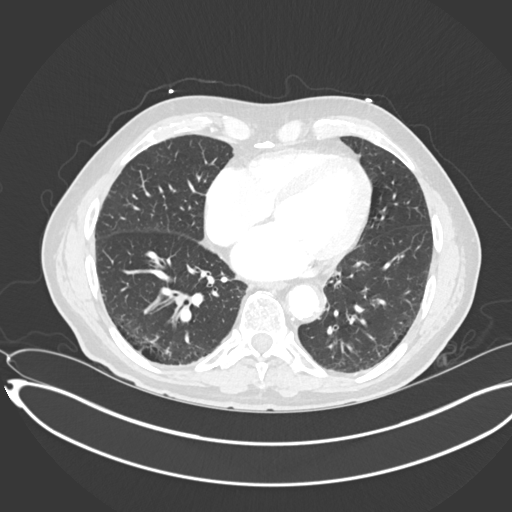
[im 85/185  lung]
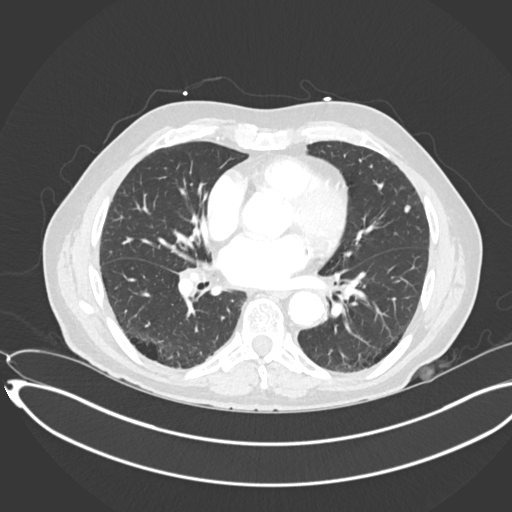
[im 100/185  lung]
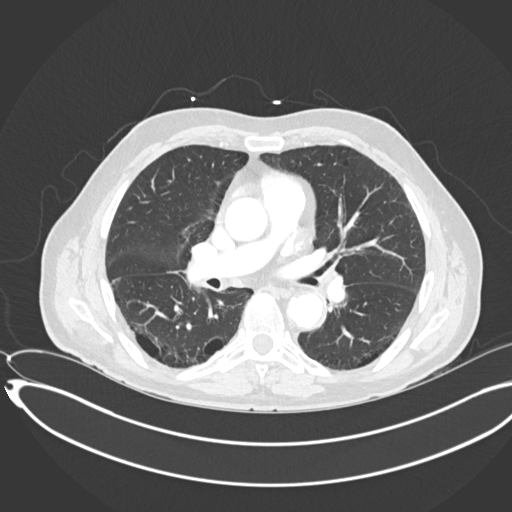
[im 114/185  lung]
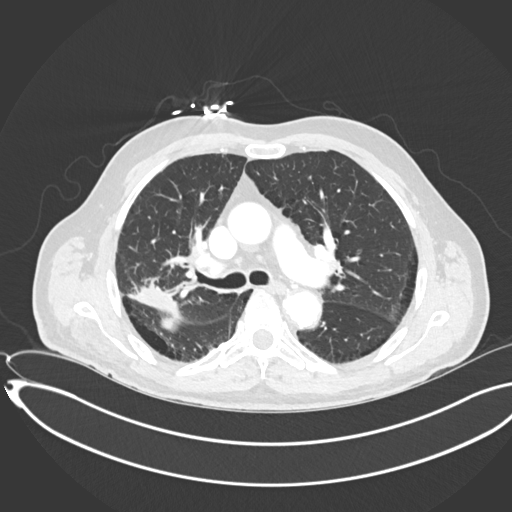
[im 128/185  mediastinal]
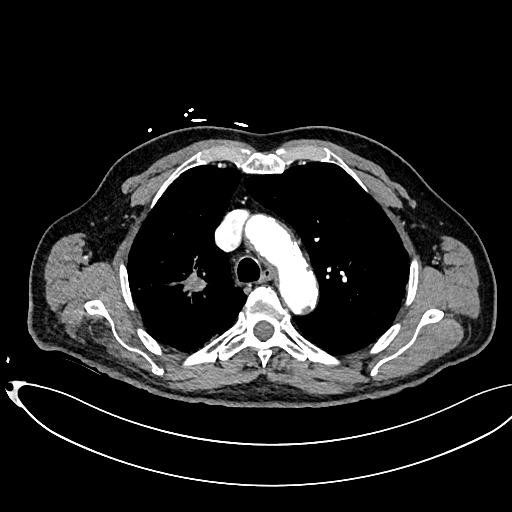
[im 128/185  lung]
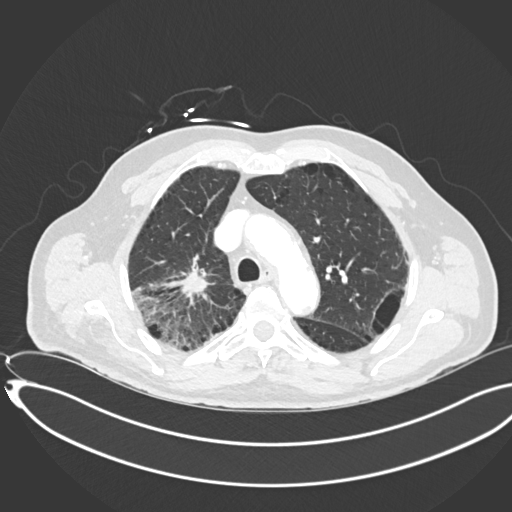
[im 142/185  lung]
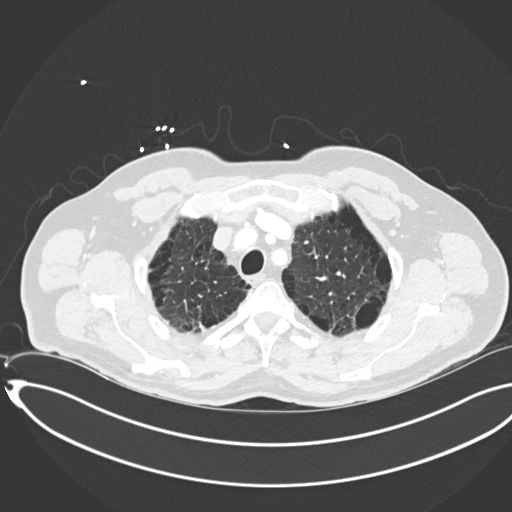
[im 156/185  lung]
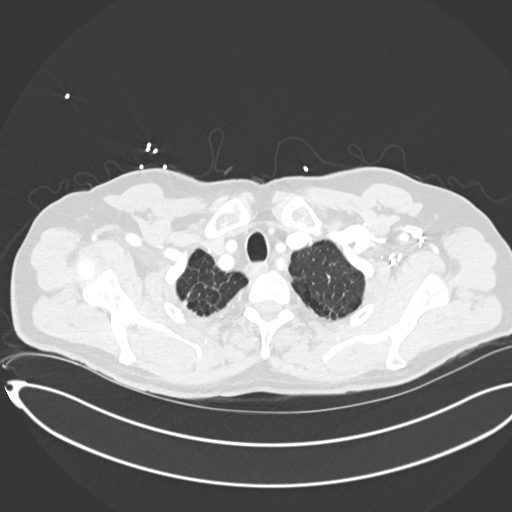
[im 170/185  lung]
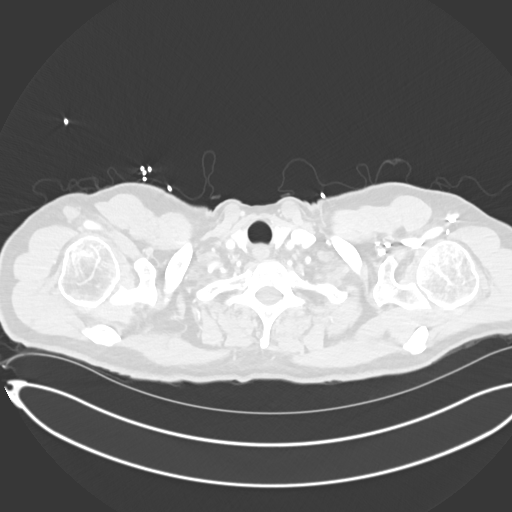

[Series 6: cor · coronal · 0.75mm/px · 3 of 139 slices shown]
[im 28/139  lung]
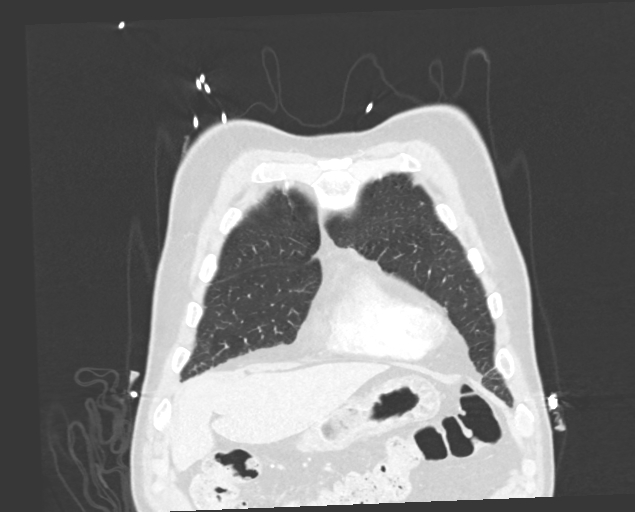
[im 56/139  lung]
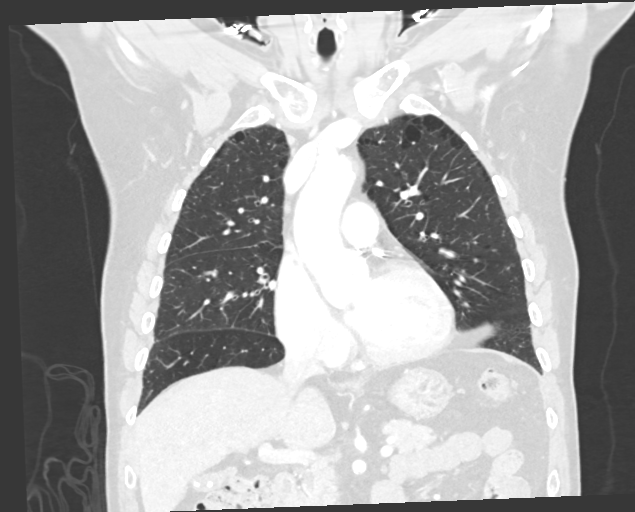
[im 83/139  lung]
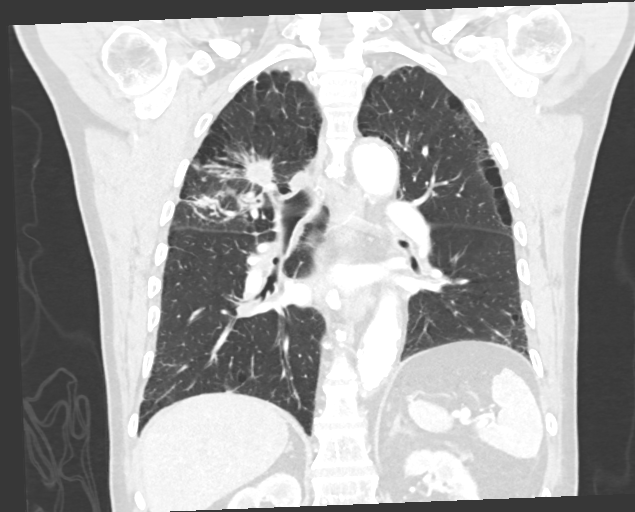

[15 of 36 positions shown; findings below may reference images not displayed]

RADIATION DOSE REDUCTION: This exam was performed according to the
departmental dose-optimization program which includes automated
exposure control, adjustment of the mA and/or kV according to
patient size and/or use of iterative reconstruction technique.

CONTRAST:  75mL OMNIPAQUE IOHEXOL 300 MG/ML  SOLN
FINDINGS: Cardiovascular: There is no cardiomegaly or pericardial effusion.
There is coronary vascular calcification. Advanced atherosclerotic
calcification of the thoracic aorta. No aneurysmal dilatation or
dissection. The origins of the great vessels of the aortic arch
appear patent as visualized. The central pulmonary arteries appear
patent.

Mediastinum/Nodes: No hilar or mediastinal adenopathy. The esophagus
is grossly unremarkable. No mediastinal fluid collection.

Lungs/Pleura: Background of severe paraseptal emphysema. Two
adjacent lesions in the right upper lobe with spiculated margins
measuring 4.0 x 3.5 cm along the major fissure and 2.0 x 2.2 cm
adjacent to the right upper lobe bronchus. There is thickening of
the peribronchial tissues and occlusion of the right upper lobe
bronchus. Findings most consistent with malignancy. Clinical
correlation and multidisciplinary consult is advised. There is no
pleural effusion pneumothorax.

Upper Abdomen: No acute abnormality.

Musculoskeletal: Degenerative changes of the spine. No acute osseous
pathology.
IMPRESSION: 1. Right upper lobe spiculated lesions consistent with malignancy.
Multidisciplinary consult is advised.
2. No evidence of metastatic disease.
3. Aortic Atherosclerosis (LUSP8-4K4.4) and Emphysema (LUSP8-WHC.I).

## 2022-07-06 ENCOUNTER — Encounter: Payer: Self-pay | Admitting: Internal Medicine

## 2022-07-06 ENCOUNTER — Inpatient Hospital Stay (HOSPITAL_BASED_OUTPATIENT_CLINIC_OR_DEPARTMENT_OTHER): Payer: Medicare HMO | Admitting: Internal Medicine

## 2022-07-06 ENCOUNTER — Other Ambulatory Visit: Payer: Self-pay

## 2022-07-06 ENCOUNTER — Inpatient Hospital Stay: Payer: Medicare HMO

## 2022-07-06 ENCOUNTER — Encounter: Payer: Self-pay | Admitting: Medical Oncology

## 2022-07-06 ENCOUNTER — Inpatient Hospital Stay: Payer: Medicare HMO | Attending: Internal Medicine

## 2022-07-06 VITALS — BP 130/70 | HR 69 | Resp 17

## 2022-07-06 VITALS — BP 110/64 | HR 86 | Temp 98.1°F | Resp 16 | Wt 164.6 lb

## 2022-07-06 DIAGNOSIS — C3411 Malignant neoplasm of upper lobe, right bronchus or lung: Secondary | ICD-10-CM | POA: Diagnosis not present

## 2022-07-06 DIAGNOSIS — C3491 Malignant neoplasm of unspecified part of right bronchus or lung: Secondary | ICD-10-CM | POA: Diagnosis not present

## 2022-07-06 DIAGNOSIS — Z79899 Other long term (current) drug therapy: Secondary | ICD-10-CM | POA: Insufficient documentation

## 2022-07-06 DIAGNOSIS — Z7952 Long term (current) use of systemic steroids: Secondary | ICD-10-CM | POA: Insufficient documentation

## 2022-07-06 DIAGNOSIS — Z7982 Long term (current) use of aspirin: Secondary | ICD-10-CM | POA: Diagnosis not present

## 2022-07-06 DIAGNOSIS — Z5111 Encounter for antineoplastic chemotherapy: Secondary | ICD-10-CM | POA: Diagnosis not present

## 2022-07-06 DIAGNOSIS — Z923 Personal history of irradiation: Secondary | ICD-10-CM | POA: Insufficient documentation

## 2022-07-06 DIAGNOSIS — Z5112 Encounter for antineoplastic immunotherapy: Secondary | ICD-10-CM | POA: Diagnosis not present

## 2022-07-06 DIAGNOSIS — C3490 Malignant neoplasm of unspecified part of unspecified bronchus or lung: Secondary | ICD-10-CM

## 2022-07-06 LAB — CBC WITH DIFFERENTIAL (CANCER CENTER ONLY)
Abs Immature Granulocytes: 0.02 10*3/uL (ref 0.00–0.07)
Basophils Absolute: 0 10*3/uL (ref 0.0–0.1)
Basophils Relative: 1 %
Eosinophils Absolute: 0.3 10*3/uL (ref 0.0–0.5)
Eosinophils Relative: 6 %
HCT: 35 % — ABNORMAL LOW (ref 39.0–52.0)
Hemoglobin: 11.3 g/dL — ABNORMAL LOW (ref 13.0–17.0)
Immature Granulocytes: 0 %
Lymphocytes Relative: 20 %
Lymphs Abs: 1.1 10*3/uL (ref 0.7–4.0)
MCH: 30.5 pg (ref 26.0–34.0)
MCHC: 32.3 g/dL (ref 30.0–36.0)
MCV: 94.6 fL (ref 80.0–100.0)
Monocytes Absolute: 0.6 10*3/uL (ref 0.1–1.0)
Monocytes Relative: 10 %
Neutro Abs: 3.5 10*3/uL (ref 1.7–7.7)
Neutrophils Relative %: 63 %
Platelet Count: 185 10*3/uL (ref 150–400)
RBC: 3.7 MIL/uL — ABNORMAL LOW (ref 4.22–5.81)
RDW: 14.7 % (ref 11.5–15.5)
WBC Count: 5.6 10*3/uL (ref 4.0–10.5)
nRBC: 0 % (ref 0.0–0.2)

## 2022-07-06 LAB — CMP (CANCER CENTER ONLY)
ALT: 8 U/L (ref 0–44)
AST: 11 U/L — ABNORMAL LOW (ref 15–41)
Albumin: 3.8 g/dL (ref 3.5–5.0)
Alkaline Phosphatase: 79 U/L (ref 38–126)
Anion gap: 7 (ref 5–15)
BUN: 18 mg/dL (ref 8–23)
CO2: 29 mmol/L (ref 22–32)
Calcium: 9.3 mg/dL (ref 8.9–10.3)
Chloride: 105 mmol/L (ref 98–111)
Creatinine: 1.37 mg/dL — ABNORMAL HIGH (ref 0.61–1.24)
GFR, Estimated: 52 mL/min — ABNORMAL LOW (ref >60)
Glucose, Bld: 120 mg/dL — ABNORMAL HIGH (ref 70–99)
Potassium: 4.1 mmol/L (ref 3.5–5.1)
Sodium: 141 mmol/L (ref 135–145)
Total Bilirubin: 0.7 mg/dL (ref 0.3–1.2)
Total Protein: 6.6 g/dL (ref 6.5–8.1)

## 2022-07-06 LAB — TSH: TSH: 2.81 u[IU]/mL (ref 0.350–4.500)

## 2022-07-06 MED ORDER — SODIUM CHLORIDE 0.9 % IV SOLN
1500.0000 mg | Freq: Once | INTRAVENOUS | Status: AC
  Administered 2022-07-06: 1500 mg via INTRAVENOUS
  Filled 2022-07-06: qty 30

## 2022-07-06 MED ORDER — SODIUM CHLORIDE 0.9 % IV SOLN: Freq: Once | INTRAVENOUS | Status: AC

## 2022-07-06 NOTE — Patient Instructions (Signed)
[facility]  Discharge Instructions: Thank you for choosing [facility] to provide your oncology and hematology care.   If you have a lab appointment with the Cancer Center, please go directly to the Cancer Center and check in at the registration area.   Wear comfortable clothing and clothing appropriate for easy access to any Portacath or PICC line.   We strive to give you quality time with your provider. You may need to reschedule your appointment if you arrive late (15 or more minutes).  Arriving late affects you and other patients whose appointments are after yours.  Also, if you miss three or more appointments without notifying the office, you may be dismissed from the clinic at the provider's discretion.      For prescription refill requests, have your pharmacy contact our office and allow 72 hours for refills to be completed.    Today you received the following chemotherapy and/or immunotherapy agents: durvalumab        To help prevent nausea and vomiting after your treatment, we encourage you to take your nausea medication as directed.  BELOW ARE SYMPTOMS THAT SHOULD BE REPORTED IMMEDIATELY: *FEVER GREATER THAN 100.4 F (38 C) OR HIGHER *CHILLS OR SWEATING *NAUSEA AND VOMITING THAT IS NOT CONTROLLED WITH YOUR NAUSEA MEDICATION *UNUSUAL SHORTNESS OF BREATH *UNUSUAL BRUISING OR BLEEDING *URINARY PROBLEMS (pain or burning when urinating, or frequent urination) *BOWEL PROBLEMS (unusual diarrhea, constipation, pain near the anus) TENDERNESS IN MOUTH AND THROAT WITH OR WITHOUT PRESENCE OF ULCERS (sore throat, sores in mouth, or a toothache) UNUSUAL RASH, SWELLING OR PAIN  UNUSUAL VAGINAL DISCHARGE OR ITCHING   Items with * indicate a potential emergency and should be followed up as soon as possible or go to the Emergency Department if any problems should occur.  Please show the CHEMOTHERAPY ALERT CARD or IMMUNOTHERAPY ALERT CARD at  check-in to the Emergency Department and triage nurse.  Should you have questions after your visit or need to cancel or reschedule your appointment, please contact [facility]  Dept: [phone]  and follow the prompts.  Office hours are 8:00 a.m. to 4:30 p.m. Monday - Friday. Please note that voicemails left after 4:00 p.m. may not be returned until the following business day.  We are closed weekends and major holidays. You have access to a nurse at all times for urgent questions. Please call the main number to the clinic Dept: [phone] and follow the prompts.   For any non-urgent questions, you may also contact your provider using MyChart. We now offer e-Visits for anyone 18 and older to request care online for non-urgent symptoms. For details visit [email or web address].   Also download the MyChart app! Go to the app store, search "MyChart", open the app, select [place], and log in with your MyChart username and password.   

## 2022-07-06 NOTE — Progress Notes (Signed)
[place] Telephone:([phone]) [phone]   Fax:([phone]) [phone]  OFFICE PROGRESS NOTE  [name], MD [name] [address]  DIAGNOSIS: Stage IIIC (T3, N3, M0) non-small cell lung cancer, adenocarcinoma presented with right upper lobe lung mass status post right upper lobe wedge resection but no lymph node dissection.  The tumor measured 3.3 cm with visceral pleural involvement and very close resection margin less than 0.1 cm from the tumor.  This was performed on May 13, 2021 in the same setting of CABG under the care of Dr. Kipp Brood.  PET scan performed in March 2023 showed a right upper lobe nodule adjacent to the wedge resection site small hypermetabolic right hilar, right subcarinal, and left infrahilar lymph nodes which could be reactive but are suspicious for nodal involvement.   Molecular studies showed no actionable mutations and PD-L1 expression was 0%   PRIOR THERAPY:  1) Right upper lobe wedge resection under the care of Dr. Kipp Brood on 05/13/2021. 2) Concurrent chemoradiation with weekly carboplatin for AUC of 2 and paclitaxel 45 Mg/M2.  First dose Aug 11, 2021.  Status post 6 cycles.  Last dose was given September 22, 2021   CURRENT THERAPY: Consolidation treatment with immunotherapy with Imfinzi 1500 Mg IV every 4 weeks.  First dose October 27, 2021.  Status post 8 cycles.  INTERVAL HISTORY: Kevin [name] 82 y.o. male returns to the clinic today for follow-up visit accompanied by his wife.  The patient is feeling fine today with no concerning complaints except for the persistent cough which is improved compared to few weeks ago.  He is currently on Tessalon and Hycodan on as-needed basis.  He has no chest pain, shortness of breath or hemoptysis.  He has no nausea, vomiting, diarrhea or constipation.  He has no headache or visual changes.  He is here today for evaluation before starting cycle #9 of his treatment.  MEDICAL HISTORY: Past Medical History:   Diagnosis Date   Arthritis    Coronary artery disease    Hyperlipidemia    SCC (squamous cell carcinoma) 05/28/2004   top of left hand curet x3 68fu   SCC (squamous cell carcinoma) 09/13/2013   mid back tx cx3 35fu   SCC (squamous cell carcinoma) 09/06/2018   left wrist tx with bx   Squamous cell carcinoma of skin 11/16/2003   left ear tip tx curet and cautery 24fu    ALLERGIES:  has No Known Allergies.  MEDICATIONS:  Current Outpatient Medications  Medication Sig Dispense Refill   acetaminophen (TYLENOL) 500 MG tablet Take 500 mg by mouth daily as needed for moderate pain, fever or headache.     albuterol (PROVENTIL) (2.5 MG/3ML) 0.083% nebulizer solution Take 3 mLs (2.5 mg total) by nebulization every 6 (six) hours as needed for wheezing or shortness of breath. (Patient not taking: Reported on 05/08/2022) 360 mL 11   aspirin EC 81 MG tablet Take 81 mg by mouth daily.     atorvastatin (LIPITOR) 80 MG tablet Take 1 tablet (80 mg total) by mouth daily. 90 tablet 3   benzonatate (TESSALON) 200 MG capsule Take 1 capsule (200 mg total) by mouth 3 (three) times daily as needed. 45 capsule 2   Budeson-Glycopyrrol-Formoterol (BREZTRI AEROSPHERE) 160-9-4.8 MCG/ACT AERO Inhale 2 puffs into the lungs in the morning and at bedtime. 1 each 5   Cholecalciferol (DIALYVITE VITAMIN D 5000) 125 MCG (5000 UT) capsule Take 5,000 Units by mouth daily.  HYDROcodone bit-homatropine (HYCODAN) 5-1.5 MG/5ML syrup Take 5 mLs by mouth every 6 (six) hours as needed for cough. 120 mL 0   metoprolol tartrate (LOPRESSOR) 25 MG tablet Take 0.5 tablets (12.5 mg total) by mouth 2 (two) times daily. (Patient taking differently: Take 12.5 mg by mouth 2 (two) times daily with breakfast and lunch.) 180 tablet 3   nitroGLYCERIN (NITROSTAT) 0.4 MG SL tablet Place 0.4 mg under the tongue every 5 (five) minutes as needed for chest pain.     predniSONE (DELTASONE) 10 MG tablet Take 10 mg by mouth 2 (two) times daily. (Patient  not taking: Reported on 06/29/2022)     No current facility-administered medications for this visit.    SURGICAL HISTORY:  Past Surgical History:  Procedure Laterality Date   BRONCHIAL BIOPSY  07/22/2021   Procedure: BRONCHIAL BIOPSIES;  Surgeon: Collene Gobble, MD;  Location: [facility];  Service: Cardiopulmonary;;   BRONCHIAL BRUSHINGS  07/22/2021   Procedure: BRONCHIAL BRUSHINGS;  Surgeon: Collene Gobble, MD;  Location: [facility];  Service: Cardiopulmonary;;   BRONCHIAL NEEDLE ASPIRATION BIOPSY  07/22/2021   Procedure: BRONCHIAL NEEDLE ASPIRATION BIOPSIES;  Surgeon: Collene Gobble, MD;  Location: [facility];  Service: Cardiopulmonary;;   CHOLECYSTECTOMY N/A 02/15/2022   Procedure: LAPAROSCOPIC CHOLECYSTECTOMY;  Surgeon: Virl Cagey, MD;  Location: [facility];  Service: General;  Laterality: N/A;   CORONARY ARTERY BYPASS GRAFT N/A 05/13/2021   Procedure: CORONARY ARTERY BYPASS GRAFTING (CABG) TIMES 3  , ON PUMP, USING LEFT INTERNAL MAMMARY ARTERY AND RIGHT GREATER SAPHENOUS VEIN HARVESTED ENDOSCOPICALLY;  Surgeon: Lajuana Matte, MD;  Location: [facility];  Service: Open Heart Surgery;  Laterality: N/A;   ENDOVEIN HARVEST OF GREATER SAPHENOUS VEIN Right 05/13/2021   Procedure: ENDOVEIN HARVEST OF GREATER SAPHENOUS VEIN;  Surgeon: Lajuana Matte, MD;  Location: [facility];  Service: Open Heart Surgery;  Laterality: Right;   epidural     back pain   IR CT SPINE LTD  06/18/2020   IR KYPHO LUMBAR INC FX REDUCE BONE BX UNI/BIL CANNULATION INC/IMAGING  06/18/2020   LEFT HEART CATH AND CORONARY ANGIOGRAPHY N/A 05/06/2021   Procedure: LEFT HEART CATH AND CORONARY ANGIOGRAPHY;  Surgeon: [name], MD;  Location: [facility];  Service: Cardiovascular;  Laterality: N/A;   LUNG BIOPSY Right 05/13/2021   Procedure: RIGHT LUNG BIOPSY;  Surgeon: Lajuana Matte, MD;  Location: [facility];  Service: Open Heart Surgery;  Laterality: Right;   MINOR HEMORRHOIDECTOMY     SHOULDER SURGERY      TEE WITHOUT CARDIOVERSION N/A 05/13/2021   Procedure: TRANSESOPHAGEAL ECHOCARDIOGRAM (TEE);  Surgeon: Lajuana Matte, MD;  Location: [facility];  Service: Open Heart Surgery;  Laterality: N/A;   TRANSURETHRAL RESECTION OF BLADDER TUMOR  2014   TRANSURETHRAL RESECTION OF BLADDER TUMOR N/A 05/19/2018   Procedure: TRANSURETHRAL RESECTION OF BLADDER TUMOR (TURBT) AND (INTRAVESICAL GEMCITABINE INSTILLED IN BLADDER IN PACU) ;  Surgeon: Franchot Gallo, MD;  Location: [facility];  Service: Urology;  Laterality: N/A;  [place] Bilateral 07/22/2021   Procedure: VIDEO BRONCHOSCOPY WITH ENDOBRONCHIAL ULTRASOUND;  Surgeon: Collene Gobble, MD;  Location[facility];  Service: Cardiopulmonary;  Laterality: Bilateral;    REVIEW OF SYSTEMS:  A comprehensive review of systems was negative except for: Respiratory: positive for cough   PHYSICAL EXAMINATION: General appearance: alert, cooperative, and no distress Head: Normocephalic, without obvious abnormality, atraumatic Neck: no adenopathy, no JVD, supple, symmetrical, trachea midline, and  thyroid not enlarged, symmetric, no tenderness/mass/nodules Lymph nodes: Cervical, supraclavicular, and axillary nodes normal. Resp: clear to auscultation bilaterally Back: symmetric, no curvature. ROM normal. No CVA tenderness. Cardio: regular rate and rhythm, S1, S2 normal, no murmur, click, rub or gallop GI: soft, non-tender; bowel sounds normal; no masses,  no organomegaly Extremities: extremities normal, atraumatic, no cyanosis or edema  ECOG PERFORMANCE STATUS: 0 - Asymptomatic  Blood pressure 110/64, pulse 86, temperature 98.1 F (36.7 C), temperature source Oral, resp. rate 16, weight 164 lb 9.6 oz (74.7 kg), SpO2 98 %.  LABORATORY DATA: Lab Results  Component Value Date   WBC 5.6 07/06/2022   HGB 11.3 (L) 07/06/2022   HCT 35.0 (L) 07/06/2022   MCV 94.6 07/06/2022   PLT 185 07/06/2022      Chemistry       Component Value Date/Time   NA 141 07/06/2022 1258   NA 139 05/01/2021 1146   K 4.1 07/06/2022 1258   CL 105 07/06/2022 1258   CO2 29 07/06/2022 1258   BUN 18 07/06/2022 1258   BUN 20 05/01/2021 1146   CREATININE 1.37 (H) 07/06/2022 1258      Component Value Date/Time   CALCIUM 9.3 07/06/2022 1258   ALKPHOS 79 07/06/2022 1258   AST 11 (L) 07/06/2022 1258   ALT 8 07/06/2022 1258   BILITOT 0.7 07/06/2022 1258       RADIOGRAPHIC STUDIES: No results found.  ASSESSMENT AND PLAN: This is a very pleasant 82 years old white male diagnosed with stage IIIC (T3, N3, M0) non-small cell lung cancer, adenocarcinoma with no actionable mutations and PD-L1 expression of 0% diagnosed in January 2023 status post wedge resection of the right upper lobe lung mass in a setting of CABG under the care of Dr. Kipp Brood but the PET scan showed residual disease in the right upper lobe in addition to hypermetabolic right hilar, subcarinal and left infrahilar lymph nodes. The patient was seen by Dr. Lamonte Sakai and biopsy of the right upper lobe as well as the subcarinal and left hilar lymph nodes were positive for malignancy consistent with non-small cell lung cancer, adenocarcinoma. He underwent a course of concurrent chemoradiation with weekly carboplatin for AUC of 2 and paclitaxel 45 Mg/M2 status post 6 cycles.  The patient tolerated this previous course of concurrent chemoradiation fairly well except for fatigue and mild odynophagia. The patient is currently undergoing consolidation treatment with immunotherapy with Imfinzi 1500 Mg IV every 4 weeks status post 8 cycles.    The patient has been tolerating this treatment well with no concerning adverse effects. I recommended for him to proceed with cycle #9 today as planned. I will see him back for follow-up visit in 4 weeks for evaluation before starting cycle #10 with repeat CT scan of the chest for restaging of his disease. The patient was advised to call  immediately if he has any other concerning symptoms in the interval. The patient voices understanding of current disease status and treatment options and is in agreement with the current care plan. All questions were answered. The patient knows to call the clinic with any problems, questions or concerns. We can certainly see the patient much sooner if necessary. The total time spent in the appointment was 20 minutes.   Disclaimer: This note was dictated with voice recognition software. Similar sounding words can inadvertently be transcribed and may not be corrected upon review.

## 2022-07-06 NOTE — Progress Notes (Unsigned)
Patient seen by MD today  Vitals are within treatment parameters.  Labs reviewed: and are within treatment parameters.  Per physician team, patient is ready for treatment and there are NO modifications to the treatment plan.  

## 2022-07-08 LAB — T4: T4, Total: 5.4 ug/dL (ref 4.5–12.0)

## 2022-07-09 IMAGING — DX DG CHEST 1V PORT
1 series · 1 of 1 positions shown · non-contrast
Comparison: 05/13/2021

CLINICAL DATA: Post open heart surgery.  Chest tube placement.

EXAM:
PORTABLE CHEST 1 VIEW

[chest]
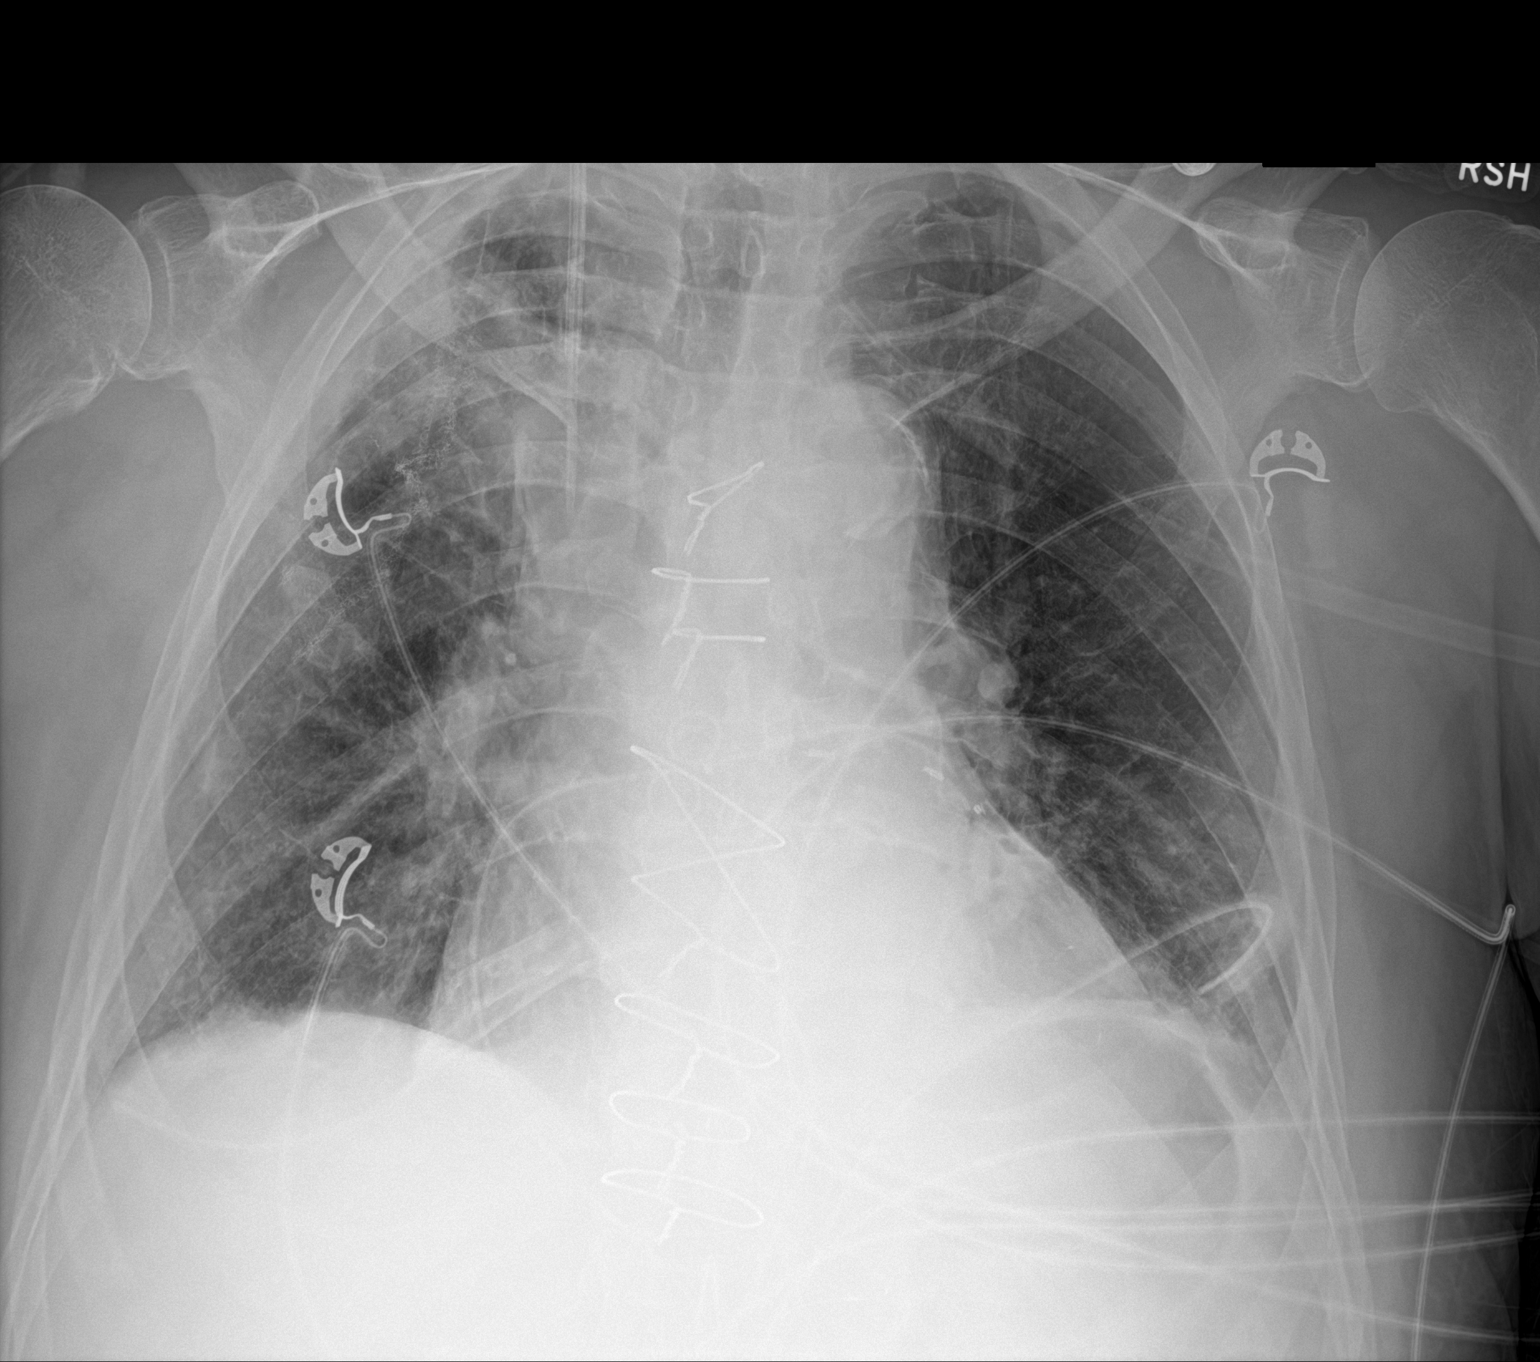

[1 of 1 positions shown; findings below may reference images not displayed]

FINDINGS: Grossly unchanged cardiac silhouette and mediastinal contours post
median sternotomy and CABG. Atherosclerotic plaque within the
thoracic aorta. Interval extubation removal of enteric tube. Stable
positioning of right jugular approach center venous catheter with
tip projected over the mid SVC. Stable positioning bibasilar chest
tubes. No definite pneumothorax.

Stable postoperative change of the right upper lung with associated
heterogeneous consolidative opacities and volume loss. Slightly
reduced lung volumes with worsening bibasilar opacities trace
bilateral effusions are not excluded. Mild pulmonary venous
congestion or frank evidence of edema. No acute osseous
abnormalities.
IMPRESSION: 1. Interval extubation removal of enteric tube. Otherwise, stable
positioning of remaining support apparatus. No pneumothorax.
2. Stable postoperative change of the right upper lung with
associated volume loss.
3. Pulmonary venous congestion without frank evidence of edema.

## 2022-07-10 IMAGING — DX DG CHEST 1V PORT
1 series · 1 of 1 positions shown · non-contrast
Comparison: Chest radiograph from one day prior.

CLINICAL DATA: Status post CABG, chest tube present

EXAM:
PORTABLE CHEST 1 VIEW

[chest]
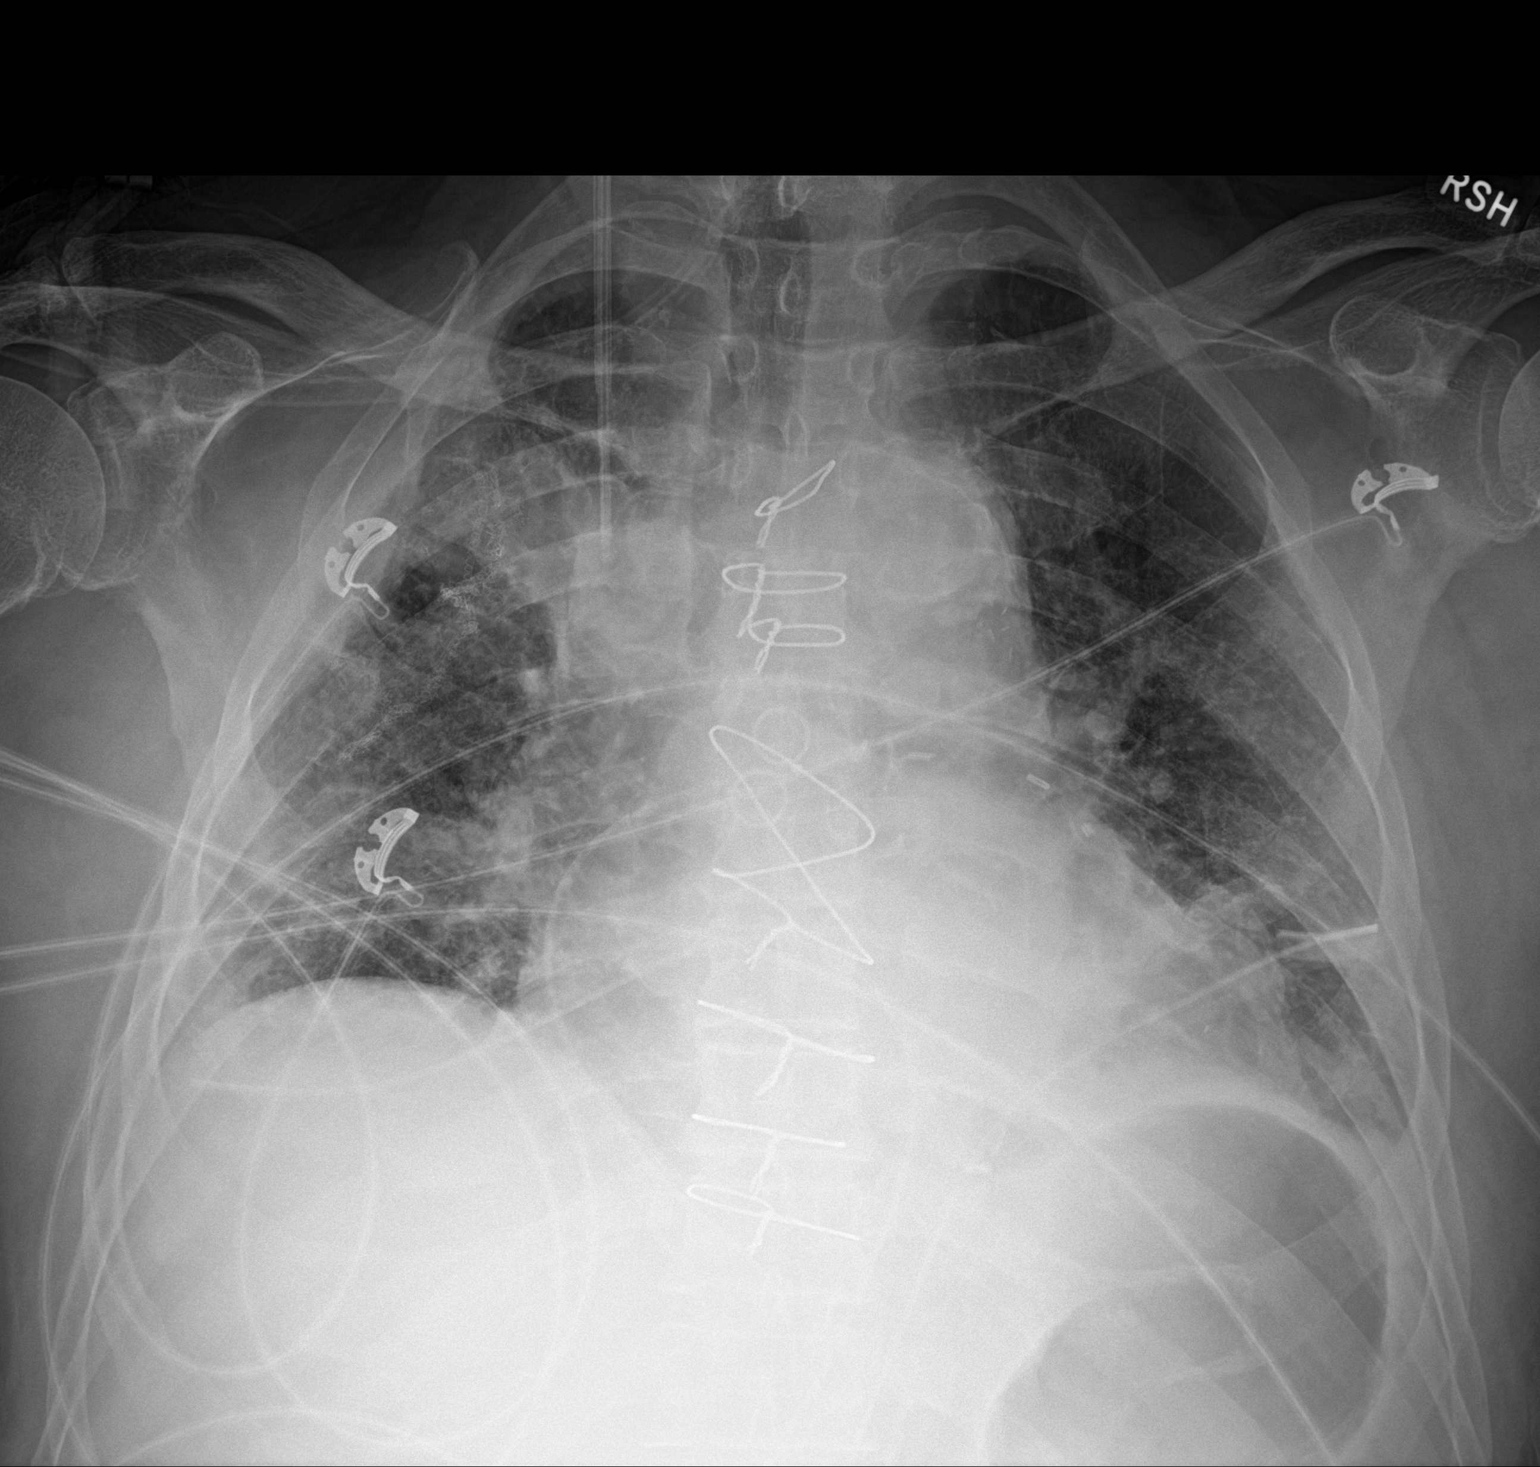

[1 of 1 positions shown; findings below may reference images not displayed]

FINDINGS: Right internal jugular central venous catheter terminates in the
upper third of the SVC. Intact sternotomy wires. CABG clips overlie
the mediastinum. Stable lower left chest tube. Stable
cardiomediastinal silhouette with mild cardiomegaly. No
pneumothorax. No pleural effusion. Surgical sutures overlie the mid
to upper right lung. Bandlike upper right lung opacity is similar,
favor atelectasis. Hazy and streaky bibasilar opacities are similar,
favor atelectasis. No overt pulmonary edema.
IMPRESSION: 1. No pneumothorax. Stable lower left chest tube.
2. Stable mild cardiomegaly without overt pulmonary edema.
3. Bandlike upper right lung opacity and hazy and streaky bibasilar
opacities, similar, favor atelectasis.

## 2022-07-16 ENCOUNTER — Encounter: Payer: Self-pay | Admitting: Cardiology

## 2022-07-16 ENCOUNTER — Ambulatory Visit: Payer: Medicare HMO | Admitting: Cardiology

## 2022-07-16 VITALS — BP 112/58 | HR 66 | Resp 16 | Ht 71.0 in | Wt 166.6 lb

## 2022-07-16 DIAGNOSIS — I2510 Atherosclerotic heart disease of native coronary artery without angina pectoris: Secondary | ICD-10-CM

## 2022-07-16 DIAGNOSIS — E7800 Pure hypercholesterolemia, unspecified: Secondary | ICD-10-CM

## 2022-07-16 DIAGNOSIS — N1831 Chronic kidney disease, stage 3a: Secondary | ICD-10-CM | POA: Diagnosis not present

## 2022-07-16 DIAGNOSIS — R053 Chronic cough: Secondary | ICD-10-CM

## 2022-07-16 MED ORDER — BENZONATATE 100 MG PO CAPS: 100.0000 mg | ORAL_CAPSULE | Freq: Two times a day (BID) | ORAL | 0 refills | Status: DC | PRN

## 2022-07-16 NOTE — Progress Notes (Signed)
Primary Physician/Referring:  [name], MD  Patient ID: Kevin [name], male    DOB: 02-12-1941, 82 y.o.   MRN: [id number]  Chief Complaint  Patient presents with   Coronary Artery Disease   Hyperlipidemia   Follow-up    6 months   HPI:    Kevin [name]  is a 82 y.o. Caucasian male with history of hyperlipidemia, 30-pack-year history of smoking quit in 1987, who was referred to our office for evaluation of chest pain and abnormal EKG.  Patient underwent cardiac catheterization 05/01/2021 revealing multivessel disease including critical left main disease, subsequently underwent CABG x3 on 05/13/2021.  During admission patient diagnosed with right upper lung adenocarcinoma, and underwent lung wedge resection.  He is being followed by oncology as well.  He is now undergoing immunotherapy for secondaries.  He is presently asymptomatic except for persistent cough.  Past Medical History:  Diagnosis Date   Arthritis    Coronary artery disease    Hyperlipidemia    SCC (squamous cell carcinoma) 05/28/2004   top of left hand curet x3 67fu   SCC (squamous cell carcinoma) 09/13/2013   mid back tx cx3 66fu   SCC (squamous cell carcinoma) 09/06/2018   left wrist tx with bx   Squamous cell carcinoma of skin 11/16/2003   left ear tip tx curet and cautery 71fu   Social History   Tobacco Use   Smoking status: Former    Packs/day: 1.00    Years: 30.00    Additional pack years: 0.00    Total pack years: 30.00    Types: Cigarettes    Quit date: 04/14/1995    Years since quitting: 27.2   Smokeless tobacco: Never  Substance Use Topics   Alcohol use: Never   Marital Status: Married  ROS  Review of Systems  Cardiovascular:  Negative for chest pain, dyspnea on exertion, leg swelling and syncope.  Neurological:  Negative for dizziness.   Objective  Blood pressure (!) 112/58, pulse 66, resp. rate 16, height 5\' 11"  (1.803 m), weight 166 lb 9.6 oz (75.6 kg), SpO2 96 %. Body mass index is 23.24  kg/m.     07/16/2022    1:24 PM 07/06/2022    3:48 PM 07/06/2022    1:18 PM  Vitals with BMI  Height 5\' 11"     Weight 166 lbs 10 oz  164 lbs 10 oz  BMI [id number]  [id number]  Systolic [id number]  Diastolic 58 70 64  Pulse 66 69 86    Physical Exam Vitals reviewed.  Neck:     Vascular: No carotid bruit or JVD.  Cardiovascular:     Rate and Rhythm: Normal rate and regular rhythm.     Pulses: Intact distal pulses.     Heart sounds: Normal heart sounds. No murmur heard.    No gallop.  Pulmonary:     Effort: Pulmonary effort is normal.     Breath sounds: Normal breath sounds.  Musculoskeletal:     Right lower leg: No edema.     Left lower leg: No edema.    Laboratory examination:   Lab Results  Component Value Date   NA 141 07/06/2022   K 4.1 07/06/2022   CO2 29 07/06/2022   GLUCOSE 120 (H) 07/06/2022   BUN 18 07/06/2022   CREATININE 1.37 (H) 07/06/2022   CALCIUM 9.3 07/06/2022   EGFR 52 (L) 05/01/2021   GFRNONAA 52 (L) 07/06/2022       Latest Ref Rng &  Units 07/06/2022   12:58 PM 06/08/2022    9:00 AM 05/11/2022    9:17 AM  CMP  Glucose 70 - 99 mg/dL 120  126  93   BUN 8 - 23 mg/dL 18  17  23    Creatinine 0.61 - 1.24 mg/dL 1.37  1.13  1.27   Sodium 135 - 145 mmol/L 141  135  136   Potassium 3.5 - 5.1 mmol/L 4.1  3.8  4.0   Chloride 98 - 111 mmol/L 105  101  104   CO2 22 - 32 mmol/L 29  27  25    Calcium 8.9 - 10.3 mg/dL 9.3  8.7  9.0   Total Protein 6.5 - 8.1 g/dL 6.6  6.9  6.9   Total Bilirubin 0.3 - 1.2 mg/dL 0.7  1.7  0.9   Alkaline Phos 38 - 126 U/L 79  131  86   AST 15 - 41 U/L 11  24  13    ALT 0 - 44 U/L 8  27  13        Latest Ref Rng & Units 07/06/2022   12:58 PM 06/08/2022    9:00 AM 05/11/2022    9:17 AM  CBC  WBC 4.0 - 10.5 K/uL 5.6  7.6  9.0   Hemoglobin 13.0 - 17.0 g/dL 11.3  12.2  13.4   Hematocrit 39.0 - 52.0 % 35.0  36.5  39.2   Platelets 150 - 400 K/uL 185  231  239     Lipid Panel     Component Value Date/Time   LDLDIRECT 80 05/01/2021 1146    HEMOGLOBIN A1C Lab Results  Component Value Date   HGBA1C 5.2 05/13/2021   MPG 102.54 05/13/2021   TSH Recent Labs    12/22/21 0843 04/14/22 0848 07/06/22 1257  TSH 3.590 3.585 2.810    External labs:   07/05/2020: Total cholesterol 149, triglycerides 134, HDL 40, LDL 85.  Allergies  No Known Allergies   Medications      Current Outpatient Medications:    acetaminophen (TYLENOL) 500 MG tablet, Take 500 mg by mouth daily as needed for moderate pain, fever or headache., Disp: , Rfl:    albuterol (PROVENTIL) (2.5 MG/3ML) 0.083% nebulizer solution, Take 3 mLs (2.5 mg total) by nebulization every 6 (six) hours as needed for wheezing or shortness of breath., Disp: 360 mL, Rfl: 11   aspirin EC 81 MG tablet, Take 81 mg by mouth daily., Disp: , Rfl:    atorvastatin (LIPITOR) 80 MG tablet, Take 1 tablet (80 mg total) by mouth daily., Disp: 90 tablet, Rfl: 3   Budeson-Glycopyrrol-Formoterol (BREZTRI AEROSPHERE) 160-9-4.8 MCG/ACT AERO, Inhale 2 puffs into the lungs in the morning and at bedtime., Disp: 1 each, Rfl: 5   metoprolol tartrate (LOPRESSOR) 25 MG tablet, Take 0.5 tablets (12.5 mg total) by mouth 2 (two) times daily. (Patient taking differently: Take 12.5 mg by mouth 2 (two) times daily with breakfast and lunch.), Disp: 180 tablet, Rfl: 3   nitroGLYCERIN (NITROSTAT) 0.4 MG SL tablet, Place 0.4 mg under the tongue every 5 (five) minutes as needed for chest pain., Disp: , Rfl:    predniSONE (DELTASONE) 10 MG tablet, Take 10 mg by mouth 2 (two) times daily., Disp: , Rfl:   Radiology:   CT chest 05/07/2021: 1. Right upper lobe spiculated lesions consistent with malignancy. Multidisciplinary consult is advised. 2. No evidence of metastatic disease. 3. Aortic Atherosclerosis (ICD10-I70.0) and Emphysema (ICD10-J43.9).  Chest x-ray 05/16/2021: 1. Post removal of  chest support tubes and RIGHT IJ central venous line. No visible pneumothorax. 2. Improved aeration in the chest with trace  LEFT effusion. 3. Diminished opacity in the RIGHT upper lobe adjacent to surgical changes at the site of previous mass may reflect resolving pleural fluid and or atelectasis in this area, close attention on follow-up.  Cardiac Studies:   Left Heart Catheterization 05/06/21:  LV: 126/4, EDP 11 mmHg.  Ao 123/46, mean 33 mmHg.  No pressure gradient across aortic valve. LVEF: 40 to 45% with global hypokinesis.  LV is mildly dilated. LM: High-grade, complex, calcified 95% distal left main stenosis. LAD: Proximal segment mild to moderately calcified.  Proximal to mid 60% calcific stenosis.  Gives origin to 3 large diagonals and a fourth large diagonal from the mid to distal segment, mild disease.  No significant calcification mid to distal LAD. CX: Gives origin to high OM1 which is got mild disease, after that there is a 60% calcific focal stenosis.  Mid to distal circumflex is large, dominant with very mild noncalcific disease. RCA: Nondominant, normal.   Echocardiogram 05/07/2021:  1. Left ventricular ejection fraction, by estimation, is 40 to 45%. The left ventricle has mildly decreased function. The left ventricle demonstrates global hypokinesis. Left ventricular diastolic parameters were normal.   2. Right ventricular systolic function is normal. The right ventricular size is normal.   3. The mitral valve is normal in structure. Trivial mitral valve regurgitation. No evidence of mitral stenosis.   4. The aortic valve is normal in structure. Aortic valve regurgitation is not visualized. No aortic stenosis is present.  CABG x3 05/13/2021:  LIMA to LAD, reverse SVG to OM- 1 and left PDA. Core needle biopsy of the right upper lobe mass unsuccessful and hence underwent right upper lobe wedge resection  EKG:   EKG 07/16/2022: Normal sinus rhythm at rate of 80 bpm, normal EKG.  Single PVC, 3 PACs.  Compared to 01/14/2022, PVC new, otherwise no significant change.  Assessment     ICD-10-CM   1.  Coronary artery disease involving native coronary artery of native heart without angina pectoris  I25.10 EKG 12-Lead    2. Hypercholesteremia  E78.00     3. Stage 3a chronic kidney disease  N18.31       Medications Discontinued During This Encounter  Medication Reason   benzonatate (TESSALON) 200 MG capsule    Cholecalciferol (DIALYVITE VITAMIN D 5000) 125 MCG (5000 UT) capsule    HYDROcodone bit-homatropine (HYCODAN) 5-1.5 MG/5ML syrup     No orders of the defined types were placed in this encounter.  Orders Placed This Encounter  Procedures   EKG 12-Lead   Recommendations:   Aziz Cheairs is a 82 y.o. Caucasian male with history of hyperlipidemia, 30-pack-year history of smoking quit in 1987, who was referred to our office for evaluation of chest pain and abnormal EKG.  Patient underwent cardiac catheterization 05/01/2021 revealing multivessel disease including critical left main disease, subsequently underwent CABG x3 on 05/13/2021.  During admission patient diagnosed with right upper lung adenocarcinoma, and underwent lung wedge resection.  He is being followed by oncology as well.  He is now undergoing immunotherapy for secondaries.  1. Coronary artery disease involving native coronary artery of native heart without angina pectoris Patient is presently doing well without recurrence of angina pectoris.  No changes in the medications were done today.  Currently on a low-dose of metoprolol, I would prefer him to be on an ACE inhibitor or an ARB however waiting  for him to finish his chemotherapy.  I will probably consider this on his next office visit as he also has stage III chronic kidney disease both for cardiovascular protection and renal protection. - EKG 12-Lead  2. Hypercholesteremia He needs to have lipid profile testing, is getting labs done in a few weeks prior to his infusion for lung cancer, I have placed orders for lipid profile testing along with LPA at the cancer center. -  Lipid Panel With LDL/HDL Ratio - Lipoprotein A (LPA)  3. Stage 3a chronic kidney disease Patient's renal function fortunately has remained stable at stage IIIa chronic kidney disease.  I reviewed his external labs.  4. Persistent cough Patient has developed persistent cough, it has become extremely hard for the patient, I will try Tessalon capsules, also advised him to try PPI for 2 to 3 weeks.  - benzonatate (TESSALON PERLES) 100 MG capsule; Take 1 capsule (100 mg total) by mouth 2 (two) times daily as needed for cough.  Dispense: 60 capsule; Refill: 0     [name], MD, [facility] 07/16/2022, 1:37 PM Office: [phone] Fax: [phone] Pager: [phone]

## 2022-07-17 ENCOUNTER — Other Ambulatory Visit: Payer: Self-pay

## 2022-07-18 ENCOUNTER — Other Ambulatory Visit: Payer: Self-pay | Admitting: Cardiology

## 2022-07-18 DIAGNOSIS — I2510 Atherosclerotic heart disease of native coronary artery without angina pectoris: Secondary | ICD-10-CM

## 2022-07-18 DIAGNOSIS — E7800 Pure hypercholesterolemia, unspecified: Secondary | ICD-10-CM

## 2022-07-30 ENCOUNTER — Ambulatory Visit (HOSPITAL_COMMUNITY)
Admission: RE | Admit: 2022-07-30 | Discharge: 2022-07-30 | Disposition: A | Discharge status: Home / Self Care | Payer: Medicare HMO | Source: Ambulatory Visit | Attending: Internal Medicine | Admitting: Internal Medicine

## 2022-07-30 ENCOUNTER — Encounter (HOSPITAL_COMMUNITY): Payer: Self-pay

## 2022-07-30 DIAGNOSIS — C3490 Malignant neoplasm of unspecified part of unspecified bronchus or lung: Secondary | ICD-10-CM | POA: Insufficient documentation

## 2022-07-30 DIAGNOSIS — J439 Emphysema, unspecified: Secondary | ICD-10-CM | POA: Diagnosis not present

## 2022-07-30 DIAGNOSIS — J90 Pleural effusion, not elsewhere classified: Secondary | ICD-10-CM | POA: Diagnosis not present

## 2022-07-30 DIAGNOSIS — C3491 Malignant neoplasm of unspecified part of right bronchus or lung: Secondary | ICD-10-CM | POA: Diagnosis not present

## 2022-07-30 MED ORDER — SODIUM CHLORIDE (PF) 0.9 % IJ SOLN
INTRAMUSCULAR | Status: AC
  Filled 2022-07-30: qty 50

## 2022-07-30 MED ORDER — IOHEXOL 300 MG/ML  SOLN
75.0000 mL | Freq: Once | INTRAMUSCULAR | Status: AC | PRN
  Administered 2022-07-30: 75 mL via INTRAVENOUS

## 2022-08-03 ENCOUNTER — Encounter: Payer: Self-pay | Admitting: Medical Oncology

## 2022-08-03 ENCOUNTER — Other Ambulatory Visit: Payer: Self-pay

## 2022-08-03 ENCOUNTER — Inpatient Hospital Stay: Payer: Medicare HMO

## 2022-08-03 ENCOUNTER — Inpatient Hospital Stay (HOSPITAL_BASED_OUTPATIENT_CLINIC_OR_DEPARTMENT_OTHER): Payer: Medicare HMO | Admitting: Internal Medicine

## 2022-08-03 ENCOUNTER — Encounter: Payer: Self-pay | Admitting: Internal Medicine

## 2022-08-03 ENCOUNTER — Inpatient Hospital Stay: Payer: Medicare HMO | Attending: Internal Medicine

## 2022-08-03 DIAGNOSIS — Z79620 Long term (current) use of immunosuppressive biologic: Secondary | ICD-10-CM | POA: Insufficient documentation

## 2022-08-03 DIAGNOSIS — Z85828 Personal history of other malignant neoplasm of skin: Secondary | ICD-10-CM | POA: Insufficient documentation

## 2022-08-03 DIAGNOSIS — C3411 Malignant neoplasm of upper lobe, right bronchus or lung: Secondary | ICD-10-CM | POA: Diagnosis not present

## 2022-08-03 DIAGNOSIS — C3491 Malignant neoplasm of unspecified part of right bronchus or lung: Secondary | ICD-10-CM | POA: Diagnosis not present

## 2022-08-03 DIAGNOSIS — Z9221 Personal history of antineoplastic chemotherapy: Secondary | ICD-10-CM | POA: Insufficient documentation

## 2022-08-03 DIAGNOSIS — Z5112 Encounter for antineoplastic immunotherapy: Secondary | ICD-10-CM | POA: Diagnosis not present

## 2022-08-03 DIAGNOSIS — Z923 Personal history of irradiation: Secondary | ICD-10-CM | POA: Insufficient documentation

## 2022-08-03 LAB — CBC WITH DIFFERENTIAL (CANCER CENTER ONLY)
Abs Immature Granulocytes: 0.03 10*3/uL (ref 0.00–0.07)
Basophils Absolute: 0 10*3/uL (ref 0.0–0.1)
Basophils Relative: 0 %
Eosinophils Absolute: 0.2 10*3/uL (ref 0.0–0.5)
Eosinophils Relative: 3 %
HCT: 36.5 % — ABNORMAL LOW (ref 39.0–52.0)
Hemoglobin: 12.2 g/dL — ABNORMAL LOW (ref 13.0–17.0)
Immature Granulocytes: 0 %
Lymphocytes Relative: 16 %
Lymphs Abs: 1.1 10*3/uL (ref 0.7–4.0)
MCH: 30.5 pg (ref 26.0–34.0)
MCHC: 33.4 g/dL (ref 30.0–36.0)
MCV: 91.3 fL (ref 80.0–100.0)
Monocytes Absolute: 0.7 10*3/uL (ref 0.1–1.0)
Monocytes Relative: 11 %
Neutro Abs: 4.6 10*3/uL (ref 1.7–7.7)
Neutrophils Relative %: 70 %
Platelet Count: 177 10*3/uL (ref 150–400)
RBC: 4 MIL/uL — ABNORMAL LOW (ref 4.22–5.81)
RDW: 14.1 % (ref 11.5–15.5)
WBC Count: 6.7 10*3/uL (ref 4.0–10.5)
nRBC: 0 % (ref 0.0–0.2)

## 2022-08-03 LAB — CMP (CANCER CENTER ONLY)
ALT: 6 U/L (ref 0–44)
AST: 11 U/L — ABNORMAL LOW (ref 15–41)
Albumin: 3.9 g/dL (ref 3.5–5.0)
Alkaline Phosphatase: 76 U/L (ref 38–126)
Anion gap: 7 (ref 5–15)
BUN: 16 mg/dL (ref 8–23)
CO2: 26 mmol/L (ref 22–32)
Calcium: 9.5 mg/dL (ref 8.9–10.3)
Chloride: 104 mmol/L (ref 98–111)
Creatinine: 1.27 mg/dL — ABNORMAL HIGH (ref 0.61–1.24)
GFR, Estimated: 57 mL/min — ABNORMAL LOW (ref >60)
Glucose, Bld: 101 mg/dL — ABNORMAL HIGH (ref 70–99)
Potassium: 4.2 mmol/L (ref 3.5–5.1)
Sodium: 137 mmol/L (ref 135–145)
Total Bilirubin: 1 mg/dL (ref 0.3–1.2)
Total Protein: 6.9 g/dL (ref 6.5–8.1)

## 2022-08-03 MED ORDER — SODIUM CHLORIDE 0.9 % IV SOLN
1500.0000 mg | Freq: Once | INTRAVENOUS | Status: AC
  Administered 2022-08-03: 1500 mg via INTRAVENOUS
  Filled 2022-08-03: qty 30

## 2022-08-03 MED ORDER — SODIUM CHLORIDE 0.9 % IV SOLN: Freq: Once | INTRAVENOUS | Status: AC

## 2022-08-03 NOTE — Progress Notes (Signed)
Patient seen by Dr. Mohamed  Vitals are within treatment parameters.  Labs reviewed: and are within treatment parameters.  Per physician team, patient is ready for treatment and there are NO modifications to the treatment plan.  

## 2022-08-03 NOTE — Progress Notes (Signed)
[facility] Telephone:([phone]) [phone]   [phone]) [phone]  OFFICE PROGRESS NOTE  Kevin Specking, MD [address] [place] [address]  DIAGNOSIS: Stage IIIC (T3, N3, M0) non-small cell lung cancer, adenocarcinoma presented with right upper lobe lung mass status post right upper lobe wedge resection but no lymph node dissection.  The tumor measured 3.3 cm with visceral pleural involvement and very close resection margin less than 0.1 cm from the tumor.  This was performed on May 13, 2021 in the same setting of CABG under the care of Dr. Cliffton Asters.  PET scan performed in March 2023 showed a right upper lobe nodule adjacent to the wedge resection site small hypermetabolic right hilar, right subcarinal, and left infrahilar lymph nodes which could be reactive but are suspicious for nodal involvement.   Molecular studies showed no actionable mutations and PD-L1 expression was 0%   PRIOR THERAPY:  1) Right upper lobe wedge resection under the care of Dr. Cliffton Asters on 05/13/2021. 2) Concurrent chemoradiation with weekly carboplatin for AUC of 2 and paclitaxel 45 Mg/M2.  First dose Aug 11, 2021.  Status post 6 cycles.  Last dose was given September 22, 2021   CURRENT THERAPY: Consolidation treatment with immunotherapy with Imfinzi 1500 Mg IV every 4 weeks.  First dose October 27, 2021.  Status post 9 cycles.  INTERVAL HISTORY: Kevin [name] 82 y.o. male rectal to the clinic today for follow-up visit accompanied by his wife.  The patient is feeling fine today with no concerning complaints except for dry nose.  He denied having any current chest pain, shortness of breath, cough or hemoptysis.  He denied having any fever or chills.  He has no nausea, vomiting, diarrhea or constipation.  He has no headache or visual changes.  He has no recent weight loss or night sweats.  He is here today for evaluation with repeat CT scan of the chest for restaging of his disease.  MEDICAL HISTORY: Past Medical  History:  Diagnosis Date   Arthritis    Coronary artery disease    Hyperlipidemia    SCC (squamous cell carcinoma) 05/28/2004   top of left hand curet x3 71fu   SCC (squamous cell carcinoma) 09/13/2013   mid back tx cx3 55fu   SCC (squamous cell carcinoma) 09/06/2018   left wrist tx with bx   Squamous cell carcinoma of skin 11/16/2003   left ear tip tx curet and cautery 60fu    ALLERGIES:  has No Known Allergies.  MEDICATIONS:  Current Outpatient Medications  Medication Sig Dispense Refill   acetaminophen (TYLENOL) 500 MG tablet Take 500 mg by mouth daily as needed for moderate pain, fever or headache.     albuterol (PROVENTIL) (2.5 MG/3ML) 0.083% nebulizer solution Take 3 mLs (2.5 mg total) by nebulization every 6 (six) hours as needed for wheezing or shortness of breath. 360 mL 11   aspirin EC 81 MG tablet Take 81 mg by mouth daily.     atorvastatin (LIPITOR) 80 MG tablet TAKE 1 TABLET BY MOUTH EVERY DAY 90 tablet 3   benzonatate (TESSALON PERLES) 100 MG capsule Take 1 capsule (100 mg total) by mouth 2 (two) times daily as needed for cough. 60 capsule 0   Budeson-Glycopyrrol-Formoterol (BREZTRI AEROSPHERE) 160-9-4.8 MCG/ACT AERO Inhale 2 puffs into the lungs in the morning and at bedtime. 1 each 5   metoprolol tartrate (LOPRESSOR) 25 MG tablet TAKE 1 TABLET BY MOUTH TWICE A DAY 180 tablet 3  nitroGLYCERIN (NITROSTAT) 0.4 MG SL tablet Place 0.4 mg under the tongue every 5 (five) minutes as needed for chest pain.     predniSONE (DELTASONE) 10 MG tablet Take 10 mg by mouth 2 (two) times daily.     No current facility-administered medications for this visit.    SURGICAL HISTORY:  Past Surgical History:  Procedure Laterality Date   BRONCHIAL BIOPSY  07/22/2021   Procedure: BRONCHIAL BIOPSIES;  Surgeon: Leslye Peer, MD;  Location: [facility] ENDOSCOPY;  Service: Cardiopulmonary;;   BRONCHIAL BRUSHINGS  07/22/2021   Procedure: BRONCHIAL BRUSHINGS;  Surgeon: Leslye Peer, MD;  Location:  [facility] ENDOSCOPY;  Service: Cardiopulmonary;;   BRONCHIAL NEEDLE ASPIRATION BIOPSY  07/22/2021   Procedure: BRONCHIAL NEEDLE ASPIRATION BIOPSIES;  Surgeon: Leslye Peer, MD;  Location: [facility] ENDOSCOPY;  Service: Cardiopulmonary;;   CHOLECYSTECTOMY N/A 02/15/2022   Procedure: LAPAROSCOPIC CHOLECYSTECTOMY;  Surgeon: Lucretia Roers, MD;  Location: [facility] ORS;  Service: General;  Laterality: N/A;   CORONARY ARTERY BYPASS GRAFT N/A 05/13/2021   Procedure: CORONARY ARTERY BYPASS GRAFTING (CABG) TIMES 3  , ON PUMP, USING LEFT INTERNAL MAMMARY ARTERY AND RIGHT GREATER SAPHENOUS VEIN HARVESTED ENDOSCOPICALLY;  Surgeon: Corliss Skains, MD;  Location: [facility] OR;  Service: Open Heart Surgery;  Laterality: N/A;   ENDOVEIN HARVEST OF GREATER SAPHENOUS VEIN Right 05/13/2021   Procedure: ENDOVEIN HARVEST OF GREATER SAPHENOUS VEIN;  Surgeon: Corliss Skains, MD;  Location: [facility] OR;  Service: Open Heart Surgery;  Laterality: Right;   epidural     back pain   IR CT SPINE LTD  06/18/2020   IR KYPHO LUMBAR INC FX REDUCE BONE BX UNI/BIL CANNULATION INC/IMAGING  06/18/2020   LEFT HEART CATH AND CORONARY ANGIOGRAPHY N/A 05/06/2021   Procedure: LEFT HEART CATH AND CORONARY ANGIOGRAPHY;  Surgeon: Yates Decamp, MD;  Location: [facility] INVASIVE CV LAB;  Service: Cardiovascular;  Laterality: N/A;   LUNG BIOPSY Right 05/13/2021   Procedure: RIGHT LUNG BIOPSY;  Surgeon: Corliss Skains, MD;  Location: [facility] OR;  Service: Open Heart Surgery;  Laterality: Right;   MINOR HEMORRHOIDECTOMY     SHOULDER SURGERY     TEE WITHOUT CARDIOVERSION N/A 05/13/2021   Procedure: TRANSESOPHAGEAL ECHOCARDIOGRAM (TEE);  Surgeon: Corliss Skains, MD;  Location: [facility] OR;  Service: Open Heart Surgery;  Laterality: N/A;   TRANSURETHRAL RESECTION OF BLADDER TUMOR  2014   TRANSURETHRAL RESECTION OF BLADDER TUMOR N/A 05/19/2018   Procedure: TRANSURETHRAL RESECTION OF BLADDER TUMOR (TURBT) AND (INTRAVESICAL GEMCITABINE INSTILLED IN BLADDER IN PACU) ;  Surgeon:  Marcine Matar, MD;  Location: [facility] ORS;  Service: Urology;  Laterality: N/A;  30 MINS   VIDEO BRONCHOSCOPY WITH ENDOBRONCHIAL ULTRASOUND Bilateral 07/22/2021   Procedure: VIDEO BRONCHOSCOPY WITH ENDOBRONCHIAL ULTRASOUND;  Surgeon: Leslye Peer, MD;  Location: [facility] ENDOSCOPY;  Service: Cardiopulmonary;  Laterality: Bilateral;    REVIEW OF SYSTEMS:  Constitutional: negative Eyes: negative Ears, nose, mouth, throat, and face: negative Respiratory: negative Cardiovascular: negative Gastrointestinal: negative Genitourinary:negative Integument/breast: negative Hematologic/lymphatic: negative Musculoskeletal:negative Neurological: negative Behavioral/Psych: negative Endocrine: negative Allergic/Immunologic: negative   PHYSICAL EXAMINATION: General appearance: alert, cooperative, and no distress Head: Normocephalic, without obvious abnormality, atraumatic Neck: no adenopathy, no JVD, supple, symmetrical, trachea midline, and thyroid not enlarged, symmetric, no tenderness/mass/nodules Lymph nodes: Cervical, supraclavicular, and axillary nodes normal. Resp: clear to auscultation bilaterally Back: symmetric, no curvature. ROM normal. No CVA tenderness. Cardio: regular rate and rhythm, S1, S2 normal, no murmur, click, rub or gallop GI: soft, non-tender; bowel sounds normal; no masses,  no organomegaly Extremities: extremities normal, atraumatic, no cyanosis or edema Neurologic: Alert and oriented X 3, normal strength and tone. Normal symmetric reflexes. Normal coordination and gait  ECOG PERFORMANCE STATUS: 0 - Asymptomatic  Blood pressure 105/67, pulse 79, temperature 97.8 F (36.6 C), temperature source Oral, resp. rate 15, weight 164 lb 1.6 oz (74.4 kg), SpO2 99 %.  LABORATORY DATA: Lab Results  Component Value Date   WBC 6.7 08/03/2022   HGB 12.2 (L) 08/03/2022   HCT 36.5 (L) 08/03/2022   MCV 91.3 08/03/2022   PLT 177 08/03/2022      Chemistry      Component Value Date/Time    NA 141 07/06/2022 1258   NA 139 05/01/2021 1146   K 4.1 07/06/2022 1258   CL 105 07/06/2022 1258   CO2 29 07/06/2022 1258   BUN 18 07/06/2022 1258   BUN 20 05/01/2021 1146   CREATININE 1.37 (H) 07/06/2022 1258      Component Value Date/Time   CALCIUM 9.3 07/06/2022 1258   ALKPHOS 79 07/06/2022 1258   AST 11 (L) 07/06/2022 1258   ALT 8 07/06/2022 1258   BILITOT 0.7 07/06/2022 1258       RADIOGRAPHIC STUDIES: CT Chest W Contrast  Result Date: 08/02/2022 CLINICAL DATA:  Non-small cell stage III C right upper lobe lung cancer. Right upper lobe wedge resection. Prior chemoradiation therapy with ongoing consolidation immunotherapy. Restaging. * Tracking Code: BO * EXAM: CT CHEST WITH CONTRAST TECHNIQUE: Multidetector CT imaging of the chest was performed during intravenous contrast administration. RADIATION DOSE REDUCTION: This exam was performed according to the departmental dose-optimization program which includes automated exposure control, adjustment of the mA and/or kV according to patient size and/or use of iterative reconstruction technique. CONTRAST:  75mL OMNIPAQUE IOHEXOL 300 MG/ML  SOLN COMPARISON:  05/04/2022 chest CT. FINDINGS: Cardiovascular: Normal heart size. No significant pericardial effusion/thickening. Three-vessel coronary atherosclerosis. Atherosclerotic nonaneurysmal thoracic aorta. Normal caliber pulmonary arteries. No central pulmonary emboli. Mediastinum/Nodes: No significant thyroid nodules. Unremarkable esophagus. No pathologically enlarged axillary, mediastinal or hilar lymph nodes. Lungs/Pleura: No pneumothorax. Trace posterior pleural effusions bilaterally, minimally increased bilaterally. Moderate paraseptal and mild centrilobular emphysema with diffuse bronchial wall thickening. Stable postsurgical changes from posterior peripheral right upper lobe wedge resection. Sharply marginated bilateral perihilar lung consolidation with associated volume loss,  bronchiectasis, volume loss and distortion, unchanged, compatible with radiation fibrosis. Nodular consolidation in the posterior right upper lobe abutting the suture line measures 2.5 x 1.1 cm, previously 2.7 x 1.1 cm using similar measurement technique, stable to slightly decreased. No acute consolidative airspace disease. No new significant pulmonary nodules. Previously described 0.6 cm indistinct posterior left lower lobe pulmonary nodule from 05/04/2022 CT is absent on today's scan. Upper abdomen: Cholecystectomy. Nonobstructing 3 mm upper right renal stone. Musculoskeletal: No aggressive appearing focal osseous lesions. Intact sternotomy wires. Mild thoracic spondylosis. IMPRESSION: 1. No evidence of recurrent metastatic disease in the chest. Previously described 0.6 cm left lower lobe nodule on 05/04/2022 CT has resolved. 2. Treated right upper lobe pulmonary nodule adjacent to the wedge resection suture line is stable to slightly decreased. 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 3. Trace posterior pleural effusions bilaterally, minimally increased bilaterally. 4. Three-vessel coronary atherosclerosis. 5. Nonobstructing right nephrolithiasis. 6. Aortic Atherosclerosis (ICD10-I70.0) and Emphysema (ICD10-J43.9). Electronically Signed   By: Delbert Phenix M.D.   On: 08/02/2022 15:06    ASSESSMENT AND PLAN: This is a very pleasant 82 years old white male diagnosed with stage IIIC (T3, N3, M0)  non-small cell lung cancer, adenocarcinoma with no actionable mutations and PD-L1 expression of 0% diagnosed in January 2023 status post wedge resection of the right upper lobe lung mass in a setting of CABG under the care of Dr. Cliffton Asters but the PET scan showed residual disease in the right upper lobe in addition to hypermetabolic right hilar, subcarinal and left infrahilar lymph nodes. The patient was seen by Dr. Delton Coombes and biopsy of the right upper lobe as well as the subcarinal and left hilar lymph nodes were  positive for malignancy consistent with non-small cell lung cancer, adenocarcinoma. He underwent a course of concurrent chemoradiation with weekly carboplatin for AUC of 2 and paclitaxel 45 Mg/M2 status post 6 cycles.  The patient tolerated this previous course of concurrent chemoradiation fairly well except for fatigue and mild odynophagia. The patient is currently undergoing consolidation treatment with immunotherapy with Imfinzi 1500 Mg IV every 4 weeks status post 9 cycles.    The patient has been tolerating this treatment well with no concerning adverse effects. He had repeat CT scan of the chest performed recently.  I personally and independently reviewed the scan and discussed results with the patient and his wife. His scan showed no concerning findings for disease progression. I recommended for him to continue his current consolidation treatment with immunotherapy and he will proceed with cycle #10 today. He will come back for follow-up visit in 4 weeks for evaluation before starting cycle #11. He was advised to call immediately if he has any other concerning symptoms in the interval. The patient voices understanding of current disease status and treatment options and is in agreement with the current care plan. All questions were answered. The patient knows to call the clinic with any problems, questions or concerns. We can certainly see the patient much sooner if necessary. The total time spent in the appointment was 30 minutes.   Disclaimer: This note was dictated with voice recognition software. Similar sounding words can inadvertently be transcribed and may not be corrected upon review.

## 2022-08-03 NOTE — Patient Instructions (Signed)
[facility]  Discharge Instructions: Thank you for choosing [facility] to provide your oncology and hematology care.   If you have a lab appointment with the Cancer Center, please go directly to the Cancer Center and check in at the registration area.   Wear comfortable clothing and clothing appropriate for easy access to any Portacath or PICC line.   We strive to give you quality time with your provider. You may need to reschedule your appointment if you arrive late (15 or more minutes).  Arriving late affects you and other patients whose appointments are after yours.  Also, if you miss three or more appointments without notifying the office, you may be dismissed from the clinic at the provider's discretion.      For prescription refill requests, have your pharmacy contact our office and allow 72 hours for refills to be completed.    Today you received the following chemotherapy and/or immunotherapy agents: durvalumab        To help prevent nausea and vomiting after your treatment, we encourage you to take your nausea medication as directed.  BELOW ARE SYMPTOMS THAT SHOULD BE REPORTED IMMEDIATELY: *FEVER GREATER THAN 100.4 F (38 C) OR HIGHER *CHILLS OR SWEATING *NAUSEA AND VOMITING THAT IS NOT CONTROLLED WITH YOUR NAUSEA MEDICATION *UNUSUAL SHORTNESS OF BREATH *UNUSUAL BRUISING OR BLEEDING *URINARY PROBLEMS (pain or burning when urinating, or frequent urination) *BOWEL PROBLEMS (unusual diarrhea, constipation, pain near the anus) TENDERNESS IN MOUTH AND THROAT WITH OR WITHOUT PRESENCE OF ULCERS (sore throat, sores in mouth, or a toothache) UNUSUAL RASH, SWELLING OR PAIN  UNUSUAL VAGINAL DISCHARGE OR ITCHING   Items with * indicate a potential emergency and should be followed up as soon as possible or go to the Emergency Department if any problems should occur.  Please show the CHEMOTHERAPY ALERT CARD or IMMUNOTHERAPY ALERT CARD at  check-in to the Emergency Department and triage nurse.  Should you have questions after your visit or need to cancel or reschedule your appointment, please contact [facility]  Dept: [phone]  and follow the prompts.  Office hours are 8:00 a.m. to 4:30 p.m. Monday - Friday. Please note that voicemails left after 4:00 p.m. may not be returned until the following business day.  We are closed weekends and major holidays. You have access to a nurse at all times for urgent questions. Please call the main number to the clinic Dept: [phone] and follow the prompts.   For any non-urgent questions, you may also contact your provider using MyChart. We now offer e-Visits for anyone 18 and older to request care online for non-urgent symptoms. For details visit [email or web address].   Also download the MyChart app! Go to the app store, search "MyChart", open the app, select [facility], and log in with your MyChart username and password.   

## 2022-08-04 ENCOUNTER — Telehealth: Payer: Self-pay | Admitting: Internal Medicine

## 2022-08-04 NOTE — Telephone Encounter (Signed)
Patient called to verify time and date of appointments. 

## 2022-08-26 ENCOUNTER — Telehealth: Payer: Self-pay

## 2022-08-26 NOTE — Telephone Encounter (Signed)
He can stop for 2 weeks and see if the cough goes away and re challenge again and see if cough comes back

## 2022-08-26 NOTE — Telephone Encounter (Signed)
Patient was instructed per Dr. Verl Dicker recommendation for cough. He will call us back and 2 weeks and let us know if he still has the cough. He verbalized understanding.

## 2022-08-26 NOTE — Telephone Encounter (Signed)
Patient feels that Atorvastatin is giving him a cough. Says it is constant. He wants to know if you can change this medication to something else?

## 2022-08-31 ENCOUNTER — Inpatient Hospital Stay (HOSPITAL_BASED_OUTPATIENT_CLINIC_OR_DEPARTMENT_OTHER): Payer: Medicare HMO | Admitting: Internal Medicine

## 2022-08-31 ENCOUNTER — Encounter: Payer: Self-pay | Admitting: Medical Oncology

## 2022-08-31 ENCOUNTER — Inpatient Hospital Stay: Payer: Medicare HMO

## 2022-08-31 ENCOUNTER — Inpatient Hospital Stay: Payer: Medicare HMO | Attending: Internal Medicine

## 2022-08-31 VITALS — BP 125/97 | HR 73 | Resp 14

## 2022-08-31 DIAGNOSIS — Z7982 Long term (current) use of aspirin: Secondary | ICD-10-CM | POA: Diagnosis not present

## 2022-08-31 DIAGNOSIS — Z5112 Encounter for antineoplastic immunotherapy: Secondary | ICD-10-CM | POA: Insufficient documentation

## 2022-08-31 DIAGNOSIS — C3411 Malignant neoplasm of upper lobe, right bronchus or lung: Secondary | ICD-10-CM | POA: Insufficient documentation

## 2022-08-31 DIAGNOSIS — C3491 Malignant neoplasm of unspecified part of right bronchus or lung: Secondary | ICD-10-CM

## 2022-08-31 DIAGNOSIS — Z79899 Other long term (current) drug therapy: Secondary | ICD-10-CM | POA: Diagnosis not present

## 2022-08-31 LAB — CBC WITH DIFFERENTIAL (CANCER CENTER ONLY)
Abs Immature Granulocytes: 0.02 10*3/uL (ref 0.00–0.07)
Basophils Absolute: 0 10*3/uL (ref 0.0–0.1)
Basophils Relative: 1 %
Eosinophils Absolute: 0.3 10*3/uL (ref 0.0–0.5)
Eosinophils Relative: 5 %
HCT: 36.8 % — ABNORMAL LOW (ref 39.0–52.0)
Hemoglobin: 12.5 g/dL — ABNORMAL LOW (ref 13.0–17.0)
Immature Granulocytes: 0 %
Lymphocytes Relative: 23 %
Lymphs Abs: 1.3 10*3/uL (ref 0.7–4.0)
MCH: 31.3 pg (ref 26.0–34.0)
MCHC: 34 g/dL (ref 30.0–36.0)
MCV: 92.2 fL (ref 80.0–100.0)
Monocytes Absolute: 0.6 10*3/uL (ref 0.1–1.0)
Monocytes Relative: 11 %
Neutro Abs: 3.4 10*3/uL (ref 1.7–7.7)
Neutrophils Relative %: 60 %
Platelet Count: 168 10*3/uL (ref 150–400)
RBC: 3.99 MIL/uL — ABNORMAL LOW (ref 4.22–5.81)
RDW: 13.5 % (ref 11.5–15.5)
WBC Count: 5.6 10*3/uL (ref 4.0–10.5)
nRBC: 0 % (ref 0.0–0.2)

## 2022-08-31 LAB — CMP (CANCER CENTER ONLY)
ALT: 7 U/L (ref 0–44)
AST: 11 U/L — ABNORMAL LOW (ref 15–41)
Albumin: 4 g/dL (ref 3.5–5.0)
Alkaline Phosphatase: 86 U/L (ref 38–126)
Anion gap: 7 (ref 5–15)
BUN: 17 mg/dL (ref 8–23)
CO2: 26 mmol/L (ref 22–32)
Calcium: 9.2 mg/dL (ref 8.9–10.3)
Chloride: 104 mmol/L (ref 98–111)
Creatinine: 1.26 mg/dL — ABNORMAL HIGH (ref 0.61–1.24)
GFR, Estimated: 58 mL/min — ABNORMAL LOW (ref >60)
Glucose, Bld: 96 mg/dL (ref 70–99)
Potassium: 4.6 mmol/L (ref 3.5–5.1)
Sodium: 137 mmol/L (ref 135–145)
Total Bilirubin: 0.8 mg/dL (ref 0.3–1.2)
Total Protein: 6.8 g/dL (ref 6.5–8.1)

## 2022-08-31 MED ORDER — SODIUM CHLORIDE 0.9 % IV SOLN
1500.0000 mg | Freq: Once | INTRAVENOUS | Status: AC
  Administered 2022-08-31: 1500 mg via INTRAVENOUS
  Filled 2022-08-31: qty 30

## 2022-08-31 MED ORDER — SODIUM CHLORIDE 0.9 % IV SOLN: Freq: Once | INTRAVENOUS | Status: AC

## 2022-08-31 NOTE — Patient Instructions (Signed)
[facility]  Discharge Instructions: Thank you for choosing [facility] to provide your oncology and hematology care.   If you have a lab appointment with the Cancer Center, please go directly to the Cancer Center and check in at the registration area.   Wear comfortable clothing and clothing appropriate for easy access to any Portacath or PICC line.   We strive to give you quality time with your provider. You may need to reschedule your appointment if you arrive late (15 or more minutes).  Arriving late affects you and other patients whose appointments are after yours.  Also, if you miss three or more appointments without notifying the office, you may be dismissed from the clinic at the provider's discretion.      For prescription refill requests, have your pharmacy contact our office and allow 72 hours for refills to be completed.    Today you received the following chemotherapy and/or immunotherapy agents: durvalumab        To help prevent nausea and vomiting after your treatment, we encourage you to take your nausea medication as directed.  BELOW ARE SYMPTOMS THAT SHOULD BE REPORTED IMMEDIATELY: *FEVER GREATER THAN 100.4 F (38 C) OR HIGHER *CHILLS OR SWEATING *NAUSEA AND VOMITING THAT IS NOT CONTROLLED WITH YOUR NAUSEA MEDICATION *UNUSUAL SHORTNESS OF BREATH *UNUSUAL BRUISING OR BLEEDING *URINARY PROBLEMS (pain or burning when urinating, or frequent urination) *BOWEL PROBLEMS (unusual diarrhea, constipation, pain near the anus) TENDERNESS IN MOUTH AND THROAT WITH OR WITHOUT PRESENCE OF ULCERS (sore throat, sores in mouth, or a toothache) UNUSUAL RASH, SWELLING OR PAIN  UNUSUAL VAGINAL DISCHARGE OR ITCHING   Items with * indicate a potential emergency and should be followed up as soon as possible or go to the Emergency Department if any problems should occur.  Please show the CHEMOTHERAPY ALERT CARD or IMMUNOTHERAPY ALERT CARD at  check-in to the Emergency Department and triage nurse.  Should you have questions after your visit or need to cancel or reschedule your appointment, please contact [facility]  Dept: [phone]  and follow the prompts.  Office hours are 8:00 a.m. to 4:30 p.m. Monday - Friday. Please note that voicemails left after 4:00 p.m. may not be returned until the following business day.  We are closed weekends and major holidays. You have access to a nurse at all times for urgent questions. Please call the main number to the clinic Dept: [phone] and follow the prompts.   For any non-urgent questions, you may also contact your provider using MyChart. We now offer e-Visits for anyone 18 and older to request care online for non-urgent symptoms. For details visit [email or web address].   Also download the MyChart app! Go to the app store, search "MyChart", open the app, select [facility], and log in with your MyChart username and password.   

## 2022-08-31 NOTE — Progress Notes (Signed)
Patient seen by Dr. Mohamed  Vitals are within treatment parameters.   Labs reviewed: and are within treatment parameters.  Per physician team, patient is ready for treatment and there are NO modifications to the treatment plan.  Durvalumab 

## 2022-08-31 NOTE — Progress Notes (Signed)
[facility] [phone]   Fax:([phone]) [phone]  OFFICE PROGRESS NOTE  [name], MD [address] [place] [address]  DIAGNOSIS: Stage IIIC (T3, N3, M0) non-small cell lung cancer, adenocarcinoma presented with right upper lobe lung mass status post right upper lobe wedge resection but no lymph node dissection.  The tumor measured 3.3 cm with visceral pleural involvement and very close resection margin less than 0.1 cm from the tumor.  This was performed on May 13, 2021 in the same setting of CABG under the care of Dr. Cliffton Asters.  PET scan performed in March 2023 showed a right upper lobe nodule adjacent to the wedge resection site small hypermetabolic right hilar, right subcarinal, and left infrahilar lymph nodes which could be reactive but are suspicious for nodal involvement.   Molecular studies showed no actionable mutations and PD-L1 expression was 0%   PRIOR THERAPY:  1) Right upper lobe wedge resection under the care of Dr. Cliffton Asters on 05/13/2021. 2) Concurrent chemoradiation with weekly carboplatin for AUC of 2 and paclitaxel 45 Mg/M2.  First dose Aug 11, 2021.  Status post 6 cycles.  Last dose was given September 22, 2021   CURRENT THERAPY: Consolidation treatment with immunotherapy with Imfinzi 1500 Mg IV every 4 weeks.  First dose October 27, 2021.  Status post 10 cycles.  INTERVAL HISTORY: Kevin [name] 82 y.o. male returns to the clinic today for follow-up visit accompanied by his wife.  The patient is feeling fine today with no concerning complaints.  He denied having any current chest pain, shortness of breath, cough or hemoptysis.  He has no nausea, vomiting, diarrhea or constipation.  He has no headache or visual changes.  He denied having any fever or chills.  He has been tolerating his treatment with immunotherapy fairly well.  He is here today for evaluation before starting cycle #11.   MEDICAL HISTORY: Past Medical History:  Diagnosis Date    Arthritis    Coronary artery disease    Hyperlipidemia    SCC (squamous cell carcinoma) 05/28/2004   top of left hand curet x3 48fu   SCC (squamous cell carcinoma) 09/13/2013   mid back tx cx3 54fu   SCC (squamous cell carcinoma) 09/06/2018   left wrist tx with bx   Squamous cell carcinoma of skin 11/16/2003   left ear tip tx curet and cautery 20fu    ALLERGIES:  has No Known Allergies.  MEDICATIONS:  Current Outpatient Medications  Medication Sig Dispense Refill   acetaminophen (TYLENOL) 500 MG tablet Take 500 mg by mouth daily as needed for moderate pain, fever or headache.     albuterol (PROVENTIL) (2.5 MG/3ML) 0.083% nebulizer solution Take 3 mLs (2.5 mg total) by nebulization every 6 (six) hours as needed for wheezing or shortness of breath. 360 mL 11   aspirin EC 81 MG tablet Take 81 mg by mouth daily.     atorvastatin (LIPITOR) 80 MG tablet TAKE 1 TABLET BY MOUTH EVERY DAY 90 tablet 3   benzonatate (TESSALON PERLES) 100 MG capsule Take 1 capsule (100 mg total) by mouth 2 (two) times daily as needed for cough. 60 capsule 0   Budeson-Glycopyrrol-Formoterol (BREZTRI AEROSPHERE) 160-9-4.8 MCG/ACT AERO Inhale 2 puffs into the lungs in the morning and at bedtime. 1 each 5   metoprolol tartrate (LOPRESSOR) 25 MG tablet TAKE 1 TABLET BY MOUTH TWICE A DAY 180 tablet 3   nitroGLYCERIN (NITROSTAT) 0.4 MG SL tablet Place 0.4 mg  under the tongue every 5 (five) minutes as needed for chest pain.     No current facility-administered medications for this visit.    SURGICAL HISTORY:  Past Surgical History:  Procedure Laterality Date   BRONCHIAL BIOPSY  07/22/2021   Procedure: BRONCHIAL BIOPSIES;  Surgeon: Leslye Peer, MD;  Location: [facility] ENDOSCOPY;  Service: Cardiopulmonary;;   BRONCHIAL BRUSHINGS  07/22/2021   Procedure: BRONCHIAL BRUSHINGS;  Surgeon: Leslye Peer, MD;  Location: [facility] ENDOSCOPY;  Service: Cardiopulmonary;;   BRONCHIAL NEEDLE ASPIRATION BIOPSY  07/22/2021   Procedure:  BRONCHIAL NEEDLE ASPIRATION BIOPSIES;  Surgeon: Leslye Peer, MD;  Location: [facility] ENDOSCOPY;  Service: Cardiopulmonary;;   CHOLECYSTECTOMY N/A 02/15/2022   Procedure: LAPAROSCOPIC CHOLECYSTECTOMY;  Surgeon: Lucretia Roers, MD;  Location: [facility] ORS;  Service: General;  Laterality: N/A;   CORONARY ARTERY BYPASS GRAFT N/A 05/13/2021   Procedure: CORONARY ARTERY BYPASS GRAFTING (CABG) TIMES 3  , ON PUMP, USING LEFT INTERNAL MAMMARY ARTERY AND RIGHT GREATER SAPHENOUS VEIN HARVESTED ENDOSCOPICALLY;  Surgeon: Corliss Skains, MD;  Location: [facility] OR;  Service: Open Heart Surgery;  Laterality: N/A;   ENDOVEIN HARVEST OF GREATER SAPHENOUS VEIN Right 05/13/2021   Procedure: ENDOVEIN HARVEST OF GREATER SAPHENOUS VEIN;  Surgeon: Corliss Skains, MD;  Location: [facility] OR;  Service: Open Heart Surgery;  Laterality: Right;   epidural     back pain   IR CT SPINE LTD  06/18/2020   IR KYPHO LUMBAR INC FX REDUCE BONE BX UNI/BIL CANNULATION INC/IMAGING  06/18/2020   LEFT HEART CATH AND CORONARY ANGIOGRAPHY N/A 05/06/2021   Procedure: LEFT HEART CATH AND CORONARY ANGIOGRAPHY;  Surgeon: Yates Decamp, MD;  Location: [facility] INVASIVE CV LAB;  Service: Cardiovascular;  Laterality: N/A;   LUNG BIOPSY Right 05/13/2021   Procedure: RIGHT LUNG BIOPSY;  Surgeon: Corliss Skains, MD;  Location: [facility] OR;  Service: Open Heart Surgery;  Laterality: Right;   MINOR HEMORRHOIDECTOMY     SHOULDER SURGERY     TEE WITHOUT CARDIOVERSION N/A 05/13/2021   Procedure: TRANSESOPHAGEAL ECHOCARDIOGRAM (TEE);  Surgeon: Corliss Skains, MD;  Location: [facility] OR;  Service: Open Heart Surgery;  Laterality: N/A;   TRANSURETHRAL RESECTION OF BLADDER TUMOR  2014   TRANSURETHRAL RESECTION OF BLADDER TUMOR N/A 05/19/2018   Procedure: TRANSURETHRAL RESECTION OF BLADDER TUMOR (TURBT) AND (INTRAVESICAL GEMCITABINE INSTILLED IN BLADDER IN PACU) ;  Surgeon: Marcine Matar, MD;  Location: [facility] ORS;  Service: Urology;  Laterality: N/A;  30 MINS   VIDEO BRONCHOSCOPY  WITH ENDOBRONCHIAL ULTRASOUND Bilateral 07/22/2021   Procedure: VIDEO BRONCHOSCOPY WITH ENDOBRONCHIAL ULTRASOUND;  Surgeon: Leslye Peer, MD;  Location[facility] ENDOSCOPY;  Service: Cardiopulmonary;  Laterality: Bilateral;    REVIEW OF SYSTEMS:  A comprehensive review of systems was negative.   PHYSICAL EXAMINATION: General appearance: alert, cooperative, and no distress Head: Normocephalic, without obvious abnormality, atraumatic Neck: no adenopathy, no JVD, supple, symmetrical, trachea midline, and thyroid not enlarged, symmetric, no tenderness/mass/nodules Lymph nodes: Cervical, supraclavicular, and axillary nodes normal. Resp: clear to auscultation bilaterally Back: symmetric, no curvature. ROM normal. No CVA tenderness. Cardio: regular rate and rhythm, S1, S2 normal, no murmur, click, rub or gallop GI: soft, non-tender; bowel sounds normal; no masses,  no organomegaly Extremities: extremities normal, atraumatic, no cyanosis or edema  ECOG PERFORMANCE STATUS: 0 - Asymptomatic  Blood pressure 122/80, pulse 74, temperature 97.7 F (36.5 C), temperature source Oral, resp. rate 14, weight 164 lb 12.8 oz (74.8 kg), SpO2 99 %.  LABORATORY DATA: Lab Results  Component  Value Date   WBC 6.7 08/03/2022   HGB 12.2 (L) 08/03/2022   HCT 36.5 (L) 08/03/2022   MCV 91.3 08/03/2022   PLT 177 08/03/2022      Chemistry      Component Value Date/Time   NA 137 08/03/2022 0956   NA 139 05/01/2021 1146   K 4.2 08/03/2022 0956   CL 104 08/03/2022 0956   CO2 26 08/03/2022 0956   BUN 16 08/03/2022 0956   BUN 20 05/01/2021 1146   CREATININE 1.27 (H) 08/03/2022 0956      Component Value Date/Time   CALCIUM 9.5 08/03/2022 0956   ALKPHOS 76 08/03/2022 0956   AST 11 (L) 08/03/2022 0956   ALT 6 08/03/2022 0956   BILITOT 1.0 08/03/2022 0956       RADIOGRAPHIC STUDIES: No results found.  ASSESSMENT AND PLAN: This is a very pleasant 82 years old white male diagnosed with stage IIIC (T3, N3, M0)  non-small cell lung cancer, adenocarcinoma with no actionable mutations and PD-L1 expression of 0% diagnosed in January 2023 status post wedge resection of the right upper lobe lung mass in a setting of CABG under the care of Dr. Cliffton Asters but the PET scan showed residual disease in the right upper lobe in addition to hypermetabolic right hilar, subcarinal and left infrahilar lymph nodes. The patient was seen by Dr. Delton Coombes and biopsy of the right upper lobe as well as the subcarinal and left hilar lymph nodes were positive for malignancy consistent with non-small cell lung cancer, adenocarcinoma. He underwent a course of concurrent chemoradiation with weekly carboplatin for AUC of 2 and paclitaxel 45 Mg/M2 status post 6 cycles.  The patient tolerated this previous course of concurrent chemoradiation fairly well except for fatigue and mild odynophagia. The patient is currently undergoing consolidation treatment with immunotherapy with Imfinzi 1500 Mg IV every 4 weeks status post 10 cycles.    The patient has been tolerating his treatment with immunotherapy fairly well. I recommended for him to proceed with cycle #11 today as planned. I will see him back for follow-up visit in 4 weeks for evaluation before the next cycle of his treatment. He was advised to call immediately if he has any other concerning symptoms in the interval. The patient voices understanding of current disease status and treatment options and is in agreement with the current care plan. All questions were answered. The patient knows to call the clinic with any problems, questions or concerns. We can certainly see the patient much sooner if necessary. The total time spent in the appointment was 20 minutes.   Disclaimer: This note was dictated with voice recognition software. Similar sounding words can inadvertently be transcribed and may not be corrected upon review.

## 2022-09-01 DIAGNOSIS — Z299 Encounter for prophylactic measures, unspecified: Secondary | ICD-10-CM | POA: Diagnosis not present

## 2022-09-01 DIAGNOSIS — R002 Palpitations: Secondary | ICD-10-CM | POA: Diagnosis not present

## 2022-09-01 DIAGNOSIS — I10 Essential (primary) hypertension: Secondary | ICD-10-CM | POA: Diagnosis not present

## 2022-09-01 DIAGNOSIS — I25119 Atherosclerotic heart disease of native coronary artery with unspecified angina pectoris: Secondary | ICD-10-CM | POA: Diagnosis not present

## 2022-09-10 ENCOUNTER — Telehealth: Payer: Self-pay | Admitting: Internal Medicine

## 2022-09-10 NOTE — Telephone Encounter (Signed)
Patient is aware of next 2 appointments. also responsed to voicemail that was made on 09/10/2022

## 2022-09-14 ENCOUNTER — Encounter: Payer: Self-pay | Admitting: Cardiology

## 2022-09-14 ENCOUNTER — Telehealth: Payer: Self-pay

## 2022-09-14 DIAGNOSIS — I2510 Atherosclerotic heart disease of native coronary artery without angina pectoris: Secondary | ICD-10-CM

## 2022-09-14 DIAGNOSIS — E7800 Pure hypercholesterolemia, unspecified: Secondary | ICD-10-CM

## 2022-09-14 MED ORDER — ROSUVASTATIN CALCIUM 20 MG PO TABS
20.0000 mg | ORAL_TABLET | Freq: Every day | ORAL | 3 refills | Status: DC
Start: 2022-09-14 — End: 2023-01-18

## 2022-09-14 NOTE — Telephone Encounter (Signed)
Patient called and said he has been off atorvastatin for 2 weeks now and his cough has subsided. He wanted to know if you were going to prescribe anything else for him?

## 2022-09-14 NOTE — Telephone Encounter (Signed)
Please let him know I will be sending for Crestor 20 mg daily, he was ordered labs, he needs to get them done in 4 to 6 weeks at [facility]

## 2022-09-14 NOTE — Telephone Encounter (Signed)
ICD-10-CM   1. Coronary artery disease involving native coronary artery of native heart without angina pectoris  I25.10 rosuvastatin (CRESTOR) 20 MG tablet    2. Hypercholesteremia  E78.00 rosuvastatin (CRESTOR) 20 MG tablet     Meds ordered this encounter  Medications   rosuvastatin (CRESTOR) 20 MG tablet    Sig: Take 1 tablet (20 mg total) by mouth daily.    Dispense:  90 tablet    Refill:  3

## 2022-09-15 ENCOUNTER — Other Ambulatory Visit: Payer: Self-pay

## 2022-09-15 NOTE — Telephone Encounter (Signed)
Patient aware.

## 2022-09-21 NOTE — Progress Notes (Signed)
History of Present Illness: Here for bladder cancer surveillance.     4.24.2017: TURBT of a 2 cm low-grade nonmuscle invasive bladder tumor by Dr. Nechama Guard.  Repeat resection for recurrence in August 2017.  Mitomycin was placed after each procedure.   2.6.2020: Repeat TURBT/gemcitabine for 15 mm right posterior bladder wall lesion, low-grade nonmuscle invasive on pathology review.   5.19.2019:  UroLift for BPH symptoms.  6 implants were placed.   10.25.2022: Now back on tamsulosin because of nocturia x3-4.  No gross hematuria.  Tamsulosin has improved his nighttime voiding.  He does drink coffee in the evening.   5.23.2023: He was recently diagnosed with severe CAD, underwent urgent bypass by Dr. Cliffton Asters.  Part of the evaluation included preoperative chest CT which revealed probable carcinoma of the right upper lobe.  He had a resection at the same time.  He has stage III adenocarcinoma.  He is getting chemo/radiotherapy now.   He is not currently on tamsulosin, voiding fairly well.  PET CT scan also revealed bladder calculi.   1.23.2024: Cysto negative. Bladder calculi on PET not seen.  6.11.2024: No voiding complaints, no gross hematuria.  Has not passed any stones.   Past Medical History:  Diagnosis Date   Arthritis    Coronary artery disease    Hyperlipidemia    SCC (squamous cell carcinoma) 05/28/2004   top of left hand curet x3 21fu   SCC (squamous cell carcinoma) 09/13/2013   mid back tx cx3 9fu   SCC (squamous cell carcinoma) 09/06/2018   left wrist tx with bx   Squamous cell carcinoma of skin 11/16/2003   left ear tip tx curet and cautery 70fu    Past Surgical History:  Procedure Laterality Date   BRONCHIAL BIOPSY  07/22/2021   Procedure: BRONCHIAL BIOPSIES;  Surgeon: Leslye Peer, MD;  Location: [facility] ENDOSCOPY;  Service: Cardiopulmonary;;   BRONCHIAL BRUSHINGS  07/22/2021   Procedure: BRONCHIAL BRUSHINGS;  Surgeon: Leslye Peer, MD;  Location[facility] ENDOSCOPY;  Service:  Cardiopulmonary;;   BRONCHIAL NEEDLE ASPIRATION BIOPSY  07/22/2021   Procedure: BRONCHIAL NEEDLE ASPIRATION BIOPSIES;  Surgeon: Leslye Peer, MD;  Location: [facility] ENDOSCOPY;  Service: Cardiopulmonary;;   CHOLECYSTECTOMY N/A 02/15/2022   Procedure: LAPAROSCOPIC CHOLECYSTECTOMY;  Surgeon: Lucretia Roers, MD;  Location: [facility] ORS;  Service: General;  Laterality: N/A;   CORONARY ARTERY BYPASS GRAFT N/A 05/13/2021   Procedure: CORONARY ARTERY BYPASS GRAFTING (CABG) TIMES 3  , ON PUMP, USING LEFT INTERNAL MAMMARY ARTERY AND RIGHT GREATER SAPHENOUS VEIN HARVESTED ENDOSCOPICALLY;  Surgeon: Corliss Skains, MD;  Location: [facility] OR;  Service: Open Heart Surgery;  Laterality: N/A;   ENDOVEIN HARVEST OF GREATER SAPHENOUS VEIN Right 05/13/2021   Procedure: ENDOVEIN HARVEST OF GREATER SAPHENOUS VEIN;  Surgeon: Corliss Skains, MD;  Location: [facility] OR;  Service: Open Heart Surgery;  Laterality: Right;   epidural     back pain   IR CT SPINE LTD  06/18/2020   IR KYPHO LUMBAR INC FX REDUCE BONE BX UNI/BIL CANNULATION INC/IMAGING  06/18/2020   LEFT HEART CATH AND CORONARY ANGIOGRAPHY N/A 05/06/2021   Procedure: LEFT HEART CATH AND CORONARY ANGIOGRAPHY;  Surgeon: Yates Decamp, MD;  [facility]: [facility] INVASIVE CV LAB;  Service: Cardiovascular;  Laterality: N/A;   LUNG BIOPSY Right 05/13/2021   Procedure: RIGHT LUNG BIOPSY;  Surgeon: Corliss Skains, MD;  Location: [facility] OR;  Service: Open Heart Surgery;  Laterality: Right;   MINOR HEMORRHOIDECTOMY     SHOULDER SURGERY  TEE WITHOUT CARDIOVERSION N/A 05/13/2021   Procedure: TRANSESOPHAGEAL ECHOCARDIOGRAM (TEE);  Surgeon: Corliss Skains, MD;  Location: [facility] OR;  Service: Open Heart Surgery;  Laterality: N/A;   TRANSURETHRAL RESECTION OF BLADDER TUMOR  2014   TRANSURETHRAL RESECTION OF BLADDER TUMOR N/A 05/19/2018   Procedure: TRANSURETHRAL RESECTION OF BLADDER TUMOR (TURBT) AND (INTRAVESICAL GEMCITABINE INSTILLED IN BLADDER IN PACU) ;  Surgeon: Marcine Matar, MD;   Location: [facility] ORS;  Service: Urology;  Laterality: N/A;  30 MINS   VIDEO BRONCHOSCOPY WITH ENDOBRONCHIAL ULTRASOUND Bilateral 07/22/2021   Procedure: VIDEO BRONCHOSCOPY WITH ENDOBRONCHIAL ULTRASOUND;  Surgeon: Leslye Peer, MD;  Location: [facility] ENDOSCOPY;  Service: Cardiopulmonary;  Laterality: Bilateral;    Home Medications:  Allergies as of 09/22/2022       Reactions   Atorvastatin Cough        Medication List        Accurate as of September 21, 2022 11:19 AM. If you have any questions, ask your nurse or doctor.          acetaminophen 500 MG tablet Commonly known as: TYLENOL Take 500 mg by mouth daily as needed for moderate pain, fever or headache.   albuterol (2.5 MG/3ML) 0.083% nebulizer solution Commonly known as: PROVENTIL Take 3 mLs (2.5 mg total) by nebulization every 6 (six) hours as needed for wheezing or shortness of breath.   aspirin EC 81 MG tablet Take 81 mg by mouth daily.   benzonatate 100 MG capsule Commonly known as: Tessalon Perles Take 1 capsule (100 mg total) by mouth 2 (two) times daily as needed for cough.   Breztri Aerosphere 160-9-4.8 MCG/ACT Aero Generic drug: Budeson-Glycopyrrol-Formoterol Inhale 2 puffs into the lungs in the morning and at bedtime.   metoprolol tartrate 25 MG tablet Commonly known as: LOPRESSOR TAKE 1 TABLET BY MOUTH TWICE A DAY   nitroGLYCERIN 0.4 MG SL tablet Commonly known as: NITROSTAT Place 0.4 mg under the tongue every 5 (five) minutes as needed for chest pain.   rosuvastatin 20 MG tablet Commonly known as: CRESTOR Take 1 tablet (20 mg total) by mouth daily.        Allergies:  Allergies  Allergen Reactions   Atorvastatin Cough    Family History  Problem Relation Age of Onset   Alzheimer's disease Mother 21   Congestive Heart Failure Father 45   Heart disease Brother 40   Heart failure Brother 78   Heart failure Brother 65    Social History:  reports that he quit smoking about 27 years ago. His smoking  use included cigarettes. He has a 30.00 pack-year smoking history. He has never used smokeless tobacco. He reports that he does not drink alcohol and does not use drugs.  ROS: A complete review of systems was performed.  All systems are negative except for pertinent findings as noted.  Physical Exam:  Vital signs in last 24 hours: There were no vitals taken for this visit. Constitutional:  Alert and oriented, No acute distress Cardiovascular: Regular rate  Respiratory: Normal respiratory effort Neurologic: Grossly intact, no focal deficits Psychiatric: Normal mood and affect  I have reviewed prior pt notes  I have reviewed notes from Dr. Arbutus Ped  I have reviewed urinalysis results  I have independently reviewed prior imaging--PET scan again reviewed.  Stone diameter 12 mm.   Impression/Assessment:  1.  History of low-grade nonmuscle invasive bladder cancer.  Scope today revealed no evidence of recurrence but I did see the bladder calculus  2.  12  mm bladder calculus, could well be on capsular tab from UroLift  Plan:  I would suggest eventual cystolitholapaxy.  We will work on getting this scheduled sometime in August  Voided urine sent for cytology

## 2022-09-22 ENCOUNTER — Encounter: Payer: Self-pay | Admitting: Urology

## 2022-09-22 ENCOUNTER — Ambulatory Visit (INDEPENDENT_AMBULATORY_CARE_PROVIDER_SITE_OTHER): Payer: Medicare HMO | Admitting: Urology

## 2022-09-22 VITALS — BP 123/61 | HR 83

## 2022-09-22 DIAGNOSIS — Z8551 Personal history of malignant neoplasm of bladder: Secondary | ICD-10-CM | POA: Diagnosis not present

## 2022-09-22 DIAGNOSIS — N210 Calculus in bladder: Secondary | ICD-10-CM

## 2022-09-22 LAB — MICROSCOPIC EXAMINATION: Bacteria, UA: NONE SEEN

## 2022-09-22 LAB — URINALYSIS, ROUTINE W REFLEX MICROSCOPIC
Bilirubin, UA: NEGATIVE
Glucose, UA: NEGATIVE
Ketones, UA: NEGATIVE
Leukocytes,UA: NEGATIVE
Nitrite, UA: NEGATIVE
Specific Gravity, UA: 1.025 (ref 1.005–1.030)
Urobilinogen, Ur: 1 mg/dL (ref 0.2–1.0)
pH, UA: 6 (ref 5.0–7.5)

## 2022-09-22 MED ORDER — CIPROFLOXACIN HCL 500 MG PO TABS
500.0000 mg | ORAL_TABLET | Freq: Once | ORAL | Status: AC
Start: 2022-09-22 — End: 2022-09-22
  Administered 2022-09-22: 500 mg via ORAL

## 2022-09-23 LAB — CYTOLOGY, URINE

## 2022-09-25 DIAGNOSIS — Z79899 Other long term (current) drug therapy: Secondary | ICD-10-CM | POA: Diagnosis not present

## 2022-09-25 DIAGNOSIS — Z299 Encounter for prophylactic measures, unspecified: Secondary | ICD-10-CM | POA: Diagnosis not present

## 2022-09-25 DIAGNOSIS — R5383 Other fatigue: Secondary | ICD-10-CM | POA: Diagnosis not present

## 2022-09-25 DIAGNOSIS — E7800 Pure hypercholesterolemia, unspecified: Secondary | ICD-10-CM | POA: Diagnosis not present

## 2022-09-25 DIAGNOSIS — I10 Essential (primary) hypertension: Secondary | ICD-10-CM | POA: Diagnosis not present

## 2022-09-25 DIAGNOSIS — Z87891 Personal history of nicotine dependence: Secondary | ICD-10-CM | POA: Diagnosis not present

## 2022-09-25 DIAGNOSIS — Z125 Encounter for screening for malignant neoplasm of prostate: Secondary | ICD-10-CM | POA: Diagnosis not present

## 2022-09-25 DIAGNOSIS — Z0000 Encounter for general adult medical examination without abnormal findings: Secondary | ICD-10-CM | POA: Diagnosis not present

## 2022-09-28 ENCOUNTER — Inpatient Hospital Stay: Payer: Medicare HMO

## 2022-09-28 ENCOUNTER — Other Ambulatory Visit: Payer: Self-pay

## 2022-09-28 ENCOUNTER — Inpatient Hospital Stay: Payer: Medicare HMO | Attending: Internal Medicine

## 2022-09-28 ENCOUNTER — Inpatient Hospital Stay (HOSPITAL_BASED_OUTPATIENT_CLINIC_OR_DEPARTMENT_OTHER): Payer: Medicare HMO | Admitting: Internal Medicine

## 2022-09-28 VITALS — BP 147/94 | HR 73 | Resp 16

## 2022-09-28 DIAGNOSIS — Z9221 Personal history of antineoplastic chemotherapy: Secondary | ICD-10-CM | POA: Insufficient documentation

## 2022-09-28 DIAGNOSIS — C3411 Malignant neoplasm of upper lobe, right bronchus or lung: Secondary | ICD-10-CM | POA: Diagnosis not present

## 2022-09-28 DIAGNOSIS — Z902 Acquired absence of lung [part of]: Secondary | ICD-10-CM | POA: Insufficient documentation

## 2022-09-28 DIAGNOSIS — Z5112 Encounter for antineoplastic immunotherapy: Secondary | ICD-10-CM | POA: Diagnosis not present

## 2022-09-28 DIAGNOSIS — C3491 Malignant neoplasm of unspecified part of right bronchus or lung: Secondary | ICD-10-CM | POA: Diagnosis not present

## 2022-09-28 DIAGNOSIS — Z923 Personal history of irradiation: Secondary | ICD-10-CM | POA: Diagnosis not present

## 2022-09-28 DIAGNOSIS — Z79899 Other long term (current) drug therapy: Secondary | ICD-10-CM | POA: Insufficient documentation

## 2022-09-28 DIAGNOSIS — Z85828 Personal history of other malignant neoplasm of skin: Secondary | ICD-10-CM | POA: Insufficient documentation

## 2022-09-28 LAB — CBC WITH DIFFERENTIAL (CANCER CENTER ONLY)
Abs Immature Granulocytes: 0.01 10*3/uL (ref 0.00–0.07)
Basophils Absolute: 0 10*3/uL (ref 0.0–0.1)
Basophils Relative: 1 %
Eosinophils Absolute: 0.2 10*3/uL (ref 0.0–0.5)
Eosinophils Relative: 5 %
HCT: 38.5 % — ABNORMAL LOW (ref 39.0–52.0)
Hemoglobin: 12.8 g/dL — ABNORMAL LOW (ref 13.0–17.0)
Immature Granulocytes: 0 %
Lymphocytes Relative: 24 %
Lymphs Abs: 1.2 10*3/uL (ref 0.7–4.0)
MCH: 30.6 pg (ref 26.0–34.0)
MCHC: 33.2 g/dL (ref 30.0–36.0)
MCV: 92.1 fL (ref 80.0–100.0)
Monocytes Absolute: 0.7 10*3/uL (ref 0.1–1.0)
Monocytes Relative: 13 %
Neutro Abs: 2.9 10*3/uL (ref 1.7–7.7)
Neutrophils Relative %: 57 %
Platelet Count: 159 10*3/uL (ref 150–400)
RBC: 4.18 MIL/uL — ABNORMAL LOW (ref 4.22–5.81)
RDW: 13.3 % (ref 11.5–15.5)
WBC Count: 5.1 10*3/uL (ref 4.0–10.5)
nRBC: 0 % (ref 0.0–0.2)

## 2022-09-28 LAB — CMP (CANCER CENTER ONLY)
ALT: 8 U/L (ref 0–44)
AST: 12 U/L — ABNORMAL LOW (ref 15–41)
Albumin: 3.8 g/dL (ref 3.5–5.0)
Alkaline Phosphatase: 73 U/L (ref 38–126)
Anion gap: 6 (ref 5–15)
BUN: 15 mg/dL (ref 8–23)
CO2: 27 mmol/L (ref 22–32)
Calcium: 9.3 mg/dL (ref 8.9–10.3)
Chloride: 104 mmol/L (ref 98–111)
Creatinine: 1.24 mg/dL (ref 0.61–1.24)
GFR, Estimated: 59 mL/min — ABNORMAL LOW (ref >60)
Glucose, Bld: 98 mg/dL (ref 70–99)
Potassium: 4.1 mmol/L (ref 3.5–5.1)
Sodium: 137 mmol/L (ref 135–145)
Total Bilirubin: 1 mg/dL (ref 0.3–1.2)
Total Protein: 6.5 g/dL (ref 6.5–8.1)

## 2022-09-28 LAB — TSH: TSH: 3.969 u[IU]/mL (ref 0.350–4.500)

## 2022-09-28 MED ORDER — SODIUM CHLORIDE 0.9 % IV SOLN: Freq: Once | INTRAVENOUS | Status: AC

## 2022-09-28 MED ORDER — SODIUM CHLORIDE 0.9 % IV SOLN
1500.0000 mg | Freq: Once | INTRAVENOUS | Status: AC
  Administered 2022-09-28: 1500 mg via INTRAVENOUS
  Filled 2022-09-28: qty 30

## 2022-09-28 NOTE — Patient Instructions (Signed)
[facility]  Discharge Instructions: Thank you for choosing [facility] to provide your oncology and hematology care.   If you have a lab appointment with the Cancer Center, please go directly to the Cancer Center and check in at the registration area.   Wear comfortable clothing and clothing appropriate for easy access to any Portacath or PICC line.   We strive to give you quality time with your provider. You may need to reschedule your appointment if you arrive late (15 or more minutes).  Arriving late affects you and other patients whose appointments are after yours.  Also, if you miss three or more appointments without notifying the office, you may be dismissed from the clinic at the provider's discretion.      For prescription refill requests, have your pharmacy contact our office and allow 72 hours for refills to be completed.    Today you received the following chemotherapy and/or immunotherapy agents: Imfinzi      To help prevent nausea and vomiting after your treatment, we encourage you to take your nausea medication as directed.  BELOW ARE SYMPTOMS THAT SHOULD BE REPORTED IMMEDIATELY: *FEVER GREATER THAN 100.4 F (38 C) OR HIGHER *CHILLS OR SWEATING *NAUSEA AND VOMITING THAT IS NOT CONTROLLED WITH YOUR NAUSEA MEDICATION *UNUSUAL SHORTNESS OF BREATH *UNUSUAL BRUISING OR BLEEDING *URINARY PROBLEMS (pain or burning when urinating, or frequent urination) *BOWEL PROBLEMS (unusual diarrhea, constipation, pain near the anus) TENDERNESS IN MOUTH AND THROAT WITH OR WITHOUT PRESENCE OF ULCERS (sore throat, sores in mouth, or a toothache) UNUSUAL RASH, SWELLING OR PAIN  UNUSUAL VAGINAL DISCHARGE OR ITCHING   Items with * indicate a potential emergency and should be followed up as soon as possible or go to the Emergency Department if any problems should occur.  Please show the CHEMOTHERAPY ALERT CARD or IMMUNOTHERAPY ALERT CARD at  check-in to the Emergency Department and triage nurse.  Should you have questions after your visit or need to cancel or reschedule your appointment, please contact [facility]  Dept: [phone]  and follow the prompts.  Office hours are 8:00 a.m. to 4:30 p.m. Monday - Friday. Please note that voicemails left after 4:00 p.m. may not be returned until the following business day.  We are closed weekends and major holidays. You have access to a nurse at all times for urgent questions. Please call the main number to the clinic Dept: [phone] and follow the prompts.   For any non-urgent questions, you may also contact your provider using MyChart. We now offer e-Visits for anyone 18 and older to request care online for non-urgent symptoms. For details visit [email or web address].   Also download the MyChart app! Go to the app store, search "MyChart", open the app, select [place], and log in with your MyChart username and password.   

## 2022-09-28 NOTE — Progress Notes (Signed)
[facility] Telephone:([phone]) [phone]   Fax:([phone]) [phone]  OFFICE PROGRESS NOTE  [name], MD [address] [place] [address]  DIAGNOSIS: Stage IIIC (T3, N3, M0) non-small cell lung cancer, adenocarcinoma presented with right upper lobe lung mass status post right upper lobe wedge resection but no lymph node dissection.  The tumor measured 3.3 cm with visceral pleural involvement and very close resection margin less than 0.1 cm from the tumor.  This was performed on May 13, 2021 in the same setting of CABG under the care of Dr. Cliffton Asters.  PET scan performed in March 2023 showed a right upper lobe nodule adjacent to the wedge resection site small hypermetabolic right hilar, right subcarinal, and left infrahilar lymph nodes which could be reactive but are suspicious for nodal involvement.   Molecular studies showed no actionable mutations and PD-L1 expression was 0%   PRIOR THERAPY:  1) Right upper lobe wedge resection under the care of Dr. Cliffton Asters on 05/13/2021. 2) Concurrent chemoradiation with weekly carboplatin for AUC of 2 and paclitaxel 45 Mg/M2.  First dose Aug 11, 2021.  Status post 6 cycles.  Last dose was given September 22, 2021   CURRENT THERAPY: Consolidation treatment with immunotherapy with Imfinzi 1500 Mg IV every 4 weeks.  First dose October 27, 2021.  Status post 11 cycles.  INTERVAL HISTORY: Kevin [name] 82 y.o. male returns to the clinic today for follow-up visit accompanied by his wife.  The patient is feeling fine today with no concerning complaints.  He denied having any chest pain, shortness of breath, cough or hemoptysis.  He has no nausea, vomiting, diarrhea or constipation.  He has no headache or visual changes.  He has some insomnia and he is taking over-the-counter medication.  The patient has no recent weight loss or night sweats.  He is here today for evaluation before starting cycle #12 of his treatment.  MEDICAL HISTORY: Past Medical  History:  Diagnosis Date   Arthritis    Coronary artery disease    Hyperlipidemia    SCC (squamous cell carcinoma) 05/28/2004   top of left hand curet x3 70fu   SCC (squamous cell carcinoma) 09/13/2013   mid back tx cx3 66fu   SCC (squamous cell carcinoma) 09/06/2018   left wrist tx with bx   Squamous cell carcinoma of skin 11/16/2003   left ear tip tx curet and cautery 80fu    ALLERGIES:  is allergic to atorvastatin.  MEDICATIONS:  Current Outpatient Medications  Medication Sig Dispense Refill   acetaminophen (TYLENOL) 500 MG tablet Take 500 mg by mouth daily as needed for moderate pain, fever or headache.     albuterol (PROVENTIL) (2.5 MG/3ML) 0.083% nebulizer solution Take 3 mLs (2.5 mg total) by nebulization every 6 (six) hours as needed for wheezing or shortness of breath. 360 mL 11   aspirin EC 81 MG tablet Take 81 mg by mouth daily.     benzonatate (TESSALON PERLES) 100 MG capsule Take 1 capsule (100 mg total) by mouth 2 (two) times daily as needed for cough. 60 capsule 0   Budeson-Glycopyrrol-Formoterol (BREZTRI AEROSPHERE) 160-9-4.8 MCG/ACT AERO Inhale 2 puffs into the lungs in the morning and at bedtime. 1 each 5   metoprolol tartrate (LOPRESSOR) 25 MG tablet TAKE 1 TABLET BY MOUTH TWICE A DAY 180 tablet 3   nitroGLYCERIN (NITROSTAT) 0.4 MG SL tablet Place 0.4 mg under the tongue every 5 (five) minutes as needed for chest pain.  rosuvastatin (CRESTOR) 20 MG tablet Take 1 tablet (20 mg total) by mouth daily. 90 tablet 3   No current facility-administered medications for this visit.    SURGICAL HISTORY:  Past Surgical History:  Procedure Laterality Date   BRONCHIAL BIOPSY  07/22/2021   Procedure: BRONCHIAL BIOPSIES;  Surgeon: Leslye Peer, MD;  Location: [facility] ENDOSCOPY;  Service: Cardiopulmonary;;   BRONCHIAL BRUSHINGS  07/22/2021   Procedure: BRONCHIAL BRUSHINGS;  Surgeon: Leslye Peer, MD;  Location: [facility] ENDOSCOPY;  Service: Cardiopulmonary;;   BRONCHIAL NEEDLE  ASPIRATION BIOPSY  07/22/2021   Procedure: BRONCHIAL NEEDLE ASPIRATION BIOPSIES;  Surgeon: Leslye Peer, MD;  Location: [facility] ENDOSCOPY;  Service: Cardiopulmonary;;   CHOLECYSTECTOMY N/A 02/15/2022   Procedure: LAPAROSCOPIC CHOLECYSTECTOMY;  Surgeon: Lucretia Roers, MD;  Location: [facility] ORS;  Service: General;  Laterality: N/A;   CORONARY ARTERY BYPASS GRAFT N/A 05/13/2021   Procedure: CORONARY ARTERY BYPASS GRAFTING (CABG) TIMES 3  , ON PUMP, USING LEFT INTERNAL MAMMARY ARTERY AND RIGHT GREATER SAPHENOUS VEIN HARVESTED ENDOSCOPICALLY;  Surgeon: Corliss Skains, MD;  Location: [facility] OR;  Service: Open Heart Surgery;  Laterality: N/A;   ENDOVEIN HARVEST OF GREATER SAPHENOUS VEIN Right 05/13/2021   Procedure: ENDOVEIN HARVEST OF GREATER SAPHENOUS VEIN;  Surgeon: Corliss Skains, MD;  Location: [facility] OR;  Service: Open Heart Surgery;  Laterality: Right;   epidural     back pain   IR CT SPINE LTD  06/18/2020   IR KYPHO LUMBAR INC FX REDUCE BONE BX UNI/BIL CANNULATION INC/IMAGING  06/18/2020   LEFT HEART CATH AND CORONARY ANGIOGRAPHY N/A 05/06/2021   Procedure: LEFT HEART CATH AND CORONARY ANGIOGRAPHY;  Surgeon: Yates Decamp, MD;  Location: [facility] INVASIVE CV LAB;  Service: Cardiovascular;  Laterality: N/A;   LUNG BIOPSY Right 05/13/2021   Procedure: RIGHT LUNG BIOPSY;  Surgeon: Corliss Skains, MD;  Location: [facility] OR;  Service: Open Heart Surgery;  Laterality: Right;   MINOR HEMORRHOIDECTOMY     SHOULDER SURGERY     TEE WITHOUT CARDIOVERSION N/A 05/13/2021   Procedure: TRANSESOPHAGEAL ECHOCARDIOGRAM (TEE);  Surgeon: Corliss Skains, MD;  Location: [facility] OR;  Service: Open Heart Surgery;  Laterality: N/A;   TRANSURETHRAL RESECTION OF BLADDER TUMOR  2014   TRANSURETHRAL RESECTION OF BLADDER TUMOR N/A 05/19/2018   Procedure: TRANSURETHRAL RESECTION OF BLADDER TUMOR (TURBT) AND (INTRAVESICAL GEMCITABINE INSTILLED IN BLADDER IN PACU) ;  Surgeon: Marcine Matar, MD;  Location: [facility] ORS;  Service: Urology;   Laterality: N/A;  30 MINS   VIDEO BRONCHOSCOPY WITH ENDOBRONCHIAL ULTRASOUND Bilateral 07/22/2021   Procedure: VIDEO BRONCHOSCOPY WITH ENDOBRONCHIAL ULTRASOUND;  Surgeon: Leslye Peer, MD;  Location: [facility] ENDOSCOPY;  Service: Cardiopulmonary;  Laterality: Bilateral;    REVIEW OF SYSTEMS:  A comprehensive review of systems was negative except for: Behavioral/Psych: positive for sleep disturbance   PHYSICAL EXAMINATION: General appearance: alert, cooperative, and no distress Head: Normocephalic, without obvious abnormality, atraumatic Neck: no adenopathy, no JVD, supple, symmetrical, trachea midline, and thyroid not enlarged, symmetric, no tenderness/mass/nodules Lymph nodes: Cervical, supraclavicular, and axillary nodes normal. Resp: clear to auscultation bilaterally Back: symmetric, no curvature. ROM normal. No CVA tenderness. Cardio: regular rate and rhythm, S1, S2 normal, no murmur, click, rub or gallop GI: soft, non-tender; bowel sounds normal; no masses,  no organomegaly Extremities: extremities normal, atraumatic, no cyanosis or edema  ECOG PERFORMANCE STATUS: 0 - Asymptomatic  Blood pressure (!) 130/59, pulse 72, temperature 97.8 F (36.6 C), temperature source Oral, resp. rate 17, height 5\' 11"  (1.803 m), weight  165 lb 8 oz (75.1 kg), SpO2 99 %.  LABORATORY DATA: Lab Results  Component Value Date   WBC 5.1 09/28/2022   HGB 12.8 (L) 09/28/2022   HCT 38.5 (L) 09/28/2022   MCV 92.1 09/28/2022   PLT 159 09/28/2022      Chemistry      Component Value Date/Time   NA 137 09/28/2022 0843   NA 139 05/01/2021 1146   K 4.1 09/28/2022 0843   CL 104 09/28/2022 0843   CO2 27 09/28/2022 0843   BUN 15 09/28/2022 0843   BUN 20 05/01/2021 1146   CREATININE 1.24 09/28/2022 0843      Component Value Date/Time   CALCIUM 9.3 09/28/2022 0843   ALKPHOS 73 09/28/2022 0843   AST 12 (L) 09/28/2022 0843   ALT 8 09/28/2022 0843   BILITOT 1.0 09/28/2022 0843       RADIOGRAPHIC  STUDIES: No results found.  ASSESSMENT AND PLAN: This is a very pleasant 82 years old white male diagnosed with stage IIIC (T3, N3, M0) non-small cell lung cancer, adenocarcinoma with no actionable mutations and PD-L1 expression of 0% diagnosed in January 2023 status post wedge resection of the right upper lobe lung mass in a setting of CABG under the care of Dr. Cliffton Asters but the PET scan showed residual disease in the right upper lobe in addition to hypermetabolic right hilar, subcarinal and left infrahilar lymph nodes. The patient was seen by Dr. Delton Coombes and biopsy of the right upper lobe as well as the subcarinal and left hilar lymph nodes were positive for malignancy consistent with non-small cell lung cancer, adenocarcinoma. He underwent a course of concurrent chemoradiation with weekly carboplatin for AUC of 2 and paclitaxel 45 Mg/M2 status post 6 cycles.  The patient tolerated this previous course of concurrent chemoradiation fairly well except for fatigue and mild odynophagia. The patient is currently undergoing consolidation treatment with immunotherapy with Imfinzi 1500 Mg IV every 4 weeks status post 11 cycles.    The patient has been tolerating this treatment well with no concerning adverse effects. I recommended for him to proceed with cycle #12 today as planned. He will come back for follow-up visit in 4 weeks for evaluation before the last cycle of his treatment. He was advised to call immediately if he has any other concerning symptoms in the interval. The patient voices understanding of current disease status and treatment options and is in agreement with the current care plan. All questions were answered. The patient knows to call the clinic with any problems, questions or concerns. We can certainly see the patient much sooner if necessary. The total time spent in the appointment was 20 minutes.   Disclaimer: This note was dictated with voice recognition software. Similar sounding  words can inadvertently be transcribed and may not be corrected upon review.

## 2022-09-29 ENCOUNTER — Telehealth: Payer: Self-pay

## 2022-09-29 ENCOUNTER — Other Ambulatory Visit: Payer: Medicare HMO | Admitting: Urology

## 2022-09-29 NOTE — Telephone Encounter (Signed)
Patient made aware of Dr. Retta Diones recommendation "Notify patient that cytology was negative". Patient voiced understanding.

## 2022-09-30 LAB — T4: T4, Total: 5.4 ug/dL (ref 4.5–12.0)

## 2022-10-09 DIAGNOSIS — R070 Pain in throat: Secondary | ICD-10-CM | POA: Diagnosis not present

## 2022-10-09 DIAGNOSIS — J209 Acute bronchitis, unspecified: Secondary | ICD-10-CM | POA: Diagnosis not present

## 2022-10-12 ENCOUNTER — Ambulatory Visit (HOSPITAL_COMMUNITY)
Admission: RE | Admit: 2022-10-12 | Discharge: 2022-10-12 | Disposition: A | Discharge status: Home / Self Care | Payer: Medicare HMO | Source: Ambulatory Visit | Attending: Internal Medicine | Admitting: Internal Medicine

## 2022-10-12 ENCOUNTER — Other Ambulatory Visit (HOSPITAL_COMMUNITY): Payer: Self-pay | Admitting: Internal Medicine

## 2022-10-12 DIAGNOSIS — Z299 Encounter for prophylactic measures, unspecified: Secondary | ICD-10-CM | POA: Diagnosis not present

## 2022-10-12 DIAGNOSIS — M79605 Pain in left leg: Secondary | ICD-10-CM | POA: Insufficient documentation

## 2022-10-12 DIAGNOSIS — I10 Essential (primary) hypertension: Secondary | ICD-10-CM | POA: Diagnosis not present

## 2022-10-12 DIAGNOSIS — L570 Actinic keratosis: Secondary | ICD-10-CM | POA: Diagnosis not present

## 2022-10-16 DIAGNOSIS — R5383 Other fatigue: Secondary | ICD-10-CM | POA: Diagnosis not present

## 2022-10-21 NOTE — Progress Notes (Signed)
[facility] OFFICE PROGRESS NOTE  [name], MD [address] [place] [address]  DIAGNOSIS: Stage IIIC (T3, N3, M0) non-small cell lung cancer, adenocarcinoma presented with right upper lobe lung mass status post right upper lobe wedge resection but no lymph node dissection.  The tumor measured 3.3 cm with visceral pleural involvement and very close resection margin less than 0.1 cm from the tumor.  This was performed on May 13, 2021 in the same setting of CABG under the care of Dr. Cliffton Asters.  PET scan performed in March 2023 showed a right upper lobe nodule adjacent to the wedge resection site small hypermetabolic right hilar, right subcarinal, and left infrahilar lymph nodes which could be reactive but are suspicious for nodal involvement.   Molecular studies showed no actionable mutations and PD-L1 expression was 0%  PRIOR THERAPY: 1) Right upper lobe wedge resection under the care of Dr. Cliffton Asters on 05/13/2021. 2) Concurrent chemoradiation with weekly carboplatin for AUC of 2 and paclitaxel 45 Mg/M2.  First dose Aug 11, 2021.  Status post 6 cycles.  Last dose was given September 22, 2021  CURRENT THERAPY: Consolidation treatment with immunotherapy with Imfinzi 1500 Mg IV every 4 weeks.  First dose October 27, 2021.  Status post 12 cycles.   INTERVAL HISTORY: Kevin [name] 82 y.o. male returns to the clinic for a follow up visit accompanied his husband. The patient is feeling well today without any concerning complaints. The patient continues to tolerate treatment with consolidation immunotherapy well without any adverse effects. Denies any fever, chills, night sweats, or weight loss. Denies any chest pain, cough, or hemoptysis. He may have mild dyspnea on exertion if her overexerts but he does not do that frequently. Denies any nausea, vomiting, diarrhea, or constipation. Denies any headache or visual changes. Denies any rashes or skin changes. The patient is here today for  evaluation prior to starting his last cycle of treatment with cycle # 13    MEDICAL HISTORY: Past Medical History:  Diagnosis Date   Arthritis    Coronary artery disease    Hyperlipidemia    SCC (squamous cell carcinoma) 05/28/2004   top of left hand curet x3 80fu   SCC (squamous cell carcinoma) 09/13/2013   mid back tx cx3 46fu   SCC (squamous cell carcinoma) 09/06/2018   left wrist tx with bx   Squamous cell carcinoma of skin 11/16/2003   left ear tip tx curet and cautery 90fu    ALLERGIES:  is allergic to atorvastatin.  MEDICATIONS:  Current Outpatient Medications  Medication Sig Dispense Refill   acetaminophen (TYLENOL) 500 MG tablet Take 500 mg by mouth daily as needed for moderate pain, fever or headache.     aspirin EC 81 MG tablet Take 81 mg by mouth daily.     metoprolol tartrate (LOPRESSOR) 25 MG tablet TAKE 1 TABLET BY MOUTH TWICE A DAY 180 tablet 3   nitroGLYCERIN (NITROSTAT) 0.4 MG SL tablet Place 0.4 mg under the tongue every 5 (five) minutes as needed for chest pain.     rosuvastatin (CRESTOR) 20 MG tablet Take 1 tablet (20 mg total) by mouth daily. 90 tablet 3   albuterol (PROVENTIL) (2.5 MG/3ML) 0.083% nebulizer solution Take 3 mLs (2.5 mg total) by nebulization every 6 (six) hours as needed for wheezing or shortness of breath. (Patient not taking: Reported on 10/26/2022) 360 mL 11   benzonatate (TESSALON PERLES) 100 MG capsule Take 1 capsule (100 mg total) by mouth 2 (  two) times daily as needed for cough. (Patient not taking: Reported on 10/26/2022) 60 capsule 0   Budeson-Glycopyrrol-Formoterol (BREZTRI AEROSPHERE) 160-9-4.8 MCG/ACT AERO Inhale 2 puffs into the lungs in the morning and at bedtime. (Patient not taking: Reported on 10/26/2022) 1 each 5   No current facility-administered medications for this visit.    SURGICAL HISTORY:  Past Surgical History:  Procedure Laterality Date   BRONCHIAL BIOPSY  07/22/2021   Procedure: BRONCHIAL BIOPSIES;  Surgeon: Leslye Peer, MD;  Location: [facility] ENDOSCOPY;  Service: Cardiopulmonary;;   BRONCHIAL BRUSHINGS  07/22/2021   Procedure: BRONCHIAL BRUSHINGS;  Surgeon: Leslye Peer, MD;  Location: [facility] ENDOSCOPY;  Service: Cardiopulmonary;;   BRONCHIAL NEEDLE ASPIRATION BIOPSY  07/22/2021   Procedure: BRONCHIAL NEEDLE ASPIRATION BIOPSIES;  Surgeon: Leslye Peer, MD;  Location: [facility] ENDOSCOPY;  Service: Cardiopulmonary;;   CHOLECYSTECTOMY N/A 02/15/2022   Procedure: LAPAROSCOPIC CHOLECYSTECTOMY;  Surgeon: Lucretia Roers, MD;  Location: [facility] ORS;  Service: General;  Laterality: N/A;   CORONARY ARTERY BYPASS GRAFT N/A 05/13/2021   Procedure: CORONARY ARTERY BYPASS GRAFTING (CABG) TIMES 3  , ON PUMP, USING LEFT INTERNAL MAMMARY ARTERY AND RIGHT GREATER SAPHENOUS VEIN HARVESTED ENDOSCOPICALLY;  Surgeon: Corliss Skains, MD;  Location: [facility] OR;  Service: Open Heart Surgery;  Laterality: N/A;   ENDOVEIN HARVEST OF GREATER SAPHENOUS VEIN Right 05/13/2021   Procedure: ENDOVEIN HARVEST OF GREATER SAPHENOUS VEIN;  Surgeon: Corliss Skains, MD;  Location: [facility] OR;  Service: Open Heart Surgery;  Laterality: Right;   epidural     back pain   IR CT SPINE LTD  06/18/2020   IR KYPHO LUMBAR INC FX REDUCE BONE BX UNI/BIL CANNULATION INC/IMAGING  06/18/2020   LEFT HEART CATH AND CORONARY ANGIOGRAPHY N/A 05/06/2021   Procedure: LEFT HEART CATH AND CORONARY ANGIOGRAPHY;  Surgeon: Yates Decamp, MD;  Location: [facility] INVASIVE CV LAB;  Service: Cardiovascular;  Laterality: N/A;   LUNG BIOPSY Right 05/13/2021   Procedure: RIGHT LUNG BIOPSY;  Surgeon: Corliss Skains, MD;  Location: [facility] OR;  Service: Open Heart Surgery;  Laterality: Right;   MINOR HEMORRHOIDECTOMY     SHOULDER SURGERY     TEE WITHOUT CARDIOVERSION N/A 05/13/2021   Procedure: TRANSESOPHAGEAL ECHOCARDIOGRAM (TEE);  Surgeon: Corliss Skains, MD;  Location[facility] OR;  Service: Open Heart Surgery;  Laterality: N/A;   TRANSURETHRAL RESECTION OF BLADDER TUMOR  2014   TRANSURETHRAL  RESECTION OF BLADDER TUMOR N/A 05/19/2018   Procedure: TRANSURETHRAL RESECTION OF BLADDER TUMOR (TURBT) AND (INTRAVESICAL GEMCITABINE INSTILLED IN BLADDER IN PACU) ;  Surgeon: Marcine Matar, MD;  Location: [facility] ORS;  Service: Urology;  Laterality: N/A;  30 MINS   VIDEO BRONCHOSCOPY WITH ENDOBRONCHIAL ULTRASOUND Bilateral 07/22/2021   Procedure: VIDEO BRONCHOSCOPY WITH ENDOBRONCHIAL ULTRASOUND;  Surgeon: Leslye Peer, MD;  Location: [facility] ENDOSCOPY;  Service: Cardiopulmonary;  Laterality: Bilateral;    REVIEW OF SYSTEMS:   Review of Systems  Constitutional: Negative for appetite change, chills, fatigue, fever and unexpected weight change.  HENT: Negative for mouth sores, nosebleeds, sore throat and trouble swallowing.   Eyes: Negative for eye problems and icterus.  Respiratory: Negative for cough, hemoptysis, shortness of breath and wheezing.   Cardiovascular: Negative for chest pain and leg swelling.  Gastrointestinal: Negative for abdominal pain, constipation, diarrhea, nausea and vomiting.  Genitourinary: Negative for bladder incontinence, difficulty urinating, dysuria, frequency and hematuria.   Musculoskeletal: Negative for back pain, gait problem, neck pain and neck stiffness.  Skin: Negative for itching and rash.  Neurological:  Negative for dizziness, extremity weakness, gait problem, headaches, light-headedness and seizures.  Hematological: Negative for adenopathy. Does not bruise/bleed easily.  Psychiatric/Behavioral: Negative for confusion, depression and sleep disturbance. The patient is not nervous/anxious.     PHYSICAL EXAMINATION:  Blood pressure 116/66, pulse 69, temperature (!) 97.3 F (36.3 C), temperature source Oral, resp. rate 13, weight 166 lb 8 oz (75.5 kg), SpO2 97%.  ECOG PERFORMANCE STATUS: 1  Physical Exam  Constitutional: Oriented to person, place, and time and well-developed, well-nourished, and in no distress.  HENT:  Head: Normocephalic and atraumatic.   Mouth/Throat: Oropharynx is clear and moist. No oropharyngeal exudate.  Eyes: Conjunctivae are normal. Right eye exhibits no discharge. Left eye exhibits no discharge. No scleral icterus.  Neck: Normal range of motion. Neck supple.  Cardiovascular: Normal rate, regular rhythm, normal heart sounds and intact distal pulses.   Pulmonary/Chest: Effort normal and breath sounds normal. No respiratory distress. No wheezes. No rales.  Abdominal: Soft. Bowel sounds are normal. Exhibits no distension and no mass. There is no tenderness.  Musculoskeletal: Normal range of motion. Exhibits no edema.  Lymphadenopathy:    No cervical adenopathy.  Neurological: Alert and oriented to person, place, and time. Exhibits normal muscle tone. Gait normal. Coordination normal.  Skin: Skin is warm and dry. No rash noted. Not diaphoretic. No erythema. No pallor.  Psychiatric: Mood, memory and judgment normal.  Vitals reviewed.  LABORATORY DATA: Lab Results  Component Value Date   WBC 5.4 10/26/2022   HGB 12.2 (L) 10/26/2022   HCT 36.0 (L) 10/26/2022   MCV 90.5 10/26/2022   PLT 185 10/26/2022      Chemistry      Component Value Date/Time   NA 139 10/26/2022 0902   NA 139 05/01/2021 1146   K 4.0 10/26/2022 0902   CL 105 10/26/2022 0902   CO2 28 10/26/2022 0902   BUN 17 10/26/2022 0902   BUN 20 05/01/2021 1146   CREATININE 1.27 (H) 10/26/2022 0902      Component Value Date/Time   CALCIUM 9.1 10/26/2022 0902   ALKPHOS 64 10/26/2022 0902   AST 13 (L) 10/26/2022 0902   ALT 10 10/26/2022 0902   BILITOT 0.9 10/26/2022 0902       RADIOGRAPHIC STUDIES:  US Venous Img Lower Unilateral Left (DVT)  Result Date: 10/12/2022 CLINICAL DATA:  Left leg pain for 3 days EXAM: LEFT LOWER EXTREMITY VENOUS DOPPLER ULTRASOUND TECHNIQUE: Gray-scale sonography with compression, as well as color and duplex ultrasound, were performed to evaluate the deep venous system(s) from the level of the common femoral vein through  the popliteal and proximal calf veins. COMPARISON:  None available FINDINGS: VENOUS Normal compressibility of the common femoral, superficial femoral, and popliteal veins, as well as the visualized calf veins. Visualized portions of profunda femoral vein and great saphenous vein unremarkable. No filling defects to suggest DVT on grayscale or color Doppler imaging. Doppler waveforms show normal direction of venous flow, normal respiratory plasticity and response to augmentation. Limited views of the contralateral common femoral vein are unremarkable. OTHER None. Limitations: Limited visualization of the calf veins. IMPRESSION: No DVT of the left lower extremity. Electronically Signed   By: Acquanetta Belling M.D.   On: 10/12/2022 13:22     ASSESSMENT/PLAN:  This is a very pleasant 82 year old Caucasian male diagnosed with stage IIIc (T3, N3, M0) non-small cell lung cancer, adenocarcinoma.  The patient is negative for any actionable mutations.  His PD-L1 expression is 0%.  The patient was diagnosed  in January 2023 after status post wedge resection of the right upper lobe lung mass in the setting of a CABG under the care of Dr. Cliffton Asters.  The patient's staging PET scan showed residual disease in the right upper lobe in addition to hypermetabolic right hilar, subcarinal, and left infrahilar lymph nodes.   Dr. Delton Coombes performed a repeat bronchoscopy and biopsy of the right upper lobe mass as well as the subcarinal and left hilar lymph nodes which were positive for malignancy consistent with non-small cell lung cancer, adenocarcinoma.   He underwent a course of concurrent chemoradiation with weekly carboplatin for AUC of 2 and paclitaxel 45 Mg/M2 status post 6 cycles. The patient tolerated this previous course of concurrent chemoradiation fairly well except for fatigue and mild odynophagia.   The patient is currently undergoing consolidation treatment with immunotherapy with Imfinzi 1500 Mg IV every 4 weeks status post  12 cycles.    Labs were reviewed. Recommend that he proceed with cycle #13 today as scheduled.   I will arrange for a restaging CT scan of the chest prior to his next treatment.   We will see him a few days later to review the results.   The patient was advised to call immediately if he has any concerning symptoms in the interval. The patient voices understanding of current disease status and treatment options and is in agreement with the current care plan. All questions were answered. The patient knows to call the clinic with any problems, questions or concerns. We can certainly see the patient much sooner if necessary          Orders Placed This Encounter  Procedures   CT Chest W Contrast    Standing Status:   Future    Standing Expiration Date:   10/26/2023    Order Specific Question:   If indicated for the ordered procedure, I authorize the administration of contrast media per Radiology protocol    Answer:   Yes    Order Specific Question:   Does the patient have a contrast media/X-ray dye allergy?    Answer:   No    Order Specific Question:   Preferred imaging location?    Answer:   [facility]     The total time spent in the appointment was 20-29 minutes  Amonie Wisser L Jameriah Trotti, PA-C 10/26/22

## 2022-10-26 ENCOUNTER — Ambulatory Visit: Payer: Medicare HMO

## 2022-10-26 ENCOUNTER — Inpatient Hospital Stay: Payer: Medicare HMO | Admitting: Physician Assistant

## 2022-10-26 ENCOUNTER — Other Ambulatory Visit: Payer: Medicare HMO

## 2022-10-26 ENCOUNTER — Other Ambulatory Visit: Payer: Self-pay

## 2022-10-26 ENCOUNTER — Inpatient Hospital Stay: Payer: Medicare HMO

## 2022-10-26 ENCOUNTER — Inpatient Hospital Stay: Payer: Medicare HMO | Attending: Internal Medicine

## 2022-10-26 ENCOUNTER — Ambulatory Visit: Payer: Medicare HMO | Admitting: Physician Assistant

## 2022-10-26 VITALS — BP 116/66 | HR 69 | Temp 97.3°F | Resp 13 | Wt 166.5 lb

## 2022-10-26 VITALS — BP 132/85 | HR 75 | Temp 97.8°F | Resp 18

## 2022-10-26 DIAGNOSIS — Z5112 Encounter for antineoplastic immunotherapy: Secondary | ICD-10-CM | POA: Diagnosis not present

## 2022-10-26 DIAGNOSIS — Z7982 Long term (current) use of aspirin: Secondary | ICD-10-CM | POA: Insufficient documentation

## 2022-10-26 DIAGNOSIS — C3411 Malignant neoplasm of upper lobe, right bronchus or lung: Secondary | ICD-10-CM | POA: Diagnosis not present

## 2022-10-26 DIAGNOSIS — C3491 Malignant neoplasm of unspecified part of right bronchus or lung: Secondary | ICD-10-CM

## 2022-10-26 DIAGNOSIS — Z79899 Other long term (current) drug therapy: Secondary | ICD-10-CM | POA: Diagnosis not present

## 2022-10-26 DIAGNOSIS — Z923 Personal history of irradiation: Secondary | ICD-10-CM | POA: Diagnosis not present

## 2022-10-26 LAB — CBC WITH DIFFERENTIAL (CANCER CENTER ONLY)
Abs Immature Granulocytes: 0.01 10*3/uL (ref 0.00–0.07)
Basophils Absolute: 0 10*3/uL (ref 0.0–0.1)
Basophils Relative: 1 %
Eosinophils Absolute: 0.2 10*3/uL (ref 0.0–0.5)
Eosinophils Relative: 3 %
HCT: 36 % — ABNORMAL LOW (ref 39.0–52.0)
Hemoglobin: 12.2 g/dL — ABNORMAL LOW (ref 13.0–17.0)
Immature Granulocytes: 0 %
Lymphocytes Relative: 20 %
Lymphs Abs: 1.1 10*3/uL (ref 0.7–4.0)
MCH: 30.7 pg (ref 26.0–34.0)
MCHC: 33.9 g/dL (ref 30.0–36.0)
MCV: 90.5 fL (ref 80.0–100.0)
Monocytes Absolute: 0.5 10*3/uL (ref 0.1–1.0)
Monocytes Relative: 10 %
Neutro Abs: 3.6 10*3/uL (ref 1.7–7.7)
Neutrophils Relative %: 66 %
Platelet Count: 185 10*3/uL (ref 150–400)
RBC: 3.98 MIL/uL — ABNORMAL LOW (ref 4.22–5.81)
RDW: 13.1 % (ref 11.5–15.5)
WBC Count: 5.4 10*3/uL (ref 4.0–10.5)
nRBC: 0 % (ref 0.0–0.2)

## 2022-10-26 LAB — CMP (CANCER CENTER ONLY)
ALT: 10 U/L (ref 0–44)
AST: 13 U/L — ABNORMAL LOW (ref 15–41)
Albumin: 3.7 g/dL (ref 3.5–5.0)
Alkaline Phosphatase: 64 U/L (ref 38–126)
Anion gap: 6 (ref 5–15)
BUN: 17 mg/dL (ref 8–23)
CO2: 28 mmol/L (ref 22–32)
Calcium: 9.1 mg/dL (ref 8.9–10.3)
Chloride: 105 mmol/L (ref 98–111)
Creatinine: 1.27 mg/dL — ABNORMAL HIGH (ref 0.61–1.24)
GFR, Estimated: 57 mL/min — ABNORMAL LOW (ref >60)
Glucose, Bld: 86 mg/dL (ref 70–99)
Potassium: 4 mmol/L (ref 3.5–5.1)
Sodium: 139 mmol/L (ref 135–145)
Total Bilirubin: 0.9 mg/dL (ref 0.3–1.2)
Total Protein: 6.7 g/dL (ref 6.5–8.1)

## 2022-10-26 LAB — TSH: TSH: 3.328 u[IU]/mL (ref 0.350–4.500)

## 2022-10-26 MED ORDER — SODIUM CHLORIDE 0.9 % IV SOLN: Freq: Once | INTRAVENOUS | Status: AC

## 2022-10-26 MED ORDER — SODIUM CHLORIDE 0.9 % IV SOLN
1500.0000 mg | Freq: Once | INTRAVENOUS | Status: AC
  Administered 2022-10-26: 1500 mg via INTRAVENOUS
  Filled 2022-10-26: qty 30

## 2022-10-26 NOTE — Patient Instructions (Signed)

## 2022-10-27 LAB — T4: T4, Total: 4.8 ug/dL (ref 4.5–12.0)

## 2022-11-03 ENCOUNTER — Other Ambulatory Visit: Payer: Medicare HMO | Admitting: Urology

## 2022-11-23 ENCOUNTER — Ambulatory Visit (HOSPITAL_COMMUNITY)
Admission: RE | Admit: 2022-11-23 | Discharge: 2022-11-23 | Disposition: A | Discharge status: Home / Self Care | Payer: Medicare HMO | Source: Ambulatory Visit | Attending: Physician Assistant | Admitting: Physician Assistant

## 2022-11-23 ENCOUNTER — Telehealth: Payer: Self-pay | Admitting: Internal Medicine

## 2022-11-23 DIAGNOSIS — C3491 Malignant neoplasm of unspecified part of right bronchus or lung: Secondary | ICD-10-CM | POA: Diagnosis not present

## 2022-11-23 DIAGNOSIS — J432 Centrilobular emphysema: Secondary | ICD-10-CM | POA: Diagnosis not present

## 2022-11-23 DIAGNOSIS — J90 Pleural effusion, not elsewhere classified: Secondary | ICD-10-CM | POA: Diagnosis not present

## 2022-11-23 DIAGNOSIS — C3490 Malignant neoplasm of unspecified part of unspecified bronchus or lung: Secondary | ICD-10-CM | POA: Diagnosis not present

## 2022-11-23 DIAGNOSIS — I700 Atherosclerosis of aorta: Secondary | ICD-10-CM | POA: Diagnosis not present

## 2022-11-23 MED ORDER — SODIUM CHLORIDE (PF) 0.9 % IJ SOLN
INTRAMUSCULAR | Status: AC
  Filled 2022-11-23: qty 50

## 2022-11-23 MED ORDER — IOHEXOL 300 MG/ML  SOLN
75.0000 mL | Freq: Once | INTRAMUSCULAR | Status: AC | PRN
  Administered 2022-11-23: 75 mL via INTRAVENOUS

## 2022-11-23 NOTE — Telephone Encounter (Signed)
 Called patient regarding August appointments, left a voicemail.

## 2022-11-25 DIAGNOSIS — N1831 Chronic kidney disease, stage 3a: Secondary | ICD-10-CM | POA: Diagnosis not present

## 2022-11-25 DIAGNOSIS — Z01818 Encounter for other preprocedural examination: Secondary | ICD-10-CM | POA: Diagnosis not present

## 2022-11-25 DIAGNOSIS — Z299 Encounter for prophylactic measures, unspecified: Secondary | ICD-10-CM | POA: Diagnosis not present

## 2022-11-25 DIAGNOSIS — I10 Essential (primary) hypertension: Secondary | ICD-10-CM | POA: Diagnosis not present

## 2022-11-25 DIAGNOSIS — J439 Emphysema, unspecified: Secondary | ICD-10-CM | POA: Diagnosis not present

## 2022-11-26 ENCOUNTER — Ambulatory Visit (INDEPENDENT_AMBULATORY_CARE_PROVIDER_SITE_OTHER): Payer: Medicare HMO | Admitting: Podiatry

## 2022-11-26 ENCOUNTER — Encounter: Payer: Self-pay | Admitting: Podiatry

## 2022-11-26 DIAGNOSIS — B351 Tinea unguium: Secondary | ICD-10-CM

## 2022-11-26 NOTE — Progress Notes (Signed)
Subjective:   Patient ID: Kevin [name], male   DOB: 82 y.o.   MRN: [id number]   HPI Patient states he has a thickened discolored nail bed left big toe 3 years duration does not remember specific injury to it states it is mildly loose nonpainful.  Does not smoke likes to be active   Review of Systems  All other systems reviewed and are negative.       Objective:  Physical Exam Vitals and nursing note reviewed.  Constitutional:      Appearance: He is well-developed.  Pulmonary:     Effort: Pulmonary effort is normal.  Musculoskeletal:        General: Normal range of motion.  Skin:    General: Skin is warm.  Neurological:     Mental Status: He is alert.     Neurovascular status intact muscle strength found to be adequate range of motion adequate discolored left hallux nail distal two thirds 3 years duration mild distal looseness to the bed but localized no other pathology noted     Assessment:  Mycotic nail infection left but more trauma than actual fungal infiltration which is probably secondary     Plan:  H&P reviewed educated him on condition and the differences between fungus and trauma.  At this point do not recommend treatment but it may require removal I do not see oral medicine given his health history and I do not think it will be of help and also do not think topical will be of any benefit to him

## 2022-11-27 ENCOUNTER — Other Ambulatory Visit: Payer: Self-pay | Admitting: Physician Assistant

## 2022-11-27 DIAGNOSIS — C3491 Malignant neoplasm of unspecified part of right bronchus or lung: Secondary | ICD-10-CM

## 2022-11-30 ENCOUNTER — Other Ambulatory Visit: Payer: Self-pay

## 2022-11-30 ENCOUNTER — Inpatient Hospital Stay: Payer: Medicare HMO | Attending: Internal Medicine

## 2022-11-30 ENCOUNTER — Inpatient Hospital Stay (HOSPITAL_BASED_OUTPATIENT_CLINIC_OR_DEPARTMENT_OTHER): Payer: Medicare HMO | Admitting: Internal Medicine

## 2022-11-30 VITALS — BP 126/69 | HR 63 | Temp 97.6°F | Resp 17 | Ht 71.0 in | Wt 166.2 lb

## 2022-11-30 DIAGNOSIS — Z9221 Personal history of antineoplastic chemotherapy: Secondary | ICD-10-CM | POA: Insufficient documentation

## 2022-11-30 DIAGNOSIS — C3490 Malignant neoplasm of unspecified part of unspecified bronchus or lung: Secondary | ICD-10-CM

## 2022-11-30 DIAGNOSIS — C3491 Malignant neoplasm of unspecified part of right bronchus or lung: Secondary | ICD-10-CM

## 2022-11-30 DIAGNOSIS — Z85118 Personal history of other malignant neoplasm of bronchus and lung: Secondary | ICD-10-CM | POA: Diagnosis not present

## 2022-11-30 DIAGNOSIS — C679 Malignant neoplasm of bladder, unspecified: Secondary | ICD-10-CM | POA: Diagnosis not present

## 2022-11-30 LAB — CBC WITH DIFFERENTIAL (CANCER CENTER ONLY)
Abs Immature Granulocytes: 0.01 10*3/uL (ref 0.00–0.07)
Basophils Absolute: 0 10*3/uL (ref 0.0–0.1)
Basophils Relative: 1 %
Eosinophils Absolute: 0.3 10*3/uL (ref 0.0–0.5)
Eosinophils Relative: 5 %
HCT: 36.2 % — ABNORMAL LOW (ref 39.0–52.0)
Hemoglobin: 12.4 g/dL — ABNORMAL LOW (ref 13.0–17.0)
Immature Granulocytes: 0 %
Lymphocytes Relative: 18 %
Lymphs Abs: 1.1 10*3/uL (ref 0.7–4.0)
MCH: 31.6 pg (ref 26.0–34.0)
MCHC: 34.3 g/dL (ref 30.0–36.0)
MCV: 92.3 fL (ref 80.0–100.0)
Monocytes Absolute: 0.7 10*3/uL (ref 0.1–1.0)
Monocytes Relative: 12 %
Neutro Abs: 3.9 10*3/uL (ref 1.7–7.7)
Neutrophils Relative %: 64 %
Platelet Count: 172 10*3/uL (ref 150–400)
RBC: 3.92 MIL/uL — ABNORMAL LOW (ref 4.22–5.81)
RDW: 14 % (ref 11.5–15.5)
WBC Count: 6.1 10*3/uL (ref 4.0–10.5)
nRBC: 0 % (ref 0.0–0.2)

## 2022-11-30 LAB — CMP (CANCER CENTER ONLY)
ALT: 7 U/L (ref 0–44)
AST: 12 U/L — ABNORMAL LOW (ref 15–41)
Albumin: 3.9 g/dL (ref 3.5–5.0)
Alkaline Phosphatase: 63 U/L (ref 38–126)
Anion gap: 6 (ref 5–15)
BUN: 16 mg/dL (ref 8–23)
CO2: 29 mmol/L (ref 22–32)
Calcium: 9.1 mg/dL (ref 8.9–10.3)
Chloride: 103 mmol/L (ref 98–111)
Creatinine: 1.33 mg/dL — ABNORMAL HIGH (ref 0.61–1.24)
GFR, Estimated: 54 mL/min — ABNORMAL LOW (ref >60)
Glucose, Bld: 97 mg/dL (ref 70–99)
Potassium: 4.4 mmol/L (ref 3.5–5.1)
Sodium: 138 mmol/L (ref 135–145)
Total Bilirubin: 0.9 mg/dL (ref 0.3–1.2)
Total Protein: 7 g/dL (ref 6.5–8.1)

## 2022-11-30 NOTE — Progress Notes (Signed)
[facility] Telephone:([phone]) [phone]   Fax:([phone]  OFFICE PROGRESS NOTE  [name], MD [address] [place] [address]  DIAGNOSIS: Stage IIIC (T3, N3, M0) non-small cell lung cancer, adenocarcinoma presented with right upper lobe lung mass status post right upper lobe wedge resection but no lymph node dissection.  The tumor measured 3.3 cm with visceral pleural involvement and very close resection margin less than 0.1 cm from the tumor.  This was performed on May 13, 2021 in the same setting of CABG under the care of Dr. Cliffton Asters.  PET scan performed in March 2023 showed a right upper lobe nodule adjacent to the wedge resection site small hypermetabolic right hilar, right subcarinal, and left infrahilar lymph nodes which could be reactive but are suspicious for nodal involvement.   Molecular studies showed no actionable mutations and PD-L1 expression was 0%   PRIOR THERAPY:  1) Right upper lobe wedge resection under the care of Dr. Cliffton Asters on 05/13/2021. 2) Concurrent chemoradiation with weekly carboplatin for AUC of 2 and paclitaxel 45 Mg/M2.  First dose Aug 11, 2021.  Status post 6 cycles.  Last dose was given September 22, 2021. 3) Consolidation treatment with immunotherapy with Imfinzi 1500 Mg IV every 4 weeks.  First dose October 27, 2021.  Status post 13 cycles.   CURRENT THERAPY: Observation.  INTERVAL HISTORY: Kevin [name] 82 y.o. male returns to the clinic today for follow-up visit accompanied by his wife.  The patient is feeling fine today with no concerning complaints.  He denied having any current chest pain, shortness of breath, cough or hemoptysis.  He has no nausea, vomiting, diarrhea or constipation.  He has no headache or visual changes.  He has no recent weight loss or night sweats.  He tolerated his previous course of consolidation treatment with immunotherapy fairly well.  He was found to have bladder cancer and currently undergoing evaluation by  Dr. Retta Diones.  He is here today for evaluation with repeat CT scan of the chest for restaging of his disease.  MEDICAL HISTORY: Past Medical History:  Diagnosis Date   Arthritis    Coronary artery disease    Hyperlipidemia    SCC (squamous cell carcinoma) 05/28/2004   top of left hand curet x3 1fu   SCC (squamous cell carcinoma) 09/13/2013   mid back tx cx3 7fu   SCC (squamous cell carcinoma) 09/06/2018   left wrist tx with bx   Squamous cell carcinoma of skin 11/16/2003   left ear tip tx curet and cautery 41fu    ALLERGIES:  is allergic to atorvastatin.  MEDICATIONS:  Current Outpatient Medications  Medication Sig Dispense Refill   acetaminophen (TYLENOL) 500 MG tablet Take 500 mg by mouth daily as needed for moderate pain, fever or headache.     aspirin EC 81 MG tablet Take 81 mg by mouth daily.     metoprolol tartrate (LOPRESSOR) 25 MG tablet TAKE 1 TABLET BY MOUTH TWICE A DAY 180 tablet 3   nitroGLYCERIN (NITROSTAT) 0.4 MG SL tablet Place 0.4 mg under the tongue every 5 (five) minutes as needed for chest pain.     rosuvastatin (CRESTOR) 20 MG tablet Take 1 tablet (20 mg total) by mouth daily. 90 tablet 3   No current facility-administered medications for this visit.    SURGICAL HISTORY:  Past Surgical History:  Procedure Laterality Date   BRONCHIAL BIOPSY  07/22/2021   Procedure: BRONCHIAL BIOPSIES;  Surgeon: Leslye Peer,  MD;  Location: [facility] ENDOSCOPY;  Service: Cardiopulmonary;;   BRONCHIAL BRUSHINGS  07/22/2021   Procedure: BRONCHIAL BRUSHINGS;  Surgeon: Leslye Peer, MD;  Location: [facility] ENDOSCOPY;  Service: Cardiopulmonary;;   BRONCHIAL NEEDLE ASPIRATION BIOPSY  07/22/2021   Procedure: BRONCHIAL NEEDLE ASPIRATION BIOPSIES;  Surgeon: Leslye Peer, MD;  Location: [facility] ENDOSCOPY;  Service: Cardiopulmonary;;   CHOLECYSTECTOMY N/A 02/15/2022   Procedure: LAPAROSCOPIC CHOLECYSTECTOMY;  Surgeon: Lucretia Roers, MD;  Location: [facility] ORS;  Service: General;  Laterality: N/A;    CORONARY ARTERY BYPASS GRAFT N/A 05/13/2021   Procedure: CORONARY ARTERY BYPASS GRAFTING (CABG) TIMES 3  , ON PUMP, USING LEFT INTERNAL MAMMARY ARTERY AND RIGHT GREATER SAPHENOUS VEIN HARVESTED ENDOSCOPICALLY;  Surgeon: Corliss Skains, MD;  Location: [facility] OR;  Service: Open Heart Surgery;  Laterality: N/A;   ENDOVEIN HARVEST OF GREATER SAPHENOUS VEIN Right 05/13/2021   Procedure: ENDOVEIN HARVEST OF GREATER SAPHENOUS VEIN;  Surgeon: Corliss Skains, MD;  Location: [facility] OR;  Service: Open Heart Surgery;  Laterality: Right;   epidural     back pain   IR CT SPINE LTD  06/18/2020   IR KYPHO LUMBAR INC FX REDUCE BONE BX UNI/BIL CANNULATION INC/IMAGING  06/18/2020   LEFT HEART CATH AND CORONARY ANGIOGRAPHY N/A 05/06/2021   Procedure: LEFT HEART CATH AND CORONARY ANGIOGRAPHY;  Surgeon: Yates Decamp, MD;  [facility]: [facility] INVASIVE CV LAB;  Service: Cardiovascular;  Laterality: N/A;   LUNG BIOPSY Right 05/13/2021   Procedure: RIGHT LUNG BIOPSY;  Surgeon: Corliss Skains, MD;  Location: [facility] OR;  Service: Open Heart Surgery;  Laterality: Right;   MINOR HEMORRHOIDECTOMY     SHOULDER SURGERY     TEE WITHOUT CARDIOVERSION N/A 05/13/2021   Procedure: TRANSESOPHAGEAL ECHOCARDIOGRAM (TEE);  Surgeon: Corliss Skains, MD;  Location: [facility] OR;  Service: Open Heart Surgery;  Laterality: N/A;   TRANSURETHRAL RESECTION OF BLADDER TUMOR  2014   TRANSURETHRAL RESECTION OF BLADDER TUMOR N/A 05/19/2018   Procedure: TRANSURETHRAL RESECTION OF BLADDER TUMOR (TURBT) AND (INTRAVESICAL GEMCITABINE INSTILLED IN BLADDER IN PACU) ;  Surgeon: Marcine Matar, MD;  Location: [facility] ORS;  Service: Urology;  Laterality: N/A;  30 MINS   VIDEO BRONCHOSCOPY WITH ENDOBRONCHIAL ULTRASOUND Bilateral 07/22/2021   Procedure: VIDEO BRONCHOSCOPY WITH ENDOBRONCHIAL ULTRASOUND;  Surgeon: Leslye Peer, MD;  Location: [facility] ENDOSCOPY;  Service: Cardiopulmonary;  Laterality: Bilateral;    REVIEW OF SYSTEMS:  Constitutional: negative Eyes:  negative Ears, nose, mouth, throat, and face: negative Respiratory: negative Cardiovascular: negative Gastrointestinal: negative Genitourinary:negative Integument/breast: negative Hematologic/lymphatic: negative Musculoskeletal:negative Neurological: negative Behavioral/Psych: positive for sleep disturbance Endocrine: negative Allergic/Immunologic: negative   PHYSICAL EXAMINATION: General appearance: alert, cooperative, and no distress Head: Normocephalic, without obvious abnormality, atraumatic Neck: no adenopathy, no JVD, supple, symmetrical, trachea midline, and thyroid not enlarged, symmetric, no tenderness/mass/nodules Lymph nodes: Cervical, supraclavicular, and axillary nodes normal. Resp: clear to auscultation bilaterally Back: symmetric, no curvature. ROM normal. No CVA tenderness. Cardio: regular rate and rhythm, S1, S2 normal, no murmur, click, rub or gallop GI: soft, non-tender; bowel sounds normal; no masses,  no organomegaly Extremities: extremities normal, atraumatic, no cyanosis or edema Neurologic: Alert and oriented X 3, normal strength and tone. Normal symmetric reflexes. Normal coordination and gait  ECOG PERFORMANCE STATUS: 0 - Asymptomatic  Blood pressure 126/69, pulse 63, temperature 97.6 F (36.4 C), temperature source Oral, resp. rate 17, height 5\' 11"  (1.803 m), weight 166 lb 3.2 oz (75.4 kg), SpO2 100%.  LABORATORY DATA: Lab Results  Component Value Date  WBC 6.1 11/30/2022   HGB 12.4 (L) 11/30/2022   HCT 36.2 (L) 11/30/2022   MCV 92.3 11/30/2022   PLT 172 11/30/2022      Chemistry      Component Value Date/Time   NA 138 11/30/2022 0845   NA 139 05/01/2021 1146   K 4.4 11/30/2022 0845   CL 103 11/30/2022 0845   CO2 29 11/30/2022 0845   BUN 16 11/30/2022 0845   BUN 20 05/01/2021 1146   CREATININE 1.33 (H) 11/30/2022 0845      Component Value Date/Time   CALCIUM 9.1 11/30/2022 0845   ALKPHOS 63 11/30/2022 0845   AST 12 (L) 11/30/2022  0845   ALT 7 11/30/2022 0845   BILITOT 0.9 11/30/2022 0845       RADIOGRAPHIC STUDIES: CT Chest W Contrast  Result Date: 11/26/2022 CLINICAL DATA:  Non-small cell lung cancer, now metastatic, assess treatment response. Radiation therapy and chemotherapy complete. * Tracking Code: BO * EXAM: CT CHEST WITH CONTRAST TECHNIQUE: Multidetector CT imaging of the chest was performed during intravenous contrast administration. RADIATION DOSE REDUCTION: This exam was performed according to the departmental dose-optimization program which includes automated exposure control, adjustment of the mA and/or kV according to patient size and/or use of iterative reconstruction technique. CONTRAST:  75mL OMNIPAQUE IOHEXOL 300 MG/ML  SOLN COMPARISON:  07/30/2022. FINDINGS: Cardiovascular: Atherosclerotic calcification of the aorta. Heart is enlarged with left ventricular dilatation. No pericardial effusion. Mediastinum/Nodes: No pathologically enlarged mediastinal, hilar or axillary lymph nodes. Esophagus is grossly unremarkable. Lungs/Pleura: Bullous paraseptal and centrilobular emphysema. Postsurgical and post treatment architectural distortion in the upper right hemithorax. Additional post treatment consolidation and parenchymal retraction in the medial left hemithorax. Trace loculated right pleural fluid. No suspicious pulmonary nodules. No left pleural fluid. Airway is otherwise unremarkable. Upper Abdomen: Visualized portions of the liver and adrenal glands are unremarkable. Small stone in the right kidney. Probable renal sinus cysts bilaterally. No specific follow-up necessary. Visualized portions of the kidneys, spleen, pancreas, stomach and bowel are otherwise grossly unremarkable. No upper abdominal adenopathy. Cholecystectomy. Musculoskeletal: Degenerative changes in the spine. IMPRESSION: 1. Post treatment changes in the lungs bilaterally. No evidence of recurrent or metastatic disease. 2. Trace loculated right  pleural fluid. 3. Small right renal stone. 4.  Aortic atherosclerosis (ICD10-I70.0). 5.  Emphysema (ICD10-J43.9). Electronically Signed   By: Leanna Battles M.D.   On: 11/26/2022 14:03    ASSESSMENT AND PLAN: This is a very pleasant 82 years old white male diagnosed with stage IIIC (T3, N3, M0) non-small cell lung cancer, adenocarcinoma with no actionable mutations and PD-L1 expression of 0% diagnosed in January 2023 status post wedge resection of the right upper lobe lung mass in a setting of CABG under the care of Dr. Cliffton Asters but the PET scan showed residual disease in the right upper lobe in addition to hypermetabolic right hilar, subcarinal and left infrahilar lymph nodes. The patient was seen by Dr. Delton Coombes and biopsy of the right upper lobe as well as the subcarinal and left hilar lymph nodes were positive for malignancy consistent with non-small cell lung cancer, adenocarcinoma. He underwent a course of concurrent chemoradiation with weekly carboplatin for AUC of 2 and paclitaxel 45 Mg/M2 status post 6 cycles.  The patient tolerated this previous course of concurrent chemoradiation fairly well except for fatigue and mild odynophagia. The patient underwent a course of consolidation treatment with immunotherapy with Imfinzi 1500 Mg IV every 4 weeks status post 13 cycles.    The patient tolerated  his previous course of consolidation immunotherapy fairly well. He had repeat CT scan of the chest performed recently.  I personally and independently reviewed the scan and discussed the result with the patient and his wife. His scan showed no concerning findings for disease progression. I recommended for him to continue on observation with repeat CT scan of the chest in 3 months. For the recently diagnosed bladder cancer, he is being evaluated by urology by Dr. Retta Diones. He was advised to call immediately if he has any other concerning symptoms in the interval. The patient voices understanding of current  disease status and treatment options and is in agreement with the current care plan. All questions were answered. The patient knows to call the clinic with any problems, questions or concerns. We can certainly see the patient much sooner if necessary. The total time spent in the appointment was 30 minutes.   Disclaimer: This note was dictated with voice recognition software. Similar sounding words can inadvertently be transcribed and may not be corrected upon review.

## 2022-12-03 ENCOUNTER — Other Ambulatory Visit: Payer: Self-pay

## 2022-12-03 ENCOUNTER — Encounter (HOSPITAL_BASED_OUTPATIENT_CLINIC_OR_DEPARTMENT_OTHER): Payer: Self-pay | Admitting: Urology

## 2022-12-03 DIAGNOSIS — Z299 Encounter for prophylactic measures, unspecified: Secondary | ICD-10-CM | POA: Diagnosis not present

## 2022-12-03 DIAGNOSIS — I10 Essential (primary) hypertension: Secondary | ICD-10-CM | POA: Diagnosis not present

## 2022-12-03 DIAGNOSIS — H6122 Impacted cerumen, left ear: Secondary | ICD-10-CM | POA: Diagnosis not present

## 2022-12-03 NOTE — Progress Notes (Addendum)
Spoke w/ via phone for pre-op interview---Kevin [name] needs dos----  none             [name] results------11/30/22 cbc, cmp, , 07/16/22 EKG in Epic & chart, 11/23/22 Chest CT COVID test -----patient states asymptomatic no test needed Arrive at -------0800 on Thursday, 12/10/22 NPO after MN NO Solid Food.  Clear liquids from MN until---0700 Med rec completed Medications to take morning of surgery -----rosuvastatin, metoprolol Diabetic medication -----n/a Patient instructed no nail polish to be worn day of surgery Patient instructed to bring photo id and insurance card day of surgery Patient aware to have Driver (ride ) / caregiver    for 24 hours after surgery - wife, Kevin [name] Patient Special Instructions -----none Pre-Op special Instructions -----none Patient verbalized understanding of instructions that were given at this phone interview. Patient denies shortness of breath, chest pain, fever, cough at this phone interview.  Patient has a hx of CAD, s/p CABG x 3. Lung cancer, s/p RUL wedge resection with chemoradiation and immunotherapy.   05/13/21 Echocardiogram in Epic 06/29/22 LOV w/ pulmonolgist, Dr. Elige Radon Icard in Epic. 07/16/22 LOV w/ cardiologist, Dr. Yates Decamp in Epic. 11/30/22 LOV w/ oncologist, Dr. Arbutus Ped in Epic.

## 2022-12-03 NOTE — H&P (View-Only) (Signed)
 Spoke w/ via phone for pre-op interview---Kevin [name] needs dos----  none             [name] results------11/30/22 cbc, cmp, , 07/16/22 EKG in Epic & chart, 11/23/22 Chest CT COVID test -----patient states asymptomatic no test needed Arrive at -------0800 on Thursday, 12/10/22 NPO after MN NO Solid Food.  Clear liquids from MN until---0700 Med rec completed Medications to take morning of surgery -----rosuvastatin, metoprolol Diabetic medication -----n/a Patient instructed no nail polish to be worn day of surgery Patient instructed to bring photo id and insurance card day of surgery Patient aware to have Driver (ride ) / caregiver    for 24 hours after surgery - wife, Kevin [name] Patient Special Instructions -----none Pre-Op special Instructions -----none Patient verbalized understanding of instructions that were given at this phone interview. Patient denies shortness of breath, chest pain, fever, cough at this phone interview.  Patient has a hx of CAD, s/p CABG x 3. Lung cancer, s/p RUL wedge resection with chemoradiation and immunotherapy.   05/13/21 Echocardiogram in Epic 06/29/22 LOV w/ pulmonolgist, Dr. Elige Radon Icard in Epic. 07/16/22 LOV w/ cardiologist, Dr. Yates Decamp in Epic. 11/30/22 LOV w/ oncologist, Dr. Arbutus Ped in Epic.

## 2022-12-10 ENCOUNTER — Other Ambulatory Visit: Payer: Self-pay

## 2022-12-10 ENCOUNTER — Ambulatory Visit (HOSPITAL_BASED_OUTPATIENT_CLINIC_OR_DEPARTMENT_OTHER)
Admission: RE | Admit: 2022-12-10 | Discharge: 2022-12-10 | Disposition: A | Discharge status: Home / Self Care | Payer: Medicare HMO | Attending: Urology | Admitting: Urology

## 2022-12-10 ENCOUNTER — Encounter (HOSPITAL_BASED_OUTPATIENT_CLINIC_OR_DEPARTMENT_OTHER): Payer: Self-pay | Admitting: Urology

## 2022-12-10 ENCOUNTER — Ambulatory Visit (HOSPITAL_BASED_OUTPATIENT_CLINIC_OR_DEPARTMENT_OTHER): Payer: Medicare HMO | Admitting: Certified Registered Nurse Anesthetist

## 2022-12-10 ENCOUNTER — Encounter (HOSPITAL_BASED_OUTPATIENT_CLINIC_OR_DEPARTMENT_OTHER)
Admission: RE | Disposition: A | Discharge status: Home / Self Care | Payer: Self-pay | Source: Home / Self Care | Attending: Urology

## 2022-12-10 DIAGNOSIS — E782 Mixed hyperlipidemia: Secondary | ICD-10-CM

## 2022-12-10 DIAGNOSIS — Z8551 Personal history of malignant neoplasm of bladder: Secondary | ICD-10-CM | POA: Insufficient documentation

## 2022-12-10 DIAGNOSIS — Z87891 Personal history of nicotine dependence: Secondary | ICD-10-CM

## 2022-12-10 DIAGNOSIS — I2510 Atherosclerotic heart disease of native coronary artery without angina pectoris: Secondary | ICD-10-CM

## 2022-12-10 DIAGNOSIS — Z951 Presence of aortocoronary bypass graft: Secondary | ICD-10-CM | POA: Insufficient documentation

## 2022-12-10 DIAGNOSIS — Z01818 Encounter for other preprocedural examination: Secondary | ICD-10-CM

## 2022-12-10 DIAGNOSIS — N400 Enlarged prostate without lower urinary tract symptoms: Secondary | ICD-10-CM | POA: Insufficient documentation

## 2022-12-10 DIAGNOSIS — N210 Calculus in bladder: Secondary | ICD-10-CM | POA: Diagnosis not present

## 2022-12-10 DIAGNOSIS — E7800 Pure hypercholesterolemia, unspecified: Secondary | ICD-10-CM | POA: Diagnosis not present

## 2022-12-10 HISTORY — DX: Malignant neoplasm of unspecified part of unspecified bronchus or lung: C34.90

## 2022-12-10 HISTORY — DX: Personal history of antineoplastic chemotherapy: Z92.3

## 2022-12-10 HISTORY — PX: HOLMIUM LASER APPLICATION: SHX5852

## 2022-12-10 HISTORY — DX: Personal history of other medical treatment: Z92.89

## 2022-12-10 HISTORY — DX: Personal history of malignant neoplasm of bladder: Z85.51

## 2022-12-10 HISTORY — DX: Personal history of irradiation: Z92.21

## 2022-12-10 HISTORY — PX: CYSTOSCOPY WITH LITHOLAPAXY: SHX1425

## 2022-12-10 SURGERY — CYSTOSCOPY, WITH BLADDER CALCULUS LITHOLAPAXY
Anesthesia: General | Site: Bladder

## 2022-12-10 MED ORDER — SODIUM CHLORIDE 0.9 % IR SOLN
Status: DC | PRN
  Administered 2022-12-10: 3000 mL

## 2022-12-10 MED ORDER — 0.9 % SODIUM CHLORIDE (POUR BTL) OPTIME
TOPICAL | Status: DC | PRN
Start: 2022-12-10 — End: 2022-12-10
  Administered 2022-12-10: 500 mL

## 2022-12-10 MED ORDER — ONDANSETRON HCL 4 MG/2ML IJ SOLN
INTRAMUSCULAR | Status: DC | PRN
  Administered 2022-12-10: 4 mg via INTRAVENOUS

## 2022-12-10 MED ORDER — FENTANYL CITRATE (PF) 250 MCG/5ML IJ SOLN
INTRAMUSCULAR | Status: DC | PRN
  Administered 2022-12-10: 50 ug via INTRAVENOUS

## 2022-12-10 MED ORDER — PHENYLEPHRINE 80 MCG/ML (10ML) SYRINGE FOR IV PUSH (FOR BLOOD PRESSURE SUPPORT)
PREFILLED_SYRINGE | INTRAVENOUS | Status: DC | PRN
  Administered 2022-12-10 (×3): 80 ug via INTRAVENOUS

## 2022-12-10 MED ORDER — FENTANYL CITRATE (PF) 100 MCG/2ML IJ SOLN
INTRAMUSCULAR | Status: AC
  Filled 2022-12-10: qty 2

## 2022-12-10 MED ORDER — EPHEDRINE SULFATE-NACL 50-0.9 MG/10ML-% IV SOSY
PREFILLED_SYRINGE | INTRAVENOUS | Status: DC | PRN
  Administered 2022-12-10 (×2): 5 mg via INTRAVENOUS

## 2022-12-10 MED ORDER — ONDANSETRON HCL 4 MG/2ML IJ SOLN: 4.0000 mg | Freq: Four times a day (QID) | INTRAMUSCULAR | Status: DC | PRN

## 2022-12-10 MED ORDER — PROPOFOL 10 MG/ML IV BOLUS
INTRAVENOUS | Status: DC | PRN
  Administered 2022-12-10: 200 mg via INTRAVENOUS

## 2022-12-10 MED ORDER — OXYCODONE HCL 5 MG/5ML PO SOLN: 5.0000 mg | Freq: Once | ORAL | Status: DC | PRN

## 2022-12-10 MED ORDER — STERILE WATER FOR IRRIGATION IR SOLN
Status: DC | PRN
  Administered 2022-12-10: 3000 mL

## 2022-12-10 MED ORDER — LACTATED RINGERS IV SOLN: INTRAVENOUS | Status: DC

## 2022-12-10 MED ORDER — OXYCODONE HCL 5 MG PO TABS: 5.0000 mg | ORAL_TABLET | Freq: Once | ORAL | Status: DC | PRN

## 2022-12-10 MED ORDER — CEFAZOLIN SODIUM-DEXTROSE 2-4 GM/100ML-% IV SOLN
INTRAVENOUS | Status: AC
  Filled 2022-12-10: qty 100

## 2022-12-10 MED ORDER — CEFAZOLIN SODIUM-DEXTROSE 2-4 GM/100ML-% IV SOLN
2.0000 g | INTRAVENOUS | Status: AC
  Administered 2022-12-10: 2 g via INTRAVENOUS

## 2022-12-10 MED ORDER — FENTANYL CITRATE (PF) 100 MCG/2ML IJ SOLN: 25.0000 ug | INTRAMUSCULAR | Status: DC | PRN

## 2022-12-10 MED ORDER — PROPOFOL 10 MG/ML IV BOLUS
INTRAVENOUS | Status: AC
  Filled 2022-12-10: qty 20

## 2022-12-10 MED ORDER — LIDOCAINE 2% (20 MG/ML) 5 ML SYRINGE
INTRAMUSCULAR | Status: DC | PRN
  Administered 2022-12-10: 40 mg via INTRAVENOUS

## 2022-12-10 SURGICAL SUPPLY — 20 items
BAG DRAIN URO-CYSTO SKYTR STRL (DRAIN) ×1 IMPLANT
BAG DRN UROCATH (DRAIN) ×1
CLOTH BEACON ORANGE TIMEOUT ST (SAFETY) ×2 IMPLANT
ELECT REM PT RETURN 9FT ADLT (ELECTROSURGICAL) ×1
ELECTRODE REM PT RTRN 9FT ADLT (ELECTROSURGICAL) IMPLANT
GLOVE BIO SURGEON STRL SZ8 (GLOVE) ×1 IMPLANT
GLOVE SURG SYN 6.5 ES PF (GLOVE) ×2
GLOVE SURG SYN 6.5 PF PI (GLOVE) IMPLANT
GOWN STRL REUS W/ TWL LRG LVL3 (GOWN DISPOSABLE) IMPLANT
GOWN STRL REUS W/TWL LRG LVL3 (GOWN DISPOSABLE) ×1
GOWN STRL REUS W/TWL XL LVL3 (GOWN DISPOSABLE) ×1 IMPLANT
IV NS 1000ML (IV SOLUTION) ×1
IV NS 1000ML BAXH (IV SOLUTION) IMPLANT
KIT TURNOVER CYSTO (KITS) ×1 IMPLANT
LASER FIB FLEXIVA PULSE ID 365 (Laser) IMPLANT
MANIFOLD NEPTUNE II (INSTRUMENTS) ×1 IMPLANT
NS IRRIG 500ML POUR BTL (IV SOLUTION) IMPLANT
PACK CYSTO (CUSTOM PROCEDURE TRAY) ×1 IMPLANT
SLEEVE SCD COMPRESS KNEE MED (STOCKING) ×1 IMPLANT
TUBE CONNECTING 12X1/4 (SUCTIONS) ×1 IMPLANT

## 2022-12-10 NOTE — Anesthesia Preprocedure Evaluation (Signed)
Anesthesia Evaluation  Patient identified by MRN, date of birth, ID band Patient awake    Reviewed: Allergy & Precautions, H&P , NPO status , Patient's Chart, lab work & pertinent test results  Airway Mallampati: II   Neck ROM: full    Dental   Pulmonary former smoker   breath sounds clear to auscultation       Cardiovascular + CAD and + CABG   Rhythm:regular Rate:Normal     Neuro/Psych    GI/Hepatic   Endo/Other    Renal/GU      Musculoskeletal   Abdominal   Peds  Hematology   Anesthesia Other Findings   Reproductive/Obstetrics                             Anesthesia Physical Anesthesia Plan  ASA: 3  Anesthesia Plan: General   Post-op Pain Management:    Induction: Intravenous  PONV Risk Score and Plan: 2 and Ondansetron, Dexamethasone and Treatment may vary due to age or medical condition  Airway Management Planned: LMA  Additional Equipment:   Intra-op Plan:   Post-operative Plan: Extubation in OR  Informed Consent: I have reviewed the patients History and Physical, chart, labs and discussed the procedure including the risks, benefits and alternatives for the proposed anesthesia with the patient or authorized representative who has indicated his/her understanding and acceptance.     Dental advisory given  Plan Discussed with: CRNA, Anesthesiologist and Surgeon  Anesthesia Plan Comments:        Anesthesia Quick Evaluation

## 2022-12-10 NOTE — H&P (Signed)
H&P  Chief Complaint: bladder stone  History of Present Illness: 82 yo male w/ h/u BPH (s/p Urolift) as well as bladder ca presents for cystolithalopaxy. 12 mm stone seen on recent surveillance cysto.  Past Medical History:  Diagnosis Date   Coronary artery disease    CABG x 3 on 05/13/21, Follows w/ [facility].   History of immunotherapy    Imfinzi 1500 mg every 4 weeks, began 10/27/21.   Hx of bladder cancer    s/p multiple TURBT, follows w/ Dr. Retta Diones   Hyperlipidemia    Lung cancer (HCC)    RUL lung mass, s/p right uppler lobe wedge resection, Stage IIIc, non-small cell lung cancer, adenocarcinoma, Follows with [facility].   SCC (squamous cell carcinoma) 05/28/2004   top of left hand curet x3 37fu   SCC (squamous cell carcinoma) 09/13/2013   mid back tx cx3 17fu   SCC (squamous cell carcinoma) 09/06/2018   left wrist tx with bx   Squamous cell carcinoma of skin 11/16/2003   left ear tip tx curet and cautery 32fu   Status post chemoradiation    Concurrent chemoradiation with weekly carboplatin for AUC of 2 and paclitaxel 45 mg/M2. First dose 08/11/2021. Status post 6 cycles. Last dose 09/22/21.    Past Surgical History:  Procedure Laterality Date   BRONCHIAL BIOPSY  07/22/2021   Procedure: BRONCHIAL BIOPSIES;  Surgeon: Leslye Peer, MD;  Location: [facility] ENDOSCOPY;  Service: Cardiopulmonary;;   BRONCHIAL BRUSHINGS  07/22/2021   Procedure: BRONCHIAL BRUSHINGS;  Surgeon: Leslye Peer, MD;  Location: [facility] ENDOSCOPY;  Service: Cardiopulmonary;;   BRONCHIAL NEEDLE ASPIRATION BIOPSY  07/22/2021   Procedure: BRONCHIAL NEEDLE ASPIRATION BIOPSIES;  Surgeon: Leslye Peer, MD;  Location: [facility] ENDOSCOPY;  Service: Cardiopulmonary;;   CHOLECYSTECTOMY N/A 02/15/2022   Procedure: LAPAROSCOPIC CHOLECYSTECTOMY;  Surgeon: Lucretia Roers, MD;  Location: [facility] ORS;  Service: General;  Laterality: N/A;   CORONARY ARTERY BYPASS GRAFT N/A 05/13/2021   Procedure: CORONARY  ARTERY BYPASS GRAFTING (CABG) TIMES 3  , ON PUMP, USING LEFT INTERNAL MAMMARY ARTERY AND RIGHT GREATER SAPHENOUS VEIN HARVESTED ENDOSCOPICALLY;  Surgeon: Corliss Skains, MD;  Location: [facility] OR;  Service: Open Heart Surgery;  Laterality: N/A;   ENDOVEIN HARVEST OF GREATER SAPHENOUS VEIN Right 05/13/2021   Procedure: ENDOVEIN HARVEST OF GREATER SAPHENOUS VEIN;  Surgeon: Corliss Skains, MD;  Location: [facility] OR;  Service: Open Heart Surgery;  Laterality: Right;   epidural     back pain   IR CT SPINE LTD  06/18/2020   IR KYPHO LUMBAR INC FX REDUCE BONE BX UNI/BIL CANNULATION INC/IMAGING  06/18/2020   LEFT HEART CATH AND CORONARY ANGIOGRAPHY N/A 05/06/2021   Procedure: LEFT HEART CATH AND CORONARY ANGIOGRAPHY;  Surgeon: Yates Decamp, MD;  [facility] INVASIVE CV LAB;  Service: Cardiovascular;  Laterality: N/A;   LUNG BIOPSY Right 05/13/2021   Procedure: RIGHT LUNG BIOPSY;  Surgeon: Corliss Skains, MD;  Location: [facility] OR;  Service: Open Heart Surgery;  Laterality: Right;   MINOR HEMORRHOIDECTOMY     SHOULDER SURGERY     TEE WITHOUT CARDIOVERSION N/A 05/13/2021   Procedure: TRANSESOPHAGEAL ECHOCARDIOGRAM (TEE);  Surgeon: Corliss Skains, MD;  Location: [facility] OR;  Service: Open Heart Surgery;  Laterality: N/A;   TRANSURETHRAL RESECTION OF BLADDER TUMOR  2014   TRANSURETHRAL RESECTION OF BLADDER TUMOR N/A 05/19/2018   Procedure: TRANSURETHRAL RESECTION OF BLADDER TUMOR (TURBT) AND (INTRAVESICAL GEMCITABINE INSTILLED IN BLADDER IN PACU) ;  Surgeon: Retta Diones,  Jeannett Senior, MD;  Location: [facility] ORS;  Service: Urology;  Laterality: N/A;  30 MINS   VIDEO BRONCHOSCOPY WITH ENDOBRONCHIAL ULTRASOUND Bilateral 07/22/2021   Procedure: VIDEO BRONCHOSCOPY WITH ENDOBRONCHIAL ULTRASOUND;  Surgeon: Leslye Peer, MD;  Location: [facility] ENDOSCOPY;  Service: Cardiopulmonary;  Laterality: Bilateral;    Home Medications:    Allergies:  Allergies  Allergen Reactions   Atorvastatin Cough    Family History  Problem Relation  Age of Onset   Alzheimer's disease Mother 68   Congestive Heart Failure Father 57   Heart disease Brother 40   Heart failure Brother 36   Heart failure Brother 75    Social History:  reports that he quit smoking about 27 years ago. His smoking use included cigarettes. He started smoking about 57 years ago. He has a 30 pack-year smoking history. He has never used smokeless tobacco. He reports current alcohol use. He reports that he does not use drugs.  ROS: A complete review of systems was performed.  All systems are negative except for pertinent findings as noted.  Physical Exam:  Vital signs in last 24 hours: BP 138/73   Pulse 65   Temp (!) 97.5 F (36.4 C) (Oral)   Resp 17   Ht 5\' 10"  (1.778 m)   Wt 75 kg   SpO2 97%   BMI 23.72 kg/m  Constitutional:  Alert and oriented, No acute distress Cardiovascular: Regular rate  Respiratory: Normal respiratory effort Neurologic: Grossly intact, no focal deficits Psychiatric: Normal mood and affect  I have reviewed prior pt notes  I have reviewed notes from referring/previous physicians  I have reviewed urinalysis results  I have independently reviewed prior imaging    Impression/Assessment:  12 mm bladder calculus  Plan:  Cystolithalopaxy

## 2022-12-10 NOTE — Transfer of Care (Signed)
Immediate Anesthesia Transfer of Care Note  Patient: Kevin [name]  Procedure(s) Performed: CYSTOSCOPY WITH LITHOLAPAXY and FULGURATION (Bladder) HOLMIUM LASER APPLICATION (Bladder)  Patient Location: PACU  Anesthesia Type:General  Level of Consciousness: sedated  Airway & Oxygen Therapy: Patient Spontanous Breathing  Post-op Assessment: Report given to RN and Post -op Vital signs reviewed and stable  Post vital signs: Reviewed and stable  Last Vitals:  Vitals Value Taken Time  BP 106/58 12/10/22 1030  Temp 36.2 C 12/10/22 1030  Pulse 63 12/10/22 1035  Resp 12 12/10/22 1035  SpO2 92 % 12/10/22 1035  Vitals shown include unfiled device data.  Last Pain:  Vitals:   12/10/22 1030  TempSrc:   PainSc: Asleep      Patients Stated Pain Goal: 8 (12/10/22 1030)  Complications: No notable events documented.

## 2022-12-10 NOTE — Interval H&P Note (Signed)
History and Physical Interval Note:  12/10/2022 9:33 AM  Kevin [name]  has presented today for surgery, with the diagnosis of bladder stone.  The various methods of treatment have been discussed with the patient and family. After consideration of risks, benefits and other options for treatment, the patient has consented to  Procedure(s): CYSTOSCOPY WITH LITHOLAPAXY (N/A) HOLMIUM LASER APPLICATION (N/A) as a surgical intervention.  The patient's history has been reviewed, patient examined, no change in status, stable for surgery.  I have reviewed the patient's chart and labs.  Questions were answered to the patient's satisfaction.     Bertram Millard Mayela Bullard

## 2022-12-10 NOTE — Op Note (Signed)
Preoperative diagnosis: Bladder calculus, 12 mm  Postoperative diagnosis: Same  Principal procedure: Cystolitholapaxy of 12 mm bladder calculus, fulguration of bladder neck  Surgeon: Laikynn Pollio  Anesthesia: General With LMA  Complications: None  Estimated blood loss: Less than 10 mL  Specimen: Stone fragments  Indications: 82 year old male with history of bladder cancer and BPH.  He several years out from a UroLift.  Recent cystoscopy performed for surveillance of his bladder cancer revealed a bladder calculus at the bladder neck on the left.  Approximately 12 mm in size.  He presents at this time for cystolitholapaxy.  It is felt that this is related to a capsular tab from his UroLift.  Findings: Urethra was normal.  Prostate was nonobstructive.  There were no urothelial lesions.  There was a stone adherent to the bladder neck on the left side at approximately the 4 o'clock position.  Once the stone was treated, there were 2 capsular tabs in this area.  Description of procedure: Patient properly identified in the holding area, taken to the operating room where general anesthetic was administered with the LMA.  He is placed in the dorsolithotomy position.  Genitalia and perineum were prepped, draped, proper timeout performed.  21 French panendoscope advanced through the urethra and into the bladder.  Circumferential inspection was then performed with the above-mentioned findings noted.  365 m fiber was utilized to apply holmium laser energy to the stone which fragmented without difficulty.  Underneath there were 2 capsular tabs.  These were removed using cold cup biopsy forceps and dropped into the bladder.  Following this, the stone fragments as well as the capsular tabs were removed from the bladder directly with the cystoscope.  The area where the capsular tabs were extracted was bleeding a small amount.  This was then fulgurated with the Bugbee electrode.  At this point hemostasis was  excellent, there are no foreign bodies left in the bladder.  The scope was removed with a small amount of water remaining in the bladder for voiding trial later on.  At this point the patient was awakened, taken to the PACU in stable condition having tolerated the procedure well.

## 2022-12-10 NOTE — Discharge Instructions (Addendum)
You may see some blood in the urine and may have some burning with urination for 48-72 hours. You also may notice that you have to urinate more frequently or urgently after your procedure which is normal.  You should call should you develop an inability urinate, fever > 101, persistent nausea and vomiting that prevents you from eating or drinking to stay hydrated.  If you have a stent, you will likely urinate more frequently and urgently until the stent is removed and you may experience some discomfort/pain in the lower abdomen and flank especially when urinating. You may take pain medication prescribed to you if needed for pain. You may also intermittently have blood in the urine until the stent is removed. If you have a catheter, you will be taught how to take care of the catheter by the nursing staff prior to discharge from the hospital.  You may periodically feel a strong urge to void with the catheter in place.  This is a bladder spasm and most often can occur when having a bowel movement or moving around. It is typically self-limited and usually will stop after a few minutes.  You may use some Vaseline or Neosporin around the tip of the catheter to reduce friction at the tip of the penis. You may also see some blood in the urine.  A very small amount of blood can make the urine look quite red.  As long as the catheter is draining well, there usually is not a problem.  However, if the catheter is not draining well and is bloody, you should call the office [phone]) to notify us.   Post Anesthesia Home Care Instructions  Activity: Get plenty of rest for the remainder of the day. A responsible individual must stay with you for 24 hours following the procedure.  For the next 24 hours, DO NOT: -Drive a car -Paediatric nurse -Drink alcoholic beverages -Take any medication unless instructed by your physician -Make any legal decisions or sign important papers.  Meals: Start with liquid foods  such as gelatin or soup. Progress to regular foods as tolerated. Avoid greasy, spicy, heavy foods. If nausea and/or vomiting occur, drink only clear liquids until the nausea and/or vomiting subsides. Call your physician if vomiting continues.  Special Instructions/Symptoms: Your throat may feel dry or sore from the anesthesia or the breathing tube placed in your throat during surgery. If this causes discomfort, gargle with warm salt water. The discomfort should disappear within 24 hours.

## 2022-12-10 NOTE — Anesthesia Postprocedure Evaluation (Signed)
Anesthesia Post Note  Patient: Kevin [name]  Procedure(s) Performed: CYSTOSCOPY WITH LITHOLAPAXY and FULGURATION (Bladder) HOLMIUM LASER APPLICATION (Bladder)     Patient location during evaluation: PACU Anesthesia Type: General Level of consciousness: awake and alert Pain management: pain level controlled Vital Signs Assessment: post-procedure vital signs reviewed and stable Respiratory status: spontaneous breathing, nonlabored ventilation, respiratory function stable and patient connected to nasal cannula oxygen Cardiovascular status: blood pressure returned to baseline and stable Postop Assessment: no apparent nausea or vomiting Anesthetic complications: no   No notable events documented.  Last Vitals:  Vitals:   12/10/22 1100 12/10/22 1126  BP: 129/62 123/70  Pulse: 63 62  Resp: 20 13  Temp: (!) 36.3 C (!) 36.3 C  SpO2: 100% 98%    Last Pain:  Vitals:   12/10/22 1126  TempSrc:   PainSc: 0-No pain                 Grover Woodfield S

## 2022-12-10 NOTE — Anesthesia Procedure Notes (Signed)
Procedure Name: LMA Insertion Date/Time: 12/10/2022 9:44 AM  Performed by: Dairl Ponder, CRNAPre-anesthesia Checklist: Patient identified, Emergency Drugs available, Suction available and Patient being monitored Patient Re-evaluated:Patient Re-evaluated prior to induction Oxygen Delivery Method: Circle System Utilized Preoxygenation: Pre-oxygenation with 100% oxygen Induction Type: IV induction Ventilation: Mask ventilation without difficulty LMA: LMA inserted LMA Size: 4.0 Number of attempts: 1 Airway Equipment and Method: Bite block Placement Confirmation: positive ETCO2 Tube secured with: Tape Dental Injury: Teeth and Oropharynx as per pre-operative assessment

## 2022-12-15 ENCOUNTER — Encounter (HOSPITAL_BASED_OUTPATIENT_CLINIC_OR_DEPARTMENT_OTHER): Payer: Self-pay | Admitting: Urology

## 2022-12-28 ENCOUNTER — Other Ambulatory Visit: Payer: Self-pay | Admitting: Urology

## 2022-12-28 DIAGNOSIS — Z8551 Personal history of malignant neoplasm of bladder: Secondary | ICD-10-CM | POA: Insufficient documentation

## 2022-12-28 DIAGNOSIS — N401 Enlarged prostate with lower urinary tract symptoms: Secondary | ICD-10-CM | POA: Insufficient documentation

## 2022-12-28 DIAGNOSIS — N210 Calculus in bladder: Secondary | ICD-10-CM

## 2022-12-28 DIAGNOSIS — Z87448 Personal history of other diseases of urinary system: Secondary | ICD-10-CM | POA: Insufficient documentation

## 2022-12-28 NOTE — Progress Notes (Deleted)
Name: Kevin [name] DOB: 1941-01-15 MRN: [id number]  Diagnoses: 1) Post-operative state  HPI: Kevin [name] presents post-operatively.  - GU history: 1. Bladder cancer (low-grade non-muscle invasive). - 2017: Initial resection.  - 05/19/2018: Repeat TURBT/Gemcitabine by Dr. Retta Diones. 2. BPH with LUTS (nocturia).  - 08/29/2017: Underwent UroLift procedure. - ***Taking Flomax 0.4 mg ***.   Recent history:  12/10/2022: Underwent cystolitholapaxy of 12 mm bladder calculus with fulguration of bladder neck by Dr. Retta Diones. Stone thought to be related to a capsular tab from his UroLift.  Postop course: KUB today: Awaiting radiology read; ***  Today He reports***  Pt reports urinating *** per day. Pt reports *** urinary stream. Pt {Actions; denies-reports:120008} urgency. Pt {Actions; denies-reports:120008} dysuria. Pt {Actions; denies-reports:120008}  gross hematuria. Pt {Actions; denies-reports:120008}  the need to strain to void. Pt {Actions; denies-reports:120008}  sensations of incomplete emptying.  Pt {Actions; denies-reports:120008}  flank pain. Pt {Actions; denies-reports:120008}  abdominal pain.   Fall Screening: Do you usually have a device to assist in your mobility? {yes/no:20286} ***cane / ***walker / ***wheelchair   Medications: Current Outpatient Medications  Medication Sig Dispense Refill   acetaminophen (TYLENOL) 500 MG tablet Take 500 mg by mouth daily as needed for moderate pain, fever or headache.     aspirin EC 81 MG tablet Take 81 mg by mouth daily.     Calcium Carbonate-Vit D-Min (CALCIUM 1200 PO) Take by mouth.     Cholecalciferol (D3 5000 PO) Take by mouth.     metoprolol tartrate (LOPRESSOR) 25 MG tablet TAKE 1 TABLET BY MOUTH TWICE A DAY (Patient taking differently: Take 12.5 mg by mouth 2 (two) times daily.) 180 tablet 3   nitroGLYCERIN (NITROSTAT) 0.4 MG SL tablet Place 0.4 mg under the tongue every 5 (five) minutes as needed for chest pain.      rosuvastatin (CRESTOR) 20 MG tablet Take 1 tablet (20 mg total) by mouth daily. 90 tablet 3   No current facility-administered medications for this visit.    Allergies: Allergies  Allergen Reactions   Atorvastatin Cough    Past Medical History:  Diagnosis Date   Coronary artery disease    CABG x 3 on 05/13/21, Follows w/ [facility].   History of immunotherapy    Imfinzi 1500 mg every 4 weeks, began 10/27/21.   Hx of bladder cancer    s/p multiple TURBT, follows w/ Dr. Retta Diones   Hyperlipidemia    Lung cancer (HCC)    RUL lung mass, s/p right uppler lobe wedge resection, Stage IIIc, non-small cell lung cancer, adenocarcinoma, Follows with [facility].   SCC (squamous cell carcinoma) 05/28/2004   top of left hand curet x3 21fu   SCC (squamous cell carcinoma) 09/13/2013   mid back tx cx3 41fu   SCC (squamous cell carcinoma) 09/06/2018   left wrist tx with bx   Squamous cell carcinoma of skin 11/16/2003   left ear tip tx curet and cautery 72fu   Status post chemoradiation    Concurrent chemoradiation with weekly carboplatin for AUC of 2 and paclitaxel 45 mg/M2. First dose 08/11/2021. Status post 6 cycles. Last dose 09/22/21.   Past Surgical History:  Procedure Laterality Date   BRONCHIAL BIOPSY  07/22/2021   Procedure: BRONCHIAL BIOPSIES;  Surgeon: Leslye Peer, MD;  Location: [facility] ENDOSCOPY;  Service: Cardiopulmonary;;   BRONCHIAL BRUSHINGS  07/22/2021   Procedure: BRONCHIAL BRUSHINGS;  Surgeon: Leslye Peer, MD;  Location: [facility] ENDOSCOPY;  Service: Cardiopulmonary;;   BRONCHIAL NEEDLE ASPIRATION BIOPSY  07/22/2021   Procedure: BRONCHIAL NEEDLE ASPIRATION BIOPSIES;  Surgeon: Leslye Peer, MD;  Location: [facility] ENDOSCOPY;  Service: Cardiopulmonary;;   CHOLECYSTECTOMY N/A 02/15/2022   Procedure: LAPAROSCOPIC CHOLECYSTECTOMY;  Surgeon: Lucretia Roers, MD;  Location: [facility] ORS;  Service: General;  Laterality: N/A;   CORONARY ARTERY BYPASS GRAFT N/A 05/13/2021    Procedure: CORONARY ARTERY BYPASS GRAFTING (CABG) TIMES 3  , ON PUMP, USING LEFT INTERNAL MAMMARY ARTERY AND RIGHT GREATER SAPHENOUS VEIN HARVESTED ENDOSCOPICALLY;  Surgeon: Corliss Skains, MD;  Location: [facility] OR;  Service: Open Heart Surgery;  Laterality: N/A;   CYSTOSCOPY WITH LITHOLAPAXY N/A 12/10/2022   Procedure: CYSTOSCOPY WITH LITHOLAPAXY and FULGURATION;  Surgeon: Marcine Matar, MD;  Location: [facility];  Service: Urology;  Laterality: N/A;   ENDOVEIN HARVEST OF GREATER SAPHENOUS VEIN Right 05/13/2021   Procedure: ENDOVEIN HARVEST OF GREATER SAPHENOUS VEIN;  Surgeon: Corliss Skains, MD;  Location: [facility] OR;  Service: Open Heart Surgery;  Laterality: Right;   epidural     back pain   HOLMIUM LASER APPLICATION N/A 12/10/2022   Procedure: HOLMIUM LASER APPLICATION;  Surgeon: Marcine Matar, MD;  Location: [facility];  Service: Urology;  Laterality: N/A;   IR CT SPINE LTD  06/18/2020   IR KYPHO LUMBAR INC FX REDUCE BONE BX UNI/BIL CANNULATION INC/IMAGING  06/18/2020   LEFT HEART CATH AND CORONARY ANGIOGRAPHY N/A 05/06/2021   Procedure: LEFT HEART CATH AND CORONARY ANGIOGRAPHY;  Surgeon: Yates Decamp, MD;  Location: [facility] INVASIVE CV LAB;  Service: Cardiovascular;  Laterality: N/A;   LUNG BIOPSY Right 05/13/2021   Procedure: RIGHT LUNG BIOPSY;  Surgeon: Corliss Skains, MD;  Location: [facility] OR;  Service: Open Heart Surgery;  Laterality: Right;   MINOR HEMORRHOIDECTOMY     SHOULDER SURGERY     TEE WITHOUT CARDIOVERSION N/A 05/13/2021   Procedure: TRANSESOPHAGEAL ECHOCARDIOGRAM (TEE);  Surgeon: Corliss Skains, MD;  Location: [facility] OR;  Service: Open Heart Surgery;  Laterality: N/A;   TRANSURETHRAL RESECTION OF BLADDER TUMOR  2014   TRANSURETHRAL RESECTION OF BLADDER TUMOR N/A 05/19/2018   Procedure: TRANSURETHRAL RESECTION OF BLADDER TUMOR (TURBT) AND (INTRAVESICAL GEMCITABINE INSTILLED IN BLADDER IN PACU) ;  Surgeon: Marcine Matar, MD;  Location: [facility]  ORS;  Service: Urology;  Laterality: N/A;  30 MINS   VIDEO BRONCHOSCOPY WITH ENDOBRONCHIAL ULTRASOUND Bilateral 07/22/2021   Procedure: VIDEO BRONCHOSCOPY WITH ENDOBRONCHIAL ULTRASOUND;  Surgeon: Leslye Peer, MD;  Location: [facility] ENDOSCOPY;  Service: Cardiopulmonary;  Laterality: Bilateral;   Family History  Problem Relation Age of Onset   Alzheimer's disease Mother 1   Congestive Heart Failure Father 24   Heart disease Brother 40   Heart failure Brother 58   Heart failure Brother 72   Social History   Socioeconomic History   Marital status: Married    Spouse name: Not on file   Number of children: 2   Years of education: Not on file   Highest education level: Not on file  Occupational History   Not on file  Tobacco Use   Smoking status: Former    Current packs/day: 0.00    Average packs/day: 1 pack/day for 30.0 years (30.0 ttl pk-yrs)    Types: Cigarettes    Start date: 04/13/1965    Quit date: 04/14/1995    Years since quitting: 27.7   Smokeless tobacco: Never  Vaping Use   Vaping status: Never Used  Substance and Sexual Activity   Alcohol use: Yes    Comment: rarely  Drug use: Never   Sexual activity: Not on file  Other Topics Concern   Not on file  Social History Narrative   Not on file   Social Determinants of Health   Financial Resource Strain: Not on file  Food Insecurity: No Food Insecurity (02/14/2022)   Hunger Vital Sign    Worried About Running Out of Food in the Last Year: Never true    Ran Out of Food in the Last Year: Never true  Transportation Needs: No Transportation Needs (02/14/2022)   PRAPARE - Administrator, Civil Service (Medical): No    Lack of Transportation (Non-Medical): No  Physical Activity: Not on file  Stress: Not on file  Social Connections: Not on file  Intimate Partner Violence: Not At Risk (02/14/2022)   Humiliation, Afraid, Rape, and Kick questionnaire    Fear of Current or Ex-Partner: No    Emotionally Abused: No     Physically Abused: No    Sexually Abused: No    SUBJECTIVE  Review of Systems Constitutional: Patient ***denies any unintentional weight loss or change in strength lntegumentary: Patient ***denies any rashes or pruritus Eyes: Patient denies ***dry eyes ENT: Patient ***denies dry mouth Cardiovascular: Patient ***denies chest pain or syncope Respiratory: Patient ***denies shortness of breath Gastrointestinal: Patient ***denies nausea, vomiting, constipation, or diarrhea Musculoskeletal: Patient ***denies muscle cramps or weakness Neurologic: Patient ***denies convulsions or seizures Psychiatric: Patient ***denies memory problems Allergic/Immunologic: Patient ***denies recent allergic reaction(s) Hematologic/Lymphatic: Patient denies bleeding tendencies Endocrine: Patient ***denies heat/cold intolerance  GU: As per HPI.  OBJECTIVE There were no vitals filed for this visit. There is no height or weight on file to calculate BMI.  Physical Examination Constitutional: ***No obvious distress; patient is ***non-toxic appearing  Cardiovascular: ***No visible lower extremity edema.  Respiratory: The patient does ***not have audible wheezing/stridor; respirations do ***not appear labored  Gastrointestinal: Abdomen ***non-distended Musculoskeletal: ***Normal ROM of UEs  Skin: ***No obvious rashes/open sores  Neurologic: CN 2-12 grossly ***intact Psychiatric: Answered questions ***appropriately with ***normal affect  Hematologic/Lymphatic/Immunologic: ***No obvious bruises or sites of spontaneous bleeding  UA: ***negative / *** WBC/hpf, *** RBC/hpf, bacteria (***) PVR: *** ml  ASSESSMENT No diagnosis found.  We reviewed the operative procedures and findings.  He is doing ***well. Pre-operative symptoms are *** since the procedure. We reviewed recent imaging results; ***awaiting radiology results, appears to have ***no acute findings.  ***For stone prevention: Advised adequate  hydration and we discussed option to consider low oxalate diet given that calcium oxalate is the most common type of stone. Handout provided about stone prevention diet.  ***For recurrent stone formers: We discussed option to proceed with 24 hour urinalysis ([facility]) for metabolic evaluation, which may help with targeted recommendations for dietary I medication therapies for stone prevention. Patient elected to ***proceed/ ***hold off.  Will plan to follow up in ***6 months / ***1 year with KUB ***RUS for stone surveillance or sooner if needed.  Pt verbalized understanding and agreement. All questions were answered.  PLAN Advised the following: ***Maintain adequate fluid intake. ***Low oxalate diet. No follow-ups on file.  *** Billing: No global period for ureteroscopic stone manipulation; charge by regular E&M***  No orders of the defined types were placed in this encounter.   It has been explained that the patient is to follow regularly with their PCP in addition to all other providers involved in their care and to follow instructions provided by these respective offices. Patient advised to contact urology clinic if any urologic-pertaining  questions, concerns, new symptoms or problems arise in the interim period.  There are no Patient Instructions on file for this visit.  Electronically signed by:  Donnita Falls, MSN, FNP-C, CUNP 12/28/2022 10:29 AM

## 2022-12-29 ENCOUNTER — Encounter: Payer: Medicare HMO | Admitting: Urology

## 2022-12-29 ENCOUNTER — Ambulatory Visit (HOSPITAL_COMMUNITY)
Admission: RE | Admit: 2022-12-29 | Discharge: 2022-12-29 | Disposition: A | Discharge status: Home / Self Care | Payer: Medicare HMO | Source: Ambulatory Visit | Attending: Urology | Admitting: Urology

## 2022-12-29 ENCOUNTER — Encounter: Payer: Self-pay | Admitting: Urology

## 2022-12-29 ENCOUNTER — Ambulatory Visit (INDEPENDENT_AMBULATORY_CARE_PROVIDER_SITE_OTHER): Payer: Medicare HMO | Admitting: Urology

## 2022-12-29 VITALS — BP 120/70 | HR 72 | Temp 97.5°F

## 2022-12-29 DIAGNOSIS — N210 Calculus in bladder: Secondary | ICD-10-CM | POA: Insufficient documentation

## 2022-12-29 DIAGNOSIS — Z09 Encounter for follow-up examination after completed treatment for conditions other than malignant neoplasm: Secondary | ICD-10-CM

## 2022-12-29 DIAGNOSIS — Z87448 Personal history of other diseases of urinary system: Secondary | ICD-10-CM

## 2022-12-29 DIAGNOSIS — N529 Male erectile dysfunction, unspecified: Secondary | ICD-10-CM | POA: Insufficient documentation

## 2022-12-29 DIAGNOSIS — Z87442 Personal history of urinary calculi: Secondary | ICD-10-CM

## 2022-12-29 DIAGNOSIS — N401 Enlarged prostate with lower urinary tract symptoms: Secondary | ICD-10-CM

## 2022-12-29 DIAGNOSIS — Z8551 Personal history of malignant neoplasm of bladder: Secondary | ICD-10-CM | POA: Diagnosis not present

## 2022-12-29 DIAGNOSIS — N200 Calculus of kidney: Secondary | ICD-10-CM | POA: Diagnosis not present

## 2022-12-29 DIAGNOSIS — N138 Other obstructive and reflux uropathy: Secondary | ICD-10-CM

## 2022-12-29 LAB — URINALYSIS, ROUTINE W REFLEX MICROSCOPIC
Bilirubin, UA: NEGATIVE
Glucose, UA: NEGATIVE
Ketones, UA: NEGATIVE
Nitrite, UA: NEGATIVE
Specific Gravity, UA: 1.025 (ref 1.005–1.030)
Urobilinogen, Ur: 1 mg/dL (ref 0.2–1.0)
pH, UA: 6 (ref 5.0–7.5)

## 2022-12-29 LAB — MICROSCOPIC EXAMINATION: Bacteria, UA: NONE SEEN

## 2022-12-29 NOTE — Progress Notes (Signed)
Name: Kevin [name] DOB: June 23, 1940 MRN: [id number]  Diagnoses: 1) Post-operative state  HPI: Kevin [name] presents post-operatively.  - GU history: 1. Bladder cancer (low-grade non-muscle invasive).  - 2017: Initial resection.  - 05/19/2018: Repeat TURBT/Gemcitabine by Dr. Retta Diones. 2. BPH with LUTS (nocturia).  - 08/29/2017: Underwent UroLift procedure. - Previously took Flomax.  Recent history:  12/10/2022: Underwent cystolitholapaxy of 12 mm bladder calculus with fulguration of bladder neck by Dr. Retta Diones. Stone thought to be related to a capsular tab from his UroLift.  Postop course: KUB today: Awaiting radiology read; no stone fragments appreciated in bladder.  Today He reports feeling well with no acute concerns. He denies increased urinary urgency, frequency, nocturia, dysuria, gross hematuria, hesitancy, straining to void, or sensations of incomplete emptying. Denies fevers, flank pain, or abdominal pain.  He reports bother related to erectile dysfunction. He states he is able to get an erection which is not as strong as it used to be.   He denies  prior use of PDE-5 inhibitors (Viagra, Cialis, Levitra). He has a prescription for Nitroglycerin PRN due to history of CAD s/p CABG on 05/13/2021; followed by cardiology (Dr. Jacinto Halim).   He denies history of hypogonadism / low testosterone.  He denies fatigue, decreased sexual desire/ libido, decreased muscle mass, weakness, mood changes.  Fall Screening: Do you usually have a device to assist in your mobility? No   Medications: Current Outpatient Medications  Medication Sig Dispense Refill   acetaminophen (TYLENOL) 500 MG tablet Take 500 mg by mouth daily as needed for moderate pain, fever or headache.     aspirin EC 81 MG tablet Take 81 mg by mouth daily.     Calcium Carbonate-Vit D-Min (CALCIUM 1200 PO) Take by mouth.     Cholecalciferol (D3 5000 PO) Take by mouth.     metoprolol tartrate (LOPRESSOR) 25 MG tablet TAKE  1 TABLET BY MOUTH TWICE A DAY (Patient taking differently: Take 12.5 mg by mouth 2 (two) times daily.) 180 tablet 3   nitroGLYCERIN (NITROSTAT) 0.4 MG SL tablet Place 0.4 mg under the tongue every 5 (five) minutes as needed for chest pain.     rosuvastatin (CRESTOR) 20 MG tablet Take 1 tablet (20 mg total) by mouth daily. 90 tablet 3   No current facility-administered medications for this visit.    Allergies: Allergies  Allergen Reactions   Atorvastatin Cough    Past Medical History:  Diagnosis Date   Coronary artery disease    CABG x 3 on 05/13/21, Follows w/ [facility].   History of immunotherapy    Imfinzi 1500 mg every 4 weeks, began 10/27/21.   Hx of bladder cancer    s/p multiple TURBT, follows w/ Dr. Retta Diones   Hyperlipidemia    Lung cancer (HCC)    RUL lung mass, s/p right uppler lobe wedge resection, Stage IIIc, non-small cell lung cancer, adenocarcinoma, [facility] with [facility].   SCC (squamous cell carcinoma) 05/28/2004   top of left hand curet x3 31fu   SCC (squamous cell carcinoma) 09/13/2013   mid back tx cx3 66fu   SCC (squamous cell carcinoma) 09/06/2018   left wrist tx with bx   Squamous cell carcinoma of skin 11/16/2003   left ear tip tx curet and cautery 32fu   Status post chemoradiation    Concurrent chemoradiation with weekly carboplatin for AUC of 2 and paclitaxel 45 mg/M2. First dose 08/11/2021. Status post 6 cycles. Last dose 09/22/21.   Past Surgical History:  Procedure Laterality Date   BRONCHIAL BIOPSY  07/22/2021   Procedure: BRONCHIAL BIOPSIES;  Surgeon: Leslye Peer, MD;  Location: [facility] ENDOSCOPY;  Service: Cardiopulmonary;;   BRONCHIAL BRUSHINGS  07/22/2021   Procedure: BRONCHIAL BRUSHINGS;  Surgeon: Leslye Peer, MD;  Location: [facility] ENDOSCOPY;  Service: Cardiopulmonary;;   BRONCHIAL NEEDLE ASPIRATION BIOPSY  07/22/2021   Procedure: BRONCHIAL NEEDLE ASPIRATION BIOPSIES;  Surgeon: Leslye Peer, MD;  Location: [facility] ENDOSCOPY;   Service: Cardiopulmonary;;   CHOLECYSTECTOMY N/A 02/15/2022   Procedure: LAPAROSCOPIC CHOLECYSTECTOMY;  Surgeon: Lucretia Roers, MD;  Location: [facility] ORS;  Service: General;  Laterality: N/A;   CORONARY ARTERY BYPASS GRAFT N/A 05/13/2021   Procedure: CORONARY ARTERY BYPASS GRAFTING (CABG) TIMES 3  , ON PUMP, USING LEFT INTERNAL MAMMARY ARTERY AND RIGHT GREATER SAPHENOUS VEIN HARVESTED ENDOSCOPICALLY;  Surgeon: Corliss Skains, MD;  Location: [facility] OR;  Service: Open Heart Surgery;  Laterality: N/A;   CYSTOSCOPY WITH LITHOLAPAXY N/A 12/10/2022   Procedure: CYSTOSCOPY WITH LITHOLAPAXY and FULGURATION;  Surgeon: Marcine Matar, MD;  Location: [facility];  Service: Urology;  Laterality: N/A;   ENDOVEIN HARVEST OF GREATER SAPHENOUS VEIN Right 05/13/2021   Procedure: ENDOVEIN HARVEST OF GREATER SAPHENOUS VEIN;  Surgeon: Corliss Skains, MD;  Location: [facility] OR;  Service: Open Heart Surgery;  Laterality: Right;   epidural     back pain   HOLMIUM LASER APPLICATION N/A 12/10/2022   Procedure: HOLMIUM LASER APPLICATION;  Surgeon: Marcine Matar, MD;  [facility];  Service: Urology;  Laterality: N/A;   IR CT SPINE LTD  06/18/2020   IR KYPHO LUMBAR INC FX REDUCE BONE BX UNI/BIL CANNULATION INC/IMAGING  06/18/2020   LEFT HEART CATH AND CORONARY ANGIOGRAPHY N/A 05/06/2021   Procedure: LEFT HEART CATH AND CORONARY ANGIOGRAPHY;  Surgeon: Yates Decamp, MD;  Location: [facility] INVASIVE CV LAB;  Service: Cardiovascular;  Laterality: N/A;   LUNG BIOPSY Right 05/13/2021   Procedure: RIGHT LUNG BIOPSY;  Surgeon: Corliss Skains, MD;  Location: [facility] OR;  Service: Open Heart Surgery;  Laterality: Right;   MINOR HEMORRHOIDECTOMY     SHOULDER SURGERY     TEE WITHOUT CARDIOVERSION N/A 05/13/2021   Procedure: TRANSESOPHAGEAL ECHOCARDIOGRAM (TEE);  Surgeon: Corliss Skains, MD;  Location: [facility] OR;  Service: Open Heart Surgery;  Laterality: N/A;   TRANSURETHRAL RESECTION OF BLADDER  TUMOR  2014   TRANSURETHRAL RESECTION OF BLADDER TUMOR N/A 05/19/2018   Procedure: TRANSURETHRAL RESECTION OF BLADDER TUMOR (TURBT) AND (INTRAVESICAL GEMCITABINE INSTILLED IN BLADDER IN PACU) ;  Surgeon: Marcine Matar, MD;  Location: [facility] ORS;  Service: Urology;  Laterality: N/A;  30 MINS   VIDEO BRONCHOSCOPY WITH ENDOBRONCHIAL ULTRASOUND Bilateral 07/22/2021   Procedure: VIDEO BRONCHOSCOPY WITH ENDOBRONCHIAL ULTRASOUND;  Surgeon: Leslye Peer, MD;  Location: [facility] ENDOSCOPY;  Service: Cardiopulmonary;  Laterality: Bilateral;   Family History  Problem Relation Age of Onset   Alzheimer's disease Mother 80   Congestive Heart Failure Father 36   Heart disease Brother 40   Heart failure Brother 62   Heart failure Brother 75   Social History   Socioeconomic History   Marital status: Married    Spouse name: Not on file   Number of children: 2   Years of education: Not on file   Highest education level: Not on file  Occupational History   Not on file  Tobacco Use   Smoking status: Former    Current packs/day: 0.00    Average packs/day: 1 pack/day for  30.0 years (30.0 ttl pk-yrs)    Types: Cigarettes    Start date: 04/13/1965    Quit date: 04/14/1995    Years since quitting: 27.7   Smokeless tobacco: Never  Vaping Use   Vaping status: Never Used  Substance and Sexual Activity   Alcohol use: Yes    Comment: rarely   Drug use: Never   Sexual activity: Not on file  Other Topics Concern   Not on file  Social History Narrative   Not on file   Social Determinants of Health   Financial Resource Strain: Not on file  Food Insecurity: No Food Insecurity (02/14/2022)   Hunger Vital Sign    Worried About Running Out of Food in the Last Year: Never true    Ran Out of Food in the Last Year: Never true  Transportation Needs: No Transportation Needs (02/14/2022)   PRAPARE - Administrator, Civil Service (Medical): No    Lack of Transportation (Non-Medical): No  Physical Activity:  Not on file  Stress: Not on file  Social Connections: Not on file  Intimate Partner Violence: Not At Risk (02/14/2022)   Humiliation, Afraid, Rape, and Kick questionnaire    Fear of Current or Ex-Partner: No    Emotionally Abused: No    Physically Abused: No    Sexually Abused: No    SUBJECTIVE  Review of Systems Constitutional: Patient denies any unintentional weight loss or change in strength lntegumentary: Patient denies any rashes or pruritus Cardiovascular: Patient denies chest pain or syncope Respiratory: Patient denies shortness of breath Gastrointestinal: Patient denies nausea, vomiting, constipation, or diarrhea Musculoskeletal: Patient denies muscle cramps or weakness Neurologic: Patient denies convulsions or seizures Psychiatric: Patient denies memory problems Allergic/Immunologic: Patient denies recent allergic reaction(s) Hematologic/Lymphatic: Patient denies bleeding tendencies Endocrine: Patient denies heat/cold intolerance  GU: As per HPI.  OBJECTIVE Vitals:   12/29/22 1057  BP: 120/70  Pulse: 72  Temp: (!) 97.5 F (36.4 C)   There is no height or weight on file to calculate BMI.  Physical Examination Constitutional: No obvious distress; patient is non-toxic appearing  Cardiovascular: No visible lower extremity edema.  Respiratory: The patient does not have audible wheezing/stridor; respirations do not appear labored  Gastrointestinal: Abdomen non-distended Musculoskeletal: Normal ROM of UEs  Skin: No obvious rashes/open sores  Neurologic: CN 2-12 grossly intact Psychiatric: Answered questions appropriately with normal affect  Hematologic/Lymphatic/Immunologic: No obvious bruises or sites of spontaneous bleeding  UA: 6-10 WBC/hpf, 3-10 RBC/hpf, bacteria (none) PVR: 0 ml  ASSESSMENT Bladder calculus - Plan: Urinalysis, Routine w reflex microscopic, DG Abd 1 View  History of bladder cancer - Plan: Urinalysis, Routine w reflex microscopic  Postop  check  BPH with urinary obstruction - Plan: Urinalysis, Routine w reflex microscopic, BLADDER SCAN AMB NON-IMAGING  Erectile dysfunction, unspecified erectile dysfunction type  We reviewed the operative procedures and findings.  He is doing well postop. We reviewed recent imaging results; awaiting radiology results, appears to have no acute findings.  For erectile dysfunction we discussed option to trial PDE-5 inhibitor oral medication, however patient was advised that cardiac clearance would be required first. Will reach out to his cardiologist's office and notify patient of their recommendations. If cardiology approves, will send in prescription for Viagra or Cialis for PRN use. We discussed potential side effects and he was advised to seek urgent medical attention if he develops an erection lasting more than 2-4 hours due to the risk for irreversible permanent tissue damage. He would need  to be sure not to take ED medication within a close time period of taking nitroglycerin.   Will plan to follow up with Dr. Retta Diones in 6 months with KUB for stone surveillance or sooner if needed. Pt verbalized understanding and agreement. All questions were answered.  PLAN Advised the following: 1. Consult note sent to cardiology (Dr. Jacinto Halim) re: possible PDE-5 inhibitor trial for erectile dysfunction 2. Return in 6 months (on 06/28/2023) for KUB, UA, PVR. & f/u with Dr. Retta Diones.   Orders Placed This Encounter  Procedures   DG Abd 1 View    Standing Status:   Future    Standing Expiration Date:   12/29/2023    Order Specific Question:   Reason for Exam (SYMPTOM  OR DIAGNOSIS REQUIRED)    Answer:   kidney stone    Order Specific Question:   Preferred imaging location?    Answer:   [facility]   Urinalysis, Routine w reflex microscopic   BLADDER SCAN AMB NON-IMAGING   It has been explained that the patient is to follow regularly with their PCP in addition to all other providers involved in  their care and to follow instructions provided by these respective offices. Patient advised to contact urology clinic if any urologic-pertaining questions, concerns, new symptoms or problems arise in the interim period.  There are no Patient Instructions on file for this visit.  Electronically signed by:  Donnita Falls, MSN, FNP-C, CUNP 12/29/2022 11:30 AM

## 2022-12-29 NOTE — Progress Notes (Signed)
post void residual=0 ?

## 2022-12-30 ENCOUNTER — Telehealth: Payer: Self-pay

## 2022-12-30 ENCOUNTER — Other Ambulatory Visit: Payer: Self-pay | Admitting: Urology

## 2022-12-30 DIAGNOSIS — Z299 Encounter for prophylactic measures, unspecified: Secondary | ICD-10-CM | POA: Diagnosis not present

## 2022-12-30 DIAGNOSIS — D696 Thrombocytopenia, unspecified: Secondary | ICD-10-CM | POA: Diagnosis not present

## 2022-12-30 DIAGNOSIS — C3411 Malignant neoplasm of upper lobe, right bronchus or lung: Secondary | ICD-10-CM | POA: Diagnosis not present

## 2022-12-30 DIAGNOSIS — I10 Essential (primary) hypertension: Secondary | ICD-10-CM | POA: Diagnosis not present

## 2022-12-30 DIAGNOSIS — N529 Male erectile dysfunction, unspecified: Secondary | ICD-10-CM

## 2022-12-30 MED ORDER — TADALAFIL 20 MG PO TABS
ORAL_TABLET | ORAL | 1 refills | Status: AC
Start: 2022-12-30 — End: ?

## 2022-12-30 NOTE — Telephone Encounter (Signed)
Tried calling patient with no answer, left vm for return call 

## 2022-12-30 NOTE — Progress Notes (Signed)
Received clearance from cardiologist (Dr. Jacinto Halim) to trial PDE-5 inhibitor for erectile dysfunction. Prescription sent for Cialis 20 mg PRN. Patient was advised of possible side effects at yesterday's office visit and will be instructed not to take Cialis anywhere near the same time as taking his PRN nitroglycerin due to cardiovascular risks such as hypotension / syncope with concurrent use of those medications. Advised follow up in 8 weeks for symptom recheck.  Evette Georges, MSN, FNP-C, [facility] Urology Nurse Practitioner [facility] Urology [place]

## 2022-12-30 NOTE — Telephone Encounter (Signed)
-----   Message from Donnita Falls sent at 12/30/2022  8:34 AM EDT ----- Regarding: FW: PDE5 inhibitor Rx Please let pt know that his cardiologist (Dr. Jacinto Halim) said it's okay for him to trial an oral medication (PDE-5 inhibitor) for erectile dysfunction. I am sending in a today prescription for Cialis 20 mg PRN. Please instruct patient not to take Cialis anywhere near the same time as taking his PRN nitroglycerin due to cardiovascular risks such as hypotension / syncope with concurrent use of those medications. Due to new medication, please advise patient to follow up in 8 weeks for symptom recheck (in addition to keeping scheduled appointment with Dr. Retta Diones on 06/29/2022). ----- Message ----- From: Yates Decamp, MD Sent: 12/29/2022   5:45 PM EDT To: Donnita Falls, FNP Subject: PDE5 inhibitor Rx                              I am fine with this ----- Message ----- From: Donnita Falls, FNP Sent: 12/29/2022  11:30 AM EDT To: Yates Decamp, MD  Dr. Jacinto Halim,  Mr. Riss is a shared patient of ours. He was seen in Urology clinic today and reported erectile dysfunction. We would like to know if he is safe from a cardiac standpoint to trial a PDE-5 inhibitor such as Viagra or Cialis PRN for ED. Please advise.   Thank you, Evette Georges, MSN, FNP-C,  [facility] Urology Nurse Practitioner [facility] Urology [place]

## 2023-01-05 NOTE — Telephone Encounter (Signed)
Tried calling patient with no answer, left vm for return call. Letter mailed out.

## 2023-01-05 NOTE — Telephone Encounter (Signed)
-----  Message from Donnita Falls sent at 12/30/2022  8:34 AM EDT ----- Regarding: FW: PDE5 inhibitor Rx Please let pt know that his cardiologist (Dr. Jacinto Halim) said it's okay for him to trial an oral medication (PDE-5 inhibitor) for erectile dysfunction. I am sending in a today prescription for Cialis 20 mg PRN. Please instruct patient not to take Cialis anywhere near the same time as taking his PRN nitroglycerin due to cardiovascular risks such as hypotension / syncope with concurrent use of those medications. Due to new medication, please advise patient to follow up in 8 weeks for symptom recheck (in addition to keeping scheduled appointment with Dr. Retta Diones on 06/29/2022). ----- Message ----- From: Yates Decamp, MD Sent: 12/29/2022   5:45 PM EDT To: Donnita Falls, FNP Subject: PDE5 inhibitor Rx                              I am fine with this ----- Message ----- From: Donnita Falls, FNP Sent: 12/29/2022  11:30 AM EDT To: Yates Decamp, MD  Dr. Jacinto Halim,  Mr. Paxton is a shared patient of ours. He was seen in Urology clinic today and reported erectile dysfunction. We would like to know if he is safe from a cardiac standpoint to trial a PDE-5 inhibitor such as Viagra or Cialis PRN for ED. Please advise.   Thank you, Evette Georges, MSN, FNP-C, [facility] Urology Nurse Practitioner [facility] Urology [place]

## 2023-01-14 ENCOUNTER — Telehealth: Payer: Self-pay

## 2023-01-14 DIAGNOSIS — Z23 Encounter for immunization: Secondary | ICD-10-CM | POA: Diagnosis not present

## 2023-01-14 NOTE — Telephone Encounter (Signed)
Patient is aware of Sarah's response to KUB.  Pt will follow up as scheduled with another KUB

## 2023-01-14 NOTE — Telephone Encounter (Signed)
-----   Message from Kevin [name] sent at 01/14/2023 10:18 AM EDT ----- Please let pt know that KUB showed a small right kidney stone; previous bladder stone not seen. Advised to follow up as planned.

## 2023-01-18 ENCOUNTER — Telehealth: Payer: Self-pay | Admitting: Cardiology

## 2023-01-18 DIAGNOSIS — I2510 Atherosclerotic heart disease of native coronary artery without angina pectoris: Secondary | ICD-10-CM

## 2023-01-18 DIAGNOSIS — E7800 Pure hypercholesterolemia, unspecified: Secondary | ICD-10-CM

## 2023-01-18 MED ORDER — ROSUVASTATIN CALCIUM 20 MG PO TABS: 20.0000 mg | ORAL_TABLET | Freq: Every day | ORAL | 1 refills | Status: DC

## 2023-01-18 NOTE — Telephone Encounter (Signed)
*  STAT* If patient is at the pharmacy, call can be transferred to refill team.   1. Which medications need to be refilled? (please list name of each medication and dose if known)   rosuvastatin (CRESTOR) 20 MG tablet (Expired)    2. Which pharmacy/location (including street and city if local pharmacy) is medication to be sent to?  [id number] - [place], [place] - [address]      3. Do they need a 30 day or 90 day supply? 90 day    Pt is out of medication

## 2023-01-18 NOTE — Telephone Encounter (Signed)
Pt's medication was sent to pt's pharmacy as requested. Confirmation received.  °

## 2023-02-23 ENCOUNTER — Ambulatory Visit (HOSPITAL_COMMUNITY)
Admission: RE | Admit: 2023-02-23 | Discharge: 2023-02-23 | Disposition: A | Discharge status: Home / Self Care | Payer: Medicare HMO | Source: Ambulatory Visit | Attending: Internal Medicine | Admitting: Internal Medicine

## 2023-02-23 ENCOUNTER — Encounter (HOSPITAL_COMMUNITY): Payer: Self-pay

## 2023-02-23 ENCOUNTER — Inpatient Hospital Stay: Payer: Medicare HMO | Attending: Internal Medicine

## 2023-02-23 DIAGNOSIS — Z9221 Personal history of antineoplastic chemotherapy: Secondary | ICD-10-CM | POA: Insufficient documentation

## 2023-02-23 DIAGNOSIS — Z923 Personal history of irradiation: Secondary | ICD-10-CM | POA: Insufficient documentation

## 2023-02-23 DIAGNOSIS — I700 Atherosclerosis of aorta: Secondary | ICD-10-CM | POA: Diagnosis not present

## 2023-02-23 DIAGNOSIS — C3490 Malignant neoplasm of unspecified part of unspecified bronchus or lung: Secondary | ICD-10-CM | POA: Insufficient documentation

## 2023-02-23 DIAGNOSIS — J432 Centrilobular emphysema: Secondary | ICD-10-CM | POA: Diagnosis not present

## 2023-02-23 DIAGNOSIS — Z902 Acquired absence of lung [part of]: Secondary | ICD-10-CM | POA: Insufficient documentation

## 2023-02-23 DIAGNOSIS — Z85118 Personal history of other malignant neoplasm of bronchus and lung: Secondary | ICD-10-CM | POA: Insufficient documentation

## 2023-02-23 LAB — CMP (CANCER CENTER ONLY)
ALT: 9 U/L (ref 0–44)
AST: 12 U/L — ABNORMAL LOW (ref 15–41)
Albumin: 4 g/dL (ref 3.5–5.0)
Alkaline Phosphatase: 66 U/L (ref 38–126)
Anion gap: 4 — ABNORMAL LOW (ref 5–15)
BUN: 17 mg/dL (ref 8–23)
CO2: 30 mmol/L (ref 22–32)
Calcium: 9.4 mg/dL (ref 8.9–10.3)
Chloride: 104 mmol/L (ref 98–111)
Creatinine: 1.43 mg/dL — ABNORMAL HIGH (ref 0.61–1.24)
GFR, Estimated: 50 mL/min — ABNORMAL LOW (ref >60)
Glucose, Bld: 95 mg/dL (ref 70–99)
Potassium: 4.5 mmol/L (ref 3.5–5.1)
Sodium: 138 mmol/L (ref 135–145)
Total Bilirubin: 1 mg/dL (ref <1.2)
Total Protein: 6.9 g/dL (ref 6.5–8.1)

## 2023-02-23 LAB — CBC WITH DIFFERENTIAL (CANCER CENTER ONLY)
Abs Immature Granulocytes: 0.01 10*3/uL (ref 0.00–0.07)
Basophils Absolute: 0 10*3/uL (ref 0.0–0.1)
Basophils Relative: 1 %
Eosinophils Absolute: 0.3 10*3/uL (ref 0.0–0.5)
Eosinophils Relative: 5 %
HCT: 37.7 % — ABNORMAL LOW (ref 39.0–52.0)
Hemoglobin: 13.1 g/dL (ref 13.0–17.0)
Immature Granulocytes: 0 %
Lymphocytes Relative: 19 %
Lymphs Abs: 1 10*3/uL (ref 0.7–4.0)
MCH: 32.3 pg (ref 26.0–34.0)
MCHC: 34.7 g/dL (ref 30.0–36.0)
MCV: 92.9 fL (ref 80.0–100.0)
Monocytes Absolute: 0.6 10*3/uL (ref 0.1–1.0)
Monocytes Relative: 11 %
Neutro Abs: 3.2 10*3/uL (ref 1.7–7.7)
Neutrophils Relative %: 64 %
Platelet Count: 163 10*3/uL (ref 150–400)
RBC: 4.06 MIL/uL — ABNORMAL LOW (ref 4.22–5.81)
RDW: 12.8 % (ref 11.5–15.5)
WBC Count: 5 10*3/uL (ref 4.0–10.5)
nRBC: 0 % (ref 0.0–0.2)

## 2023-02-23 MED ORDER — IOHEXOL 300 MG/ML  SOLN
75.0000 mL | Freq: Once | INTRAMUSCULAR | Status: AC | PRN
  Administered 2023-02-23: 75 mL via INTRAVENOUS

## 2023-03-02 ENCOUNTER — Inpatient Hospital Stay (HOSPITAL_BASED_OUTPATIENT_CLINIC_OR_DEPARTMENT_OTHER): Payer: Medicare HMO | Admitting: Internal Medicine

## 2023-03-02 VITALS — BP 130/71 | HR 74 | Temp 98.3°F | Resp 16 | Ht 70.0 in | Wt 169.0 lb

## 2023-03-02 DIAGNOSIS — Z9221 Personal history of antineoplastic chemotherapy: Secondary | ICD-10-CM | POA: Diagnosis not present

## 2023-03-02 DIAGNOSIS — C3490 Malignant neoplasm of unspecified part of unspecified bronchus or lung: Secondary | ICD-10-CM | POA: Diagnosis not present

## 2023-03-02 DIAGNOSIS — Z902 Acquired absence of lung [part of]: Secondary | ICD-10-CM | POA: Diagnosis not present

## 2023-03-02 DIAGNOSIS — Z923 Personal history of irradiation: Secondary | ICD-10-CM | POA: Diagnosis not present

## 2023-03-02 DIAGNOSIS — Z85118 Personal history of other malignant neoplasm of bronchus and lung: Secondary | ICD-10-CM | POA: Diagnosis not present

## 2023-03-02 NOTE — Progress Notes (Signed)
[facility] [phone]   [phone]  OFFICE PROGRESS NOTE  [name], MD [address] [place] [address]  DIAGNOSIS: Stage IIIC (T3, N3, M0) non-small cell lung cancer, adenocarcinoma presented with right upper lobe lung mass status post right upper lobe wedge resection but no lymph node dissection.  The tumor measured 3.3 cm with visceral pleural involvement and very close resection margin less than 0.1 cm from the tumor.  This was performed on May 13, 2021 in the same setting of CABG under the care of Dr. Cliffton Asters.  PET scan performed in March 2023 showed a right upper lobe nodule adjacent to the wedge resection site small hypermetabolic right hilar, right subcarinal, and left infrahilar lymph nodes which could be reactive but are suspicious for nodal involvement.   Molecular studies showed no actionable mutations and PD-L1 expression was 0%   PRIOR THERAPY:  1) Right upper lobe wedge resection under the care of Dr. Cliffton Asters on 05/13/2021. 2) Concurrent chemoradiation with weekly carboplatin for AUC of 2 and paclitaxel 45 Mg/M2.  First dose Aug 11, 2021.  Status post 6 cycles.  Last dose was given September 22, 2021. 3) Consolidation treatment with immunotherapy with Imfinzi 1500 Mg IV every 4 weeks.  First dose October 27, 2021.  Status post 13 cycles.   CURRENT THERAPY: Observation.  INTERVAL HISTORY: Kevin [name] 82 y.o. male returns to the clinic today for follow-up visit accompanied by his wife.Discussed the use of AI scribe software for clinical note transcription with the patient, who gave verbal consent to proceed.  History of Present Illness   Kevin [name], an 82 year old patient with a history of stage 3 non-small cell lung cancer, underwent a wedge resection of the right upper lobe in January 2023. Following the surgery, the patient received chemotherapy and radiation treatments, and completed a one-year course of immunotherapy with  Imfinzi, with the last dose administered in July 2024.  In the three months since the last consultation, the patient reports no health complaints. Specifically, he denies experiencing chest pain, breathing issues, nausea, vomiting, diarrhea, skin rash, or bruising. The patient is also on a blood thinner regimen.  The patient underwent a recent scan, the results of which showed no signs of locally recurrent tumor or metastatic disease within the chest. Concurrently, the patient's blood work results have remained stable.       MEDICAL HISTORY: Past Medical History:  Diagnosis Date   Coronary artery disease    CABG x 3 on 05/13/21, Follows w/ [facility].   History of immunotherapy    Imfinzi 1500 mg every 4 weeks, began 10/27/21.   Hx of bladder cancer    s/p multiple TURBT, follows w/ Dr. Retta Diones   Hyperlipidemia    Lung cancer (HCC)    RUL lung mass, s/p right uppler lobe wedge resection, Stage IIIc, non-small cell lung cancer, adenocarcinoma, Follows with [facility].   SCC (squamous cell carcinoma) 05/28/2004   top of left hand curet x3 52fu   SCC (squamous cell carcinoma) 09/13/2013   mid back tx cx3 32fu   SCC (squamous cell carcinoma) 09/06/2018   left wrist tx with bx   Squamous cell carcinoma of skin 11/16/2003   left ear tip tx curet and cautery 52fu   Status post chemoradiation    Concurrent chemoradiation with weekly carboplatin for AUC of 2 and paclitaxel 45 mg/M2. First dose 08/11/2021. Status post 6 cycles. Last dose 09/22/21.  ALLERGIES:  is allergic to atorvastatin.  MEDICATIONS:  Current Outpatient Medications  Medication Sig Dispense Refill   tadalafil (CIALIS) 20 MG tablet Take one 20 mg tablet at least 60 minutes prior to anticipated sexual activity to achieve erection. Do not use more than once per day. 20 tablet 1   acetaminophen (TYLENOL) 500 MG tablet Take 500 mg by mouth daily as needed for moderate pain, fever or headache.      aspirin EC 81 MG tablet Take 81 mg by mouth daily.     Calcium Carbonate-Vit D-Min (CALCIUM 1200 PO) Take by mouth.     Cholecalciferol (D3 5000 PO) Take by mouth.     metoprolol tartrate (LOPRESSOR) 25 MG tablet TAKE 1 TABLET BY MOUTH TWICE A DAY (Patient taking differently: Take 12.5 mg by mouth 2 (two) times daily.) 180 tablet 3   nitroGLYCERIN (NITROSTAT) 0.4 MG SL tablet Place 0.4 mg under the tongue every 5 (five) minutes as needed for chest pain.     rosuvastatin (CRESTOR) 20 MG tablet Take 1 tablet (20 mg total) by mouth daily. 90 tablet 1   No current facility-administered medications for this visit.    SURGICAL HISTORY:  Past Surgical History:  Procedure Laterality Date   BRONCHIAL BIOPSY  07/22/2021   Procedure: BRONCHIAL BIOPSIES;  Surgeon: Leslye Peer, MD;  Location: [facility] ENDOSCOPY;  Service: Cardiopulmonary;;   BRONCHIAL BRUSHINGS  07/22/2021   Procedure: BRONCHIAL BRUSHINGS;  Surgeon: Leslye Peer, MD;  Location: [facility] ENDOSCOPY;  Service: Cardiopulmonary;;   BRONCHIAL NEEDLE ASPIRATION BIOPSY  07/22/2021   Procedure: BRONCHIAL NEEDLE ASPIRATION BIOPSIES;  Surgeon: Leslye Peer, MD;  Location: [facility] ENDOSCOPY;  Service: Cardiopulmonary;;   CHOLECYSTECTOMY N/A 02/15/2022   Procedure: LAPAROSCOPIC CHOLECYSTECTOMY;  Surgeon: Lucretia Roers, MD;  Location: [facility] ORS;  Service: General;  Laterality: N/A;   CORONARY ARTERY BYPASS GRAFT N/A 05/13/2021   Procedure: CORONARY ARTERY BYPASS GRAFTING (CABG) TIMES 3  , ON PUMP, USING LEFT INTERNAL MAMMARY ARTERY AND RIGHT GREATER SAPHENOUS VEIN HARVESTED ENDOSCOPICALLY;  Surgeon: Corliss Skains, MD;  Location: [facility] OR;  Service: Open Heart Surgery;  Laterality: N/A;   CYSTOSCOPY WITH LITHOLAPAXY N/A 12/10/2022   Procedure: CYSTOSCOPY WITH LITHOLAPAXY and FULGURATION;  Surgeon: Marcine Matar, MD;  Location: [facility];  Service: Urology;  Laterality: N/A;   ENDOVEIN HARVEST OF GREATER SAPHENOUS VEIN Right 05/13/2021    Procedure: ENDOVEIN HARVEST OF GREATER SAPHENOUS VEIN;  Surgeon: Corliss Skains, MD;  Location: [facility] OR;  Service: Open Heart Surgery;  Laterality: Right;   epidural     back pain   HOLMIUM LASER APPLICATION N/A 12/10/2022   Procedure: HOLMIUM LASER APPLICATION;  Surgeon: Marcine Matar, MD;  Location: [facility];  Service: Urology;  Laterality: N/A;   IR CT SPINE LTD  06/18/2020   IR KYPHO LUMBAR INC FX REDUCE BONE BX UNI/BIL CANNULATION INC/IMAGING  06/18/2020   LEFT HEART CATH AND CORONARY ANGIOGRAPHY N/A 05/06/2021   Procedure: LEFT HEART CATH AND CORONARY ANGIOGRAPHY;  Surgeon: Yates Decamp, MD;  Location: [facility] INVASIVE CV LAB;  Service: Cardiovascular;  Laterality: N/A;   LUNG BIOPSY Right 05/13/2021   Procedure: RIGHT LUNG BIOPSY;  Surgeon: Corliss Skains, MD;  Location: [facility] OR;  Service: Open Heart Surgery;  Laterality: Right;   MINOR HEMORRHOIDECTOMY     SHOULDER SURGERY     TEE WITHOUT CARDIOVERSION N/A 05/13/2021   Procedure: TRANSESOPHAGEAL ECHOCARDIOGRAM (TEE);  Surgeon: Corliss Skains, MD;  Location: [facility] OR;  Service: Open Heart Surgery;  Laterality: N/A;   TRANSURETHRAL RESECTION OF BLADDER TUMOR  2014   TRANSURETHRAL RESECTION OF BLADDER TUMOR N/A 05/19/2018   Procedure: TRANSURETHRAL RESECTION OF BLADDER TUMOR (TURBT) AND (INTRAVESICAL GEMCITABINE INSTILLED IN BLADDER IN PACU) ;  Surgeon: Marcine Matar, MD;  Location: [facility] ORS;  Service: Urology;  Laterality: N/A;  30 MINS   VIDEO BRONCHOSCOPY WITH ENDOBRONCHIAL ULTRASOUND Bilateral 07/22/2021   Procedure: VIDEO BRONCHOSCOPY WITH ENDOBRONCHIAL ULTRASOUND;  Surgeon: Leslye Peer, MD;  Location: [facility] ENDOSCOPY;  Service: Cardiopulmonary;  Laterality: Bilateral;    REVIEW OF SYSTEMS:  A comprehensive review of systems was negative.   PHYSICAL EXAMINATION: General appearance: alert, cooperative, and no distress Head: Normocephalic, without obvious abnormality, atraumatic Neck: no adenopathy, no JVD,  supple, symmetrical, trachea midline, and thyroid not enlarged, symmetric, no tenderness/mass/nodules Lymph nodes: Cervical, supraclavicular, and axillary nodes normal. Resp: clear to auscultation bilaterally Back: symmetric, no curvature. ROM normal. No CVA tenderness. Cardio: regular rate and rhythm, S1, S2 normal, no murmur, click, rub or gallop GI: soft, non-tender; bowel sounds normal; no masses,  no organomegaly Extremities: extremities normal, atraumatic, no cyanosis or edema  ECOG PERFORMANCE STATUS: 0 - Asymptomatic  Blood pressure 130/71, pulse 74, temperature 98.3 F (36.8 C), temperature source Temporal, resp. rate 16, height 5\' 10"  (1.778 m), weight 169 lb (76.7 kg), SpO2 100%.  LABORATORY DATA: Lab Results  Component Value Date   WBC 5.0 02/23/2023   HGB 13.1 02/23/2023   HCT 37.7 (L) 02/23/2023   MCV 92.9 02/23/2023   PLT 163 02/23/2023      Chemistry      Component Value Date/Time   NA 138 02/23/2023 0851   NA 139 05/01/2021 1146   K 4.5 02/23/2023 0851   CL 104 02/23/2023 0851   CO2 30 02/23/2023 0851   BUN 17 02/23/2023 0851   BUN 20 05/01/2021 1146   CREATININE 1.43 (H) 02/23/2023 0851      Component Value Date/Time   CALCIUM 9.4 02/23/2023 0851   ALKPHOS 66 02/23/2023 0851   AST 12 (L) 02/23/2023 0851   ALT 9 02/23/2023 0851   BILITOT 1.0 02/23/2023 0851       RADIOGRAPHIC STUDIES: CT Chest W Contrast  Result Date: 03/02/2023 CLINICAL DATA:  Restaging non-small cell lung cancer. Status post chemotherapy and XRT. * Tracking Code: BO * EXAM: CT CHEST WITH CONTRAST TECHNIQUE: Multidetector CT imaging of the chest was performed during intravenous contrast administration. RADIATION DOSE REDUCTION: This exam was performed according to the departmental dose-optimization program which includes automated exposure control, adjustment of the mA and/or kV according to patient size and/or use of iterative reconstruction technique. CONTRAST:  75mL OMNIPAQUE  IOHEXOL 300 MG/ML  SOLN COMPARISON:  11/23/22 FINDINGS: Cardiovascular: The heart size is normal. Previous median sternotomy and CABG procedure. Aortic atherosclerotic calcifications. No pericardial effusion. Mediastinum/Nodes: Thyroid gland, trachea and esophagus appear normal. No enlarged mediastinal or hilar lymph nodes. Lungs/Pleura: Paraseptal and centrilobular emphysema. Bilateral paramediastinal radiation change is again noted with subpleural fibrosis and architectural distortion. Postoperative change with volume loss is noted within the right upper hemithorax. Here, there is masslike architectural distortion which appears unchanged in the interval. No signs of locally recurrent tumor or metastatic disease within the chest. Upper Abdomen: No acute abnormality. Small stone noted within upper pole of right kidney, unchanged. Status post cholecystectomy. Adrenal glands remain preserved. Musculoskeletal: No chest wall abnormality. No acute or significant osseous findings. IMPRESSION: 1. Stable post treatment changes within both lungs. No  signs of locally recurrent tumor or metastatic disease within the chest. 2. Aortic Atherosclerosis (ICD10-I70.0) and Emphysema (ICD10-J43.9). Electronically Signed   By: Signa Kell M.D.   On: 03/02/2023 08:01    ASSESSMENT AND PLAN: This is a very pleasant 82 years old white male diagnosed with stage IIIC (T3, N3, M0) non-small cell lung cancer, adenocarcinoma with no actionable mutations and PD-L1 expression of 0% diagnosed in January 2023 status post wedge resection of the right upper lobe lung mass in a setting of CABG under the care of Dr. Cliffton Asters but the PET scan showed residual disease in the right upper lobe in addition to hypermetabolic right hilar, subcarinal and left infrahilar lymph nodes. The patient was seen by Dr. Delton Coombes and biopsy of the right upper lobe as well as the subcarinal and left hilar lymph nodes were positive for malignancy consistent with  non-small cell lung cancer, adenocarcinoma. He underwent a course of concurrent chemoradiation with weekly carboplatin for AUC of 2 and paclitaxel 45 Mg/M2 status post 6 cycles.  The patient tolerated this previous course of concurrent chemoradiation fairly well except for fatigue and mild odynophagia. The patient underwent a course of consolidation treatment with immunotherapy with Imfinzi 1500 Mg IV every 4 weeks status post 13 cycles.    The patient is currently on observation and he is feeling fine with no concerning complaints. He had repeat CT scan of the chest performed recently.  I personally and independently reviewed the scan and discussed the result with the patient and his wife.  His scan showed no concerning findings for disease progression.    Stage III Non-Small Cell Lung Cancer (NSCLC)   Kevin [name], an [age]-year-old, was diagnosed with stage III NSCLC in January 2023. He underwent a wedge resection of the right upper lobe, followed by chemotherapy and radiation. He completed one year of immunotherapy with Imfinzi, with the last dose in July 2024. He reports no complaints, including chest pain, dyspnea, nausea, vomiting, diarrhea, skin rash, or bruising. Recent scans show no signs of recurrent or metastatic disease, and blood work is normal. He is satisfied with the treatment outcome and has no new symptoms.   - Continue routine follow-up every three months   - Monitor for new symptoms or changes in health status   - Maintain regular imaging and blood work per oncology protocol    General Health Maintenance   He is doing well overall with no new complaints and is engaging in normal activities.   - Encourage a balanced diet and regular physical activity   - Monitor general health and well-being    Follow-up   - Schedule next follow-up appointment in three months.   For the recently diagnosed bladder cancer, he is being evaluated by urology by Dr. Retta Diones. The patient was  advised to call immediately if he has any concerning symptoms in the interval. The patient voices understanding of current disease status and treatment options and is in agreement with the current care plan. All questions were answered. The patient knows to call the clinic with any problems, questions or concerns. We can certainly see the patient much sooner if necessary. The total time spent in the appointment was 20 minutes.   Disclaimer: This note was dictated with voice recognition software. Similar sounding words can inadvertently be transcribed and may not be corrected upon review.

## 2023-04-30 ENCOUNTER — Telehealth: Payer: Self-pay

## 2023-04-30 NOTE — Telephone Encounter (Signed)
Patient called and wants labs same day as CT scan.  Rescheduled lab appt before scan.  Patient aware.

## 2023-05-25 ENCOUNTER — Ambulatory Visit (HOSPITAL_COMMUNITY): Payer: Medicare HMO

## 2023-05-25 ENCOUNTER — Other Ambulatory Visit: Payer: Medicare HMO

## 2023-05-27 ENCOUNTER — Inpatient Hospital Stay: Payer: Medicare HMO | Attending: Internal Medicine

## 2023-05-27 ENCOUNTER — Ambulatory Visit (HOSPITAL_COMMUNITY)
Admission: RE | Admit: 2023-05-27 | Discharge: 2023-05-27 | Disposition: A | Discharge status: Home / Self Care | Payer: Medicare HMO | Source: Ambulatory Visit | Attending: Internal Medicine | Admitting: Internal Medicine

## 2023-05-27 DIAGNOSIS — Z87891 Personal history of nicotine dependence: Secondary | ICD-10-CM | POA: Insufficient documentation

## 2023-05-27 DIAGNOSIS — C778 Secondary and unspecified malignant neoplasm of lymph nodes of multiple regions: Secondary | ICD-10-CM | POA: Diagnosis not present

## 2023-05-27 DIAGNOSIS — C3490 Malignant neoplasm of unspecified part of unspecified bronchus or lung: Secondary | ICD-10-CM | POA: Insufficient documentation

## 2023-05-27 DIAGNOSIS — I700 Atherosclerosis of aorta: Secondary | ICD-10-CM | POA: Diagnosis not present

## 2023-05-27 DIAGNOSIS — J439 Emphysema, unspecified: Secondary | ICD-10-CM | POA: Diagnosis not present

## 2023-05-27 DIAGNOSIS — C3412 Malignant neoplasm of upper lobe, left bronchus or lung: Secondary | ICD-10-CM | POA: Diagnosis not present

## 2023-05-27 DIAGNOSIS — C3432 Malignant neoplasm of lower lobe, left bronchus or lung: Secondary | ICD-10-CM | POA: Diagnosis not present

## 2023-05-27 DIAGNOSIS — C3411 Malignant neoplasm of upper lobe, right bronchus or lung: Secondary | ICD-10-CM | POA: Insufficient documentation

## 2023-05-27 DIAGNOSIS — Z79899 Other long term (current) drug therapy: Secondary | ICD-10-CM | POA: Insufficient documentation

## 2023-05-27 LAB — CBC WITH DIFFERENTIAL (CANCER CENTER ONLY)
Abs Immature Granulocytes: 0.02 10*3/uL (ref 0.00–0.07)
Basophils Absolute: 0.1 10*3/uL (ref 0.0–0.1)
Basophils Relative: 1 %
Eosinophils Absolute: 0.3 10*3/uL (ref 0.0–0.5)
Eosinophils Relative: 5 %
HCT: 40.6 % (ref 39.0–52.0)
Hemoglobin: 13.7 g/dL (ref 13.0–17.0)
Immature Granulocytes: 0 %
Lymphocytes Relative: 22 %
Lymphs Abs: 1.3 10*3/uL (ref 0.7–4.0)
MCH: 31.2 pg (ref 26.0–34.0)
MCHC: 33.7 g/dL (ref 30.0–36.0)
MCV: 92.5 fL (ref 80.0–100.0)
Monocytes Absolute: 0.7 10*3/uL (ref 0.1–1.0)
Monocytes Relative: 12 %
Neutro Abs: 3.6 10*3/uL (ref 1.7–7.7)
Neutrophils Relative %: 60 %
Platelet Count: 169 10*3/uL (ref 150–400)
RBC: 4.39 MIL/uL (ref 4.22–5.81)
RDW: 12.5 % (ref 11.5–15.5)
WBC Count: 6 10*3/uL (ref 4.0–10.5)
nRBC: 0 % (ref 0.0–0.2)

## 2023-05-27 LAB — CMP (CANCER CENTER ONLY)
ALT: 6 U/L (ref 0–44)
AST: 10 U/L — ABNORMAL LOW (ref 15–41)
Albumin: 4.2 g/dL (ref 3.5–5.0)
Alkaline Phosphatase: 67 U/L (ref 38–126)
Anion gap: 4 — ABNORMAL LOW (ref 5–15)
BUN: 15 mg/dL (ref 8–23)
CO2: 30 mmol/L (ref 22–32)
Calcium: 9.3 mg/dL (ref 8.9–10.3)
Chloride: 103 mmol/L (ref 98–111)
Creatinine: 1.37 mg/dL — ABNORMAL HIGH (ref 0.61–1.24)
GFR, Estimated: 52 mL/min — ABNORMAL LOW (ref >60)
Glucose, Bld: 102 mg/dL — ABNORMAL HIGH (ref 70–99)
Potassium: 4.4 mmol/L (ref 3.5–5.1)
Sodium: 137 mmol/L (ref 135–145)
Total Bilirubin: 0.9 mg/dL (ref 0.0–1.2)
Total Protein: 7.3 g/dL (ref 6.5–8.1)

## 2023-06-02 ENCOUNTER — Inpatient Hospital Stay (HOSPITAL_BASED_OUTPATIENT_CLINIC_OR_DEPARTMENT_OTHER): Payer: Medicare HMO | Admitting: Internal Medicine

## 2023-06-02 VITALS — BP 115/55 | HR 67 | Temp 98.0°F | Resp 17 | Ht 70.0 in | Wt 176.7 lb

## 2023-06-02 DIAGNOSIS — C3490 Malignant neoplasm of unspecified part of unspecified bronchus or lung: Secondary | ICD-10-CM

## 2023-06-02 DIAGNOSIS — Z79899 Other long term (current) drug therapy: Secondary | ICD-10-CM | POA: Diagnosis not present

## 2023-06-02 DIAGNOSIS — C3411 Malignant neoplasm of upper lobe, right bronchus or lung: Secondary | ICD-10-CM | POA: Diagnosis not present

## 2023-06-02 DIAGNOSIS — Z87891 Personal history of nicotine dependence: Secondary | ICD-10-CM | POA: Diagnosis not present

## 2023-06-02 DIAGNOSIS — C778 Secondary and unspecified malignant neoplasm of lymph nodes of multiple regions: Secondary | ICD-10-CM | POA: Diagnosis not present

## 2023-06-02 NOTE — Progress Notes (Signed)
[facility] [phone]   Fax:([phone]  OFFICE PROGRESS NOTE  [name], MD [address] [place] [address]  DIAGNOSIS: Stage IIIC (T3, N3, M0) non-small cell lung cancer, adenocarcinoma presented with right upper lobe lung mass status post right upper lobe wedge resection but no lymph node dissection.  The tumor measured 3.3 cm with visceral pleural involvement and very close resection margin less than 0.1 cm from the tumor.  This was performed on May 13, 2021 in the same setting of CABG under the care of Dr. Cliffton Asters.  PET scan performed in March 2023 showed a right upper lobe nodule adjacent to the wedge resection site small hypermetabolic right hilar, right subcarinal, and left infrahilar lymph nodes which could be reactive but are suspicious for nodal involvement.   Molecular studies showed no actionable mutations and PD-L1 expression was 0%   PRIOR THERAPY:  1) Right upper lobe wedge resection under the care of Dr. Cliffton Asters on 05/13/2021. 2) Concurrent chemoradiation with weekly carboplatin for AUC of 2 and paclitaxel 45 Mg/M2.  First dose Aug 11, 2021.  Status post 6 cycles.  Last dose was given September 22, 2021. 3) Consolidation treatment with immunotherapy with Imfinzi 1500 Mg IV every 4 weeks.  First dose October 27, 2021.  Status post 13 cycles.   CURRENT THERAPY: Observation.  INTERVAL HISTORY: Kevin [name] 83 y.o. male returns to the clinic today for follow-up visit accompanied by his wife.Discussed the use of AI scribe software for clinical note transcription with the patient, who gave verbal consent to proceed.  History of Present Illness   Kevin [name] is an 83 year old male with stage III C non-small cell lung cancer adenocarcinoma who presents for follow-up after treatment. He is accompanied by his wife.  He was diagnosed with stage III C non-small cell lung cancer adenocarcinoma in March 2023. He completed chemotherapy and  radiation, followed by one year of treatment with Imfinzi (durvalumab), which concluded in June 2024.  Since his last visit in November 2024, he has experienced an increase in coughing. No hemoptysis, shortness of breath, or chest pain. A recent scan was performed last week, but the radiologist has not yet provided a report.  He has gained seven pounds since his last visit, attributing this to his wife's cooking and starting a new exercise regimen in the past week to improve his stamina.         MEDICAL HISTORY: Past Medical History:  Diagnosis Date   Coronary artery disease    CABG x 3 on 05/13/21, Follows w/ [facility].   History of immunotherapy    Imfinzi 1500 mg every 4 weeks, began 10/27/21.   Hx of bladder cancer    s/p multiple TURBT, follows w/ Dr. Retta Diones   Hyperlipidemia    Lung cancer (HCC)    RUL lung mass, s/p right uppler lobe wedge resection, Stage IIIc, non-small cell lung cancer, adenocarcinoma, Follows with [facility].   SCC (squamous cell carcinoma) 05/28/2004   top of left hand curet x3 92fu   SCC (squamous cell carcinoma) 09/13/2013   mid back tx cx3 69fu   SCC (squamous cell carcinoma) 09/06/2018   left wrist tx with bx   Squamous cell carcinoma of skin 11/16/2003   left ear tip tx curet and cautery 10fu   Status post chemoradiation    Concurrent chemoradiation with weekly carboplatin for AUC of 2 and paclitaxel 45 mg/M2. First dose  08/11/2021. Status post 6 cycles. Last dose 09/22/21.    ALLERGIES:  is allergic to atorvastatin.  MEDICATIONS:  Current Outpatient Medications  Medication Sig Dispense Refill   tadalafil (CIALIS) 20 MG tablet Take one 20 mg tablet at least 60 minutes prior to anticipated sexual activity to achieve erection. Do not use more than once per day. 20 tablet 1   acetaminophen (TYLENOL) 500 MG tablet Take 500 mg by mouth daily as needed for moderate pain, fever or headache.     aspirin EC 81 MG tablet Take 81  mg by mouth daily.     Calcium Carbonate-Vit D-Min (CALCIUM 1200 PO) Take by mouth.     Cholecalciferol (D3 5000 PO) Take by mouth.     metoprolol tartrate (LOPRESSOR) 25 MG tablet TAKE 1 TABLET BY MOUTH TWICE A DAY (Patient taking differently: Take 12.5 mg by mouth 2 (two) times daily.) 180 tablet 3   nitroGLYCERIN (NITROSTAT) 0.4 MG SL tablet Place 0.4 mg under the tongue every 5 (five) minutes as needed for chest pain.     rosuvastatin (CRESTOR) 20 MG tablet Take 1 tablet (20 mg total) by mouth daily. 90 tablet 1   No current facility-administered medications for this visit.    SURGICAL HISTORY:  Past Surgical History:  Procedure Laterality Date   BRONCHIAL BIOPSY  07/22/2021   Procedure: BRONCHIAL BIOPSIES;  Surgeon: Leslye Peer, MD;  Location: [facility] ENDOSCOPY;  Service: Cardiopulmonary;;   BRONCHIAL BRUSHINGS  07/22/2021   Procedure: BRONCHIAL BRUSHINGS;  Surgeon: Leslye Peer, MD;  Location: [facility] ENDOSCOPY;  Service: Cardiopulmonary;;   BRONCHIAL NEEDLE ASPIRATION BIOPSY  07/22/2021   Procedure: BRONCHIAL NEEDLE ASPIRATION BIOPSIES;  Surgeon: Leslye Peer, MD;  Location: [facility] ENDOSCOPY;  Service: Cardiopulmonary;;   CHOLECYSTECTOMY N/A 02/15/2022   Procedure: LAPAROSCOPIC CHOLECYSTECTOMY;  Surgeon: Lucretia Roers, MD;  Location: [facility] ORS;  Service: General;  Laterality: N/A;   CORONARY ARTERY BYPASS GRAFT N/A 05/13/2021   Procedure: CORONARY ARTERY BYPASS GRAFTING (CABG) TIMES 3  , ON PUMP, USING LEFT INTERNAL MAMMARY ARTERY AND RIGHT GREATER SAPHENOUS VEIN HARVESTED ENDOSCOPICALLY;  Surgeon: Corliss Skains, MD;  Location: [facility] OR;  Service: Open Heart Surgery;  Laterality: N/A;   CYSTOSCOPY WITH LITHOLAPAXY N/A 12/10/2022   Procedure: CYSTOSCOPY WITH LITHOLAPAXY and FULGURATION;  Surgeon: Marcine Matar, MD;  Location: [facility];  Service: Urology;  Laterality: N/A;   ENDOVEIN HARVEST OF GREATER SAPHENOUS VEIN Right 05/13/2021   Procedure: ENDOVEIN HARVEST OF  GREATER SAPHENOUS VEIN;  Surgeon: Corliss Skains, MD;  Location: [facility] OR;  Service: Open Heart Surgery;  Laterality: Right;   epidural     back pain   HOLMIUM LASER APPLICATION N/A 12/10/2022   Procedure: HOLMIUM LASER APPLICATION;  Surgeon: Marcine Matar, MD;  Location[facility];  Service: Urology;  Laterality: N/A;   IR CT SPINE LTD  06/18/2020   IR KYPHO LUMBAR INC FX REDUCE BONE BX UNI/BIL CANNULATION INC/IMAGING  06/18/2020   LEFT HEART CATH AND CORONARY ANGIOGRAPHY N/A 05/06/2021   Procedure: LEFT HEART CATH AND CORONARY ANGIOGRAPHY;  Surgeon: Yates Decamp, MD;  [facility]: [facility] INVASIVE CV LAB;  Service: Cardiovascular;  Laterality: N/A;   LUNG BIOPSY Right 05/13/2021   Procedure: RIGHT LUNG BIOPSY;  Surgeon: Corliss Skains, MD;  Location: [facility] OR;  Service: Open Heart Surgery;  Laterality: Right;   MINOR HEMORRHOIDECTOMY     SHOULDER SURGERY     TEE WITHOUT CARDIOVERSION N/A 05/13/2021   Procedure: TRANSESOPHAGEAL ECHOCARDIOGRAM (TEE);  Surgeon: Corliss Skains, MD;  Location: [facility] OR;  Service: Open Heart Surgery;  Laterality: N/A;   TRANSURETHRAL RESECTION OF BLADDER TUMOR  2014   TRANSURETHRAL RESECTION OF BLADDER TUMOR N/A 05/19/2018   Procedure: TRANSURETHRAL RESECTION OF BLADDER TUMOR (TURBT) AND (INTRAVESICAL GEMCITABINE INSTILLED IN BLADDER IN PACU) ;  Surgeon: Marcine Matar, MD;  Location: [facility]S;  Service: Urology;  Laterality: N/A;  30 MINS   VIDEO BRONCHOSCOPY WITH ENDOBRONCHIAL ULTRASOUND Bilateral 07/22/2021   Procedure: VIDEO BRONCHOSCOPY WITH ENDOBRONCHIAL ULTRASOUND;  Surgeon: Leslye Peer, MD;  Location: [facility] ENDOSCOPY;  Service: Cardiopulmonary;  Laterality: Bilateral;    REVIEW OF SYSTEMS:  A comprehensive review of systems was negative.   PHYSICAL EXAMINATION: General appearance: alert, cooperative, and no distress Head: Normocephalic, without obvious abnormality, atraumatic Neck: no adenopathy, no JVD, supple, symmetrical, trachea  midline, and thyroid not enlarged, symmetric, no tenderness/mass/nodules Lymph nodes: Cervical, supraclavicular, and axillary nodes normal. Resp: clear to auscultation bilaterally Back: symmetric, no curvature. ROM normal. No CVA tenderness. Cardio: regular rate and rhythm, S1, S2 normal, no murmur, click, rub or gallop GI: soft, non-tender; bowel sounds normal; no masses,  no organomegaly Extremities: extremities normal, atraumatic, no cyanosis or edema  ECOG PERFORMANCE STATUS: 0 - Asymptomatic  Blood pressure (!) 115/55, pulse 67, temperature 98 F (36.7 C), temperature source Temporal, resp. rate 17, height 5\' 10"  (1.778 m), weight 176 lb 11.2 oz (80.2 kg), SpO2 98%.  LABORATORY DATA: Lab Results  Component Value Date   WBC 6.0 05/27/2023   HGB 13.7 05/27/2023   HCT 40.6 05/27/2023   MCV 92.5 05/27/2023   PLT 169 05/27/2023      Chemistry      Component Value Date/Time   NA 137 05/27/2023 0816   NA 139 05/01/2021 1146   K 4.4 05/27/2023 0816   CL 103 05/27/2023 0816   CO2 30 05/27/2023 0816   BUN 15 05/27/2023 0816   BUN 20 05/01/2021 1146   CREATININE 1.37 (H) 05/27/2023 0816      Component Value Date/Time   CALCIUM 9.3 05/27/2023 0816   ALKPHOS 67 05/27/2023 0816   AST 10 (L) 05/27/2023 0816   ALT 6 05/27/2023 0816   BILITOT 0.9 05/27/2023 0816       RADIOGRAPHIC STUDIES: No results found.  ASSESSMENT AND PLAN: This is a very pleasant 83 years old white male diagnosed with stage IIIC (T3, N3, M0) non-small cell lung cancer, adenocarcinoma with no actionable mutations and PD-L1 expression of 0% diagnosed in January 2023 status post wedge resection of the right upper lobe lung mass in a setting of CABG under the care of Dr. Cliffton Asters but the PET scan showed residual disease in the right upper lobe in addition to hypermetabolic right hilar, subcarinal and left infrahilar lymph nodes. The patient was seen by Dr. Delton Coombes and biopsy of the right upper lobe as well as  the subcarinal and left hilar lymph nodes were positive for malignancy consistent with non-small cell lung cancer, adenocarcinoma. He underwent a course of concurrent chemoradiation with weekly carboplatin for AUC of 2 and paclitaxel 45 Mg/M2 status post 6 cycles.  The patient tolerated this previous course of concurrent chemoradiation fairly well except for fatigue and mild odynophagia. The patient underwent a course of consolidation treatment with immunotherapy with Imfinzi 1500 Mg IV every 4 weeks status post 13 cycles.    The patient is currently on observation and he is feeling fine with no concerning complaints. He had repeat CT scan of  the chest performed recently but the final report is still pending.  I personally and independently reviewed the scan images and I do not see any clear evidence for progression but I will wait for the final report for confirmation.    Stage III C Non-Small Cell Lung Cancer (Adenocarcinoma) Diagnosed in March 2023. Completed chemotherapy, radiation, and one year of durvalumab (Imfinzi) in June 2024. Currently under surveillance. Recent imaging showed no new findings; awaiting radiologist's confirmation. Reports increased coughing but denies hemoptysis, dyspnea, or chest pain. Gained seven pounds since the last visit, attributed to improved diet and exercise. - Await radiologist's report on recent scan - Schedule follow-up in four months - Call if concerning findings are noted in the radiologist's report  General Health Maintenance Positive lifestyle changes with weight gain and increased exercise. - Encourage continued healthy diet and exercise.   For the recently diagnosed bladder cancer, he is being evaluated by urology by Dr. Retta Diones. The patient was advised to call immediately if he has any concerning symptoms in the interval. The patient voices understanding of current disease status and treatment options and is in agreement with the current care  plan. All questions were answered. The patient knows to call the clinic with any problems, questions or concerns. We can certainly see the patient much sooner if necessary. The total time spent in the appointment was 20 minutes.   Disclaimer: This note was dictated with voice recognition software. Similar sounding words can inadvertently be transcribed and may not be corrected upon review.

## 2023-06-03 ENCOUNTER — Other Ambulatory Visit: Payer: Self-pay

## 2023-06-03 ENCOUNTER — Telehealth: Payer: Self-pay | Admitting: Internal Medicine

## 2023-06-03 DIAGNOSIS — I2510 Atherosclerotic heart disease of native coronary artery without angina pectoris: Secondary | ICD-10-CM

## 2023-06-03 DIAGNOSIS — E7800 Pure hypercholesterolemia, unspecified: Secondary | ICD-10-CM

## 2023-06-03 MED ORDER — ROSUVASTATIN CALCIUM 20 MG PO TABS: 20.0000 mg | ORAL_TABLET | Freq: Every day | ORAL | 0 refills | Status: DC

## 2023-06-03 NOTE — Telephone Encounter (Signed)
Scheduled patient's appointment closer to CT scan appointment. Patient is aware of all appointment changes.

## 2023-06-22 ENCOUNTER — Other Ambulatory Visit: Payer: Self-pay

## 2023-06-22 DIAGNOSIS — L570 Actinic keratosis: Secondary | ICD-10-CM | POA: Diagnosis not present

## 2023-06-25 LAB — SURGICAL PATHOLOGY

## 2023-06-29 ENCOUNTER — Telehealth: Payer: Self-pay

## 2023-06-29 ENCOUNTER — Ambulatory Visit: Payer: Medicare HMO | Admitting: Urology

## 2023-06-29 ENCOUNTER — Ambulatory Visit: Admitting: Urology

## 2023-06-29 ENCOUNTER — Ambulatory Visit (HOSPITAL_COMMUNITY)
Admission: RE | Admit: 2023-06-29 | Discharge: 2023-06-29 | Disposition: A | Discharge status: Home / Self Care | Source: Ambulatory Visit | Attending: Urology | Admitting: Urology

## 2023-06-29 ENCOUNTER — Encounter: Payer: Self-pay | Admitting: Urology

## 2023-06-29 VITALS — BP 103/53 | HR 83

## 2023-06-29 DIAGNOSIS — N210 Calculus in bladder: Secondary | ICD-10-CM | POA: Diagnosis not present

## 2023-06-29 DIAGNOSIS — Z87442 Personal history of urinary calculi: Secondary | ICD-10-CM | POA: Diagnosis not present

## 2023-06-29 DIAGNOSIS — Z8551 Personal history of malignant neoplasm of bladder: Secondary | ICD-10-CM

## 2023-06-29 DIAGNOSIS — N200 Calculus of kidney: Secondary | ICD-10-CM | POA: Diagnosis not present

## 2023-06-29 DIAGNOSIS — N401 Enlarged prostate with lower urinary tract symptoms: Secondary | ICD-10-CM

## 2023-06-29 LAB — URINALYSIS, ROUTINE W REFLEX MICROSCOPIC
Bilirubin, UA: NEGATIVE
Glucose, UA: NEGATIVE
Ketones, UA: NEGATIVE
Leukocytes,UA: NEGATIVE
Nitrite, UA: NEGATIVE
Protein,UA: NEGATIVE
Specific Gravity, UA: 1.02 (ref 1.005–1.030)
Urobilinogen, Ur: 1 mg/dL (ref 0.2–1.0)
pH, UA: 6 (ref 5.0–7.5)

## 2023-06-29 LAB — MICROSCOPIC EXAMINATION
Bacteria, UA: NONE SEEN
WBC, UA: NONE SEEN /HPF (ref 0–5)

## 2023-06-29 NOTE — Progress Notes (Signed)
 History of Present Illness: Here for bladder cancer surveillance.     4.24.2017: TURBT of a 2 cm low-grade nonmuscle invasive bladder tumor by Dr. Nechama Guard.  Repeat resection for recurrence in August 2017.  Mitomycin was placed after each procedure.   2.6.2020: Repeat TURBT/gemcitabine for 15 mm right posterior bladder wall lesion, low-grade nonmuscle invasive on pathology review.   5.19.2019:  UroLift for BPH symptoms.  6 implants were placed.   10.25.2022: Now back on tamsulosin because of nocturia x3-4.  No gross hematuria.  Tamsulosin has improved his nighttime voiding.  He does drink coffee in the evening.   5.23.2023: He was recently diagnosed with severe CAD, underwent urgent CABGx3 by Dr. Cliffton Asters.  He had a wedge resection of a RUL mass (non-small cell Ca, stage III) at the same time.     1.23.2024: Cysto negative. Bladder calculi on PET not seen.  11/2022: Underwent cystoscopy, cystolitholapaxy of stone on the capsular tab, excision of capsular tab, fulguration of bladder neck.  3.18.2025: First follow-up with me since his cystoscopy.  He is voiding well.  IPSS 4/1.  No blood in his urine.   Past Medical History:  Diagnosis Date   Coronary artery disease    CABG x 3 on 05/13/21, Follows w/ [facility].   History of immunotherapy    Imfinzi 1500 mg every 4 weeks, began 10/27/21.   Hx of bladder cancer    s/p multiple TURBT, follows w/ Dr. Retta Diones   Hyperlipidemia    Lung cancer (HCC)    RUL lung mass, s/p right uppler lobe wedge resection, Stage IIIc, non-small cell lung cancer, adenocarcinoma, Follows with [facility].   SCC (squamous cell carcinoma) 05/28/2004   top of left hand curet x3 20fu   SCC (squamous cell carcinoma) 09/13/2013   mid back tx cx3 64fu   SCC (squamous cell carcinoma) 09/06/2018   left wrist tx with bx   Squamous cell carcinoma of skin 11/16/2003   left ear tip tx curet and cautery 25fu   Status post chemoradiation     Concurrent chemoradiation with weekly carboplatin for AUC of 2 and paclitaxel 45 mg/M2. First dose 08/11/2021. Status post 6 cycles. Last dose 09/22/21.    Past Surgical History:  Procedure Laterality Date   BRONCHIAL BIOPSY  07/22/2021   Procedure: BRONCHIAL BIOPSIES;  Surgeon: Leslye Peer, MD;  Location: [facility] ENDOSCOPY;  Service: Cardiopulmonary;;   BRONCHIAL BRUSHINGS  07/22/2021   Procedure: BRONCHIAL BRUSHINGS;  Surgeon: Leslye Peer, MD;  Location: [facility] ENDOSCOPY;  Service: Cardiopulmonary;;   BRONCHIAL NEEDLE ASPIRATION BIOPSY  07/22/2021   Procedure: BRONCHIAL NEEDLE ASPIRATION BIOPSIES;  Surgeon: Leslye Peer, MD;  Location: [facility] ENDOSCOPY;  Service: Cardiopulmonary;;   CHOLECYSTECTOMY N/A 02/15/2022   Procedure: LAPAROSCOPIC CHOLECYSTECTOMY;  Surgeon: Lucretia Roers, MD;  Location: [facility] ORS;  Service: General;  Laterality: N/A;   CORONARY ARTERY BYPASS GRAFT N/A 05/13/2021   Procedure: CORONARY ARTERY BYPASS GRAFTING (CABG) TIMES 3  , ON PUMP, USING LEFT INTERNAL MAMMARY ARTERY AND RIGHT GREATER SAPHENOUS VEIN HARVESTED ENDOSCOPICALLY;  Surgeon: Corliss Skains, MD;  Location: [facility] OR;  Service: Open Heart Surgery;  Laterality: N/A;   CYSTOSCOPY WITH LITHOLAPAXY N/A 12/10/2022   Procedure: CYSTOSCOPY WITH LITHOLAPAXY and FULGURATION;  Surgeon: Marcine Matar, MD;  Location: [facility];  Service: Urology;  Laterality: N/A;   ENDOVEIN HARVEST OF GREATER SAPHENOUS VEIN Right 05/13/2021   Procedure: ENDOVEIN HARVEST OF GREATER SAPHENOUS VEIN;  Surgeon: Corliss Skains, MD;  Location: [facility] OR;  Service: Open Heart Surgery;  Laterality: Right;   epidural     back pain   HOLMIUM LASER APPLICATION N/A 12/10/2022   Procedure: HOLMIUM LASER APPLICATION;  Surgeon: Marcine Matar, MD;  Location: [facility];  Service: Urology;  Laterality: N/A;   IR CT SPINE LTD  06/18/2020   IR KYPHO LUMBAR INC FX REDUCE BONE BX UNI/BIL CANNULATION INC/IMAGING  06/18/2020    LEFT HEART CATH AND CORONARY ANGIOGRAPHY N/A 05/06/2021   Procedure: LEFT HEART CATH AND CORONARY ANGIOGRAPHY;  Surgeon: Yates Decamp, MD;  Location: [facility] INVASIVE CV LAB;  Service: Cardiovascular;  Laterality: N/A;   LUNG BIOPSY Right 05/13/2021   Procedure: RIGHT LUNG BIOPSY;  Surgeon: Corliss Skains, MD;  Location: [facility] OR;  Service: Open Heart Surgery;  Laterality: Right;   MINOR HEMORRHOIDECTOMY     SHOULDER SURGERY     TEE WITHOUT CARDIOVERSION N/A 05/13/2021   Procedure: TRANSESOPHAGEAL ECHOCARDIOGRAM (TEE);  Surgeon: Corliss Skains, MD;  Location: [facility] OR;  Service: Open Heart Surgery;  Laterality: N/A;   TRANSURETHRAL RESECTION OF BLADDER TUMOR  2014   TRANSURETHRAL RESECTION OF BLADDER TUMOR N/A 05/19/2018   Procedure: TRANSURETHRAL RESECTION OF BLADDER TUMOR (TURBT) AND (INTRAVESICAL GEMCITABINE INSTILLED IN BLADDER IN PACU) ;  Surgeon: Marcine Matar, MD;  Location: [facility];  Service: Urology;  Laterality: N/A;  30 MINS   VIDEO BRONCHOSCOPY WITH ENDOBRONCHIAL ULTRASOUND Bilateral 07/22/2021   Procedure: VIDEO BRONCHOSCOPY WITH ENDOBRONCHIAL ULTRASOUND;  Surgeon: Leslye Peer, MD;  Location: [facility] ENDOSCOPY;  Service: Cardiopulmonary;  Laterality: Bilateral;    Home Medications:  Allergies as of 06/29/2023       Reactions   Atorvastatin Cough        Medication List        Accurate as of June 29, 2023  3:16 PM. If you have any questions, ask your nurse or doctor.          acetaminophen 500 MG tablet Commonly known as: TYLENOL Take 500 mg by mouth daily as needed for moderate pain, fever or headache.   aspirin EC 81 MG tablet Take 81 mg by mouth daily.   CALCIUM 1200 PO Take by mouth.   D3 5000 PO Take by mouth.   metoprolol tartrate 25 MG tablet Commonly known as: LOPRESSOR TAKE 1 TABLET BY MOUTH TWICE A DAY What changed: how much to take   nitroGLYCERIN 0.4 MG SL tablet Commonly known as: NITROSTAT Place 0.4 mg under the tongue every 5 (five) minutes  as needed for chest pain.   rosuvastatin 20 MG tablet Commonly known as: CRESTOR Take 1 tablet (20 mg total) by mouth daily.   tadalafil 20 MG tablet Commonly known as: CIALIS Take one 20 mg tablet at least 60 minutes prior to anticipated sexual activity to achieve erection. Do not use more than once per day.        Allergies:  Allergies  Allergen Reactions   Atorvastatin Cough    Family History  Problem Relation Age of Onset   Alzheimer's disease Mother 7   Congestive Heart Failure Father 61   Heart disease Brother 40   Heart failure Brother 4   Heart failure Brother 67    Social History:  reports that he quit smoking about 28 years ago. His smoking use included cigarettes. He started smoking about 58 years ago. He has a 30 pack-year smoking history. He has never used smokeless tobacco. He reports current alcohol use. He reports that he  does not use drugs.  ROS: A complete review of systems was performed.  All systems are negative except for pertinent findings as noted.  Physical Exam:  Vital signs in last 24 hours: BP (!) 103/53   Pulse 83  Constitutional:  Alert and oriented, No acute distress Cardiovascular: Regular rate  Respiratory: Normal respiratory effort Neurologic: Grossly intact, no focal deficits Psychiatric: Normal mood and affect  I have reviewed prior pt notes  I have reviewed notes from Dr. Arbutus Ped  I have reviewed urinalysis results--persistent microscopic hematuria   Impression/Assessment:  1.  History of low-grade nonmuscle invasive bladder cancer.  Last cystoscopy in August, 2024 revealed no evidence of recurrence  2.  12 mm bladder calculus, status post cystolitholapaxy  3.  BPH, status post UroLift, voiding well  Plan:  I will see him back in 1 year for cystoscopy

## 2023-06-29 NOTE — Telephone Encounter (Signed)
 Called Pt to let him know he needed his xray done and he could come to his OV at 2:45 Pt agreed

## 2023-07-15 ENCOUNTER — Ambulatory Visit: Payer: Medicare HMO | Admitting: Cardiology

## 2023-07-16 ENCOUNTER — Ambulatory Visit: Payer: Medicare HMO | Admitting: Emergency Medicine

## 2023-07-16 ENCOUNTER — Encounter: Payer: Self-pay | Admitting: Emergency Medicine

## 2023-07-16 VITALS — BP 139/81 | HR 99 | Ht 68.0 in | Wt 173.0 lb

## 2023-07-16 DIAGNOSIS — C3491 Malignant neoplasm of unspecified part of right bronchus or lung: Secondary | ICD-10-CM

## 2023-07-16 DIAGNOSIS — R053 Chronic cough: Secondary | ICD-10-CM | POA: Diagnosis not present

## 2023-07-16 MED ORDER — PANTOPRAZOLE SODIUM 40 MG PO TBEC: 40.0000 mg | DELAYED_RELEASE_TABLET | Freq: Every day | ORAL | 2 refills | Status: DC

## 2023-07-16 NOTE — Assessment & Plan Note (Signed)
 Follow-up and imaging as per Dr. Asa Lente plans.

## 2023-07-16 NOTE — Patient Instructions (Signed)
 We will try starting some short-term medications to see if we can treat factors that often impact and sustain cough: - Start loratadine 10 mg once daily (generic Claritin) until next visit - Start pantoprazole 40 mg once daily until next visit.  Take this medication 1 hour around food We will consider performing pulmonary function testing at some point in the future Continue to follow with Dr. Arbutus Ped and get your CT scans of the chest as per his recommendations and timing Follow-up with [facility] in about 4 to 6 weeks to see how your cough is doing and decide which medicines above to continue or stop Follow Dr. Delton Coombes in 6 months, sooner if you have any problems

## 2023-07-16 NOTE — Progress Notes (Signed)
 Subjective:    Patient ID: Kevin [name], male    DOB: 1940-04-21, 83 y.o.   MRN: [id number]  HPI 83 year old man, former smoker (30 pack years) with CAD/CABG, bladder cancer, stage IIIc right upper lobe adenocarcinoma post wedge resection and then concurrent chemoradiation 08/2021, consolidation treatment with Imfinzi.  Currently on observation.  Not currently on inhaled medication.  I do not have pulmonary function testing available.  Per Dr. Myrlene Broker notes he has tried Markus Daft before, not on currently. His persistent problem, most intrusive problem is cough. He was much better after a med change by Dr Jacinto Halim (presume ACE-I). Cough cropped back up a month no URI or inciting event. Denies any significant PND or allergies. Some cough at night when he sleeps. Rarely productive. Does not lose his voice. No GERD sx. He can have some dyspnea w yard work, otherwise good exertional tolerance.   CT scan of the chest 05/27/2023 reviewed by me shows no mediastinal or hilar adenopathy, emphysematous change with stable scattered scarring including confluent fibrosis at the right apex and paramediastinal region with some associated calcification, confluent bandlike scarring at the right lung base, left apical scarring with associated calcification.   Review of Systems As per HPI  Past Medical History:  Diagnosis Date   Coronary artery disease    CABG x 3 on 05/13/21, Follows w/ [facility].   History of immunotherapy    Imfinzi 1500 mg every 4 weeks, began 10/27/21.   Hx of bladder cancer    s/p multiple TURBT, follows w/ Dr. Retta Diones   Hyperlipidemia    Lung cancer (HCC)    RUL lung mass, s/p right uppler lobe wedge resection, Stage IIIc, non-small cell lung cancer, adenocarcinoma, Follows with [facility].   SCC (squamous cell carcinoma) 05/28/2004   top of left hand curet x3 51fu   SCC (squamous cell carcinoma) 09/13/2013   mid back tx cx3 28fu   SCC (squamous cell  carcinoma) 09/06/2018   left wrist tx with bx   Squamous cell carcinoma of skin 11/16/2003   left ear tip tx curet and cautery 51fu   Status post chemoradiation    Concurrent chemoradiation with weekly carboplatin for AUC of 2 and paclitaxel 45 mg/M2. First dose 08/11/2021. Status post 6 cycles. Last dose 09/22/21.     Family History  Problem Relation Age of Onset   Alzheimer's disease Mother 68   Congestive Heart Failure Father 61   Heart disease Brother 40   Heart failure Brother 20   Heart failure Brother 39     Social History   Socioeconomic History   Marital status: Married    Spouse name: Not on file   Number of children: 2   Years of education: Not on file   Highest education level: Not on file  Occupational History   Not on file  Tobacco Use   Smoking status: Former    Current packs/day: 0.00    Average packs/day: 1 pack/day for 30.0 years (30.0 ttl pk-yrs)    Types: Cigarettes    Start date: 04/13/1965    Quit date: 04/14/1995    Years since quitting: 28.2   Smokeless tobacco: Never  Vaping Use   Vaping status: Never Used  Substance and Sexual Activity   Alcohol use: Yes    Comment: rarely   Drug use: Never   Sexual activity: Not on file  Other Topics Concern   Not on file  Social History Narrative   Not on file  Social Drivers of Corporate investment banker Strain: Not on file  Food Insecurity: No Food Insecurity (02/14/2022)   Hunger Vital Sign    Worried About Running Out of Food in the Last Year: Never true    Ran Out of Food in the Last Year: Never true  Transportation Needs: No Transportation Needs (02/14/2022)   PRAPARE - Administrator, Civil Service (Medical): No    Lack of Transportation (Non-Medical): No  Physical Activity: Not on file  Stress: Not on file  Social Connections: Not on file  Intimate Partner Violence: Not At Risk (02/14/2022)   Humiliation, Afraid, Rape, and Kick questionnaire    Fear of Current or Ex-Partner: No     Emotionally Abused: No    Physically Abused: No    Sexually Abused: No     Allergies  Allergen Reactions   Atorvastatin Cough     Outpatient Medications Prior to Visit  Medication Sig Dispense Refill   acetaminophen (TYLENOL) 500 MG tablet Take 500 mg by mouth daily as needed for moderate pain, fever or headache.     aspirin EC 81 MG tablet Take 81 mg by mouth daily.     Calcium Carbonate-Vit D-Min (CALCIUM 1200 PO) Take by mouth.     Cholecalciferol (D3 5000 PO) Take by mouth.     metoprolol tartrate (LOPRESSOR) 25 MG tablet TAKE 1 TABLET BY MOUTH TWICE A DAY (Patient taking differently: Take 12.5 mg by mouth 2 (two) times daily.) 180 tablet 3   rosuvastatin (CRESTOR) 20 MG tablet Take 1 tablet (20 mg total) by mouth daily. 90 tablet 0   tadalafil (CIALIS) 20 MG tablet Take one 20 mg tablet at least 60 minutes prior to anticipated sexual activity to achieve erection. Do not use more than once per day. (Patient taking differently: Take one 20 mg tablet at least 60 minutes prior to anticipated sexual activity to achieve erection. Do not use more than once per day.  As needed) 20 tablet 1   nitroGLYCERIN (NITROSTAT) 0.4 MG SL tablet Place 0.4 mg under the tongue every 5 (five) minutes as needed for chest pain. (Patient not taking: Reported on 07/16/2023)     No facility-administered medications prior to visit.         Objective:   Physical Exam  Vitals:   07/16/23 0935  BP: 139/81  Pulse: 99  SpO2: 95%  Weight: 173 lb (78.5 kg)  Height: 5\' 8"  (1.727 m)   Gen: Pleasant, well-nourished, in no distress,  normal affect  ENT: No lesions,  mouth clear,  oropharynx clear, no postnasal drip.  Occasional throat clearing and cough  Neck: No JVD, no stridor  Lungs: No use of accessory muscles, few inspiratory crackles at the left base.  Otherwise clear  Cardiovascular: RRR, heart sounds normal, no murmur or gallops, no peripheral edema  Musculoskeletal: No deformities, no cyanosis  or clubbing  Neuro: alert, awake, non focal  Skin: Warm, no lesions or rash     Assessment & Plan:  Chronic cough Initially significantly better after he made a medication change, I presume an ACE inhibitor.  About a month ago began to crop up again.  He is not noticing any GERD or rhinitis but I suspect that it correlates with the approaching allergy season.  I will try treating him empirically for both GERD and rhinitis to see if we can achieve control.  Talked to him about voice rest, avoiding throat clearing, using an unmethylated cough drops.  His functional capacity is good that we could consider pulmonary function testing, even possibly BD therapy going forward depending on the course.  We will try starting some short-term medications to see if we can treat factors that often impact and sustain cough: - Start loratadine 10 mg once daily (generic Claritin) until next visit - Start pantoprazole 40 mg once daily until next visit.  Take this medication 1 hour around food We will consider performing pulmonary function testing at some point in the future Follow-up with APP in about 4 to 6 weeks to see how your cough is doing and decide which medicines above to continue or stop Follow Dr. Delton Coombes in 6 months, sooner if you have any problems    Adenocarcinoma of right lung, stage III (HCC) Follow-up and imaging as per Dr. Asa Lente plans.   Time spent 40 minutes  Levy Pupa, MD, PhD 07/16/2023, 10:12 AM [facility] [phone] or if no answer before 7:00PM call [phone] For any issues after 7:00PM please call eLink [phone]

## 2023-07-16 NOTE — Assessment & Plan Note (Signed)
 Initially significantly better after he made a medication change, I presume an ACE inhibitor.  About a month ago began to crop up again.  He is not noticing any GERD or rhinitis but I suspect that it correlates with the approaching allergy season.  I will try treating him empirically for both GERD and rhinitis to see if we can achieve control.  Talked to him about voice rest, avoiding throat clearing, using an unmethylated cough drops.  His functional capacity is good that we could consider pulmonary function testing, even possibly BD therapy going forward depending on the course.  We will try starting some short-term medications to see if we can treat factors that often impact and sustain cough: - Start loratadine 10 mg once daily (generic Claritin) until next visit - Start pantoprazole 40 mg once daily until next visit.  Take this medication 1 hour around food We will consider performing pulmonary function testing at some point in the future Follow-up with APP in about 4 to 6 weeks to see how your cough is doing and decide which medicines above to continue or stop Follow Dr. Delton Coombes in 6 months, sooner if you have any problems

## 2023-07-19 ENCOUNTER — Telehealth: Payer: Self-pay

## 2023-07-19 NOTE — Telephone Encounter (Signed)
 Copied from CRM [id number]. Topic: Clinical - Medical Advice >> Jul 19, 2023 10:11 AM Theodis Sato wrote: Reason for CRM: Patient states he saw Dr. Delton Coombes last Friday and was advised by him to start a new cough drop, but which cough drop Dr. Delton Coombes wants the patient to start is not listed on his paperwork. Patient is requesting a phone call back from a nurse after asking Dr. Delton Coombes which cough drop will work the best for his chronic cough.  Please advise which cough drop you advised pt to start

## 2023-07-19 NOTE — Telephone Encounter (Signed)
 ATC x1 LDVM. NFN

## 2023-07-19 NOTE — Telephone Encounter (Signed)
 I wanted him to avoid mentholated cough drops. He can use any non-mentholated cough drop, a sugar-free candy, etc.

## 2023-08-24 ENCOUNTER — Ambulatory Visit: Attending: Cardiology | Admitting: Cardiology

## 2023-08-24 ENCOUNTER — Encounter: Payer: Self-pay | Admitting: Cardiology

## 2023-08-24 VITALS — BP 126/62 | HR 92 | Resp 16 | Ht 68.0 in | Wt 169.2 lb

## 2023-08-24 DIAGNOSIS — I2510 Atherosclerotic heart disease of native coronary artery without angina pectoris: Secondary | ICD-10-CM

## 2023-08-24 DIAGNOSIS — E7800 Pure hypercholesterolemia, unspecified: Secondary | ICD-10-CM | POA: Diagnosis not present

## 2023-08-24 DIAGNOSIS — N1831 Chronic kidney disease, stage 3a: Secondary | ICD-10-CM | POA: Diagnosis not present

## 2023-08-24 MED ORDER — LOSARTAN POTASSIUM 25 MG PO TABS: 12.5000 mg | ORAL_TABLET | Freq: Every evening | ORAL | 3 refills | Status: DC

## 2023-08-24 NOTE — Progress Notes (Signed)
 " Cardiology Office Note:  .   Date:  08/24/2023  ID:  Kevin [name], DOB 1941/02/27, MRN [id number] PCP: Rosamond Leta NOVAK, MD  [facility] Providers Cardiologist:  Gordy Bergamo, MD   History of Present Illness: .   Kevin [name] is a 83 y.o. Caucasian male with history of hyperlipidemia, stage IIIa chronic kidney disease, 30-pack-year history of smoking quit in 1987 and centrilobular emphysema, cardiac catheterization 05/01/2021 revealing multivessel disease including critical left main disease, subsequently underwent CABG x3 on 05/13/2021. During admission patient diagnosed with right upper lung adenocarcinoma, and underwent lung wedge resection. He is being followed by oncology as well.   Discussed the use of AI scribe software for clinical note transcription with the patient, who gave verbal consent to proceed.  History of Present Illness Kevin [name] is an 83 year old male with coronary artery disease who presents for follow-up.  He is asymptomatic with no chest pain and is doing well. His medication regimen includes one baby aspirin  daily and metoprolol  tartrate 25 mg twice a day. Hyperlipidemia is managed with rosuvastatin  20 mg daily, with cholesterol levels last checked in June showing good control. Current cholesterol levels are pending.  He has stage 3A chronic kidney disease with a stable GFR of 54 mL/min, indicating mild kidney dysfunction. This condition is stable and not currently causing concern.   Labs   Lab Results  Component Value Date   NA 137 05/27/2023   K 4.4 05/27/2023   CO2 30 05/27/2023   GLUCOSE 102 (H) 05/27/2023   BUN 15 05/27/2023   CREATININE 1.37 (H) 05/27/2023   CALCIUM  9.3 05/27/2023   EGFR 52 (L) 05/01/2021   GFRNONAA 52 (L) 05/27/2023      Latest Ref Rng & Units 05/27/2023    8:16 AM 02/23/2023    8:51 AM 11/30/2022    8:45 AM  BMP  Glucose 70 - 99 mg/dL 897  95  97   BUN 8 - 23 mg/dL 15  17  16    Creatinine 0.61 - 1.24 mg/dL 8.62  8.56   8.66   Sodium 135 - 145 mmol/L 137  138  138   Potassium 3.5 - 5.1 mmol/L 4.4  4.5  4.4   Chloride 98 - 111 mmol/L 103  104  103   CO2 22 - 32 mmol/L 30  30  29    Calcium  8.9 - 10.3 mg/dL 9.3  9.4  9.1       Latest Ref Rng & Units 05/27/2023    8:16 AM 02/23/2023    8:51 AM 11/30/2022    8:45 AM  CBC  WBC 4.0 - 10.5 K/uL 6.0  5.0  6.1   Hemoglobin 13.0 - 17.0 g/dL 86.2  86.8  87.5   Hematocrit 39.0 - 52.0 % 40.6  37.7  36.2   Platelets 150 - 400 K/uL 169  163  172    Lab Results  Component Value Date   HGBA1C 5.2 05/13/2021    Lab Results  Component Value Date   TSH 3.328 10/26/2022    External Labs:  Labs 09/25/2022:  Total cholesterol 129, triglycerides 93, HDL 50, LDL 61.  ROS  Review of Systems  Cardiovascular:  Positive for dyspnea on exertion (chronic and stable). Negative for chest pain, leg swelling, palpitations and paroxysmal nocturnal dyspnea.    Physical Exam:   VS:  BP 126/62 (BP Location: Left Arm, Patient Position: Sitting, Cuff Size: Normal)   Pulse 92   Resp 16  Ht 5' 8 (1.727 m)   Wt 169 lb 3.2 oz (76.7 kg)   SpO2 97%   BMI 25.73 kg/m    Wt Readings from Last 3 Encounters:  08/24/23 169 lb 3.2 oz (76.7 kg)  07/16/23 173 lb (78.5 kg)  06/02/23 176 lb 11.2 oz (80.2 kg)    Physical Exam Neck:     Vascular: No carotid bruit or JVD.  Cardiovascular:     Rate and Rhythm: Normal rate and regular rhythm.     Pulses: Intact distal pulses.     Heart sounds: Normal heart sounds. No murmur heard.    No gallop.  Pulmonary:     Effort: Pulmonary effort is normal.     Breath sounds: Examination of the left-lower field reveals rhonchi. Rhonchi present.  Abdominal:     General: Bowel sounds are normal.     Palpations: Abdomen is soft.  Musculoskeletal:     Right lower leg: No edema.     Left lower leg: No edema.    Studies Reviewed: SABRA    Left Heart Catheterization 05/06/21:     CABG x3 05/13/2021:  LIMA to LAD, reverse SVG to OM- 1 and left  PDA.   Echocardiogram 05/07/2021:  1. Left ventricular ejection fraction, by estimation, is 40 to 45%. The left ventricle has mildly decreased function. The left ventricle demonstrates global hypokinesis. Left ventricular diastolic parameters were normal.  EKG:    EKG Interpretation Date/Time:  Tuesday Aug 24 2023 13:56:17 EDT Ventricular Rate:  67 PR Interval:  128 QRS Duration:  82 QT Interval:  426 QTC Calculation: 450 R Axis:   42  Text Interpretation: EKG 08/24/2023: Normal sinus rhythm at rate of 67 bpm, normal axis, poor R wave progression, cannot exclude anterior infarct old.  Nonspecific high lateral T abnormality.  Compared to 07/06/2022, PACs no longer present. Confirmed by Elenore Wanninger, Jagadeesh ([id number]) on 08/24/2023 2:12:03 PM    Medications and allergies    Allergies  Allergen Reactions   Atorvastatin  Cough     Current Outpatient Medications:    acetaminophen  (TYLENOL ) 500 MG tablet, Take 500 mg by mouth daily as needed for moderate pain, fever or headache., Disp: , Rfl:    aspirin  EC 81 MG tablet, Take 81 mg by mouth daily., Disp: , Rfl:    Calcium  Carbonate-Vit D-Min (CALCIUM  1200 PO), Take by mouth., Disp: , Rfl:    Cholecalciferol (D3 5000 PO), Take by mouth., Disp: , Rfl:    metoprolol  tartrate (LOPRESSOR ) 25 MG tablet, TAKE 1 TABLET BY MOUTH TWICE A DAY (Patient taking differently: Take 12.5 mg by mouth 2 (two) times daily.), Disp: 180 tablet, Rfl: 3   nitroGLYCERIN  (NITROSTAT ) 0.4 MG SL tablet, Place 0.4 mg under the tongue every 5 (five) minutes as needed for chest pain., Disp: , Rfl:    pantoprazole  (PROTONIX ) 40 MG tablet, Take 1 tablet (40 mg total) by mouth daily., Disp: 30 tablet, Rfl: 2   rosuvastatin  (CRESTOR ) 20 MG tablet, Take 1 tablet (20 mg total) by mouth daily., Disp: 90 tablet, Rfl: 0   tadalafil  (CIALIS ) 20 MG tablet, Take one 20 mg tablet at least 60 minutes prior to anticipated sexual activity to achieve erection. Do not use more than once per day.  (Patient taking differently: Take one 20 mg tablet at least 60 minutes prior to anticipated sexual activity to achieve erection. Do not use more than once per day.  As needed), Disp: 20 tablet, Rfl: 1   No orders of the  defined types were placed in this encounter.    There are no discontinued medications.   ASSESSMENT AND PLAN: .      ICD-10-CM   1. Coronary artery disease involving native coronary artery of native heart without angina pectoris  I25.10 EKG 12-Lead    2. Hypercholesteremia  E78.00     3. Stage 3a chronic kidney disease (HCC)  N18.31       Assessment and Plan Assessment & Plan Coronary artery disease   Coronary artery disease is well-managed with no chest pain. He is on aspirin  and metoprolol . Continue aspirin  81 mg daily and metoprolol  tartrate 25 mg twice daily.  He is also on high intensity statin with rosuvastatin  20 mg daily, external labs reviewed, he is due for his complete physical examination with his PCP next month.  Previously his lipids have been very well-controlled and at goal.  Dyspnea on exertion/emphysema   Emphysema causes exertional dyspnea, with smoking and radiation therapy for lung cancer contributing to lung damage. A persistent dry cough is likely due to radiation-induced scar tissue. Discontinue pantoprazole  as it did not improve the cough. Consider Tessalon  for cough management.  Lung cancer   No signs of recurrence on recent CT scan. Lung cancer contributes to current pulmonary issues.  Stage 3A chronic kidney disease   Stage 3A chronic kidney disease with a GFR of 54 mL/min. Kidney function is mildly reduced but stable. Start losartan  12.5 mg in the evening to protect kidney function. OV in 1 year  Signed,  Gordy Bergamo, MD, [facility] 08/24/2023, 2:19 PM [facility] [address] [place], [place] [address] Phone: [phone]. Fax:  [phone]  "

## 2023-08-24 NOTE — Patient Instructions (Signed)
 Medication Instructions:  Your physician has recommended you make the following change in your medication: Start losartan 12.5 mg by mouth daily in the evening  *If you need a refill on your cardiac medications before your next appointment, please call your pharmacy*  Lab Work: none If you have labs (blood work) drawn today and your tests are completely normal, you will receive your results only by: MyChart Message (if you have MyChart) OR A paper copy in the mail If you have any lab test that is abnormal or we need to change your treatment, we will call you to review the results.  Testing/Procedures: none  Follow-Up: At [facility], you and your health needs are our priority.  As part of our continuing mission to provide you with exceptional heart care, our providers are all part of one team.  This team includes your primary Cardiologist (physician) and Advanced Practice Providers or APPs (Physician Assistants and Nurse Practitioners) who all work together to provide you with the care you need, when you need it.  Your next appointment:   12 month(s)  Provider:   Knox Perl, MD    We recommend signing up for the patient portal called "MyChart".  Sign up information is provided on this After Visit Summary.  MyChart is used to connect with patients for Virtual Visits (Telemedicine).  Patients are able to view lab/test results, encounter notes, upcoming appointments, etc.  Non-urgent messages can be sent to your provider as well.   To learn more about what you can do with MyChart, go to [email or web address].   Other Instructions

## 2023-08-25 ENCOUNTER — Encounter: Payer: Self-pay | Admitting: Cardiology

## 2023-08-29 ENCOUNTER — Other Ambulatory Visit: Payer: Self-pay | Admitting: Cardiology

## 2023-08-29 DIAGNOSIS — I2510 Atherosclerotic heart disease of native coronary artery without angina pectoris: Secondary | ICD-10-CM

## 2023-08-29 DIAGNOSIS — E7800 Pure hypercholesterolemia, unspecified: Secondary | ICD-10-CM

## 2023-09-09 ENCOUNTER — Encounter: Payer: Self-pay | Admitting: Adult Health

## 2023-09-09 ENCOUNTER — Telehealth: Payer: Self-pay

## 2023-09-09 ENCOUNTER — Other Ambulatory Visit: Payer: Self-pay | Admitting: Adult Health

## 2023-09-09 ENCOUNTER — Ambulatory Visit: Admitting: Adult Health

## 2023-09-09 ENCOUNTER — Encounter: Payer: Self-pay | Admitting: Physician Assistant

## 2023-09-09 ENCOUNTER — Other Ambulatory Visit (HOSPITAL_COMMUNITY): Payer: Self-pay

## 2023-09-09 VITALS — BP 122/76 | HR 60 | Temp 97.6°F | Ht 70.0 in | Wt 171.4 lb

## 2023-09-09 DIAGNOSIS — R053 Chronic cough: Secondary | ICD-10-CM | POA: Diagnosis not present

## 2023-09-09 DIAGNOSIS — Z87891 Personal history of nicotine dependence: Secondary | ICD-10-CM | POA: Diagnosis not present

## 2023-09-09 DIAGNOSIS — J439 Emphysema, unspecified: Secondary | ICD-10-CM | POA: Diagnosis not present

## 2023-09-09 DIAGNOSIS — C3491 Malignant neoplasm of unspecified part of right bronchus or lung: Secondary | ICD-10-CM

## 2023-09-09 HISTORY — DX: Emphysema, unspecified: J43.9

## 2023-09-09 MED ORDER — STIOLTO RESPIMAT 2.5-2.5 MCG/ACT IN AERS
2.0000 | INHALATION_SPRAY | Freq: Every day | RESPIRATORY_TRACT | Status: AC
Start: 2023-09-09 — End: ?

## 2023-09-09 MED ORDER — STIOLTO RESPIMAT 2.5-2.5 MCG/ACT IN AERS: 2.0000 | INHALATION_SPRAY | Freq: Every day | RESPIRATORY_TRACT | 5 refills | Status: AC

## 2023-09-09 NOTE — Telephone Encounter (Signed)
 Please advise of formulary alternative for Stiolto. Thanks.

## 2023-09-09 NOTE — Assessment & Plan Note (Signed)
 Chronic cough-in the setting of underlying COPD/emphysema previous radiation.  I have encouraged him to continue to treat cough with cough suppressant such as Delsym and Tessalon  Perles control for triggers of reflux and chronic rhinitis.  Will try Stiolto.  Check PFTs on return.  Plan  Patient Instructions  Begin Stiolto 2 puffs daily.  Albuterol  inhaler or neb As needed   Delsym 2 tsp Twice daily  for cough , As needed   Tessalon  Three times a day  for cough As needed   Claritin 10mg  daily As needed   May use Chlorpheniramine 4mg  every 6hr as needed for drainage /cough -may make you sleepy use caution  Sips of water  to soothe throat , avoid throat clearing . NO MINTS  Continue on Protonix  daily  Follow up with Dr. Baldwin Levee  or Kellie Chisolm NP 3 months with PFT  and As needed   Please contact office for sooner follow up if symptoms do not improve or worsen or seek emergency care   n

## 2023-09-09 NOTE — Progress Notes (Signed)
 @Patient  ID: Kevin [name], male    DOB: [birth year], 83 y.o.   MRN: [id number]  Chief Complaint  Patient presents with   Follow-up    Dry Cough    Referring provider: Orlena Bitters, MD  HPI: 83 yo male former smoker followed for Emphysema and Stage IIIb non small cell lung cancer s/p chemo/radiation , Immunotherapy and wedge resection -ongoing surveillance.  Medical history significant for CAD s/p CABG, CHF, bladder cancer  TEST/EVENTS :  07/07/2021 nuclear medicine pet imaging: Hypermetabolic adenopathy within the mediastinum, right upper lobe hypermetabolic lesion. Concerning for advanced age bronchogenic carcinoma. The patient's images have been independently reviewed by me.     CT chest October 2023: Right upper lobe pulmonary nodule adjacent to right upper lobe wedge resection relatively unchanged.  Has some linear areas of groundglass opacity consistent with his radiation changes.  Mild inflammatory changes within the left lower lobe.  No other new nodules,.  No evidence of metastatic disease.  09/09/2023 Follow up : Emphysema and Lung Cancer, Chronic cough  Patient returns for a 3-month follow-up.  Patient has underlying emphysema.  Has not had PFTs to date.  Previously tried Breztri  inhaler very briefly a couple years ago but does not remember if it made any difference in his breathing or cough.  Complains of an ongoing daily cough that waxes and wanes.  Has been going on for greater than 2 years.  He has been tried on several different cough regimens including Delsym, Tessalon , Allegra, chlor tabs, Protonix , Pepcid .  Patient says he takes these things briefly but does not see any significant improvement and goes back to just using cough drops.  Patient says currently his cough is manageable with cough drops some days is better than others.  He denies any fever, discolored mucus, chest pain.  Says he does feel his cough is worse when he is outside working.  Patient says overall he  he does [name] his yard work but otherwise is not very active does not exercise or walk on a regular basis.  He denies any hemoptysis or unintentional weight loss. As above he continues to follow with oncology.  Has a history of stage IIIb non-small cell lung cancer status post wedge resection, chemoradiation and immunotherapy.  Currently under surveillance. Most recent CT chest May 27, 2023 that showed stable posttherapy changes with no new findings of recurrence.  Stable emphysema.  Patient has an upcoming surveillance CT chest in June 2025.   Allergies  Allergen Reactions   Atorvastatin  Cough    Immunization History  Administered Date(s) Administered   Influenza, High Dose Seasonal PF 01/06/2017, 01/17/2018, 01/04/2019    Past Medical History:  Diagnosis Date   Coronary artery disease    CABG x 3 on 05/13/21, Follows w/ [facility].   Emphysema lung (HCC) 09/09/2023   History of immunotherapy    Imfinzi  1500 mg every 4 weeks, began 10/27/21.   Hx of bladder cancer    s/p multiple TURBT, follows w/ Dr. Joie Narrow   Hyperlipidemia    Lung cancer (HCC)    RUL lung mass, s/p right uppler lobe wedge resection, Stage IIIc, non-small cell lung cancer, adenocarcinoma, Follows with [facility].   SCC (squamous cell carcinoma) 05/28/2004   top of left hand curet x3 52fu   SCC (squamous cell carcinoma) 09/13/2013   mid back tx cx3 79fu   SCC (squamous cell carcinoma) 09/06/2018   left wrist tx with bx   Squamous cell carcinoma of  skin 11/16/2003   left ear tip tx curet and cautery 28fu   Status post chemoradiation    Concurrent chemoradiation with weekly carboplatin  for AUC of 2 and paclitaxel  45 mg/M2. First dose 08/11/2021. Status post 6 cycles. Last dose 09/22/21.    Tobacco History: Social History   Tobacco Use  Smoking Status Former   Current packs/day: 0.00   Average packs/day: 1 pack/day for 30.0 years (30.0 ttl pk-yrs)   Types: Cigarettes   Start date:  04/13/1965   Quit date: 04/14/1995   Years since quitting: 28.4  Smokeless Tobacco Never   Counseling given: Not Answered   Outpatient Medications Prior to Visit  Medication Sig Dispense Refill   acetaminophen  (TYLENOL ) 500 MG tablet Take 500 mg by mouth daily as needed for moderate pain, fever or headache.     aspirin  EC 81 MG tablet Take 81 mg by mouth daily.     Calcium  Carbonate-Vit D-Min (CALCIUM  1200 PO) Take by mouth.     Cholecalciferol (D3 5000 PO) Take by mouth.     losartan  (COZAAR ) 25 MG tablet Take 0.5 tablets (12.5 mg total) by mouth every evening. 45 tablet 3   metoprolol  tartrate (LOPRESSOR ) 25 MG tablet TAKE 1 TABLET BY MOUTH TWICE A DAY (Patient taking differently: Take 12.5 mg by mouth 2 (two) times daily.) 180 tablet 3   nitroGLYCERIN  (NITROSTAT ) 0.4 MG SL tablet Place 0.4 mg under the tongue every 5 (five) minutes as needed for chest pain.     pantoprazole  (PROTONIX ) 40 MG tablet Take 1 tablet (40 mg total) by mouth daily. 30 tablet 2   rosuvastatin  (CRESTOR ) 20 MG tablet TAKE 1 TABLET BY MOUTH EVERY DAY 90 tablet 3   tadalafil  (CIALIS ) 20 MG tablet Take one 20 mg tablet at least 60 minutes prior to anticipated sexual activity to achieve erection. [name] not use more than once per day. (Patient taking differently: Take one 20 mg tablet at least 60 minutes prior to anticipated sexual activity to achieve erection. [name] not use more than once per day.  As needed) 20 tablet 1   No facility-administered medications prior to visit.     Review of Systems:   Constitutional:   No  weight loss, night sweats,  Fevers, chills, +fatigue, or  lassitude.  HEENT:   No headaches,  Difficulty swallowing,  Tooth/dental problems, or  Sore throat,                No sneezing, itching, ear ache, nasal congestion, post nasal drip,   CV:  No chest pain,  Orthopnea, PND, swelling in lower extremities, anasarca, dizziness, palpitations, syncope.   GI  No heartburn, indigestion, abdominal pain,  nausea, vomiting, diarrhea, change in bowel habits, loss of appetite, bloody stools.   Resp:   No chest wall deformity  Skin: no rash or lesions.  GU: no dysuria, change in color of urine, no urgency or frequency.  No flank pain, no hematuria   MS:  No joint pain or swelling.  No decreased range of motion.  No back pain.    Physical Exam  BP 122/76 (BP Location: Left Arm, Patient Position: Sitting, Cuff Size: Normal)   Pulse 60   Temp 97.6 F (36.4 C) (Oral)   Ht 5\' 10"  (1.778 m)   Wt 171 lb 6.4 oz (77.7 kg)   SpO2 96%   BMI 24.59 kg/m   GEN: A/Ox3; pleasant , NAD, well nourished    HEENT:  [place]/AT,   NOSE-clear, THROAT-clear, no  lesions, no postnasal drip or exudate noted.   NECK:  Supple w/ fair ROM; no JVD; normal carotid impulses w/o bruits; no thyromegaly or nodules palpated; no lymphadenopathy.    RESP  Clear  P & A; w/o, wheezes/ rales/ or rhonchi. no accessory muscle use, no dullness to percussion  CARD:  RRR, no m/r/g, no peripheral edema, pulses intact, no cyanosis or clubbing.  GI:   Soft & nt; nml bowel sounds; no organomegaly or masses detected.   Musco: Warm bil, no deformities or joint swelling noted.   Neuro: alert, no focal deficits noted.    Skin: Warm, no lesions or rashes    Lab Results:   BMET   BNP   ProBNP No results found for: "PROBNP"  Imaging: No results found.  Administration History     None           No data to display          No results found for: "NITRICOXIDE"      Assessment & Plan:   Emphysema lung (HCC) Emphysema-check PFTs on return visit.  Trial of Stiolto 2 puffs daily until return visit. If no perceived benefit and/or reduction in cough can discontinue  Plan  Patient Instructions  Begin Stiolto 2 puffs daily.  Albuterol  inhaler or neb As needed   Delsym 2 tsp Twice daily  for cough , As needed   Tessalon  Three times a day  for cough As needed   Claritin 10mg  daily As needed   May use  Chlorpheniramine 4mg  every 6hr as needed for drainage /cough -may make you sleepy use caution  Sips of water  to soothe throat , avoid throat clearing . NO MINTS  Continue on Protonix  daily  Follow up with Dr. Baldwin Levee  or Lenis Nettleton NP 3 months with PFT  and As needed   Please contact office for sooner follow up if symptoms [name] not improve or worsen or seek emergency care   n   Adenocarcinoma of right lung, stage III (HCC) Stage IIIb non-small cell lung cancer status post wedge resection, chemoradiation and immunotherapy.  Currently on surveillance with oncology.  CT chest February showed no evidence of recurrence stable posttherapy changes.  Has surveillance CT chest next month as planned with oncology  Chronic cough Chronic cough-in the setting of underlying COPD/emphysema previous radiation.  I have encouraged him to continue to treat cough with cough suppressant such as Delsym and Tessalon  Perles control for triggers of reflux and chronic rhinitis.  Will try Stiolto.  Check PFTs on return.  Plan  Patient Instructions  Begin Stiolto 2 puffs daily.  Albuterol  inhaler or neb As needed   Delsym 2 tsp Twice daily  for cough , As needed   Tessalon  Three times a day  for cough As needed   Claritin 10mg  daily As needed   May use Chlorpheniramine 4mg  every 6hr as needed for drainage /cough -may make you sleepy use caution  Sips of water  to soothe throat , avoid throat clearing . NO MINTS  Continue on Protonix  daily  Follow up with Dr. Baldwin Levee  or Dacotah Cabello NP 3 months with PFT  and As needed   Please contact office for sooner follow up if symptoms [name] not improve or worsen or seek emergency care   n     Roena Clark, NP 09/09/2023

## 2023-09-09 NOTE — Assessment & Plan Note (Signed)
 Emphysema-check PFTs on return visit.  Trial of Stiolto 2 puffs daily until return visit. If no perceived benefit and/or reduction in cough can discontinue  Plan  Patient Instructions  Begin Stiolto 2 puffs daily.  Albuterol  inhaler or neb As needed   Delsym 2 tsp Twice daily  for cough , As needed   Tessalon  Three times a day  for cough As needed   Claritin 10mg  daily As needed   May use Chlorpheniramine 4mg  every 6hr as needed for drainage /cough -may make you sleepy use caution  Sips of water  to soothe throat , avoid throat clearing . NO MINTS  Continue on Protonix  daily  Follow up with Dr. Baldwin Levee  or Calab Sachse NP 3 months with PFT  and As needed   Please contact office for sooner follow up if symptoms do not improve or worsen or seek emergency care   n

## 2023-09-09 NOTE — Progress Notes (Signed)
Patient seen in the office today and instructed on use of Stiolto.  Patient expressed understanding and demonstrated technique. 

## 2023-09-09 NOTE — Telephone Encounter (Signed)
 Per test claims preferred alternatives are:   Anoro Ellipta: $120.24 Bevespi: [id number]  Patient may have a deductible to meet causing these higher prices.   *sent to office in original message

## 2023-09-09 NOTE — Patient Instructions (Addendum)
 Begin Stiolto 2 puffs daily.  Albuterol  inhaler or neb As needed   Delsym 2 tsp Twice daily  for cough , As needed   Tessalon  Three times a day  for cough As needed   Claritin 10mg  daily As needed   May use Chlorpheniramine 4mg  every 6hr as needed for drainage /cough -may make you sleepy use caution  Sips of water  to soothe throat , avoid throat clearing . NO MINTS  Continue on Protonix  daily  Follow up with Dr. Baldwin Levee  or Rebel Willcutt NP 3 months with PFT  and As needed   Please contact office for sooner follow up if symptoms do not improve or worsen or seek emergency care

## 2023-09-09 NOTE — Telephone Encounter (Signed)
 Per test claims preferred alternatives are:   Anoro Ellipta: $120.24 Bevespi: [id number]  Patient may have a deductible to meet causing these higher prices.

## 2023-09-09 NOTE — Assessment & Plan Note (Signed)
 Stage IIIb non-small cell lung cancer status post wedge resection, chemoradiation and immunotherapy.  Currently on surveillance with oncology.  CT chest February showed no evidence of recurrence stable posttherapy changes.  Has surveillance CT chest next month as planned with oncology

## 2023-09-14 ENCOUNTER — Other Ambulatory Visit (HOSPITAL_BASED_OUTPATIENT_CLINIC_OR_DEPARTMENT_OTHER): Payer: Self-pay

## 2023-09-14 ENCOUNTER — Telehealth (HOSPITAL_BASED_OUTPATIENT_CLINIC_OR_DEPARTMENT_OTHER): Payer: Self-pay

## 2023-09-14 DIAGNOSIS — J449 Chronic obstructive pulmonary disease, unspecified: Secondary | ICD-10-CM

## 2023-09-14 MED ORDER — ALBUTEROL SULFATE (2.5 MG/3ML) 0.083% IN NEBU: 2.5000 mg | INHALATION_SOLUTION | Freq: Four times a day (QID) | RESPIRATORY_TRACT | 11 refills | Status: AC | PRN

## 2023-09-14 MED ORDER — ALBUTEROL SULFATE HFA 108 (90 BASE) MCG/ACT IN AERS: 1.0000 | INHALATION_SPRAY | Freq: Four times a day (QID) | RESPIRATORY_TRACT | 3 refills | Status: AC | PRN

## 2023-09-14 MED ORDER — BENZONATATE 200 MG PO CAPS: 200.0000 mg | ORAL_CAPSULE | Freq: Three times a day (TID) | ORAL | 2 refills | Status: AC | PRN

## 2023-09-14 NOTE — Telephone Encounter (Signed)
 Copied from CRM [id number]. Topic: Clinical - Prescription Issue >> Sep 10, 2023 10:25 AM Evie Hoff wrote: Reason for CRM: patient is calling cause 3 prescriptions were not called in  Stiolto Albuterol  tessalon  Patient is saying that pharmacy say that dr didn't sign off on prescription stiolto and the other 2 were never sent in  [id number] - [place], [place] - [address] [place] [address] Phone: [phone] Fax: [phone] Hours: Not open 24 hours

## 2023-09-15 NOTE — Telephone Encounter (Signed)
 Please advise on alternatives for Stiolto inhaler. Thank you.

## 2023-09-16 NOTE — Telephone Encounter (Signed)
 Check to see if these are affordable for patient

## 2023-09-16 NOTE — Telephone Encounter (Signed)
 Spoke with pt he has picked up all rxs he said he paid out of pocket for Stiolto and he can continue to do that for now

## 2023-09-20 ENCOUNTER — Other Ambulatory Visit: Payer: Self-pay | Admitting: Adult Health

## 2023-09-20 NOTE — Telephone Encounter (Signed)
**Note De-identified  [place] Obfuscation** Please advise 

## 2023-09-30 ENCOUNTER — Ambulatory Visit (HOSPITAL_COMMUNITY)
Admission: RE | Admit: 2023-09-30 | Discharge: 2023-09-30 | Disposition: A | Discharge status: Home / Self Care | Payer: Medicare HMO | Source: Ambulatory Visit | Attending: Internal Medicine | Admitting: Internal Medicine

## 2023-09-30 ENCOUNTER — Inpatient Hospital Stay: Payer: Medicare HMO | Attending: Internal Medicine

## 2023-09-30 ENCOUNTER — Encounter (HOSPITAL_COMMUNITY): Payer: Self-pay

## 2023-09-30 DIAGNOSIS — Z9221 Personal history of antineoplastic chemotherapy: Secondary | ICD-10-CM | POA: Insufficient documentation

## 2023-09-30 DIAGNOSIS — Z8551 Personal history of malignant neoplasm of bladder: Secondary | ICD-10-CM | POA: Insufficient documentation

## 2023-09-30 DIAGNOSIS — R413 Other amnesia: Secondary | ICD-10-CM | POA: Insufficient documentation

## 2023-09-30 DIAGNOSIS — Z923 Personal history of irradiation: Secondary | ICD-10-CM | POA: Insufficient documentation

## 2023-09-30 DIAGNOSIS — J90 Pleural effusion, not elsewhere classified: Secondary | ICD-10-CM | POA: Diagnosis not present

## 2023-09-30 DIAGNOSIS — C3490 Malignant neoplasm of unspecified part of unspecified bronchus or lung: Secondary | ICD-10-CM

## 2023-09-30 DIAGNOSIS — Z85118 Personal history of other malignant neoplasm of bronchus and lung: Secondary | ICD-10-CM | POA: Diagnosis not present

## 2023-09-30 DIAGNOSIS — J439 Emphysema, unspecified: Secondary | ICD-10-CM | POA: Diagnosis not present

## 2023-09-30 DIAGNOSIS — I700 Atherosclerosis of aorta: Secondary | ICD-10-CM | POA: Diagnosis not present

## 2023-09-30 LAB — CMP (CANCER CENTER ONLY)
ALT: 7 U/L (ref 0–44)
AST: 11 U/L — ABNORMAL LOW (ref 15–41)
Albumin: 4.1 g/dL (ref 3.5–5.0)
Alkaline Phosphatase: 67 U/L (ref 38–126)
Anion gap: 5 (ref 5–15)
BUN: 19 mg/dL (ref 8–23)
CO2: 29 mmol/L (ref 22–32)
Calcium: 9.4 mg/dL (ref 8.9–10.3)
Chloride: 103 mmol/L (ref 98–111)
Creatinine: 1.47 mg/dL — ABNORMAL HIGH (ref 0.61–1.24)
GFR, Estimated: 48 mL/min — ABNORMAL LOW (ref >60)
Glucose, Bld: 98 mg/dL (ref 70–99)
Potassium: 4.6 mmol/L (ref 3.5–5.1)
Sodium: 137 mmol/L (ref 135–145)
Total Bilirubin: 0.8 mg/dL (ref 0.0–1.2)
Total Protein: 7.6 g/dL (ref 6.5–8.1)

## 2023-09-30 LAB — CBC WITH DIFFERENTIAL (CANCER CENTER ONLY)
Abs Immature Granulocytes: 0.03 10*3/uL (ref 0.00–0.07)
Basophils Absolute: 0.1 10*3/uL (ref 0.0–0.1)
Basophils Relative: 1 %
Eosinophils Absolute: 0.3 10*3/uL (ref 0.0–0.5)
Eosinophils Relative: 5 %
HCT: 38.7 % — ABNORMAL LOW (ref 39.0–52.0)
Hemoglobin: 12.9 g/dL — ABNORMAL LOW (ref 13.0–17.0)
Immature Granulocytes: 1 %
Lymphocytes Relative: 24 %
Lymphs Abs: 1.5 10*3/uL (ref 0.7–4.0)
MCH: 30.6 pg (ref 26.0–34.0)
MCHC: 33.3 g/dL (ref 30.0–36.0)
MCV: 91.7 fL (ref 80.0–100.0)
Monocytes Absolute: 0.7 10*3/uL (ref 0.1–1.0)
Monocytes Relative: 12 %
Neutro Abs: 3.7 10*3/uL (ref 1.7–7.7)
Neutrophils Relative %: 57 %
Platelet Count: 176 10*3/uL (ref 150–400)
RBC: 4.22 MIL/uL (ref 4.22–5.81)
RDW: 13.4 % (ref 11.5–15.5)
WBC Count: 6.2 10*3/uL (ref 4.0–10.5)
nRBC: 0 % (ref 0.0–0.2)

## 2023-10-07 ENCOUNTER — Inpatient Hospital Stay: Payer: Medicare HMO | Admitting: Internal Medicine

## 2023-10-07 DIAGNOSIS — C3490 Malignant neoplasm of unspecified part of unspecified bronchus or lung: Secondary | ICD-10-CM | POA: Diagnosis not present

## 2023-10-07 NOTE — Progress Notes (Signed)
 [facility] [phone]   [phone]  PROGRESS NOTE FOR TELEMEDICINE VISITS  Kevin Leta NOVAK, MD [address] [place] [address]  I connected withNAME@ on 10/07/23 at 10:00 AM EDT by video enabled telemedicine visit and verified that I am speaking with the correct person using two identifiers.   I discussed the limitations, risks, security and privacy concerns of performing an evaluation and management service by telemedicine and the availability of in-person appointments. I also discussed with the patient that there may be a patient responsible charge related to this service. The patient expressed understanding and agreed to proceed.  Other persons participating in the visit and their role in the encounter:  wife  Patient's location:  Home Provider's location: [facility]  DIAGNOSIS: Stage IIIC (T3, N3, M0) non-small cell lung cancer, adenocarcinoma presented with right upper lobe lung mass status post right upper lobe wedge resection but no lymph node dissection.  The tumor measured 3.3 cm with visceral pleural involvement and very close resection margin less than 0.1 cm from the tumor.  This was performed on May 13, 2021 in the same setting of CABG under the care of Dr. Shyrl.  PET scan performed in March 2023 showed a right upper lobe nodule adjacent to the wedge resection site small hypermetabolic right hilar, right subcarinal, and left infrahilar lymph nodes which could be reactive but are suspicious for nodal involvement.   Molecular studies showed no actionable mutations and PD-L1 expression was 0%   PRIOR THERAPY:  1) Right upper lobe wedge resection under the care of Dr. Shyrl on 05/13/2021. 2) Concurrent chemoradiation with weekly carboplatin  for AUC of 2 and paclitaxel  45 Mg/M2.  First dose Aug 11, 2021.  Status post 6 cycles.  Last dose was given September 22, 2021. 3) Consolidation treatment with immunotherapy with Imfinzi   1500 Mg IV every 4 weeks.  First dose October 27, 2021.  Status post 13 cycles.   CURRENT THERAPY: Observation.  INTERVAL HISTORY: Kevin [name] 83 y.o. male has been MyChart video virtual visit with me today for evaluation and discussion of his discuss results.Discussed the use of AI scribe software for clinical note transcription with the patient, who gave verbal consent to proceed.  History of Present Illness   Kevin [name] is an 83 year old male who presents for a virtual video visit to discuss scan results. He is accompanied by his wife.  He is currently under observation following the completion of treatment for cancer. His treatment regimen included chemoradiation with weekly carboplatin  and paclitaxel , followed by one year of consolidation treatment with immunotherapy using Imfinzi  (durvalumab ), which concluded in June 2024.  He feels generally well, stating, 'I feel fine, really. I do what I want and then I sleep good.' However, he is experiencing issues with short-term memory, which his wife has also noticed. He describes his memory as 'the shot' and is concerned about forgetting things. He inquires if there might be any medication that could help with his memory issues.  He mentions feeling tired, particularly after working on fixing a water  pump at home, which had a break in the wire. This issue was resolved with the help of a repairman, and his water  supply is now back to normal.  No new complaints since the last visit, aside from memory issues. No other symptoms or concerns.       MEDICAL HISTORY: Past Medical History:  Diagnosis Date   Coronary artery disease    CABG  x 3 on 05/13/21, Follows w/ [facility].   Emphysema lung (HCC) 09/09/2023   History of immunotherapy    Imfinzi  1500 mg every 4 weeks, began 10/27/21.   Hx of bladder cancer    s/p multiple TURBT, follows w/ Dr. Matilda   Hyperlipidemia    Lung cancer (HCC)    RUL lung mass, s/p right uppler  lobe wedge resection, Stage IIIc, non-small cell lung cancer, adenocarcinoma, Follows with [facility].   SCC (squamous cell carcinoma) 05/28/2004   top of left hand curet x3 33fu   SCC (squamous cell carcinoma) 09/13/2013   mid back tx cx3 18fu   SCC (squamous cell carcinoma) 09/06/2018   left wrist tx with bx   Squamous cell carcinoma of skin 11/16/2003   left ear tip tx curet and cautery 35fu   Status post chemoradiation    Concurrent chemoradiation with weekly carboplatin  for AUC of 2 and paclitaxel  45 mg/M2. First dose 08/11/2021. Status post 6 cycles. Last dose 09/22/21.    ALLERGIES:  is allergic to atorvastatin .  MEDICATIONS:  Current Outpatient Medications  Medication Sig Dispense Refill   acetaminophen  (TYLENOL ) 500 MG tablet Take 500 mg by mouth daily as needed for moderate pain, fever or headache.     albuterol  (PROVENTIL ) (2.5 MG/3ML) 0.083% nebulizer solution Take 3 mLs (2.5 mg total) by nebulization every 6 (six) hours as needed for wheezing or shortness of breath. 360 mL 11   albuterol  (VENTOLIN  HFA) 108 (90 Base) MCG/ACT inhaler Inhale 1-2 puffs into the lungs every 6 (six) hours as needed for wheezing or shortness of breath. 18 g 3   aspirin  EC 81 MG tablet Take 81 mg by mouth daily.     benzonatate  (TESSALON ) 200 MG capsule Take 1 capsule (200 mg total) by mouth 3 (three) times daily as needed. 45 capsule 2   Calcium  Carbonate-Vit D-Min (CALCIUM  1200 PO) Take by mouth.     Cholecalciferol (D3 5000 PO) Take by mouth.     losartan  (COZAAR ) 25 MG tablet Take 0.5 tablets (12.5 mg total) by mouth every evening. 45 tablet 3   metoprolol  tartrate (LOPRESSOR ) 25 MG tablet TAKE 1 TABLET BY MOUTH TWICE A DAY (Patient taking differently: Take 12.5 mg by mouth 2 (two) times daily.) 180 tablet 3   nitroGLYCERIN  (NITROSTAT ) 0.4 MG SL tablet Place 0.4 mg under the tongue every 5 (five) minutes as needed for chest pain.     pantoprazole  (PROTONIX ) 40 MG tablet Take 1 tablet (40  mg total) by mouth daily. 30 tablet 2   rosuvastatin  (CRESTOR ) 20 MG tablet TAKE 1 TABLET BY MOUTH EVERY DAY 90 tablet 3   tadalafil  (CIALIS ) 20 MG tablet Take one 20 mg tablet at least 60 minutes prior to anticipated sexual activity to achieve erection. Do not use more than once per day. (Patient taking differently: Take one 20 mg tablet at least 60 minutes prior to anticipated sexual activity to achieve erection. Do not use more than once per day.  As needed) 20 tablet 1   Tiotropium Bromide-Olodaterol (STIOLTO RESPIMAT ) 2.5-2.5 MCG/ACT AERS Inhale 2 puffs into the lungs daily. 1 each 5   Tiotropium Bromide-Olodaterol (STIOLTO RESPIMAT ) 2.5-2.5 MCG/ACT AERS Inhale 2 puffs into the lungs daily.     No current facility-administered medications for this visit.    SURGICAL HISTORY:  Past Surgical History:  Procedure Laterality Date   BRONCHIAL BIOPSY  07/22/2021   Procedure: BRONCHIAL BIOPSIES;  Surgeon: Shelah Lamar RAMAN, MD;  Location:  [facility];  Service: Cardiopulmonary;;   BRONCHIAL BRUSHINGS  07/22/2021   Procedure: BRONCHIAL BRUSHINGS;  Surgeon: Shelah Lamar RAMAN, MD;  Location: [facility];  Service: Cardiopulmonary;;   BRONCHIAL NEEDLE ASPIRATION BIOPSY  07/22/2021   Procedure: BRONCHIAL NEEDLE ASPIRATION BIOPSIES;  Surgeon: Shelah Lamar RAMAN, MD;  Location: [facility];  Service: Cardiopulmonary;;   CHOLECYSTECTOMY N/A 02/15/2022   Procedure: LAPAROSCOPIC CHOLECYSTECTOMY;  Surgeon: Kallie Manuelita BROCKS, MD;  Location: [facility];  Service: General;  Laterality: N/A;   CORONARY ARTERY BYPASS GRAFT N/A 05/13/2021   Procedure: CORONARY ARTERY BYPASS GRAFTING (CABG) TIMES 3  , ON PUMP, USING LEFT INTERNAL MAMMARY ARTERY AND RIGHT GREATER SAPHENOUS VEIN HARVESTED ENDOSCOPICALLY;  Surgeon: Shyrl Linnie KIDD, MD;  Location: [facility] OR;  Service: Open Heart Surgery;  Laterality: N/A;   CYSTOSCOPY WITH LITHOLAPAXY N/A 12/10/2022   Procedure: CYSTOSCOPY WITH LITHOLAPAXY and FULGURATION;  Surgeon: Matilda Senior, MD;  Location: [facility];  Service: Urology;  Laterality: N/A;   ENDOVEIN HARVEST OF GREATER SAPHENOUS VEIN Right 05/13/2021   Procedure: ENDOVEIN HARVEST OF GREATER SAPHENOUS VEIN;  Surgeon: Shyrl Linnie KIDD, MD;  Location: [facility];  Service: Open Heart Surgery;  Laterality: Right;   epidural     back pain   HOLMIUM LASER APPLICATION N/A 12/10/2022   Procedure: HOLMIUM LASER APPLICATION;  Surgeon: Matilda Senior, MD;  Location: [facility];  Service: Urology;  Laterality: N/A;   IR CT SPINE LTD  06/18/2020   IR KYPHO LUMBAR INC FX REDUCE BONE BX UNI/BIL CANNULATION INC/IMAGING  06/18/2020   LEFT HEART CATH AND CORONARY ANGIOGRAPHY N/A 05/06/2021   Procedure: LEFT HEART CATH AND CORONARY ANGIOGRAPHY;  Surgeon: Ladona Heinz, MD;  [facility];  Service: Cardiovascular;  Laterality: N/A;   LUNG BIOPSY Right 05/13/2021   Procedure: RIGHT LUNG BIOPSY;  Surgeon: Shyrl Linnie KIDD, MD;  Location: [facility];  Service: Open Heart Surgery;  Laterality: Right;   MINOR HEMORRHOIDECTOMY     SHOULDER SURGERY     TEE WITHOUT CARDIOVERSION N/A 05/13/2021   Procedure: TRANSESOPHAGEAL ECHOCARDIOGRAM (TEE);  Surgeon: Shyrl Linnie KIDD, MD;  Location: [facility];  Service: Open Heart Surgery;  Laterality: N/A;   TRANSURETHRAL RESECTION OF BLADDER TUMOR  2014   TRANSURETHRAL RESECTION OF BLADDER TUMOR N/A 05/19/2018   Procedure: TRANSURETHRAL RESECTION OF BLADDER TUMOR (TURBT) AND (INTRAVESICAL GEMCITABINE  INSTILLED IN BLADDER IN PACU) ;  Surgeon: Matilda Senior, MD;  Location: [facility];  Service: Urology;  Laterality: N/A;  30 MINS   VIDEO BRONCHOSCOPY WITH ENDOBRONCHIAL ULTRASOUND Bilateral 07/22/2021   Procedure: VIDEO BRONCHOSCOPY WITH ENDOBRONCHIAL ULTRASOUND;  Surgeon: Shelah Lamar RAMAN, MD;  Location: [facility] ENDOSCOPY;  Service: Cardiopulmonary;  Laterality: Bilateral;    REVIEW OF SYSTEMS:  A comprehensive review of systems was negative except for:  Constitutional: positive for fatigue Neurological: positive for memory problems    LABORATORY DATA: Lab Results  Component Value Date   WBC 6.2 09/30/2023   HGB 12.9 (L) 09/30/2023   HCT 38.7 (L) 09/30/2023   MCV 91.7 09/30/2023   PLT 176 09/30/2023      Chemistry      Component Value Date/Time   NA 137 09/30/2023 1053   NA 139 05/01/2021 1146   K 4.6 09/30/2023 1053   CL 103 09/30/2023 1053   CO2 29 09/30/2023 1053   BUN 19 09/30/2023 1053   BUN 20 05/01/2021 1146   CREATININE 1.47 (H) 09/30/2023 1053      Component Value Date/Time  CALCIUM  9.4 09/30/2023 1053   ALKPHOS 67 09/30/2023 1053   AST 11 (L) 09/30/2023 1053   ALT 7 09/30/2023 1053   BILITOT 0.8 09/30/2023 1053       RADIOGRAPHIC STUDIES: CT Chest Wo Contrast Result Date: 10/07/2023 CLINICAL DATA:  Restaging non-small cell lung cancer. * Tracking Code: BO * EXAM: CT CHEST WITHOUT CONTRAST TECHNIQUE: Multidetector CT imaging of the chest was performed following the standard protocol without IV contrast. RADIATION DOSE REDUCTION: This exam was performed according to the departmental dose-optimization program which includes automated exposure control, adjustment of the mA and/or kV according to patient size and/or use of iterative reconstruction technique. COMPARISON:  05/27/2023 FINDINGS: Cardiovascular: Mild cardiac enlargement. Previous median sternotomy and CABG procedure. Aortic atherosclerosis. No pericardial effusion. Mediastinum/Nodes: No enlarged mediastinal or axillary lymph nodes. Thyroid  gland, trachea, and esophagus demonstrate no significant findings. Lungs/Pleura: Emphysema with diffuse bronchial wall thickening. Trace pleural fluid identified overlying the posterior right lung base, unchanged. Post treatment changes with masslike architectural distortion, fibrosis and volume loss is again noted within the apical portion of the right lung, paramediastinal right upper lobe and superior segment of right  lower lobe. Confluent fibrosis within the paramediastinal left upper lobe and left lower lobe is also unchanged in the interval. No signs of locally recurrent tumor or metastatic disease within the lungs. Upper Abdomen: No acute abnormality.  Status post cholecystectomy. Musculoskeletal: No chest wall mass or suspicious bone lesions identified. IMPRESSION: 1. Stable post treatment changes within the lungs. No signs of locally recurrent tumor or metastatic disease. 2. Trace pleural fluid overlying the posterior right lung base, unchanged. 3. Aortic Atherosclerosis (ICD10-I70.0) and Emphysema (ICD10-J43.9). Electronically Signed   By: Waddell Calk M.D.   On: 10/07/2023 08:13    ASSESSMENT AND PLAN: This is a very pleasant 83 years old white male diagnosed with stage IIIC (T3, N3, M0) non-small cell lung cancer, adenocarcinoma with no actionable mutations and PD-L1 expression of 0% diagnosed in January 2023 status post wedge resection of the right upper lobe lung mass in a setting of CABG under the care of Dr. Shyrl but the PET scan showed residual disease in the right upper lobe in addition to hypermetabolic right hilar, subcarinal and left infrahilar lymph nodes. The patient was seen by Dr. Shelah and biopsy of the right upper lobe as well as the subcarinal and left hilar lymph nodes were positive for malignancy consistent with non-small cell lung cancer, adenocarcinoma. He underwent a course of concurrent chemoradiation with weekly carboplatin  for AUC of 2 and paclitaxel  45 Mg/M2 status post 6 cycles. The patient tolerated this previous course of concurrent chemoradiation fairly well except for fatigue and mild odynophagia. The patient underwent a course of consolidation treatment with immunotherapy with Imfinzi  1500 Mg IV every 4 weeks status post 13 cycles.  He had repeat CT scan of the chest performed recently.  I personally and independently reviewed the scan and discussed the result with the patient  and his wife today.  His scan showed no concerning findings for disease recurrence or metastasis. Assessment and Plan    Lung cancer Stage IIIC (T3, N3, M0) non-small cell lung cancer, adenocarcinoma presented with right upper lobe lung mass status post right upper lobe wedge resection but no lymph node dissection.  The tumor measured 3.3 cm with visceral pleural involvement and very close resection margin less than 0.1 cm from the tumor.  This was performed on May 13, 2021 in the same setting of CABG under the  care of Dr. Shyrl.  PET scan performed in March 2023 showed a right upper lobe nodule adjacent to the wedge resection site small hypermetabolic right hilar, right subcarinal, and left infrahilar lymph nodes which could be reactive but are suspicious for nodal involvement. Lung cancer diagnosed in 2023, treated with chemoradiation and consolidation immunotherapy with durvalumab , completed in June 2024. Currently in observation phase with recent scan showing no evidence of disease progression or metastasis. He reports feeling well overall. - Schedule follow-up appointment in four months with a chest scan  Memory loss Reports short-term memory loss, noted by his wife. No current medication for memory issues. Discussion about potential need for dementia medication with primary care provider. - Discuss memory concerns with primary care provider for potential dementia medication   The patient was advised to call immediately if he has any concerning symptoms in the interval.  I discussed the assessment and treatment plan with the patient. The patient was provided an opportunity to ask questions and all were answered. The patient agreed with the plan and demonstrated an understanding of the instructions.   The patient was advised to call back or seek an in-person evaluation if the symptoms worsen or if the condition fails to improve as anticipated.  I provided 20 minutes of face-to-face video  visit time during this encounter, and > 50% was spent counseling as documented under my assessment & plan.  Sherrod MARLA Sherrod, MD 10/07/2023 10:33 AM  Disclaimer: This note was dictated with voice recognition software. Similar sounding words can inadvertently be transcribed and may not be corrected upon review.

## 2023-10-09 ENCOUNTER — Encounter (HOSPITAL_COMMUNITY): Payer: Self-pay | Admitting: Interventional Radiology

## 2023-10-10 ENCOUNTER — Other Ambulatory Visit: Payer: Self-pay | Admitting: Emergency Medicine

## 2023-10-11 ENCOUNTER — Telehealth: Payer: Self-pay | Admitting: Internal Medicine

## 2023-10-11 NOTE — Telephone Encounter (Signed)
 Called back and scheduled appointments with the patients wife.

## 2023-10-12 DIAGNOSIS — Z79899 Other long term (current) drug therapy: Secondary | ICD-10-CM | POA: Diagnosis not present

## 2023-10-12 DIAGNOSIS — E7800 Pure hypercholesterolemia, unspecified: Secondary | ICD-10-CM | POA: Diagnosis not present

## 2023-10-12 DIAGNOSIS — Z125 Encounter for screening for malignant neoplasm of prostate: Secondary | ICD-10-CM | POA: Diagnosis not present

## 2023-10-12 DIAGNOSIS — R5383 Other fatigue: Secondary | ICD-10-CM | POA: Diagnosis not present

## 2023-10-20 ENCOUNTER — Other Ambulatory Visit: Payer: Medicare HMO

## 2023-10-20 ENCOUNTER — Ambulatory Visit: Payer: Medicare HMO | Admitting: Internal Medicine

## 2023-10-25 DIAGNOSIS — I6529 Occlusion and stenosis of unspecified carotid artery: Secondary | ICD-10-CM | POA: Diagnosis not present

## 2023-12-07 ENCOUNTER — Ambulatory Visit: Admitting: Adult Health

## 2023-12-07 ENCOUNTER — Telehealth: Payer: Self-pay | Admitting: *Deleted

## 2023-12-07 NOTE — Telephone Encounter (Signed)
 Patient has not had PFT completed, ordered in May of 2025.  LVM for patient to return call to schedule PFT.  When he returns call, please schedule PFT that was ordered in May of 2025.

## 2023-12-08 ENCOUNTER — Other Ambulatory Visit: Payer: Self-pay | Admitting: Pharmacist

## 2023-12-08 ENCOUNTER — Encounter: Payer: Self-pay | Admitting: Adult Health

## 2023-12-08 ENCOUNTER — Other Ambulatory Visit (HOSPITAL_COMMUNITY): Payer: Self-pay

## 2023-12-08 ENCOUNTER — Ambulatory Visit (INDEPENDENT_AMBULATORY_CARE_PROVIDER_SITE_OTHER): Admitting: Adult Health

## 2023-12-08 VITALS — BP 110/70 | Temp 97.5°F | Ht 71.0 in | Wt 170.2 lb

## 2023-12-08 DIAGNOSIS — J439 Emphysema, unspecified: Secondary | ICD-10-CM | POA: Diagnosis not present

## 2023-12-08 DIAGNOSIS — N1831 Chronic kidney disease, stage 3a: Secondary | ICD-10-CM

## 2023-12-08 DIAGNOSIS — R053 Chronic cough: Secondary | ICD-10-CM

## 2023-12-08 DIAGNOSIS — C3491 Malignant neoplasm of unspecified part of right bronchus or lung: Secondary | ICD-10-CM

## 2023-12-08 MED ORDER — LOSARTAN POTASSIUM 25 MG PO TABS: 12.5000 mg | ORAL_TABLET | Freq: Every evening | ORAL | 3 refills | Status: AC

## 2023-12-08 NOTE — Progress Notes (Signed)
 @Patient  ID: Kevin [name], male    DOB: [birth year], 83 y.o.   MRN: [id number]  Chief Complaint  Patient presents with   Emphysema    Referring provider: Rosamond Leta NOVAK, MD  HPI: 83 yo male former smoker followed for Emphysema and Stage IIIb non small cell lung cancer s/p chemoradiation, Immunotherapy and wedge resection-ongoing surveillance Medical history significant for CAD s/p CABG, CHF, bladder cancer   TEST/EVENTS :  07/07/2021 nuclear medicine pet imaging: Hypermetabolic adenopathy within the mediastinum, right upper lobe hypermetabolic lesion. Concerning for advanced age bronchogenic carcinoma. The patient's images have been independently reviewed by me.     CT chest October 2023: Right upper lobe pulmonary nodule adjacent to right upper lobe wedge resection relatively unchanged.  Has some linear areas of groundglass opacity consistent with his radiation changes.  Mild inflammatory changes within the left lower lobe.  No other new nodules,.  No evidence of metastatic disease.  2D echo May 07, 2021 EF 4045%  12/08/2023 Follow up; COPD, Lung cancer  Discussed the use of AI scribe software for clinical note transcription with the patient, who gave verbal consent to proceed.  History of Present Illness Duan Rewerts is an 83 year old male with COPD and lung cancer who presents for a three-month follow-up.  He is not currently using the inhaler Stiolto, which was started during his last visit, as he feels his breathing is fine. He previously had a bad cough but the cough has since resolved without knowing what led to the improvement. The patient reports he does not have a problem with breathing or coughing at this time and denies coughing up blood. He confirms receiving flu shots regularly.  Says he has very active.  Patient was set up for pulmonary function testing last visit but did not have these done as he feels he does not have an underlying breathing issue.  He has a  history of stage IIIb non-small cell lung cancer status post wedge resection, chemoradiation and immunotherapy.  He has monitored by oncology with ongoing surveillance imaging.  CT chest on September 30, 2023 showed stable posttreatment changes within the lungs with no signs of local recurrent tumor or metastatic disease.   He reports remaining active and working on his yard without breathing issues.  He denies any hemoptysis or unintentional weight loss.  Previously was treated with pantoprazole  for suspected GERD component of chronic cough.  Patient says he is no longer taking this.  Denies any GERD symptoms.   Does have Claritin as needed but denies any allergy symptoms currently   Allergies  Allergen Reactions   Atorvastatin  Cough    Immunization History  Administered Date(s) Administered   INFLUENZA, HIGH DOSE SEASONAL PF 01/06/2017, 01/17/2018, 01/04/2019    Past Medical History:  Diagnosis Date   Coronary artery disease    CABG x 3 on 05/13/21, Follows w/ [facility].   Emphysema lung (HCC) 09/09/2023   History of immunotherapy    Imfinzi  1500 mg every 4 weeks, began 10/27/21.   Hx of bladder cancer    s/p multiple TURBT, follows w/ Dr. Matilda   Hyperlipidemia    Lung cancer (HCC)    RUL lung mass, s/p right uppler lobe wedge resection, Stage IIIc, non-small cell lung cancer, adenocarcinoma, Follows with [facility].   SCC (squamous cell carcinoma) 05/28/2004   top of left hand curet x3 37fu   SCC (squamous cell carcinoma) 09/13/2013   mid back tx cx3 36fu  SCC (squamous cell carcinoma) 09/06/2018   left wrist tx with bx   Squamous cell carcinoma of skin 11/16/2003   left ear tip tx curet and cautery 43fu   Status post chemoradiation    Concurrent chemoradiation with weekly carboplatin  for AUC of 2 and paclitaxel  45 mg/M2. First dose 08/11/2021. Status post 6 cycles. Last dose 09/22/21.    Tobacco History: Social History   Tobacco Use  Smoking  Status Former   Current packs/day: 0.00   Average packs/day: 1 pack/day for 30.0 years (30.0 ttl pk-yrs)   Types: Cigarettes   Start date: 04/13/1965   Quit date: 04/14/1995   Years since quitting: 28.6  Smokeless Tobacco Never   Counseling given: Not Answered   Outpatient Medications Prior to Visit  Medication Sig Dispense Refill   acetaminophen  (TYLENOL ) 500 MG tablet Take 500 mg by mouth daily as needed for moderate pain, fever or headache.     aspirin  EC 81 MG tablet Take 81 mg by mouth daily.     Calcium  Carbonate-Vit D-Min (CALCIUM  1200 PO) Take by mouth.     Cholecalciferol (D3 5000 PO) Take by mouth.     losartan  (COZAAR ) 25 MG tablet Take 0.5 tablets (12.5 mg total) by mouth every evening. 45 tablet 3   metoprolol  tartrate (LOPRESSOR ) 25 MG tablet TAKE 1 TABLET BY MOUTH TWICE A DAY 180 tablet 3   nitroGLYCERIN  (NITROSTAT ) 0.4 MG SL tablet Place 0.4 mg under the tongue every 5 (five) minutes as needed for chest pain.     rosuvastatin  (CRESTOR ) 20 MG tablet TAKE 1 TABLET BY MOUTH EVERY DAY 90 tablet 3   tadalafil  (CIALIS ) 20 MG tablet Take one 20 mg tablet at least 60 minutes prior to anticipated sexual activity to achieve erection. Do not use more than once per day. 20 tablet 1   albuterol  (PROVENTIL ) (2.5 MG/3ML) 0.083% nebulizer solution Take 3 mLs (2.5 mg total) by nebulization every 6 (six) hours as needed for wheezing or shortness of breath. (Patient not taking: Reported on 12/08/2023) 360 mL 11   albuterol  (VENTOLIN  HFA) 108 (90 Base) MCG/ACT inhaler Inhale 1-2 puffs into the lungs every 6 (six) hours as needed for wheezing or shortness of breath. (Patient not taking: Reported on 12/08/2023) 18 g 3   benzonatate  (TESSALON ) 200 MG capsule Take 1 capsule (200 mg total) by mouth 3 (three) times daily as needed. (Patient not taking: Reported on 12/08/2023) 45 capsule 2   Tiotropium Bromide-Olodaterol (STIOLTO RESPIMAT ) 2.5-2.5 MCG/ACT AERS Inhale 2 puffs into the lungs daily. (Patient  not taking: Reported on 12/08/2023) 1 each 5   Tiotropium Bromide-Olodaterol (STIOLTO RESPIMAT ) 2.5-2.5 MCG/ACT AERS Inhale 2 puffs into the lungs daily. (Patient not taking: Reported on 12/08/2023)     pantoprazole  (PROTONIX ) 40 MG tablet TAKE 1 TABLET BY MOUTH EVERY DAY (Patient not taking: Reported on 12/08/2023) 90 tablet 3   No facility-administered medications prior to visit.     Review of Systems:   Constitutional:   No  weight loss, night sweats,  Fevers, chills, fatigue, or  lassitude.  HEENT:   No headaches,  Difficulty swallowing,  Tooth/dental problems, or  Sore throat,                No sneezing, itching, ear ache, nasal congestion, post nasal drip,   CV:  No chest pain,  Orthopnea, PND, swelling in lower extremities, anasarca, dizziness, palpitations, syncope.   GI  No heartburn, indigestion, abdominal pain, nausea, vomiting, diarrhea, change in bowel habits,  loss of appetite, bloody stools.   Resp: No shortness of breath with exertion or at rest.  No excess mucus, no productive cough,  No non-productive cough,  No coughing up of blood.  No change in color of mucus.  No wheezing.  No chest wall deformity  Skin: no rash or lesions.  GU: no dysuria, change in color of urine, no urgency or frequency.  No flank pain, no hematuria   MS:  No joint pain or swelling.  No decreased range of motion.  No back pain.    Physical Exam  BP 110/70 (BP Location: Right Arm, Patient Position: Sitting)   Temp (!) 97.5 F (36.4 C) (Oral)   Ht 5' 11 (1.803 m)   Wt 170 lb 3.2 oz (77.2 kg)   SpO2 97% Comment: RA  BMI 23.74 kg/m   GEN: A/Ox3; pleasant , NAD, well nourished    HEENT:  [place]/AT,   NOSE-clear, THROAT-clear, no lesions, no postnasal drip or exudate noted.   NECK:  Supple w/ fair ROM; no JVD; normal carotid impulses w/o bruits; no thyromegaly or nodules palpated; no lymphadenopathy.    RESP  Clear  P & A; w/o, wheezes/ rales/ or rhonchi. no accessory muscle use, no dullness  to percussion  CARD:  RRR, no m/r/g, no peripheral edema, pulses intact, no cyanosis or clubbing.  GI:   Soft & nt; nml bowel sounds; no organomegaly or masses detected.   Musco: Warm bil, no deformities or joint swelling noted.   Neuro: alert, no focal deficits noted.    Skin: Warm, no lesions or rashes    Lab Results:  CBC  ProBNP No results found for: PROBNP  Imaging: No results found.  Administration History     None           No data to display          No results found for: NITRICOXIDE      Assessment & Plan:   No problem-specific Assessment & Plan notes found for this encounter. Assessment and Plan Assessment & Plan Emphysema  -currently appears to have minimal symptoms.SABRA He reports no significant dyspnea. Previous cough,  has resolved.  May remain off of Stiolto.  Use albuterol  as needed  May use Delsym and Tessalon  Perles as needed if cough returns.   Stage IIIb non-small cell lung cancer continue ongoing follow-up with oncology and surveillance imaging most recent CT chest in June showed no evidence of recurrent disease.  Allergic rhinitis-May use Claritin as needed.  Chlor tabs as needed for severe sinus drip and drainage  Chronic cough-resolved.  May remain off of pantoprazole  as previously treated for possible GERD component  Plan  Patient Instructions  Albuterol  inhaler or neb As needed   Delsym 2 tsp Twice daily  for cough , As needed   Tessalon  Three times a day  for cough As needed   Claritin 10mg  daily As needed   May use Chlorpheniramine 4mg  every 6hr as needed for drainage /cough -may make you sleepy use caution  Sips of water  to soothe throat , avoid throat clearing . NO MINTS  Continue follow up with Oncology and CT imaging.  Follow up with Dr. Shelah  or Angely Dietz NP 6 months           Madelin Stank, NP 12/08/2023

## 2023-12-08 NOTE — Patient Instructions (Addendum)
 Albuterol  inhaler or neb As needed   Delsym 2 tsp Twice daily  for cough , As needed   Tessalon  Three times a day  for cough As needed   Claritin 10mg  daily As needed   May use Chlorpheniramine 4mg  every 6hr as needed for drainage /cough -may make you sleepy use caution  Sips of water  to soothe throat , avoid throat clearing . NO MINTS  Continue follow up with Oncology and CT imaging.  Follow up with Dr. Shelah  or Mykal Kirchman NP 6 months

## 2024-01-26 DIAGNOSIS — R413 Other amnesia: Secondary | ICD-10-CM | POA: Diagnosis not present

## 2024-01-31 ENCOUNTER — Inpatient Hospital Stay: Attending: Physician Assistant

## 2024-01-31 ENCOUNTER — Ambulatory Visit (HOSPITAL_COMMUNITY)
Admission: RE | Admit: 2024-01-31 | Discharge: 2024-01-31 | Disposition: A | Discharge status: Home / Self Care | Source: Ambulatory Visit | Attending: Internal Medicine | Admitting: Internal Medicine

## 2024-01-31 DIAGNOSIS — I700 Atherosclerosis of aorta: Secondary | ICD-10-CM | POA: Diagnosis not present

## 2024-01-31 DIAGNOSIS — Z902 Acquired absence of lung [part of]: Secondary | ICD-10-CM | POA: Insufficient documentation

## 2024-01-31 DIAGNOSIS — C3490 Malignant neoplasm of unspecified part of unspecified bronchus or lung: Secondary | ICD-10-CM | POA: Diagnosis not present

## 2024-01-31 DIAGNOSIS — J90 Pleural effusion, not elsewhere classified: Secondary | ICD-10-CM | POA: Diagnosis not present

## 2024-01-31 DIAGNOSIS — Z8551 Personal history of malignant neoplasm of bladder: Secondary | ICD-10-CM | POA: Diagnosis not present

## 2024-01-31 DIAGNOSIS — J439 Emphysema, unspecified: Secondary | ICD-10-CM | POA: Diagnosis not present

## 2024-01-31 DIAGNOSIS — Z9221 Personal history of antineoplastic chemotherapy: Secondary | ICD-10-CM | POA: Insufficient documentation

## 2024-01-31 DIAGNOSIS — Z923 Personal history of irradiation: Secondary | ICD-10-CM | POA: Insufficient documentation

## 2024-01-31 DIAGNOSIS — C3411 Malignant neoplasm of upper lobe, right bronchus or lung: Secondary | ICD-10-CM | POA: Diagnosis not present

## 2024-01-31 DIAGNOSIS — Z9226 Personal history of immune checkpoint inhibitor therapy: Secondary | ICD-10-CM | POA: Insufficient documentation

## 2024-01-31 LAB — CBC WITH DIFFERENTIAL (CANCER CENTER ONLY)
Abs Immature Granulocytes: 0.04 K/uL (ref 0.00–0.07)
Basophils Absolute: 0.1 K/uL (ref 0.0–0.1)
Basophils Relative: 1 %
Eosinophils Absolute: 0.2 K/uL (ref 0.0–0.5)
Eosinophils Relative: 3 %
HCT: 39.2 % (ref 39.0–52.0)
Hemoglobin: 13.2 g/dL (ref 13.0–17.0)
Immature Granulocytes: 1 %
Lymphocytes Relative: 19 %
Lymphs Abs: 1.5 K/uL (ref 0.7–4.0)
MCH: 31.7 pg (ref 26.0–34.0)
MCHC: 33.7 g/dL (ref 30.0–36.0)
MCV: 94.2 fL (ref 80.0–100.0)
Monocytes Absolute: 0.9 K/uL (ref 0.1–1.0)
Monocytes Relative: 12 %
Neutro Abs: 5.2 K/uL (ref 1.7–7.7)
Neutrophils Relative %: 64 %
Platelet Count: 175 K/uL (ref 150–400)
RBC: 4.16 MIL/uL — ABNORMAL LOW (ref 4.22–5.81)
RDW: 13.2 % (ref 11.5–15.5)
WBC Count: 7.9 K/uL (ref 4.0–10.5)
nRBC: 0 % (ref 0.0–0.2)

## 2024-01-31 LAB — CMP (CANCER CENTER ONLY)
ALT: 8 U/L (ref 0–44)
AST: 12 U/L — ABNORMAL LOW (ref 15–41)
Albumin: 3.8 g/dL (ref 3.5–5.0)
Alkaline Phosphatase: 60 U/L (ref 38–126)
Anion gap: 5 (ref 5–15)
BUN: 20 mg/dL (ref 8–23)
CO2: 28 mmol/L (ref 22–32)
Calcium: 9.6 mg/dL (ref 8.9–10.3)
Chloride: 102 mmol/L (ref 98–111)
Creatinine: 1.35 mg/dL — ABNORMAL HIGH (ref 0.61–1.24)
GFR, Estimated: 53 mL/min — ABNORMAL LOW (ref >60)
Glucose, Bld: 91 mg/dL (ref 70–99)
Potassium: 4.8 mmol/L (ref 3.5–5.1)
Sodium: 135 mmol/L (ref 135–145)
Total Bilirubin: 0.8 mg/dL (ref 0.0–1.2)
Total Protein: 7.1 g/dL (ref 6.5–8.1)

## 2024-02-03 NOTE — Progress Notes (Signed)
 [name] OFFICE PROGRESS NOTE  Kevin Leta NOVAK, MD [address] [place] [address]  DIAGNOSIS: Stage IIIC (T3, N3, M0) non-small cell lung cancer, adenocarcinoma presented with right upper lobe lung mass status post right upper lobe wedge resection but no lymph node dissection.  The tumor measured 3.3 cm with visceral pleural involvement and very close resection margin less than 0.1 cm from the tumor.  This was performed on May 13, 2021 in the same setting of CABG under the care of Dr. Shyrl.  PET scan performed in March 2023 showed a right upper lobe nodule adjacent to the wedge resection site small hypermetabolic right hilar, right subcarinal, and left infrahilar lymph nodes which could be reactive but are suspicious for nodal involvement.   Molecular studies showed no actionable mutations and PD-L1 expression was 0%   PRIOR THERAPY: 1) Right upper lobe wedge resection under the care of Dr. Shyrl on 05/13/2021. 2) Concurrent chemoradiation with weekly carboplatin  for AUC of 2 and paclitaxel  45 Mg/M2.  First dose Aug 11, 2021.  Status post 6 cycles.  Last dose was given September 22, 2021   PRIOR THERAPY: 1) Right upper lobe wedge resection under the care of Dr. Shyrl on 05/13/2021. 2) Concurrent chemoradiation with weekly carboplatin  for AUC of 2 and paclitaxel  45 Mg/M2.  First dose Aug 11, 2021.  Status post 6 cycles.  Last dose was given September 22, 2021 3) Consolidation treatment with immunotherapy with Imfinzi  1500 Mg IV every 4 weeks. Last dose in July 2024. Status post 13 cycles.   CURRENT THERAPY: Observation   INTERVAL HISTORY: Kevin [name] 83 y.o. male returns to the clinic today for a follow-up visit accompanied by his wife. The patient was last seen in June 2025 via telehealth visit.  The patient is followed for his history of stage III non-small cell lung cancer.  He completed concurrent chemo radiation followed by 1 year of immunotherapy in July 2024. He is  currently on observation and feeling well.   Today he denies any fever, chills, or night sweats.  He had been experiencing a cough but it was discovered it was from his medication. Once his medication was discontinued, his cough resolved. He states he feels great.   No shortness of breath, cough, chest pain, or the need for inhalers or respiratory machines.  No fevers, chills, nausea, vomiting, diarrhea, or back pain. He felt cold during a recent outdoor event in [place], attributing it to the weather conditions.  He has had difficulty accessing his MyChart account due to login issues, which he has attempted to resolve with assistance.  The patient also has bladder cancer and sees Dr. Matilda for cystoscopies.    He recently had a restaging CT scan.  He is here today for evaluation and to review his scan results.    MEDICAL HISTORY: Past Medical History:  Diagnosis Date   Coronary artery disease    CABG x 3 on 05/13/21, Follows w/ [facility].   Emphysema lung (HCC) 09/09/2023   History of immunotherapy    Imfinzi  1500 mg every 4 weeks, began 10/27/21.   Hx of bladder cancer    s/p multiple TURBT, follows w/ Dr. Matilda   Hyperlipidemia    Lung cancer (HCC)    RUL lung mass, s/p right uppler lobe wedge resection, Stage IIIc, non-small cell lung cancer, adenocarcinoma, Follows with [facility].   SCC (squamous cell carcinoma) 05/28/2004   top of left hand curet x3  43fu   SCC (squamous cell carcinoma) 09/13/2013   mid back tx cx3 89fu   SCC (squamous cell carcinoma) 09/06/2018   left wrist tx with bx   Squamous cell carcinoma of skin 11/16/2003   left ear tip tx curet and cautery 71fu   Status post chemoradiation    Concurrent chemoradiation with weekly carboplatin  for AUC of 2 and paclitaxel  45 mg/M2. First dose 08/11/2021. Status post 6 cycles. Last dose 09/22/21.    ALLERGIES:  is allergic to atorvastatin .  MEDICATIONS:  Current Outpatient Medications   Medication Sig Dispense Refill   acetaminophen  (TYLENOL ) 500 MG tablet Take 500 mg by mouth daily as needed for moderate pain, fever or headache.     albuterol  (PROVENTIL ) (2.5 MG/3ML) 0.083% nebulizer solution Take 3 mLs (2.5 mg total) by nebulization every 6 (six) hours as needed for wheezing or shortness of breath. (Patient not taking: Reported on 12/08/2023) 360 mL 11   albuterol  (VENTOLIN  HFA) 108 (90 Base) MCG/ACT inhaler Inhale 1-2 puffs into the lungs every 6 (six) hours as needed for wheezing or shortness of breath. (Patient not taking: Reported on 12/08/2023) 18 g 3   aspirin  EC 81 MG tablet Take 81 mg by mouth daily.     benzonatate  (TESSALON ) 200 MG capsule Take 1 capsule (200 mg total) by mouth 3 (three) times daily as needed. (Patient not taking: Reported on 12/08/2023) 45 capsule 2   Calcium  Carbonate-Vit D-Min (CALCIUM  1200 PO) Take by mouth.     Cholecalciferol (D3 5000 PO) Take by mouth.     losartan  (COZAAR ) 25 MG tablet Take 0.5 tablets (12.5 mg total) by mouth every evening. 45 tablet 3   metoprolol  tartrate (LOPRESSOR ) 25 MG tablet TAKE 1 TABLET BY MOUTH TWICE A DAY 180 tablet 3   nitroGLYCERIN  (NITROSTAT ) 0.4 MG SL tablet Place 0.4 mg under the tongue every 5 (five) minutes as needed for chest pain.     rosuvastatin  (CRESTOR ) 20 MG tablet TAKE 1 TABLET BY MOUTH EVERY DAY 90 tablet 3   tadalafil  (CIALIS ) 20 MG tablet Take one 20 mg tablet at least 60 minutes prior to anticipated sexual activity to achieve erection. Do not use more than once per day. 20 tablet 1   Tiotropium Bromide-Olodaterol (STIOLTO RESPIMAT ) 2.5-2.5 MCG/ACT AERS Inhale 2 puffs into the lungs daily. (Patient not taking: Reported on 12/08/2023) 1 each 5   Tiotropium Bromide-Olodaterol (STIOLTO RESPIMAT ) 2.5-2.5 MCG/ACT AERS Inhale 2 puffs into the lungs daily. (Patient not taking: Reported on 12/08/2023)     No current facility-administered medications for this visit.    SURGICAL HISTORY:  Past Surgical  History:  Procedure Laterality Date   BRONCHIAL BIOPSY  07/22/2021   Procedure: BRONCHIAL BIOPSIES;  Surgeon: Shelah Lamar RAMAN, MD;  Location: [facility] ENDOSCOPY;  Service: Cardiopulmonary;;   BRONCHIAL BRUSHINGS  07/22/2021   Procedure: BRONCHIAL BRUSHINGS;  Surgeon: Shelah Lamar RAMAN, MD;  Location: [facility] ENDOSCOPY;  Service: Cardiopulmonary;;   BRONCHIAL NEEDLE ASPIRATION BIOPSY  07/22/2021   Procedure: BRONCHIAL NEEDLE ASPIRATION BIOPSIES;  Surgeon: Shelah Lamar RAMAN, MD;  Location: [facility] ENDOSCOPY;  Service: Cardiopulmonary;;   CHOLECYSTECTOMY N/A 02/15/2022   Procedure: LAPAROSCOPIC CHOLECYSTECTOMY;  Surgeon: Kallie Manuelita BROCKS, MD;  Location: [facility] ORS;  Service: General;  Laterality: N/A;   CORONARY ARTERY BYPASS GRAFT N/A 05/13/2021   Procedure: CORONARY ARTERY BYPASS GRAFTING (CABG) TIMES 3  , ON PUMP, USING LEFT INTERNAL MAMMARY ARTERY AND RIGHT GREATER SAPHENOUS VEIN HARVESTED ENDOSCOPICALLY;  Surgeon: Shyrl Linnie KIDD, MD;  Location: [facility] OR;  Service: Open Heart Surgery;  Laterality: N/A;   CYSTOSCOPY WITH LITHOLAPAXY N/A 12/10/2022   Procedure: CYSTOSCOPY WITH LITHOLAPAXY and FULGURATION;  Surgeon: Matilda Senior, MD;  Location: [facility];  Service: Urology;  Laterality: N/A;   ENDOVEIN HARVEST OF GREATER SAPHENOUS VEIN Right 05/13/2021   Procedure: ENDOVEIN HARVEST OF GREATER SAPHENOUS VEIN;  Surgeon: Shyrl Linnie KIDD, MD;  Location: [facility] OR;  Service: Open Heart Surgery;  Laterality: Right;   epidural     back pain   HOLMIUM LASER APPLICATION N/A 12/10/2022   Procedure: HOLMIUM LASER APPLICATION;  Surgeon: Matilda Senior, MD;  [facility];  Service: Urology;  Laterality: N/A;   IR CT SPINE LTD  06/18/2020   IR KYPHO LUMBAR INC FX REDUCE BONE BX UNI/BIL CANNULATION INC/IMAGING  06/18/2020   LEFT HEART CATH AND CORONARY ANGIOGRAPHY N/A 05/06/2021   Procedure: LEFT HEART CATH AND CORONARY ANGIOGRAPHY;  Surgeon: Ladona Heinz, MD;  Location: [facility] INVASIVE CV LAB;   Service: Cardiovascular;  Laterality: N/A;   LUNG BIOPSY Right 05/13/2021   Procedure: RIGHT LUNG BIOPSY;  Surgeon: Shyrl Linnie KIDD, MD;  Location: [facility] OR;  Service: Open Heart Surgery;  Laterality: Right;   MINOR HEMORRHOIDECTOMY     SHOULDER SURGERY     TEE WITHOUT CARDIOVERSION N/A 05/13/2021   Procedure: TRANSESOPHAGEAL ECHOCARDIOGRAM (TEE);  Surgeon: Shyrl Linnie KIDD, MD;  Location: [facility] OR;  Service: Open Heart Surgery;  Laterality: N/A;   TRANSURETHRAL RESECTION OF BLADDER TUMOR  2014   TRANSURETHRAL RESECTION OF BLADDER TUMOR N/A 05/19/2018   Procedure: TRANSURETHRAL RESECTION OF BLADDER TUMOR (TURBT) AND (INTRAVESICAL GEMCITABINE  INSTILLED IN BLADDER IN PACU) ;  Surgeon: Matilda Senior, MD;  Location: [facility] ORS;  Service: Urology;  Laterality: N/A;  30 MINS   VIDEO BRONCHOSCOPY WITH ENDOBRONCHIAL ULTRASOUND Bilateral 07/22/2021   Procedure: VIDEO BRONCHOSCOPY WITH ENDOBRONCHIAL ULTRASOUND;  Surgeon: Shelah Lamar RAMAN, MD;  Location: [facility] ENDOSCOPY;  Service: Cardiopulmonary;  Laterality: Bilateral;    REVIEW OF SYSTEMS:   Review of Systems  Constitutional: Negative for appetite change, chills, fatigue, fever and unexpected weight change.  HENT:   Negative for mouth sores, nosebleeds, sore throat and trouble swallowing.   Eyes: Negative for eye problems and icterus.  Respiratory: Negative for cough, hemoptysis, shortness of breath and wheezing.   Cardiovascular: Negative for chest pain and leg swelling.  Gastrointestinal: Negative for abdominal pain, constipation, diarrhea, nausea and vomiting.  Genitourinary: Negative for bladder incontinence, difficulty urinating, dysuria, frequency and hematuria.   Musculoskeletal: Negative for gait problem, neck pain and neck stiffness.  Skin: Negative for itching and rash.  Neurological: Negative for dizziness, extremity weakness, gait problem, headaches, light-headedness and seizures.  Hematological: Negative for adenopathy. Does not  bruise/bleed easily.  Psychiatric/Behavioral: Negative for confusion, depression and sleep disturbance. The patient is not nervous/anxious.     PHYSICAL EXAMINATION:  Blood pressure 106/63, pulse 72, temperature 98 F (36.7 C), temperature source Temporal, resp. rate 16, height 5' 11 (1.803 m), weight 171 lb (77.6 kg), SpO2 100%.  ECOG PERFORMANCE STATUS: 1  Physical Exam  Constitutional: Oriented to person, place, and time and well-developed, well-nourished, and in no distress. HENT:  Head: Normocephalic and atraumatic.  Mouth/Throat: Oropharynx is clear and moist. No oropharyngeal exudate.  Eyes: Conjunctivae are normal. Right eye exhibits no discharge. Left eye exhibits no discharge. No scleral icterus.  Neck: Normal range of motion. Neck supple.  Cardiovascular: Normal rate, regular rhythm, normal heart sounds and intact distal pulses.   Pulmonary/Chest: Effort normal  and breath sounds normal. No respiratory distress. No wheezes. No rales.  Abdominal: Soft. Bowel sounds are normal. Exhibits no distension and no mass. There is no tenderness.  Musculoskeletal: Normal range of motion. Exhibits no edema.  Lymphadenopathy:    No cervical adenopathy.  Neurological: Alert and oriented to person, place, and time. Exhibits normal muscle tone. Gait normal. Coordination normal.  Skin: Skin is warm and dry. No rash noted. Not diaphoretic. No erythema. No pallor.  Psychiatric: Mood, memory and judgment normal.  Vitals reviewed.  LABORATORY DATA: Lab Results  Component Value Date   WBC 7.9 01/31/2024   HGB 13.2 01/31/2024   HCT 39.2 01/31/2024   MCV 94.2 01/31/2024   PLT 175 01/31/2024      Chemistry      Component Value Date/Time   NA 135 01/31/2024 1030   NA 139 05/01/2021 1146   K 4.8 01/31/2024 1030   CL 102 01/31/2024 1030   CO2 28 01/31/2024 1030   BUN 20 01/31/2024 1030   BUN 20 05/01/2021 1146   CREATININE 1.35 (H) 01/31/2024 1030      Component Value Date/Time    CALCIUM  9.6 01/31/2024 1030   ALKPHOS 60 01/31/2024 1030   AST 12 (L) 01/31/2024 1030   ALT 8 01/31/2024 1030   BILITOT 0.8 01/31/2024 1030       RADIOGRAPHIC STUDIES:  CT Chest Wo Contrast Result Date: 02/02/2024 CLINICAL DATA:  Non-small-cell lung cancer restaging * Tracking Code: BO * EXAM: CT CHEST WITHOUT CONTRAST TECHNIQUE: Multidetector CT imaging of the chest was performed following the standard protocol without IV contrast. RADIATION DOSE REDUCTION: This exam was performed according to the departmental dose-optimization program which includes automated exposure control, adjustment of the mA and/or kV according to patient size and/or use of iterative reconstruction technique. COMPARISON:  09/30/2023 FINDINGS: Cardiovascular: Aortic atherosclerosis. Normal heart size. Three-vessel coronary artery calcifications. No pericardial effusion. Mediastinum/Nodes: No enlarged mediastinal, hilar, or axillary lymph nodes. Thyroid  gland, trachea, and esophagus demonstrate no significant findings. Lungs/Pleura: Unchanged post treatment appearance of the chest with wedge resection and dense fibrotic consolidation and volume loss of the right upper lobe (series 8, image 50) and paramedian radiation fibrosis of the left lung (series 8, image 80). Moderate underlying paraseptal emphysema. Unchanged trace, chronic right pleural effusion. Upper Abdomen: No acute abnormality. Small nonobstructive calculus of the superior pole of the right kidney. Musculoskeletal: No chest wall abnormality. No acute osseous findings. IMPRESSION: 1. Unchanged post treatment/post radiation appearance of the chest with wedge resection, dense fibrotic consolidation and volume loss of the right upper lobe as well as paramedian radiation fibrosis of the left lung. 2. No noncontrast evidence of recurrent or metastatic disease in the chest. 3. Emphysema. 4. Coronary artery disease. Aortic Atherosclerosis (ICD10-I70.0) and Emphysema  (ICD10-J43.9). Electronically Signed   By: Marolyn JONETTA Jaksch M.D.   On: 02/02/2024 15:53     ASSESSMENT/PLAN:  Is a very pleasant 83 year old Caucasian male diagnosed with stage IIIc (T3, N3, M0) non-small cell lung cancer, adenocarcinoma.  He has no actionable mutations and his PD-L1 expression is 0%.  Was diagnosed in January 2023.  The patient is status post wedge resection of the right upper lobe lung mass in a setting of CABG under the care of Dr. Shyrl but the PET scan showed residual disease in the right upper lobe in addition to hypermetabolic right hilar, subcarinal and left infrahilar lymph nodes.   The patient was seen by Dr. Shelah and biopsy of the right upper lobe  as well as the subcarinal and left hilar lymph nodes were positive for malignancy consistent with non-small cell lung cancer, adenocarcinoma. He underwent a course of concurrent chemoradiation with weekly carboplatin  for AUC of 2 and paclitaxel  45 Mg/M2 status post 6 cycles. The patient tolerated this previous course of concurrent chemoradiation fairly well except for fatigue and mild odynophagia. The patient underwent a course of consolidation treatment with immunotherapy with Imfinzi  1500 Mg IV every 4 weeks status post 13 cycles. This was completed in July 2024.   He is currently on observation and feeling well.  The patient was seen with Dr. Sherrod today.  Dr. Sherrod personally and independently reviewed the scan and discussed results with the patient today.  The scan showed no evidence of disease progression.  Dr. Sherrod recommends he continue on observation with a repeat CT in 6 months.   We will see him about 1 week later to review the results in the office.   For the diagnosed bladder cancer, he is being evaluated by urology by Dr. Matilda.  Send MyChart activation code to cell phone.  The patient was advised to call immediately if she has any concerning symptoms in the interval. The patient voices  understanding of current disease status and treatment options and is in agreement with the current care plan. All questions were answered. The patient knows to call the clinic with any problems, questions or concerns. We can certainly see the patient much sooner if necessary    Orders Placed This Encounter  Procedures   CT Chest Wo Contrast    Standing Status:   Future    Expected Date:   08/07/2024    Expiration Date:   02/06/2025    Preferred imaging location?:   [facility]   CBC with Differential (Cancer Center Only)    Standing Status:   Future    Expected Date:   08/07/2024    Expiration Date:   02/06/2025   CMP (Cancer Center only)    Standing Status:   Future    Expected Date:   08/07/2024    Expiration Date:   02/06/2025    Parneet Glantz L Lamyra Malcolm, PA-C 02/07/24  ADDENDUM: Hematology/Oncology Attending:  I had a face-to-face encounter with the patient today.  I reviewed his records, lab, scan and recommended his care plan.  This is a very pleasant 83 years old white male with stage IIIc non-small cell lung cancer, adenocarcinoma diagnosed in January 2023 with evidence of disease progression in March 2023 after right upper lobe wedge resection.  He underwent a course of concurrent chemoradiation with weekly carboplatin  and paclitaxel  followed by 1 year of consolidation treatment with immunotherapy with durvalumab  completed in July 2024 and has been in observation since that time.  The patient had repeat CT scan of the chest performed recently.  I personally and independently reviewed the scan and discussed the result with the patient and his wife.  His scan showed no concerning findings for disease progression. I recommended for him to continue on observation with repeat CT scan of the chest in 6 months. The patient was advised to call immediately if he has any other concerning symptoms in the interval. Disclaimer: This note was dictated with voice recognition software.  Similar sounding words can inadvertently be transcribed and may be missed upon review. Sherrod MARLA Sherrod, MD

## 2024-02-07 ENCOUNTER — Inpatient Hospital Stay: Admitting: Physician Assistant

## 2024-02-07 VITALS — BP 106/63 | HR 72 | Temp 98.0°F | Resp 16 | Ht 71.0 in | Wt 171.0 lb

## 2024-02-07 DIAGNOSIS — Z9221 Personal history of antineoplastic chemotherapy: Secondary | ICD-10-CM | POA: Diagnosis not present

## 2024-02-07 DIAGNOSIS — Z9226 Personal history of immune checkpoint inhibitor therapy: Secondary | ICD-10-CM | POA: Diagnosis not present

## 2024-02-07 DIAGNOSIS — C3491 Malignant neoplasm of unspecified part of right bronchus or lung: Secondary | ICD-10-CM | POA: Diagnosis not present

## 2024-02-07 DIAGNOSIS — Z902 Acquired absence of lung [part of]: Secondary | ICD-10-CM | POA: Diagnosis not present

## 2024-02-07 DIAGNOSIS — C3411 Malignant neoplasm of upper lobe, right bronchus or lung: Secondary | ICD-10-CM | POA: Diagnosis not present

## 2024-02-07 DIAGNOSIS — Z8551 Personal history of malignant neoplasm of bladder: Secondary | ICD-10-CM | POA: Diagnosis not present

## 2024-02-07 DIAGNOSIS — Z923 Personal history of irradiation: Secondary | ICD-10-CM | POA: Diagnosis not present

## 2024-04-24 ENCOUNTER — Other Ambulatory Visit: Payer: Self-pay

## 2024-04-24 DIAGNOSIS — I2510 Atherosclerotic heart disease of native coronary artery without angina pectoris: Secondary | ICD-10-CM

## 2024-04-24 MED ORDER — METOPROLOL TARTRATE 25 MG PO TABS: 25.0000 mg | ORAL_TABLET | Freq: Two times a day (BID) | ORAL | 3 refills | Status: AC

## 2024-05-15 ENCOUNTER — Encounter

## 2024-06-14 ENCOUNTER — Encounter

## 2024-06-27 ENCOUNTER — Other Ambulatory Visit: Admitting: Urology

## 2024-08-07 ENCOUNTER — Inpatient Hospital Stay

## 2024-08-07 ENCOUNTER — Ambulatory Visit (HOSPITAL_COMMUNITY)

## 2024-08-14 ENCOUNTER — Inpatient Hospital Stay: Admitting: Internal Medicine
